# Patient Record
Sex: Female | Born: 1955 | Race: White | Hispanic: No | Marital: Married | Smoking: Never smoker
Health system: Southern US, Community
[De-identification: ages and names within clinical notes are randomized; demographics above are authoritative.]

## PROBLEM LIST (undated history)

## (undated) DIAGNOSIS — R5383 Other fatigue: Secondary | ICD-10-CM

## (undated) DIAGNOSIS — K649 Unspecified hemorrhoids: Secondary | ICD-10-CM

## (undated) DIAGNOSIS — I10 Essential (primary) hypertension: Secondary | ICD-10-CM

## (undated) DIAGNOSIS — N189 Chronic kidney disease, unspecified: Secondary | ICD-10-CM

## (undated) DIAGNOSIS — K6289 Other specified diseases of anus and rectum: Secondary | ICD-10-CM

## (undated) DIAGNOSIS — D649 Anemia, unspecified: Secondary | ICD-10-CM

## (undated) DIAGNOSIS — K219 Gastro-esophageal reflux disease without esophagitis: Secondary | ICD-10-CM

## (undated) DIAGNOSIS — K449 Diaphragmatic hernia without obstruction or gangrene: Secondary | ICD-10-CM

## (undated) DIAGNOSIS — J189 Pneumonia, unspecified organism: Secondary | ICD-10-CM

## (undated) DIAGNOSIS — I773 Arterial fibromuscular dysplasia: Secondary | ICD-10-CM

## (undated) DIAGNOSIS — I729 Aneurysm of unspecified site: Secondary | ICD-10-CM

## (undated) DIAGNOSIS — T783XXA Angioneurotic edema, initial encounter: Secondary | ICD-10-CM

## (undated) DIAGNOSIS — M1990 Unspecified osteoarthritis, unspecified site: Secondary | ICD-10-CM

## (undated) DIAGNOSIS — C801 Malignant (primary) neoplasm, unspecified: Secondary | ICD-10-CM

## (undated) DIAGNOSIS — G629 Polyneuropathy, unspecified: Secondary | ICD-10-CM

## (undated) DIAGNOSIS — R51 Headache: Secondary | ICD-10-CM

## (undated) DIAGNOSIS — R002 Palpitations: Secondary | ICD-10-CM

## (undated) DIAGNOSIS — R519 Headache, unspecified: Secondary | ICD-10-CM

## (undated) DIAGNOSIS — K625 Hemorrhage of anus and rectum: Secondary | ICD-10-CM

## (undated) HISTORY — DX: Gastro-esophageal reflux disease without esophagitis: K21.9

## (undated) HISTORY — DX: Anemia, unspecified: D64.9

## (undated) HISTORY — DX: Other fatigue: R53.83

## (undated) HISTORY — DX: Unspecified hemorrhoids: K64.9

## (undated) HISTORY — DX: Angioneurotic edema, initial encounter: T78.3XXA

## (undated) HISTORY — DX: Headache, unspecified: R51.9

## (undated) HISTORY — DX: Unspecified osteoarthritis, unspecified site: M19.90

## (undated) HISTORY — DX: Palpitations: R00.2

## (undated) HISTORY — DX: Arterial fibromuscular dysplasia: I77.3

## (undated) HISTORY — DX: Diaphragmatic hernia without obstruction or gangrene: K44.9

## (undated) HISTORY — DX: Headache: R51

## (undated) HISTORY — DX: Malignant (primary) neoplasm, unspecified: C80.1

## (undated) HISTORY — DX: Essential (primary) hypertension: I10

## (undated) HISTORY — DX: Hemorrhage of anus and rectum: K62.5

## (undated) HISTORY — PX: OTHER SURGICAL HISTORY: SHX169

## (undated) HISTORY — DX: Other specified diseases of anus and rectum: K62.89

---

## 1993-11-01 HISTORY — PX: TUBAL LIGATION: SHX77

## 2002-07-30 DIAGNOSIS — M4120 Other idiopathic scoliosis, site unspecified: Secondary | ICD-10-CM | POA: Insufficient documentation

## 2002-07-30 DIAGNOSIS — M719 Bursopathy, unspecified: Secondary | ICD-10-CM | POA: Insufficient documentation

## 2002-07-30 HISTORY — DX: Other idiopathic scoliosis, site unspecified: M41.20

## 2003-07-17 DIAGNOSIS — E1142 Type 2 diabetes mellitus with diabetic polyneuropathy: Secondary | ICD-10-CM | POA: Insufficient documentation

## 2003-07-17 HISTORY — DX: Type 2 diabetes mellitus with diabetic polyneuropathy: E11.42

## 2003-12-23 DIAGNOSIS — G609 Hereditary and idiopathic neuropathy, unspecified: Secondary | ICD-10-CM | POA: Insufficient documentation

## 2004-12-30 ENCOUNTER — Ambulatory Visit: Payer: Self-pay | Admitting: Ophthalmology

## 2006-02-17 ENCOUNTER — Ambulatory Visit: Payer: Self-pay | Admitting: Unknown Physician Specialty

## 2007-02-20 ENCOUNTER — Ambulatory Visit: Payer: Self-pay | Admitting: Family Medicine

## 2007-03-23 ENCOUNTER — Ambulatory Visit: Payer: Self-pay | Admitting: Ophthalmology

## 2007-03-29 ENCOUNTER — Ambulatory Visit: Payer: Self-pay | Admitting: Ophthalmology

## 2008-03-01 DIAGNOSIS — G4700 Insomnia, unspecified: Secondary | ICD-10-CM | POA: Insufficient documentation

## 2008-03-06 DIAGNOSIS — E538 Deficiency of other specified B group vitamins: Secondary | ICD-10-CM | POA: Insufficient documentation

## 2008-03-06 DIAGNOSIS — E559 Vitamin D deficiency, unspecified: Secondary | ICD-10-CM | POA: Insufficient documentation

## 2008-03-06 DIAGNOSIS — D509 Iron deficiency anemia, unspecified: Secondary | ICD-10-CM | POA: Insufficient documentation

## 2008-03-20 ENCOUNTER — Ambulatory Visit: Payer: Self-pay | Admitting: Family Medicine

## 2008-11-01 HISTORY — PX: OTHER SURGICAL HISTORY: SHX169

## 2009-07-11 ENCOUNTER — Ambulatory Visit: Payer: Self-pay | Admitting: Family Medicine

## 2009-08-05 ENCOUNTER — Encounter: Payer: Self-pay | Admitting: Cardiovascular Disease

## 2010-04-17 DIAGNOSIS — R202 Paresthesia of skin: Secondary | ICD-10-CM | POA: Insufficient documentation

## 2010-04-22 ENCOUNTER — Ambulatory Visit: Payer: Self-pay | Admitting: Family Medicine

## 2010-04-23 DIAGNOSIS — I809 Phlebitis and thrombophlebitis of unspecified site: Secondary | ICD-10-CM | POA: Insufficient documentation

## 2010-08-28 ENCOUNTER — Encounter: Payer: Self-pay | Admitting: Cardiovascular Disease

## 2010-08-28 LAB — HM PAP SMEAR: HM PAP: NEGATIVE

## 2010-10-06 ENCOUNTER — Ambulatory Visit: Payer: Self-pay | Admitting: Family Medicine

## 2010-12-04 ENCOUNTER — Ambulatory Visit (INDEPENDENT_AMBULATORY_CARE_PROVIDER_SITE_OTHER): Payer: 59 | Admitting: Cardiovascular Disease

## 2010-12-04 ENCOUNTER — Encounter: Payer: Self-pay | Admitting: Cardiovascular Disease

## 2010-12-04 ENCOUNTER — Ambulatory Visit: Admit: 2010-12-04 | Payer: Self-pay | Admitting: Cardiovascular Disease

## 2010-12-04 DIAGNOSIS — R0602 Shortness of breath: Secondary | ICD-10-CM

## 2010-12-04 DIAGNOSIS — E785 Hyperlipidemia, unspecified: Secondary | ICD-10-CM

## 2010-12-04 DIAGNOSIS — R9431 Abnormal electrocardiogram [ECG] [EKG]: Secondary | ICD-10-CM | POA: Insufficient documentation

## 2010-12-04 DIAGNOSIS — R002 Palpitations: Secondary | ICD-10-CM | POA: Insufficient documentation

## 2010-12-09 NOTE — Assessment & Plan Note (Signed)
Summary: [facility]   Visit Type:  Initial Consult Primary Provider:  Dr. Lorie Phenix  CC:  Self referral. c/o palpitations and SOB. 3 abnormal EKGs. Denies chest pain. Marland Kitchen  History of Present Illness: [name] is a very pleasant 55 year old woman, patient of Dr. Elease Hashimoto, with mild obesity, diabetes type 2 for the past 10 years, history of abnormal EKG, palpitations and tachycardia who presents for evaluation of palpitations and abnormal EKG. she reports that her palpitations have been more bothersome recently and she is also concerned about her family history of peripheral vascular disease and wanted to be evaluated.  She states that her mother has died from congestive heart failure in her 10s. Father is alive in his 63s and recently had carotid disease and lower extremity arterial disease procedures. She is very nervous about having disease herself.  She is active, walks her dog who is blind and also walked with her husband. Her weight has been increasing and is up 20 pounds from her best recent weight. Her sugar numbers have been well controlled with hemoglobin A1c's in the 6.3-6.8 range per her report. She is not number her cholesterol though has a triglyceride in the 170s she believes. She has been told in the past that her cholesterol has been adequate.  She has a remote history of palpitations. Workup including EKG where she was told this was abnormal. She had a stress test in 2003 that was normal showing no ischemia. She continues to have periods at night time when her heart beat hard with rare palpitations. She has very mild shortness of breath periodically with walking.  EKG today shows normal sinus rhythm with rate 66 beats per minute with poor R-wave progression through the precordial leads otherwise no significant ST or T wave changes  Preventive Screening-Counseling & Management  Alcohol-Tobacco     Smoking Status: never  Caffeine-Diet-Exercise     Does Patient  Exercise: yes      Drug Use:  no.    Current Medications (verified): 1)  Vitamin B-12 Injection .... Monthly 2)  Vitamin D 1000 Unit Tabs (Cholecalciferol) .Marland Kitchen.. 1 Tablet Once Daily 3)  Ferrocite 324 Mg Tabs (Ferrous Fumarate) .Marland Kitchen.. 1 Tablet Two Times A Day 4)  Fish Oil 1200 Mg Caps (Omega-3 Fatty Acids) .Marland Kitchen.. 1 Tablet Once Daily 5)  Actos 45 Mg Tabs (Pioglitazone Hcl) .Marland Kitchen.. 1 Tablet Daily 6)  Lyrica 100 Mg Caps (Pregabalin) .Marland Kitchen.. 1 Tablet Three Times A Day 7)  Metoprolol Succinate 25 Mg Xr24h-Tab (Metoprolol Succinate) .Marland Kitchen.. 1 Tablet Once Daily 8)  Lisinopril-Hydrochlorothiazide 20-12.5 Mg Tabs (Lisinopril-Hydrochlorothiazide) .Marland Kitchen.. 1 Tablet Once Daily 9)  Zolpidem Tartrate 5 Mg Tabs (Zolpidem Tartrate) .Marland Kitchen.. 1 Tablet At Bedtime As Needed 10)  Zegerid 40-1100 Mg Caps (Omeprazole-Sodium Bicarbonate) .Marland Kitchen.. 1 Tablet Once Daily 11)  Aspir-Low 81 Mg Tbec (Aspirin) .Marland Kitchen.. 1 Tablet Once Daily  Allergies (verified): No Known Drug Allergies  Past History:  Family History: Last updated: 12/04/2010 Father: 82-carotid and leg blockages-living Mother: 67-CHF  Social History: Last updated: 12/04/2010 Full Time Married  Tobacco Use - No.  Alcohol Use - no Regular Exercise - yes-walking Drug Use - no  Risk Factors: Exercise: yes (12/04/2010)  Risk Factors: Smoking Status: never (12/04/2010)  Past Medical History: Anemia  Fatigue Hiatal Hernia GERD Diabetes  Past Surgical History: Tubal ligation right and left eye cataract surgery  Family History: Father: 82-carotid and leg blockages-living Mother: 67-CHF  Social History: Full Time Married  Tobacco Use - No.  Alcohol Use -  no Regular Exercise - yes-walking Drug Use - no Smoking Status:  never Does Patient Exercise:  yes Drug Use:  no  Review of Systems  The patient denies fever, weight loss, weight gain, vision loss, decreased hearing, hoarseness, chest pain, syncope, dyspnea on exertion, peripheral edema, prolonged cough,  abdominal pain, incontinence, muscle weakness, depression, and enlarged lymph nodes.         palpitations, rare SOB with exertion, sometimes at rest, "hard beats"  Vital Signs:  Patient profile:   55 year old female Height:      68 inches Weight:      230.50 pounds BMI:     35.17 Pulse rate:   66 / minute BP sitting:   128 / 75  (left arm) Cuff size:   large  Vitals Entered By: Lysbeth Galas CMA (December 04, 2010 11:06 AM)  Physical Exam  General:  Well developed, well nourished, in no acute distress. Mildly obese Head:  normocephalic and atraumatic Neck:  Neck supple, no JVD. No masses, thyromegaly or abnormal cervical nodes. Lungs:  Clear bilaterally to auscultation and percussion. Heart:  Non-displaced PMI, chest non-tender; regular rate and rhythm, S1, S2 without murmurs, rubs or gallops. Carotid upstroke normal, no bruit. Pedals normal pulses. No edema, no varicosities. Abdomen:  Bowel sounds positive; abdomen soft and non-tender without masses Msk:  Back normal, normal gait. Muscle strength and tone normal. Pulses:  pulses normal in all 4 extremities Extremities:  No clubbing or cyanosis. Neurologic:  Alert and oriented x 3. Skin:  Intact without lesions or rashes. Psych:  Normal affect.   Impression & Recommendations:  Problem # 1:  ABNORMAL EKG (ICD-794.31) she does have subtle changes on her EKG though essentially it is normal. She did have a stress test in 2003 given her abnormal EKG and this was normal. She is relatively asymptomatic. No further testing needed. We do not need to repeat a stress test. .   Her updated medication list for this problem includes:    Metoprolol Succinate 25 Mg Xr24h-tab (Metoprolol succinate) .Marland Kitchen... 1 tablet once daily    Lisinopril-hydrochlorothiazide 20-12.5 Mg Tabs (Lisinopril-hydrochlorothiazide) .Marland Kitchen... 1 tablet once daily    Aspir-low 81 Mg Tbec (Aspirin) .Marland Kitchen... 1 tablet once daily  Problem # 2:  PALPITATIONS (ICD-785.1) We did  discuss her palpitations with her. This is likely either PVCs or APCs. She is on a beta blocker. She is not particular he interested in increasing the dose but I did suggest that she could take an additional half dose p.r.n. for breakthrough palpitations. If she starts to have more episodes that are more bothersome, we could order a Holter monitor.  Her updated medication list for this problem includes:    Metoprolol Succinate 25 Mg Xr24h-tab (Metoprolol succinate) .Marland Kitchen... 1 tablet once daily    Lisinopril-hydrochlorothiazide 20-12.5 Mg Tabs (Lisinopril-hydrochlorothiazide) .Marland Kitchen... 1 tablet once daily    Aspir-low 81 Mg Tbec (Aspirin) .Marland Kitchen... 1 tablet once daily  Problem # 3:  HYPERLIPIDEMIA-MIXED (ICD-272.4) As she is a diabetic, and has a family history on her mother's side of congestive heart failure, we will closely at her cholesterol. Ideally LDL should be less than 100. We have encouraged her to exercise, decrease her weight.  Problem # 4:  DYSPNEA (ICD-786.05) She does have some very mild shortness of breath sometimes with exertion. This could be secondary to deconditioning. Clinical exam is essentially benign with no signs of heart failure. It is sporadic likely not secondary to underlying ischemia. We have suggested  she increase her exercise and conditioning.  Her updated medication list for this problem includes:    Metoprolol Succinate 25 Mg Xr24h-tab (Metoprolol succinate) .Marland Kitchen... 1 tablet once daily    Lisinopril-hydrochlorothiazide 20-12.5 Mg Tabs (Lisinopril-hydrochlorothiazide) .Marland Kitchen... 1 tablet once daily    Aspir-low 81 Mg Tbec (Aspirin) .Marland Kitchen... 1 tablet once daily  Other Orders: EKG w/ Interpretation (93000)

## 2010-12-17 NOTE — Letter (Signed)
Summary: Medical Record Release  Medical Record Release   Imported By: Harlon Flor 12/11/2010 16:13:06  _____________________________________________________________________  External Attachment:    Type:   Image     Comment:   External Document

## 2011-01-12 NOTE — Letter (Signed)
Summary: [facility] Office Visit Note   [facility] Office Visit Note   Imported By: Roderic Ovens 01/04/2011 15:14:24  _____________________________________________________________________  External Attachment:    Type:   Image     Comment:   External Document

## 2011-02-01 ENCOUNTER — Encounter: Payer: Self-pay | Admitting: Cardiovascular Disease

## 2011-05-18 ENCOUNTER — Encounter: Payer: Self-pay | Admitting: Cardiovascular Disease

## 2011-10-25 ENCOUNTER — Ambulatory Visit: Payer: Self-pay | Admitting: Family Medicine

## 2011-10-25 LAB — HM MAMMOGRAPHY

## 2012-02-25 ENCOUNTER — Encounter (INDEPENDENT_AMBULATORY_CARE_PROVIDER_SITE_OTHER): Payer: Self-pay | Admitting: General Surgery

## 2012-02-25 ENCOUNTER — Ambulatory Visit (INDEPENDENT_AMBULATORY_CARE_PROVIDER_SITE_OTHER): Payer: 59 | Admitting: General Surgery

## 2012-02-25 VITALS — BP 118/66 | HR 70 | Temp 96.9°F | Resp 18 | Ht 68.0 in | Wt 246.5 lb

## 2012-02-25 DIAGNOSIS — K648 Other hemorrhoids: Secondary | ICD-10-CM

## 2012-02-25 NOTE — Patient Instructions (Signed)

## 2012-02-25 NOTE — Progress Notes (Signed)
Patient ID: Melody Brooks, female   DOB: 1956/06/19, 56 y.o.   MRN: [id number]  Chief Complaint  Patient presents with  . Rectal Bleeding  . Pain    HPI Melody Brooks is a 56 y.o. female.  Self referred HPI This is a 56 year old female who had a colonoscopy a couple of years ago was noted to have a polyp that appeared around her anus. Her remainder of her colonoscopy apparently was normal as she was recommended to follow up in 10 years. She had a tagged it eventually was removed by her dermatologist. This took a while to heal. Since then she's had intermittent bleeding that has been noted to be worse with hard stools. She also also had some discomfort in her rectum. She denies any itching. She denies any real masses that she can feel in this region either. She comes in today and she thinks that his hearing is not healed and she continues to have the bright red blood.  Past Medical History  Diagnosis Date  . Anemia   . Fatigue   . Hiatal hernia   . GERD (gastroesophageal reflux disease)   . Diabetes mellitus   . Arthritis   . Hypertension   . Constipation   . Palpitations   . Rectal bleeding   . Rectal pain   . Generalized headaches     Past Surgical History  Procedure Date  . Right and left eye cataract surgery 2006, 2008    right in 2006 and left 2008  . Tubal ligation 1995  . Colonoscopy 2010    Family History  Problem Relation Age of Onset  . Heart failure Mother   . Cancer Father     prostate  . Cancer Maternal Aunt     colon  . Cancer Paternal Grandmother     stomach    Social History History  Substance Use Topics  . Smoking status: Never Smoker   . Smokeless tobacco: Never Used   Comment: tobacco use- no  . Alcohol Use: No    No Known Allergies  Current Outpatient Prescriptions  Medication Sig Dispense Refill  . aspirin 81 MG tablet Take 81 mg by mouth daily.      Marland Kitchen LORazepam (ATIVAN) 2 MG tablet Take 2 mg by mouth at bedtime as needed.      .  metoprolol tartrate (LOPRESSOR) 25 MG tablet Take 25 mg by mouth 2 (two) times daily.      . sitaGLIPtan-metformin (JANUMET) 50-1000 MG per tablet Take 1 tablet by mouth 2 (two) times daily with a meal.      . cholecalciferol (VITAMIN D) 1000 UNITS tablet Take 1,000 Units by mouth daily.        . Cyanocobalamin (VITAMIN B-12 IJ) Inject as directed every 30 (thirty) days.        . ferrous fumarate (FERROCITE) 325 (106 FE) MG TABS Take 1 tablet by mouth 2 (two) times daily.        Marland Kitchen lisinopril-hydrochlorothiazide (PRINZIDE,ZESTORETIC) 20-12.5 MG per tablet Take 1 tablet by mouth daily.        . Omega-3 Fatty Acids (FISH OIL) 1200 MG CAPS Take by mouth daily.        Marland Kitchen omeprazole-sodium bicarbonate (ZEGERID) 40-1100 MG per capsule Take 1 capsule by mouth daily.        . pioglitazone (ACTOS) 45 MG tablet Take 45 mg by mouth daily.        . pregabalin (LYRICA) 100 MG capsule Take 100 mg by  mouth 3 (three) times daily.        Marland Kitchen zolpidem (AMBIEN) 5 MG tablet Take 5 mg by mouth at bedtime as needed.          Review of Systems Review of Systems  Constitutional: Negative for fever, chills and unexpected weight change.  HENT: Negative for hearing loss, congestion, sore throat, trouble swallowing and voice change.   Eyes: Negative for visual disturbance.  Respiratory: Negative for cough and wheezing.   Cardiovascular: Positive for palpitations and leg swelling. Negative for chest pain.  Gastrointestinal: Positive for constipation, anal bleeding and rectal pain. Negative for nausea, vomiting, abdominal pain, diarrhea, blood in stool and abdominal distention.  Genitourinary: Negative for hematuria, vaginal bleeding and difficulty urinating.  Musculoskeletal: Negative for arthralgias.  Skin: Negative for rash and wound.  Neurological: Positive for headaches. Negative for seizures and syncope.  Hematological: Negative for adenopathy. Does not bruise/bleed easily.  Psychiatric/Behavioral: Negative for  confusion.    Blood pressure 118/66, pulse 70, temperature 96.9 F (36.1 C), temperature source Temporal, resp. rate 18, height 5\' 8"  (1.727 m), weight 246 lb 8 oz (111.812 kg).  Physical Exam Physical Exam  Vitals reviewed. Constitutional: She appears well-developed and well-nourished.  Genitourinary: Rectal exam shows internal hemorrhoid (moderate hemorrhoids in all 3 positions, no stigmata of recent bleeding today). Rectal exam shows no external hemorrhoid, no fissure, no mass, no tenderness and anal tone normal.      Assessment    Internal hemorrhoids with bleeding    Plan    I don't think this is related to her prior biopsy she had. I think she has internal hemorrhoids as the source. We discussed conservative measures. We discussed possible interventions like banding. We're going to give these conservative measures the next 6 weeks and I will follow up after that. She may very well need another colonoscopy sooner than a planned 10 years also.       Eidan Muellner 02/25/2012, 10:12 AM

## 2012-03-01 HISTORY — PX: EXCISIONAL HEMORRHOIDECTOMY: SHX1541

## 2012-03-13 ENCOUNTER — Ambulatory Visit: Payer: 59 | Admitting: Cardiovascular Disease

## 2012-04-03 ENCOUNTER — Ambulatory Visit: Payer: Self-pay | Admitting: Family Medicine

## 2012-04-07 ENCOUNTER — Ambulatory Visit (INDEPENDENT_AMBULATORY_CARE_PROVIDER_SITE_OTHER): Payer: 59 | Admitting: General Surgery

## 2012-04-07 ENCOUNTER — Encounter (INDEPENDENT_AMBULATORY_CARE_PROVIDER_SITE_OTHER): Payer: Self-pay | Admitting: General Surgery

## 2012-04-07 VITALS — BP 112/72 | HR 77 | Temp 97.0°F | Resp 16 | Ht 68.0 in | Wt 242.4 lb

## 2012-04-07 DIAGNOSIS — K648 Other hemorrhoids: Secondary | ICD-10-CM

## 2012-04-07 NOTE — Patient Instructions (Signed)
Hemorrhoid Banding Hemorrhoids are veins in the anus and lower rectum that become enlarged. The most common symptoms are rectal bleeding, itching, and sometimes pain. Hemorrhoids might come out with straining or having a bowel movement, and they can sometimes be pushed back in. There are internal and external hemorrhoids. Only internal hemorrhoids can be treated with banding. In this procedure, a rubber band is placed near the hemorrhoid tissue, cutting off the blood supply. This procedure prevents the hemorrhoids from slipping down. LET YOUR CAREGIVER KNOW ABOUT: All medicines you are taking, especially blood thinners such as aspirin and coumadin.  RISKS AND COMPLICATIONS This is not a painful procedure, but if you do have intense pain immediately let your surgeon know because the band may need to be removed. You may have some mild pain or discomfort in the first 2 days or so after treatment. Sometimes there may be delayed bleeding in the first week after treatment.  BEFORE THE PROCEDURE  There is no special preparation needed before banding. Your surgeon may have you do an enema prior to the procedure. You will go home the same day.  HOME CARE INSTRUCTIONS   Your surgeon might instruct you to do sitz baths as needed if you have discomfort or after a bowel movement.   You may be instructed to use fiber supplements.  SEEK MEDICAL CARE IF:  You have an increase in pain.   Your pain does not get better.  SEEK IMMEDIATE MEDICAL CARE IF:  You have intense pain.   Fever greater than 100.5 F (38.1 C).   Bleeding that does not stop, or pus from the anus.  Document Released: 08/15/2009 Document Revised: 10/07/2011 Document Reviewed: 08/15/2009 [employer] Patient Information 2012 [employer]. 

## 2012-04-07 NOTE — Progress Notes (Signed)
Subjective:     Patient ID: Little Ishikawa, female   DOB: 1956/05/05, 56 y.o.   MRN: [id number]  HPI 56 yof with brbpr I saw several weeks ago.  She had prior colonoscopy in last couple years that was fairly normal.  We discussed conservative measures last time which she has employed.  She has done well since our last visit except for one episode that lasted three days over a long weekend when she was travelling.  She was not eating normally or using bathroom normally.  She had brb noted in the water after bms.  She has had no other problems. She very much wants to try banding today to prevent this from happening again.  Review of Systems     Objective:   Physical Exam No external hemorrhoidal disease Anoscopy shows small internal hemorrhoids except for posteriorly where there is single internal hemorrhoid that appears to have bled recently, I placed 2 bands on this complex    Assessment:     Bleeding internal hemorrhoids    Plan:     We discussed banding and risks of bleeding, infection, minor discomfort as well as possibly needing further procedures or failure of banding.   I will see her back in 6 weeks if she still continues to have problems.  If she still has bleeding I may also send her to get repeat colonoscopy

## 2012-05-26 ENCOUNTER — Encounter (INDEPENDENT_AMBULATORY_CARE_PROVIDER_SITE_OTHER): Payer: 59 | Admitting: General Surgery

## 2012-06-23 ENCOUNTER — Ambulatory Visit (INDEPENDENT_AMBULATORY_CARE_PROVIDER_SITE_OTHER): Payer: 59 | Admitting: General Surgery

## 2012-06-23 ENCOUNTER — Encounter (INDEPENDENT_AMBULATORY_CARE_PROVIDER_SITE_OTHER): Payer: Self-pay | Admitting: General Surgery

## 2012-06-23 VITALS — BP 130/74 | HR 70 | Temp 97.2°F | Resp 16 | Ht 68.0 in | Wt 243.0 lb

## 2012-06-23 DIAGNOSIS — K648 Other hemorrhoids: Secondary | ICD-10-CM

## 2012-06-23 NOTE — Progress Notes (Signed)
Subjective:     Patient ID: Melody Brooks, female   DOB: Sep 13, 1956, 56 y.o.   MRN: [id number]  HPI This is a 40 her old female who has a history of a colonoscopy which may be now 5 years there was a fairly normal. This was done in [place] I don't have these records. I see her couple times now for what appeared to be hemorrhoids. Last time I saw her she clearly had some bleeding hemorrhoids I placed bands on. She did well for several weeks and then since then has had some intermittent bright red blood which is very clearly related to some constipation. She is passing large clots. She's not had any blood out since this weekend.  Review of Systems     Objective:   Physical Exam On anoscopy today she has some small internal hemorrhoids but there is nothing really that is actively bleeding or inflamed. She has some small external tags as well.    Assessment:     Internal hemorrhoids   Blood per rectum    Plan:     I think the most likely her symptoms are coming from her hemorrhoids. We discussed MiraLAX in addition to all the other measures we have been taking. We'll plan on just following her and see her back in a couple months for her hemorrhoids. I'm also going to refer her to gastroenterology for a colonoscopy just to make sure the remainder of her colon is fine with the symptoms as she is currently having.

## 2012-06-27 ENCOUNTER — Telehealth (INDEPENDENT_AMBULATORY_CARE_PROVIDER_SITE_OTHER): Payer: Self-pay

## 2012-06-27 NOTE — Telephone Encounter (Signed)
LMOM notifying the pt of her appt with Dr Bosie Clos for 07/14/12 arrive at 10:00 for 10:15. Pt adv. To bring photo ID, ins.card,and list of meds. I will fax notes to Dr Bosie Clos [phone]. I adv. Pt that if she couldn't make this appt to call [facility] to r/s the appt.

## 2012-07-14 ENCOUNTER — Telehealth (INDEPENDENT_AMBULATORY_CARE_PROVIDER_SITE_OTHER): Payer: Self-pay

## 2012-07-14 NOTE — Telephone Encounter (Signed)
Angel from Dr [employer] office called stating pt cx appt for today due to husband having heart surgery.

## 2012-08-18 ENCOUNTER — Encounter (INDEPENDENT_AMBULATORY_CARE_PROVIDER_SITE_OTHER): Payer: 59 | Admitting: General Surgery

## 2012-09-26 LAB — HM COLONOSCOPY

## 2013-05-24 DIAGNOSIS — T783XXA Angioneurotic edema, initial encounter: Secondary | ICD-10-CM | POA: Insufficient documentation

## 2013-07-03 ENCOUNTER — Encounter (INDEPENDENT_AMBULATORY_CARE_PROVIDER_SITE_OTHER): Payer: Self-pay | Admitting: General Surgery

## 2013-07-03 ENCOUNTER — Ambulatory Visit (INDEPENDENT_AMBULATORY_CARE_PROVIDER_SITE_OTHER): Payer: 59 | Admitting: General Surgery

## 2013-07-03 VITALS — BP 120/68 | HR 96 | Temp 97.2°F | Ht 68.0 in | Wt 245.2 lb

## 2013-07-03 DIAGNOSIS — K644 Residual hemorrhoidal skin tags: Secondary | ICD-10-CM

## 2013-07-03 MED ORDER — LIDOCAINE HCL 2 % EX GEL: CUTANEOUS | Status: AC

## 2013-07-03 MED ORDER — HYDROCORTISONE ACETATE 25 MG RE SUPP: 25.0000 mg | Freq: Two times a day (BID) | RECTAL | Status: DC

## 2013-07-03 NOTE — Progress Notes (Signed)
Subjective:     Patient ID: Melody Brooks, female   DOB: 1956-06-12, 57 y.o.   MRN: [id number]  HPI   Review of Systems  Constitutional: Negative.   HENT: Negative.   Respiratory: Negative.   Cardiovascular: Negative.   Gastrointestinal: Positive for anal bleeding and rectal pain.  Musculoskeletal: Negative.   Neurological: Negative.   All other systems reviewed and are negative.       Objective:   Physical Exam  Constitutional: She is oriented to person, place, and time. She appears well-developed and well-nourished.  HENT:  Head: Normocephalic and atraumatic.  Eyes: Conjunctivae and EOM are normal. Pupils are equal, round, and reactive to light.  Neck: Normal range of motion. Neck supple.  Cardiovascular: Normal rate, regular rhythm and normal heart sounds.   Pulmonary/Chest: Effort normal and breath sounds normal.  Abdominal: Soft.  Genitourinary:     1 external hemorrhoid, nonthrombosed  Musculoskeletal: Normal range of motion.  Neurological: She is alert and oriented to person, place, and time.       Assessment:     The patient is a 57 year old female with a resolving thrombosed external hemorrhoid     Plan:     1. I will give the patient a prescription for lidocaine jelly, and Anusol suppositories.  2. I discussed with her the need to increase her fiber intake, avoid straining. 3. The patient follow up as needed

## 2013-07-09 ENCOUNTER — Ambulatory Visit: Payer: Self-pay | Admitting: Physical Medicine and Rehabilitation

## 2013-07-31 ENCOUNTER — Encounter (INDEPENDENT_AMBULATORY_CARE_PROVIDER_SITE_OTHER): Payer: Self-pay

## 2014-12-11 DIAGNOSIS — M5417 Radiculopathy, lumbosacral region: Secondary | ICD-10-CM

## 2014-12-11 HISTORY — DX: Radiculopathy, lumbosacral region: M54.17

## 2014-12-12 DIAGNOSIS — M7060 Trochanteric bursitis, unspecified hip: Secondary | ICD-10-CM | POA: Insufficient documentation

## 2014-12-20 LAB — HEPATIC FUNCTION PANEL
ALT: 22 U/L (ref 7–35)
AST: 20 U/L (ref 13–35)

## 2014-12-20 LAB — CBC AND DIFFERENTIAL
HEMATOCRIT: 40 % (ref 36–46)
Hemoglobin: 13.2 g/dL (ref 12.0–16.0)
Platelets: 288 10*3/uL (ref 150–399)
WBC: 6.7 10*3/mL

## 2014-12-20 LAB — BASIC METABOLIC PANEL
BUN: 19 mg/dL (ref 4–21)
Creatinine: 1 mg/dL (ref 0.5–1.1)
GLUCOSE: 127 mg/dL
Potassium: 4.2 mmol/L (ref 3.4–5.3)
Sodium: 141 mmol/L (ref 137–147)

## 2015-03-12 ENCOUNTER — Ambulatory Visit (INDEPENDENT_AMBULATORY_CARE_PROVIDER_SITE_OTHER): Payer: 59 | Admitting: Cardiovascular Disease

## 2015-03-12 ENCOUNTER — Encounter (INDEPENDENT_AMBULATORY_CARE_PROVIDER_SITE_OTHER): Payer: Self-pay

## 2015-03-12 ENCOUNTER — Encounter: Payer: Self-pay | Admitting: Cardiovascular Disease

## 2015-03-12 VITALS — BP 122/72 | HR 78 | Ht 68.0 in | Wt 246.2 lb

## 2015-03-12 DIAGNOSIS — R0789 Other chest pain: Secondary | ICD-10-CM

## 2015-03-12 DIAGNOSIS — M79602 Pain in left arm: Secondary | ICD-10-CM

## 2015-03-12 DIAGNOSIS — I10 Essential (primary) hypertension: Secondary | ICD-10-CM | POA: Insufficient documentation

## 2015-03-12 DIAGNOSIS — R002 Palpitations: Secondary | ICD-10-CM

## 2015-03-12 DIAGNOSIS — R072 Precordial pain: Secondary | ICD-10-CM

## 2015-03-12 DIAGNOSIS — R9431 Abnormal electrocardiogram [ECG] [EKG]: Secondary | ICD-10-CM | POA: Diagnosis not present

## 2015-03-12 DIAGNOSIS — E669 Obesity, unspecified: Secondary | ICD-10-CM | POA: Insufficient documentation

## 2015-03-12 DIAGNOSIS — R079 Chest pain, unspecified: Secondary | ICD-10-CM | POA: Insufficient documentation

## 2015-03-12 NOTE — Assessment & Plan Note (Signed)
Previously on metoprolol, reports having angioedema on lisinopril, now on losartan and HCTZ. Blood pressure stable

## 2015-03-12 NOTE — Assessment & Plan Note (Signed)
Symptomatic palpitations likely APCs or PVCs. Tachycardia possibly from atrial tachycardia. Currently not on metoprolol as she reports this decreased her blood pressure. Could potentially use propranolol as needed if symptoms get severe.

## 2015-03-12 NOTE — Assessment & Plan Note (Signed)
We have encouraged continued exercise, careful diet management in an effort to lose weight. 

## 2015-03-12 NOTE — Assessment & Plan Note (Signed)
Left arm pain is atypical in nature, squeezing in the biceps area option at rest, possibly exacerbated with stress at work. Coronary calcium score ordered for risk stratification

## 2015-03-12 NOTE — Assessment & Plan Note (Signed)
She is concerned about recent chest pain symptoms and left arm pain. Symptoms are somewhat atypical. We have offered treadmill testing, treadmill echo, Myoview, and CT coronary calcium score She prefers coronary calcium score. Details discussed with her about the test, results. If she has underlying calcified plaque, this scan will demonstrate her disease. A low score on the scan will likely provide reassurance that symptoms are atypical in nature.

## 2015-03-12 NOTE — Progress Notes (Signed)
Patient ID: Melody Brooks, female    DOB: [birth year], 59 y.o.   MRN: [id number]  HPI Comments: Melody Brooks is a very pleasant 59 year-old woman, patient of Dr. Venia Minks, with mild obesity, diabetes type 2, history of abnormal EKG, palpitations and tachycardia who was previously seen in 2012 for  palpitations and abnormal EKG. she has a family history of peripheral vascular disease   She is reports having 6-7 months of left arm pain in her biceps area rarely extending up to her shoulder. Symptoms will present at rest, sometimes with exertion, sometimes at work and sometimes at home. Feels like a squeezing. No other associated symptoms such as diaphoresis, chest pain. She noticed significant symptoms last week at work when she was under more stress  She also has been having symptomatic palpitations. Rarely has tachycardia heart rate 100 up to 110 bpm lasting for short periods of time. She was previously on metoprolol. This was changed reporting that her blood pressure was running low.  She also reports having some pain in the center of her chest. Typically she rubs the mediastinal area when she has a symptoms. Symptoms not typically associated with exertion.  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  EKG shows normal sinus rhythm with rate 78 bpm, nonspecific T wave abnormality through the anterior precordial leads Lab work reviewed with her showing total cholesterol 146, LDL 68  Other past medical history  mother has died from congestive heart failure in her 79s. Father is in his 69s and  had carotid disease and lower extremity arterial disease procedures.    Allergies  Allergen Reactions  . Lisinopril Swelling    Angioedema    Current Outpatient Prescriptions on File Prior to Visit  Medication Sig Dispense Refill  . aspirin 81 MG tablet Take 81 mg by mouth daily.    . cholecalciferol (VITAMIN D) 1000 UNITS tablet Take 1,000  Units by mouth daily.      . Cyanocobalamin (VITAMIN B-12 IJ) Inject as directed every 30 (thirty) days.      . ferrous fumarate (FERROCITE) 325 (106 FE) MG TABS Take 1 tablet by mouth 2 (two) times daily.      . hydrocortisone (ANUSOL-HC) 25 MG suppository Place 1 suppository (25 mg total) rectally 2 (two) times daily. 12 suppository 0  . JANUMET 50-1000 MG per tablet     . LORazepam (ATIVAN) 2 MG tablet Take 2 mg by mouth at bedtime as needed.    . Omega-3 Fatty Acids (FISH OIL) 1200 MG CAPS Take by mouth daily.      . pioglitazone (ACTOS) 45 MG tablet Take 45 mg by mouth daily.      . pregabalin (LYRICA) 100 MG capsule Take 100 mg by mouth 3 (three) times daily.      Marland Kitchen zolpidem (AMBIEN) 5 MG tablet Take 5 mg by mouth at bedtime as needed.       No current facility-administered medications on file prior to visit.    Past Medical History  Diagnosis Date  . Anemia   . Fatigue   . Hiatal hernia   . GERD (gastroesophageal reflux disease)   . Diabetes mellitus   . Arthritis   . Hypertension   . Constipation   . Palpitations   . Rectal bleeding   . Rectal pain   . Generalized headaches   . Hemorrhoids   . Fibromuscular dysplasia   . Angioedema  Past Surgical History  Procedure Laterality Date  . Right and left eye cataract surgery  2006, 2008    right in 2006 and left 2008  . Tubal ligation  1995  . Colonoscopy  2010    Social History  reports that she has never smoked. She has never used smokeless tobacco. She reports that she does not drink alcohol or use illicit drugs.  Family History family history includes Arthritis in her mother; COPD in her mother; Cancer in her father, maternal aunt, and paternal grandmother; Heart failure in her mother; Hypertension in her mother.   Review of Systems  Constitutional: Negative.   Respiratory: Negative.   Cardiovascular: Negative.   Gastrointestinal: Negative.   Musculoskeletal: Negative.   Skin: Negative.   Neurological:  Negative.   Hematological: Negative.   Psychiatric/Behavioral: Negative.   All other systems reviewed and are negative.   BP 122/72 mmHg  Pulse 78  Ht 5\' 8"  (1.727 m)  Wt 246 lb 4 oz (111.698 kg)  BMI 37.45 kg/m2  Physical Exam  Constitutional: She is oriented to person, place, and time. She appears well-developed and well-nourished.  Obese  HENT:  Head: Normocephalic.  Nose: Nose normal.  Mouth/Throat: Oropharynx is clear and moist.  Eyes: Conjunctivae are normal. Pupils are equal, round, and reactive to light.  Neck: Normal range of motion. Neck supple. No JVD present.  Cardiovascular: Normal rate, regular rhythm, S1 normal, S2 normal, normal heart sounds and intact distal pulses.  Exam reveals no gallop and no friction rub.   No murmur heard. Pulmonary/Chest: Effort normal and breath sounds normal. No respiratory distress. She has no wheezes. She has no rales. She exhibits no tenderness.  Abdominal: Soft. Bowel sounds are normal. She exhibits no distension. There is no tenderness.  Musculoskeletal: Normal range of motion. She exhibits no edema or tenderness.  Lymphadenopathy:    She has no cervical adenopathy.  Neurological: She is alert and oriented to person, place, and time. Coordination normal.  Skin: Skin is warm and dry. No rash noted. No erythema.  Psychiatric: She has a normal mood and affect. Her behavior is normal. Judgment and thought content normal.    Assessment and Plan  Nursing note and vitals reviewed.

## 2015-03-12 NOTE — Patient Instructions (Addendum)
You are doing well. No medication changes were made.  You are scheduled for a coronary calcium score for chest pain, shortness of breath, arm pain Tuesday, May 17 @ 9:30, please arrive @ 9:15 There is a one time fee of $150.00 due at the time of your procedure  Please call us if you have new issues that need to be addressed before your next appt.  Your physician wants you to follow-up in: 12 months.  You will receive a reminder letter in the mail two months in advance. If you don't receive a letter, please call our office to schedule the follow-up appointment.

## 2015-03-18 ENCOUNTER — Ambulatory Visit (INDEPENDENT_AMBULATORY_CARE_PROVIDER_SITE_OTHER)
Admission: RE | Admit: 2015-03-18 | Discharge: 2015-03-18 | Disposition: A | Discharge status: Home / Self Care | Payer: Self-pay | Source: Ambulatory Visit | Attending: Cardiovascular Disease | Admitting: Cardiovascular Disease

## 2015-03-18 DIAGNOSIS — R0789 Other chest pain: Secondary | ICD-10-CM

## 2015-03-18 DIAGNOSIS — M79602 Pain in left arm: Secondary | ICD-10-CM

## 2015-03-18 DIAGNOSIS — R9431 Abnormal electrocardiogram [ECG] [EKG]: Secondary | ICD-10-CM

## 2015-03-18 DIAGNOSIS — R002 Palpitations: Secondary | ICD-10-CM

## 2015-03-25 ENCOUNTER — Other Ambulatory Visit: Payer: Self-pay | Admitting: Family Medicine

## 2015-03-25 DIAGNOSIS — R911 Solitary pulmonary nodule: Secondary | ICD-10-CM

## 2015-03-27 ENCOUNTER — Ambulatory Visit
Admission: RE | Admit: 2015-03-27 | Discharge: 2015-03-27 | Disposition: A | Discharge status: Home / Self Care | Payer: 59 | Source: Ambulatory Visit | Attending: Family Medicine | Admitting: Family Medicine

## 2015-03-27 DIAGNOSIS — R911 Solitary pulmonary nodule: Secondary | ICD-10-CM | POA: Diagnosis not present

## 2015-03-27 DIAGNOSIS — R938 Abnormal findings on diagnostic imaging of other specified body structures: Secondary | ICD-10-CM | POA: Diagnosis present

## 2015-03-27 HISTORY — DX: Polyneuropathy, unspecified: G62.9

## 2015-03-27 MED ORDER — IOHEXOL 350 MG/ML SOLN
75.0000 mL | Freq: Once | INTRAVENOUS | Status: AC | PRN
  Administered 2015-03-27: 75 mL via INTRAVENOUS

## 2015-04-01 ENCOUNTER — Other Ambulatory Visit: Payer: Self-pay | Admitting: Family Medicine

## 2015-04-01 DIAGNOSIS — R918 Other nonspecific abnormal finding of lung field: Secondary | ICD-10-CM

## 2015-04-02 ENCOUNTER — Telehealth: Payer: Self-pay | Admitting: Family Medicine

## 2015-04-02 ENCOUNTER — Ambulatory Visit
Admission: RE | Admit: 2015-04-02 | Discharge: 2015-04-02 | Disposition: A | Discharge status: Home / Self Care | Payer: 59 | Source: Ambulatory Visit | Attending: Family Medicine | Admitting: Family Medicine

## 2015-04-02 DIAGNOSIS — R911 Solitary pulmonary nodule: Secondary | ICD-10-CM | POA: Diagnosis not present

## 2015-04-02 DIAGNOSIS — R918 Other nonspecific abnormal finding of lung field: Secondary | ICD-10-CM

## 2015-04-02 DIAGNOSIS — R938 Abnormal findings on diagnostic imaging of other specified body structures: Secondary | ICD-10-CM | POA: Diagnosis present

## 2015-04-02 LAB — GLUCOSE, CAPILLARY: Glucose-Capillary: 126 mg/dL — ABNORMAL HIGH (ref 65–99)

## 2015-04-02 MED ORDER — FLUDEOXYGLUCOSE F - 18 (FDG) INJECTION
13.4100 | Freq: Once | INTRAVENOUS | Status: AC | PRN
  Administered 2015-04-02: 13.41 via INTRAVENOUS

## 2015-04-02 NOTE — Telephone Encounter (Signed)
See note. Informed patient of normal results. Recheck in one year.

## 2015-04-02 NOTE — Telephone Encounter (Signed)
Patient states that she had her PET scan done 04/02/15

## 2015-04-04 ENCOUNTER — Other Ambulatory Visit: Payer: 59

## 2015-04-16 ENCOUNTER — Telehealth: Payer: Self-pay | Admitting: Family Medicine

## 2015-04-16 NOTE — Telephone Encounter (Signed)
Need ov.  Thanks.

## 2015-04-16 NOTE — Telephone Encounter (Signed)
Called pt to advise that OV was needed. LMTCB. Thanks TNP

## 2015-04-16 NOTE — Telephone Encounter (Signed)
Pt is having frequency to void and burning.  Pt states she does have blood in her urine.  Pt is also having problems with her hemorrhoids and may have another appointment today.  Pt is request a Rx to help with the frequency and burning if possible.  [facility].  CB#[phone]/MJ

## 2015-04-16 NOTE — Telephone Encounter (Signed)
Pt states she will call back to schedule an appointment/MJ

## 2015-04-24 ENCOUNTER — Ambulatory Visit: Payer: Self-pay | Admitting: Cardiovascular Disease

## 2015-07-04 ENCOUNTER — Telehealth: Payer: Self-pay | Admitting: Family Medicine

## 2015-07-04 NOTE — Telephone Encounter (Signed)
There is no message for this patient; please advise.

## 2015-07-07 ENCOUNTER — Other Ambulatory Visit: Payer: Self-pay | Admitting: Family Medicine

## 2015-07-07 DIAGNOSIS — E1142 Type 2 diabetes mellitus with diabetic polyneuropathy: Secondary | ICD-10-CM

## 2015-07-08 ENCOUNTER — Other Ambulatory Visit: Payer: Self-pay

## 2015-07-08 DIAGNOSIS — R609 Edema, unspecified: Secondary | ICD-10-CM | POA: Insufficient documentation

## 2015-07-08 DIAGNOSIS — R031 Nonspecific low blood-pressure reading: Secondary | ICD-10-CM | POA: Insufficient documentation

## 2015-07-08 DIAGNOSIS — K219 Gastro-esophageal reflux disease without esophagitis: Secondary | ICD-10-CM | POA: Insufficient documentation

## 2015-07-08 DIAGNOSIS — R002 Palpitations: Secondary | ICD-10-CM | POA: Insufficient documentation

## 2015-07-08 DIAGNOSIS — G4700 Insomnia, unspecified: Secondary | ICD-10-CM

## 2015-07-08 DIAGNOSIS — I773 Arterial fibromuscular dysplasia: Secondary | ICD-10-CM

## 2015-07-08 DIAGNOSIS — E1142 Type 2 diabetes mellitus with diabetic polyneuropathy: Secondary | ICD-10-CM

## 2015-07-08 DIAGNOSIS — G609 Hereditary and idiopathic neuropathy, unspecified: Secondary | ICD-10-CM

## 2015-07-08 HISTORY — DX: Arterial fibromuscular dysplasia: I77.3

## 2015-07-08 MED ORDER — PREGABALIN 100 MG PO CAPS: 100.0000 mg | ORAL_CAPSULE | Freq: Three times a day (TID) | ORAL | Status: DC

## 2015-07-08 MED ORDER — LORAZEPAM 2 MG PO TABS: 2.0000 mg | ORAL_TABLET | Freq: Every evening | ORAL | Status: DC | PRN

## 2015-07-08 NOTE — Telephone Encounter (Signed)
Ok to call in rx.  Thanks.  

## 2015-07-09 NOTE — Telephone Encounter (Signed)
Patient was going to leave a message but instead decided to make an appt.  She will be in on the 9th of Sept.  There was no call back message. Thanks, [employer]

## 2015-07-11 ENCOUNTER — Encounter: Payer: Self-pay | Admitting: Family Medicine

## 2015-07-11 ENCOUNTER — Ambulatory Visit (INDEPENDENT_AMBULATORY_CARE_PROVIDER_SITE_OTHER): Payer: 59 | Admitting: Family Medicine

## 2015-07-11 VITALS — BP 128/80 | HR 72 | Temp 97.8°F | Resp 16 | Ht 67.5 in | Wt 245.0 lb

## 2015-07-11 DIAGNOSIS — E1142 Type 2 diabetes mellitus with diabetic polyneuropathy: Secondary | ICD-10-CM

## 2015-07-11 DIAGNOSIS — E669 Obesity, unspecified: Secondary | ICD-10-CM | POA: Diagnosis not present

## 2015-07-11 NOTE — Progress Notes (Signed)
Subjective:    Patient ID: Melody Brooks, female    DOB: 1956/04/20, 59 y.o.   MRN: [id number]  HPI  Obesity: Patient complains of obesity. Patient cites health as reasons for wanting to lose weight.  Obesity History Weight in late teens: 150 lb. Period of greatest weight gain: 60-70 lb at 59 YO Lowest adult weight: 140 Highest adult weight: 250 Amount of time at present weight: 6 months   History of Weight Loss Efforts Greatest amount of weight lost: 40 lb over 3-4 months Amount of time that loss was maintained: 12 months Circumstances associated with regain of weight: neuropathy Successful weight loss techniques attempted: diet and exercise Unsuccessful weight loss techniques attempted: diet and exercise since getting older  Current Exercise Habits walking daily x 30-40 minutes. Pt exercises on glider 3 times a week. Also walks stairs weekly.  Current Eating Habits Number of regular meals per day: 2 Number of snacking episodes per day: 2 Who shops for food? patient and husband Who prepares food? patient and husband Who eats with patient? patient and husband Binge behavior?: No Purge behavior? no Anorexic behavior? no Eating precipitated by stress? yes - "sometimes it can be." Guilt feelings associated with eating? yes   Other Potential Contributing Factors Use of alcohol: average 0 drinks/week History of past abuse? none Psych History: none Comorbidities: diabetes mellitus, GERD and hypertension  Pt needs [employer] Form filled out today for insurance.  Review of Systems  Constitutional: Negative for fever, chills, diaphoresis, activity change, appetite change, fatigue and unexpected weight change.  Respiratory: Negative for apnea, cough, shortness of breath and wheezing.   Cardiovascular: Negative for chest pain, palpitations and leg swelling.    Patient Active Problem List   Diagnosis Date Noted  . Accumulation of fluid in tissues 07/08/2015  .  Arterial fibromuscular dysplasia 07/08/2015  . Acid reflux 07/08/2015  . Arterial blood pressure decreased 07/08/2015  . Intermittent palpitations 07/08/2015  . Left arm pain 03/12/2015  . Chest pain 03/12/2015  . Obesity 03/12/2015  . Essential hypertension 03/12/2015  . Bursitis, trochanteric 12/12/2014  . Lumbosacral radiculitis 12/11/2014  . Angio-edema 05/24/2013  . Internal hemorrhoids with complication 61/44/3154  . HYPERLIPIDEMIA-MIXED 12/04/2010  . PALPITATIONS 12/04/2010  . DYSPNEA 12/04/2010  . ABNORMAL EKG 12/04/2010  . Inflammation with thrombosis 04/23/2010  . Burning or prickling sensation 04/17/2010  . Anemia, iron deficiency 03/06/2008  . B12 deficiency 03/06/2008  . Avitaminosis D 03/06/2008  . Cannot sleep 03/01/2008  . Idiopathic peripheral neuropathy 12/23/2003  . Diabetes mellitus with polyneuropathy 07/17/2003  . Bursitis 07/30/2002  . Idiopathic scoliosis 07/30/2002   Past Medical History  Diagnosis Date  . Anemia   . Fatigue   . Hiatal hernia   . GERD (gastroesophageal reflux disease)   . Arthritis   . Hypertension   . Constipation   . Palpitations   . Rectal bleeding   . Rectal pain   . Generalized headaches   . Hemorrhoids   . Fibromuscular dysplasia   . Angioedema   . Neuropathy   . Diabetes mellitus     Patient takes Janumet and Actos.   Current Outpatient Prescriptions on File Prior to Visit  Medication Sig  . acetaminophen (TYLENOL) 325 MG tablet Take 1 tablet by mouth as needed.  Marland Kitchen aspirin 81 MG tablet Take 81 mg by mouth daily.  . cholecalciferol (VITAMIN D) 1000 UNITS tablet Take 1,000 Units by mouth daily.    . Cyanocobalamin (VITAMIN B-12 IJ) Inject as  directed every 30 (thirty) days.    Marland Kitchen dexlansoprazole (DEXILANT) 60 MG capsule Take 60 mg by mouth daily.  Marland Kitchen estrogen, conjugated,-medroxyprogesterone (PREMPRO) 0.3-1.5 MG per tablet Take 1 tablet by mouth daily.  . ferrous fumarate (FERROCITE) 325 (106 FE) MG TABS Take 1 tablet  by mouth 2 (two) times daily.    . hydrochlorothiazide (HYDRODIURIL) 12.5 MG tablet Take 12.5 mg by mouth daily.  Marland Kitchen ibuprofen (ADVIL,MOTRIN) 200 MG tablet IBUPROFEN, 200MG  (Oral Tablet)  2 to 4 tabs three times a day as needed for 0 days  Quantity: 60.00;  Refills: 0   Ordered :28-Aug-2010  Margarita Rana MD;  Started 23-April-2010 Active Comments: Medication taken as needed. DX: 451.9  . JANUMET 50-1000 MG per tablet   . lidocaine (XYLOCAINE) 5 % ointment APPLY EXTERNALLY 3 TIMES A DAY AS NEEDED  . LORazepam (ATIVAN) 2 MG tablet Take 1 tablet (2 mg total) by mouth at bedtime as needed.  Marland Kitchen losartan (COZAAR) 50 MG tablet Take 50 mg by mouth daily.  . montelukast (SINGULAIR) 10 MG tablet Take 1 tablet by mouth daily.  . pioglitazone (ACTOS) 45 MG tablet Take 1 tablet by mouth  every night at bedtime  . pregabalin (LYRICA) 100 MG capsule Take 1 capsule (100 mg total) by mouth 3 (three) times daily.  . ranitidine (ZANTAC) 150 MG capsule Take 150 mg by mouth at bedtime.  Marland Kitchen zolpidem (AMBIEN) 5 MG tablet Take 5 mg by mouth at bedtime as needed.    . hydrocortisone (ANUSOL-HC) 25 MG suppository Place 1 suppository (25 mg total) rectally 2 (two) times daily. (Patient not taking: Reported on 07/11/2015)  . Omega-3 Fatty Acids (FISH OIL) 1200 MG CAPS Take by mouth daily.     No current facility-administered medications on file prior to visit.   Allergies  Allergen Reactions  . Lisinopril Swelling and Hives    Angioedema   Past Surgical History  Procedure Laterality Date  . Right and left eye cataract surgery  2006, 2008    right in 2006 and left 2008  . Tubal ligation  1995  . Colonoscopy  2010  . Excisional hemorrhoidectomy  03/2012   Social History   Social History  . Marital Status: Married    Spouse Name: N/A  . Number of Children: N/A  . Years of Education: N/A   Occupational History  . Not on file.   Social History Main Topics  . Smoking status: Never Smoker   . Smokeless  tobacco: Never Used     Comment: tobacco use- no  . Alcohol Use: No  . Drug Use: No  . Sexual Activity: Not on file   Other Topics Concern  . Not on file   Social History Narrative   Full time. Married. Regularly exercises- walks.    Family History  Problem Relation Age of Onset  . Heart failure Mother   . COPD Mother   . Hypertension Mother   . Arthritis Mother   . Cancer Father     prostate  . Cancer Maternal Aunt     colon  . Cancer Paternal Grandmother     stomach  . Diabetes Brother       Objective:   Physical Exam  Constitutional: She is oriented to person, place, and time. She appears well-developed and well-nourished.  Neurological: She is alert and oriented to person, place, and time.  Psychiatric: She has a normal mood and affect. Her behavior is normal. Judgment and thought content normal.  BP 128/80 mmHg  Pulse 72  Temp(Src) 97.8 F (36.6 C) (Oral)  Resp 16  Ht 5' 7.5" (1.715 m)  Wt 245 lb (111.131 kg)  BMI 37.78 kg/m2  LMP 03/26/2012 (Approximate)     Assessment & Plan:  1. Diabetic polyneuropathy associated with type 2 diabetes mellitus Taking medication as directed. Will address at follow up.   2. Obesity Needed exception for biometric screening today for her BMI.  Patient has been trying very hard to loose weight.  She walks six days per week, using an indoor glider threes times per week, stair walking routine seven days each week and does strength exercises. She is an active gardener and hiker. She just obtained a fit bit to monitor daily activities.  She also participates in [facility]'s healthy programs, working with both a Programmer, applications and coach regarding diabetes management, weight loss and nutrition.   Margarita Rana, MD

## 2015-08-08 ENCOUNTER — Encounter: Payer: Self-pay | Admitting: Family Medicine

## 2015-08-08 ENCOUNTER — Ambulatory Visit (INDEPENDENT_AMBULATORY_CARE_PROVIDER_SITE_OTHER): Payer: 59 | Admitting: Family Medicine

## 2015-08-08 VITALS — BP 124/82 | HR 98 | Temp 98.0°F | Resp 16 | Wt 244.0 lb

## 2015-08-08 DIAGNOSIS — E785 Hyperlipidemia, unspecified: Secondary | ICD-10-CM

## 2015-08-08 DIAGNOSIS — E1142 Type 2 diabetes mellitus with diabetic polyneuropathy: Secondary | ICD-10-CM | POA: Diagnosis not present

## 2015-08-08 DIAGNOSIS — I10 Essential (primary) hypertension: Secondary | ICD-10-CM | POA: Diagnosis not present

## 2015-08-08 DIAGNOSIS — E538 Deficiency of other specified B group vitamins: Secondary | ICD-10-CM

## 2015-08-08 MED ORDER — CYANOCOBALAMIN 1000 MCG/ML IJ SOLN: 1000.0000 ug | INTRAMUSCULAR | Status: DC

## 2015-08-08 NOTE — Progress Notes (Signed)
Patient ID: Melody Brooks, female   DOB: [birth year], 59 y.o.   MRN: [id number]         Patient: Melody Brooks Female    DOB: September 12, 1956   59 y.o.   MRN: [id number] Visit Date: 08/08/2015  Today's Provider: Margarita Rana, MD   No chief complaint on file.  Subjective:    Diabetes She presents for her follow-up diabetic visit. She has type 2 diabetes mellitus. Her disease course has been stable. There are no hypoglycemic associated symptoms. Pertinent negatives for hypoglycemia include no dizziness, headaches or sweats. Pertinent negatives for diabetes include no blurred vision, no chest pain, no fatigue, no foot paresthesias, no foot ulcerations, no polydipsia, no polyphagia, no polyuria, no visual change, no weakness and no weight loss. Symptoms are stable. Diabetic complications include autonomic neuropathy. Risk factors for coronary artery disease include dyslipidemia and hypertension. She participates in exercise daily. Her overall blood glucose range is 110-130 mg/dl. She sees a podiatrist.Eye exam is current.  Hypertension This is a chronic problem. The problem is unchanged. The problem is controlled. Pertinent negatives include no blurred vision, chest pain, headaches, malaise/fatigue, neck pain, orthopnea, palpitations, peripheral edema, PND, shortness of breath or sweats. Risk factors for coronary artery disease include diabetes mellitus and dyslipidemia. There are no compliance problems.   Hyperlipidemia This is a chronic problem. The problem is controlled. Pertinent negatives include no chest pain or shortness of breath. There are no compliance problems.  Risk factors for coronary artery disease include diabetes mellitus, dyslipidemia and hypertension.      Allergies  Allergen Reactions  . Lisinopril Swelling and Hives    Angioedema   Previous Medications   ACETAMINOPHEN (TYLENOL) 325 MG TABLET    Take 1 tablet by mouth as needed.   ASPIRIN 81 MG TABLET    Take 81 mg by  mouth daily.   CHOLECALCIFEROL (VITAMIN D) 1000 UNITS TABLET    Take 1,000 Units by mouth daily.     COLCHICINE 0.6 MG TABLET       CYANOCOBALAMIN (VITAMIN B-12 IJ)    Inject as directed every 30 (thirty) days.     DESLORATADINE (CLARINEX) 5 MG TABLET    Take 5 mg by mouth daily.   DEXLANSOPRAZOLE (DEXILANT) 60 MG CAPSULE    Take 60 mg by mouth daily.   ESTROGEN, CONJUGATED,-MEDROXYPROGESTERONE (PREMPRO) 0.3-1.5 MG PER TABLET    Take 1 tablet by mouth daily.   FERROUS FUMARATE (FERROCITE) 325 (106 FE) MG TABS    Take 1 tablet by mouth 2 (two) times daily.     HYDROCHLOROTHIAZIDE (HYDRODIURIL) 12.5 MG TABLET    Take 12.5 mg by mouth daily.   HYDROCORTISONE (ANUSOL-HC) 25 MG SUPPOSITORY    Place 1 suppository (25 mg total) rectally 2 (two) times daily.   IBUPROFEN (ADVIL,MOTRIN) 200 MG TABLET    IBUPROFEN, 200MG  (Oral Tablet)  2 to 4 tabs three times a day as needed for 0 days  Quantity: 60.00;  Refills: 0   Ordered :28-Aug-2010  Margarita Rana MD;  Started 23-April-2010 Active Comments: Medication taken as needed. DX: 451.9   JANUMET 50-1000 MG PER TABLET       LIDOCAINE (XYLOCAINE) 5 % OINTMENT    APPLY EXTERNALLY 3 TIMES A DAY AS NEEDED   LORAZEPAM (ATIVAN) 2 MG TABLET    Take 1 tablet (2 mg total) by mouth at bedtime as needed.   LOSARTAN (COZAAR) 50 MG TABLET    Take 50 mg by mouth daily.  MONTELUKAST (SINGULAIR) 10 MG TABLET    Take 1 tablet by mouth daily.   OMEGA-3 FATTY ACIDS (FISH OIL) 1200 MG CAPS    Take by mouth daily.     PIOGLITAZONE (ACTOS) 45 MG TABLET    Take 1 tablet by mouth  every night at bedtime   PREGABALIN (LYRICA) 100 MG CAPSULE    Take 1 capsule (100 mg total) by mouth 3 (three) times daily.   RANITIDINE (ZANTAC) 150 MG CAPSULE    Take 150 mg by mouth at bedtime.   ZOLPIDEM (AMBIEN) 5 MG TABLET    Take 5 mg by mouth at bedtime as needed.      Review of Systems  Constitutional: Negative for fever, chills, weight loss, malaise/fatigue, diaphoresis, activity  change, appetite change, fatigue and unexpected weight change.  Eyes: Negative for blurred vision.  Respiratory: Negative for apnea, cough, choking, chest tightness, shortness of breath, wheezing and stridor.   Cardiovascular: Positive for leg swelling (Occasionally). Negative for chest pain, palpitations, orthopnea and PND.  Gastrointestinal: Positive for constipation (Chronic issue) and blood in stool (Already has an appointment for a colonosocpy.). Negative for nausea, vomiting, abdominal pain, diarrhea, abdominal distention, anal bleeding and rectal pain.  Endocrine: Negative for cold intolerance, heat intolerance, polydipsia, polyphagia and polyuria.  Musculoskeletal: Negative for neck pain.  Neurological: Negative for dizziness, weakness, light-headedness and headaches.    Social History  Substance Use Topics  . Smoking status: Never Smoker   . Smokeless tobacco: Never Used     Comment: tobacco use- no  . Alcohol Use: No   Objective:   BP 124/82 mmHg  Pulse 98  Temp(Src) 98 F (36.7 C) (Oral)  Resp 16  Wt 244 lb (110.678 kg)  LMP 03/26/2012 (Approximate)  Physical Exam  Constitutional: She is oriented to person, place, and time. She appears well-developed and well-nourished.  Cardiovascular: Normal rate and regular rhythm.   Pulmonary/Chest: Effort normal and breath sounds normal.  Neurological: She is alert and oriented to person, place, and time.  Psychiatric: She has a normal mood and affect. Her behavior is normal. Judgment and thought content normal.      Assessment & Plan:     1. Essential hypertension Stable, continue medical.    2. Diabetic polyneuropathy associated with type 2 diabetes mellitus ([place]) Stable. Check labs.  - Hemoglobin A1c  3. Hyperlipemia Stable.   4. B12 deficiency Stable.   - cyanocobalamin (,VITAMIN B-12,) 1000 MCG/ML injection; Inject 1 mL (1,000 mcg total) into the muscle every 30 (thirty) days. Please send syringes also  Dispense: 3  mL; Refill: 3     Margarita Rana, MD  [place]

## 2015-08-12 ENCOUNTER — Other Ambulatory Visit: Payer: Self-pay

## 2015-08-12 DIAGNOSIS — E538 Deficiency of other specified B group vitamins: Secondary | ICD-10-CM

## 2015-08-12 MED ORDER — CYANOCOBALAMIN 1000 MCG/ML IJ SOLN
1000.0000 ug | INTRAMUSCULAR | Status: DC
Start: 2015-08-12 — End: 2016-12-24

## 2015-08-12 NOTE — Telephone Encounter (Signed)
Mail order pharmacy requesting refill.

## 2015-08-30 ENCOUNTER — Other Ambulatory Visit: Payer: Self-pay | Admitting: Family Medicine

## 2015-08-30 DIAGNOSIS — I10 Essential (primary) hypertension: Secondary | ICD-10-CM

## 2015-10-05 ENCOUNTER — Other Ambulatory Visit: Payer: Self-pay | Admitting: Family Medicine

## 2015-10-05 DIAGNOSIS — I10 Essential (primary) hypertension: Secondary | ICD-10-CM

## 2015-10-05 DIAGNOSIS — E559 Vitamin D deficiency, unspecified: Secondary | ICD-10-CM

## 2015-10-07 ENCOUNTER — Other Ambulatory Visit: Payer: Self-pay

## 2015-10-07 DIAGNOSIS — G4700 Insomnia, unspecified: Secondary | ICD-10-CM

## 2015-10-09 ENCOUNTER — Other Ambulatory Visit: Payer: Self-pay

## 2015-10-09 DIAGNOSIS — G4700 Insomnia, unspecified: Secondary | ICD-10-CM

## 2015-10-09 MED ORDER — ZOLPIDEM TARTRATE 5 MG PO TABS: 5.0000 mg | ORAL_TABLET | Freq: Every evening | ORAL | Status: DC | PRN

## 2015-10-09 MED ORDER — LORAZEPAM 2 MG PO TABS
2.0000 mg | ORAL_TABLET | Freq: Every evening | ORAL | Status: DC | PRN
Start: 2015-10-09 — End: 2016-05-27

## 2015-10-09 NOTE — Telephone Encounter (Signed)
Printed, please fax or call in to pharmacy. Thank you.   

## 2015-12-12 ENCOUNTER — Encounter: Payer: 59 | Admitting: Family Medicine

## 2015-12-26 ENCOUNTER — Encounter: Payer: Self-pay | Admitting: Family Medicine

## 2015-12-26 ENCOUNTER — Ambulatory Visit (INDEPENDENT_AMBULATORY_CARE_PROVIDER_SITE_OTHER): Payer: 59 | Admitting: Family Medicine

## 2015-12-26 VITALS — BP 122/72 | HR 77 | Temp 98.0°F | Resp 16 | Ht 68.0 in | Wt 246.0 lb

## 2015-12-26 DIAGNOSIS — D509 Iron deficiency anemia, unspecified: Secondary | ICD-10-CM

## 2015-12-26 DIAGNOSIS — I10 Essential (primary) hypertension: Secondary | ICD-10-CM | POA: Diagnosis not present

## 2015-12-26 DIAGNOSIS — R918 Other nonspecific abnormal finding of lung field: Secondary | ICD-10-CM

## 2015-12-26 DIAGNOSIS — Z0000 Encounter for general adult medical examination without abnormal findings: Secondary | ICD-10-CM

## 2015-12-26 DIAGNOSIS — E1142 Type 2 diabetes mellitus with diabetic polyneuropathy: Secondary | ICD-10-CM | POA: Diagnosis not present

## 2015-12-26 DIAGNOSIS — E785 Hyperlipidemia, unspecified: Secondary | ICD-10-CM

## 2015-12-26 LAB — POCT URINALYSIS DIPSTICK
Bilirubin, UA: NEGATIVE
GLUCOSE UA: NEGATIVE
KETONES UA: NEGATIVE
Leukocytes, UA: NEGATIVE
Nitrite, UA: NEGATIVE
Protein, UA: NEGATIVE
RBC UA: NEGATIVE
Spec Grav, UA: 1.01
UROBILINOGEN UA: 0.2
pH, UA: 7

## 2015-12-26 LAB — POCT UA - MICROALBUMIN: Microalbumin Ur, POC: NEGATIVE mg/L

## 2015-12-26 MED ORDER — GLIPIZIDE ER 2.5 MG PO TB24: 2.5000 mg | ORAL_TABLET | Freq: Every day | ORAL | Status: DC

## 2015-12-26 NOTE — Progress Notes (Signed)
Patient ID: Melody Brooks, female   DOB: 06/17/1956, 60 y.o.   MRN: [id number]       Patient: Melody Brooks, Female    DOB: November 08, 1955, 60 y.o.   MRN: [id number] Visit Date: 12/26/2015  Today's Provider: Margarita Rana, MD   Chief Complaint  Patient presents with  . Annual Exam  . Diabetes   Subjective:    Annual physical exam Melody Brooks is a 60 y.o. female who presents today for health maintenance and complete physical. She feels fairly well. She reports exercising daily walking 25-40 minutes. She reports she is sleeping fairly well.  12/20/14 CPE 05/2015    Pap-neg, [place] 05/2015    Mammo-normal 09/26/12 Colonoscopy-int hemorrhoids, medium sized lipoma in the cecum  Needs repeat CT for lung nodule scheduled.  -----------------------------------------------------------------   Diabetes Mellitus Type II, Follow-up:   Last seen for diabetes 5 months ago.  Management since then includes no changes. She reports excellent compliance with treatment. She is not having side effects.  Current symptoms include paresthesia of the feet and have been worsening. Home blood sugar records: fasting range: 130's  Episodes of hypoglycemia? no   Current Insulin Regimen: none Most Recent Eye Exam: 10/2015 [facility] Weight trend: stable Prior visit with dietician: yes Current diet: in general, a "healthy" diet   Current exercise: walking  Pertinent Labs:    Component Value Date/Time   CREATININE 1.0 12/20/2014    Wt Readings from Last 3 Encounters:  12/26/15 246 lb (111.585 kg)  08/08/15 244 lb (110.678 kg)  07/11/15 245 lb (111.131 kg)    ------------------------------------------------------------------------     Review of Systems  Constitutional: Positive for fatigue.  HENT: Positive for congestion, rhinorrhea, tinnitus and voice change.   Eyes: Negative.   Respiratory: Positive for shortness of breath.   Cardiovascular: Positive for  palpitations and leg swelling.  Gastrointestinal: Positive for constipation and abdominal distention.  Endocrine: Negative.   Genitourinary: Negative.   Musculoskeletal: Positive for back pain, arthralgias, gait problem and neck pain.  Skin: Positive for rash.  Allergic/Immunologic: Negative.   Neurological: Positive for dizziness, numbness and headaches.  Hematological: Negative.   Psychiatric/Behavioral: Positive for sleep disturbance.    Social History      She  reports that she has never smoked. She has never used smokeless tobacco. She reports that she does not drink alcohol or use illicit drugs.       Social History   Social History  . Marital Status: Married    Spouse Name: N/A  . Number of Children: N/A  . Years of Education: N/A   Social History Main Topics  . Smoking status: Never Smoker   . Smokeless tobacco: Never Used     Comment: tobacco use- no  . Alcohol Use: No  . Drug Use: No  . Sexual Activity: Not Asked   Other Topics Concern  . None   Social History Narrative   Full time. Married. Regularly exercises- walks.     Past Medical History  Diagnosis Date  . Anemia   . Fatigue   . Hiatal hernia   . GERD (gastroesophageal reflux disease)   . Arthritis   . Hypertension   . Constipation   . Palpitations   . Rectal bleeding   . Rectal pain   . Generalized headaches   . Hemorrhoids   . Fibromuscular dysplasia ([place])   . Angioedema   . Neuropathy ([place])   . Diabetes mellitus  Patient takes Janumet and Actos.     Patient Active Problem List   Diagnosis Date Noted  . Accumulation of fluid in tissues 07/08/2015  . Arterial fibromuscular dysplasia ([place]) 07/08/2015  . Acid reflux 07/08/2015  . Left arm pain 03/12/2015  . Obesity 03/12/2015  . Essential hypertension 03/12/2015  . Bursitis, trochanteric 12/12/2014  . Lumbosacral radiculitis 12/11/2014  . Angio-edema 05/24/2013  . Internal hemorrhoids with complication [id number]  .  Hyperlipemia 12/04/2010  . PALPITATIONS 12/04/2010  . DYSPNEA 12/04/2010  . ABNORMAL EKG 12/04/2010  . Inflammation with thrombosis 04/23/2010  . Anemia, iron deficiency 03/06/2008  . B12 deficiency 03/06/2008  . Avitaminosis D 03/06/2008  . Insomnia 03/01/2008  . Idiopathic peripheral neuropathy ([place]) 12/23/2003  . Diabetes mellitus with polyneuropathy ([place]) 07/17/2003  . Idiopathic scoliosis 07/30/2002    Past Surgical History  Procedure Laterality Date  . Right and left eye cataract surgery  2006, 2008    right in 2006 and left 2008  . Tubal ligation  1995  . Colonoscopy  2010  . Excisional hemorrhoidectomy  03/2012    Family History        Family Status  Relation Status Death Age  . Mother Deceased 47    chf  . Father Alive     82; carotid and leg blockages.   . Maternal Aunt Alive   . Paternal Grandmother Deceased   . Brother Alive   . Sister Alive   . Sister Alive   . Brother Alive   . Brother Alive   . Brother Alive   . Brother Alive         Her family history includes Arthritis in her mother; COPD in her mother; Cancer in her father, maternal aunt, and paternal grandmother; Diabetes in her brother and brother; Heart failure in her mother; Hypertension in her mother; Parkinson's disease in her father.    Allergies  Allergen Reactions  . Lisinopril Swelling and Hives    Angioedema    Previous Medications   ACETAMINOPHEN (TYLENOL) 325 MG TABLET    Take 1 tablet by mouth as needed.   ASPIRIN 81 MG TABLET    Take 81 mg by mouth daily.   CHOLECALCIFEROL (VITAMIN D) 1000 UNITS TABLET    Take 1 tablet by mouth  daily   COLCHICINE 0.6 MG TABLET    Take 0.6 mg by mouth daily.    CYANOCOBALAMIN (,VITAMIN B-12,) 1000 MCG/ML INJECTION    Inject 1 mL (1,000 mcg total) into the muscle every 30 (thirty) days. Please send syringes also   DEXLANSOPRAZOLE (DEXILANT) 60 MG CAPSULE    Take 60 mg by mouth daily.   EPIPEN 2-PAK 0.3 MG/0.3ML SOAJ INJECTION    as needed.    ESTROGEN, CONJUGATED,-MEDROXYPROGESTERONE (PREMPRO) 0.3-1.5 MG PER TABLET    Take 1 tablet by mouth daily.   FERROUS FUMARATE (FERROCITE) 325 (106 FE) MG TABS    Take 1 tablet by mouth daily.    HYDROCHLOROTHIAZIDE (MICROZIDE) 12.5 MG CAPSULE    Take 1 capsule by mouth  daily   HYDROCORTISONE (ANUSOL-HC) 25 MG SUPPOSITORY    Place 1 suppository (25 mg total) rectally 2 (two) times daily.   IBUPROFEN (ADVIL,MOTRIN) 200 MG TABLET    IBUPROFEN, 200MG  (Oral Tablet)  2 to 4 tabs three times a day as needed for 0 days  Quantity: 60.00;  Refills: 0   Ordered :28-Aug-2010  Margarita Rana MD;  Started 23-April-2010 Active Comments: Medication taken as needed. DX: 451.9   JANUMET  50-1000 MG PER TABLET    Take 1 tablet by mouth 2 (two) times daily with a meal.    LEVOCETIRIZINE (XYZAL) 5 MG TABLET    Take 1 tablet by mouth daily.   LIDOCAINE (XYLOCAINE) 5 % OINTMENT    APPLY EXTERNALLY 3 TIMES A DAY AS NEEDED   LORAZEPAM (ATIVAN) 2 MG TABLET    Take 1 tablet (2 mg total) by mouth at bedtime as needed.   LOSARTAN (COZAAR) 50 MG TABLET    Take 1 tablet by mouth  daily   MONTELUKAST (SINGULAIR) 10 MG TABLET    Take 1 tablet by mouth daily.   OMEGA-3 FATTY ACIDS (FISH OIL) 1200 MG CAPS    Take by mouth daily. Reported on 12/26/2015   PIOGLITAZONE (ACTOS) 45 MG TABLET    Take 1 tablet by mouth  every night at bedtime   PREDNISONE (DELTASONE) 10 MG TABLET    Take by mouth. 3 tablet BID not to exceed 5 days as needed for flare up   PREGABALIN (LYRICA) 100 MG CAPSULE    Take 1 capsule (100 mg total) by mouth 3 (three) times daily.   RANITIDINE (ZANTAC) 150 MG CAPSULE    Take 150 mg by mouth at bedtime.   ZOLPIDEM (AMBIEN) 5 MG TABLET    Take 1 tablet (5 mg total) by mouth at bedtime as needed.    Patient Care Team: Margarita Rana, MD as PCP - General (Family Medicine)     Objective:   Vitals: BP 122/72 mmHg  Pulse 77  Temp(Src) 98 F (36.7 C) (Oral)  Resp 16  Ht 5\' 8"  (1.727 m)  Wt 246 lb (111.585 kg)   BMI 37.41 kg/m2  SpO2 97%  LMP 03/26/2012 (Approximate)   Physical Exam  Constitutional: She is oriented to person, place, and time. She appears well-developed and well-nourished.  HENT:  Head: Normocephalic and atraumatic.  Right Ear: Tympanic membrane, external ear and ear canal normal.  Left Ear: Tympanic membrane, external ear and ear canal normal.  Nose: Nose normal.  Mouth/Throat: Uvula is midline, oropharynx is clear and moist and mucous membranes are normal.  Eyes: Conjunctivae, EOM and lids are normal. Pupils are equal, round, and reactive to light.  Neck: Trachea normal and normal range of motion. Neck supple. Carotid bruit is not present. No thyroid mass and no thyromegaly present.  Cardiovascular: Normal rate, regular rhythm and normal heart sounds.   Pulmonary/Chest: Effort normal and breath sounds normal.  Abdominal: Soft. Normal appearance and bowel sounds are normal. There is no hepatosplenomegaly. There is no tenderness.  Musculoskeletal: Normal range of motion.  Lymphadenopathy:    She has no cervical adenopathy.    She has no axillary adenopathy.  Neurological: She is alert and oriented to person, place, and time. She has normal strength. No cranial nerve deficit.  Skin: Skin is warm, dry and intact.  Psychiatric: She has a normal mood and affect. Her speech is normal and behavior is normal. Judgment and thought content normal. Cognition and memory are normal.   Diabetic Foot Exam - Simple   Simple Foot Form  Diabetic Foot exam was performed with the following findings:  Yes 12/26/2015 10:55 AM  Visual Inspection  No deformities, no ulcerations, no other skin breakdown bilaterally:  Yes  Sensation Testing  See comments:  Yes  Pulse Check  Posterior Tibialis and Dorsalis pulse intact bilaterally:  Yes  Comments  Decreased sensation        Depression Screen PHQ 2/9  Scores 12/26/2015  PHQ - 2 Score 0  PHQ- 9 Score 5      Assessment & Plan:     Routine  Health Maintenance and Physical Exam  Exercise Activities and Dietary recommendations Goals    None      Immunization History  Administered Date(s) Administered  . Pneumococcal Polysaccharide-23 09/01/2011  . Tdap 08/28/2010        1. Annual physical exam Stable. Patient advised to continue eating healthy and exercise daily. - POCT urinalysis dipstick  2. Diabetic polyneuropathy associated with type 2 diabetes mellitus ([place]) Not to goal. Patient started on glipizide 2.5 mg as below. Patient advised to continue monitoring blood sugars. - POCT UA - Microalbumin - Hemoglobin A1c - glipiZIDE (GLUCOTROL XL) 2.5 MG 24 hr tablet; Take 1 tablet (2.5 mg total) by mouth daily with breakfast.  Dispense: 30 tablet; Refill: 5  3. Essential hypertension - Comprehensive metabolic panel - TSH  4. Anemia, iron deficiency - CBC with Differential/Platelet  5. Hyperlipemia - Lipid Panel With LDL/HDL Ratio  6. Abnormal CT scan of lung - CT Chest W Contrast; Future     Patient seen and examined by Dr. Jerrell Belfast, and note scribed by Philbert Riser. Dim[place], CMA.  I have reviewed the document for accuracy and completeness and I agree with above. Jerrell Belfast, MD   Margarita Rana, MD    --------------------------------------------------------------------

## 2015-12-27 LAB — COMPREHENSIVE METABOLIC PANEL
ALBUMIN: 4.3 g/dL (ref 3.5–5.5)
ALK PHOS: 85 IU/L (ref 39–117)
ALT: 43 IU/L — ABNORMAL HIGH (ref 0–32)
AST: 41 IU/L — ABNORMAL HIGH (ref 0–40)
Albumin/Globulin Ratio: 2 (ref 1.1–2.5)
BUN / CREAT RATIO: 18 (ref 9–23)
BUN: 15 mg/dL (ref 6–24)
Bilirubin Total: 0.5 mg/dL (ref 0.0–1.2)
CO2: 23 mmol/L (ref 18–29)
CREATININE: 0.85 mg/dL (ref 0.57–1.00)
Calcium: 9.8 mg/dL (ref 8.7–10.2)
Chloride: 102 mmol/L (ref 96–106)
GFR calc non Af Amer: 75 mL/min/{1.73_m2} (ref >59)
GFR, EST AFRICAN AMERICAN: 87 mL/min/{1.73_m2} (ref >59)
GLOBULIN, TOTAL: 2.2 g/dL (ref 1.5–4.5)
Glucose: 125 mg/dL — ABNORMAL HIGH (ref 65–99)
Potassium: 4.2 mmol/L (ref 3.5–5.2)
SODIUM: 143 mmol/L (ref 134–144)
TOTAL PROTEIN: 6.5 g/dL (ref 6.0–8.5)

## 2015-12-27 LAB — TSH: TSH: 2.07 u[IU]/mL (ref 0.450–4.500)

## 2015-12-27 LAB — CBC WITH DIFFERENTIAL/PLATELET
Basophils Absolute: 0 10*3/uL (ref 0.0–0.2)
Basos: 1 %
EOS (ABSOLUTE): 0.1 10*3/uL (ref 0.0–0.4)
EOS: 3 %
HEMATOCRIT: 37.4 % (ref 34.0–46.6)
HEMOGLOBIN: 12.7 g/dL (ref 11.1–15.9)
IMMATURE GRANS (ABS): 0 10*3/uL (ref 0.0–0.1)
Immature Granulocytes: 0 %
LYMPHS: 37 %
Lymphocytes Absolute: 1.6 10*3/uL (ref 0.7–3.1)
MCH: 29.7 pg (ref 26.6–33.0)
MCHC: 34 g/dL (ref 31.5–35.7)
MCV: 88 fL (ref 79–97)
MONOCYTES: 10 %
Monocytes Absolute: 0.4 10*3/uL (ref 0.1–0.9)
NEUTROS ABS: 2.1 10*3/uL (ref 1.4–7.0)
Neutrophils: 49 %
Platelets: 229 10*3/uL (ref 150–379)
RBC: 4.27 x10E6/uL (ref 3.77–5.28)
RDW: 14.4 % (ref 12.3–15.4)
WBC: 4.3 10*3/uL (ref 3.4–10.8)

## 2015-12-27 LAB — LIPID PANEL WITH LDL/HDL RATIO
Cholesterol, Total: 159 mg/dL (ref 100–199)
HDL: 50 mg/dL (ref >39)
LDL CALC: 81 mg/dL (ref 0–99)
LDl/HDL Ratio: 1.6 ratio units (ref 0.0–3.2)
TRIGLYCERIDES: 141 mg/dL (ref 0–149)
VLDL Cholesterol Cal: 28 mg/dL (ref 5–40)

## 2015-12-27 LAB — HEMOGLOBIN A1C
Est. average glucose Bld gHb Est-mCnc: 143 mg/dL
HEMOGLOBIN A1C: 6.6 % — AB (ref 4.8–5.6)

## 2015-12-29 ENCOUNTER — Telehealth: Payer: Self-pay

## 2015-12-29 NOTE — Telephone Encounter (Signed)
Advised pt of lab results. Pt verbally acknowledges understanding. Emily Drozdowski, CMA   

## 2015-12-29 NOTE — Telephone Encounter (Signed)
-----   Message from Margarita Rana, MD sent at 12/27/2015  8:16 AM EST ----- Labs stable except liver enzymes mildly elevated. Suspect related to fatty liver. Recommend recheck in 6 weeks for stability, often returns to normal on it's owe. Please see if this has been a problem in the past. Thanks.

## 2015-12-30 ENCOUNTER — Other Ambulatory Visit: Payer: Self-pay | Admitting: Family Medicine

## 2015-12-30 DIAGNOSIS — E1142 Type 2 diabetes mellitus with diabetic polyneuropathy: Secondary | ICD-10-CM

## 2015-12-30 MED ORDER — GLIPIZIDE ER 2.5 MG PO TB24: 2.5000 mg | ORAL_TABLET | Freq: Every day | ORAL | Status: DC

## 2015-12-30 NOTE — Telephone Encounter (Signed)
Pt is requesting that her prescription for glipiZIDE (GLUCOTROL XL) 2.5 MG 24 hr tablet. Be sent to [facility]

## 2016-01-01 ENCOUNTER — Other Ambulatory Visit: Payer: Self-pay | Admitting: Family Medicine

## 2016-01-01 DIAGNOSIS — E1142 Type 2 diabetes mellitus with diabetic polyneuropathy: Secondary | ICD-10-CM

## 2016-01-01 NOTE — Telephone Encounter (Signed)
Need to know how often she checks her blood sugar a day and also verify what lancets she uses.  LMTCB 01/01/2016  Thanks,   -Mickel Baas

## 2016-01-01 NOTE — Telephone Encounter (Signed)
Pt contacted office for refill request on the following medications: [employer] testing supplies lancets & strips to [facility]. Thanks TNP

## 2016-01-01 NOTE — Telephone Encounter (Signed)
Please advise test strips and lancets? Not in pt's med list in epic or allscripts.

## 2016-01-01 NOTE — Telephone Encounter (Signed)
Ok to send in rx. Thanks.

## 2016-01-01 NOTE — Telephone Encounter (Signed)
Pt returned Laura's call. Pt stated she uses One Touch Ultra Soft Lancets and checks her sugar 3-4 times a day. Pt also would like an RX for alcohol pads sent with the supply RX to [facility]. Thanks TNP

## 2016-01-02 MED ORDER — ONETOUCH ULTRASOFT LANCETS MISC: Status: DC

## 2016-01-02 MED ORDER — GLUCOSE BLOOD VI STRP: ORAL_STRIP | Status: DC

## 2016-01-02 MED ORDER — ALCOHOL SWABS PADS: MEDICATED_PAD | Status: DC

## 2016-01-05 ENCOUNTER — Ambulatory Visit
Admission: RE | Admit: 2016-01-05 | Discharge: 2016-01-05 | Disposition: A | Discharge status: Home / Self Care | Payer: 59 | Source: Ambulatory Visit | Attending: Family Medicine | Admitting: Family Medicine

## 2016-01-05 ENCOUNTER — Telehealth: Payer: Self-pay

## 2016-01-05 DIAGNOSIS — Q859 Phakomatosis, unspecified: Secondary | ICD-10-CM | POA: Diagnosis not present

## 2016-01-05 DIAGNOSIS — K760 Fatty (change of) liver, not elsewhere classified: Secondary | ICD-10-CM | POA: Insufficient documentation

## 2016-01-05 DIAGNOSIS — R918 Other nonspecific abnormal finding of lung field: Secondary | ICD-10-CM | POA: Diagnosis present

## 2016-01-05 MED ORDER — IOHEXOL 350 MG/ML SOLN
75.0000 mL | Freq: Once | INTRAVENOUS | Status: AC | PRN
  Administered 2016-01-05: 75 mL via INTRAVENOUS

## 2016-01-05 NOTE — Telephone Encounter (Signed)
Informed pt as below. Emily Drozdowski, CMA  

## 2016-01-05 NOTE — Telephone Encounter (Signed)
-----   Message from Margarita Rana, MD sent at 01/05/2016  4:13 PM EST ----- Stable nodule and fatty liver. Maybe repeat in year or two. So unchanged, they do not even recommend recheck.   Thanks.

## 2016-01-05 NOTE — Telephone Encounter (Signed)
LMTCB Emily Drozdowski, CMA  

## 2016-01-09 ENCOUNTER — Other Ambulatory Visit: Payer: Self-pay | Admitting: Family Medicine

## 2016-01-09 DIAGNOSIS — I10 Essential (primary) hypertension: Secondary | ICD-10-CM

## 2016-01-12 ENCOUNTER — Other Ambulatory Visit: Payer: Self-pay

## 2016-01-12 DIAGNOSIS — G4700 Insomnia, unspecified: Secondary | ICD-10-CM

## 2016-01-12 MED ORDER — ZOLPIDEM TARTRATE 5 MG PO TABS: 5.0000 mg | ORAL_TABLET | Freq: Every evening | ORAL | Status: DC | PRN

## 2016-01-12 NOTE — Telephone Encounter (Signed)
Printed, please fax or call in to pharmacy. Thank you.   

## 2016-01-14 ENCOUNTER — Other Ambulatory Visit: Payer: Self-pay

## 2016-01-14 DIAGNOSIS — G4700 Insomnia, unspecified: Secondary | ICD-10-CM

## 2016-01-14 MED ORDER — ZOLPIDEM TARTRATE 5 MG PO TABS: 5.0000 mg | ORAL_TABLET | Freq: Every evening | ORAL | Status: DC | PRN

## 2016-01-14 NOTE — Telephone Encounter (Signed)
Printed, please fax or call in to pharmacy. Thank you.   

## 2016-02-26 ENCOUNTER — Telehealth: Payer: Self-pay | Admitting: Family Medicine

## 2016-02-26 DIAGNOSIS — R748 Abnormal levels of other serum enzymes: Secondary | ICD-10-CM

## 2016-02-26 NOTE — Telephone Encounter (Signed)
Printed off lab slip and informed pt. Emily Drozdowski, CMA  

## 2016-02-26 NOTE — Telephone Encounter (Signed)
Pt is requesting a lab slip to recheck liver enzymes.  CB#[phone]/MW

## 2016-02-26 NOTE — Telephone Encounter (Signed)
Ok to order liver enzymes. Thanks.

## 2016-03-03 ENCOUNTER — Other Ambulatory Visit: Payer: Self-pay | Admitting: Family Medicine

## 2016-03-03 DIAGNOSIS — E1142 Type 2 diabetes mellitus with diabetic polyneuropathy: Secondary | ICD-10-CM

## 2016-03-10 LAB — COMPREHENSIVE METABOLIC PANEL
A/G RATIO: 2.2 (ref 1.2–2.2)
ALT: 52 IU/L — ABNORMAL HIGH (ref 0–32)
AST: 62 IU/L — ABNORMAL HIGH (ref 0–40)
Albumin: 4.1 g/dL (ref 3.6–4.8)
Alkaline Phosphatase: 77 IU/L (ref 39–117)
BUN / CREAT RATIO: 12 (ref 12–28)
BUN: 11 mg/dL (ref 8–27)
Bilirubin Total: 0.4 mg/dL (ref 0.0–1.2)
CALCIUM: 9.7 mg/dL (ref 8.7–10.3)
CO2: 24 mmol/L (ref 18–29)
Chloride: 102 mmol/L (ref 96–106)
Creatinine, Ser: 0.91 mg/dL (ref 0.57–1.00)
GFR, EST AFRICAN AMERICAN: 79 mL/min/{1.73_m2} (ref >59)
GFR, EST NON AFRICAN AMERICAN: 69 mL/min/{1.73_m2} (ref >59)
GLOBULIN, TOTAL: 1.9 g/dL (ref 1.5–4.5)
Glucose: 137 mg/dL — ABNORMAL HIGH (ref 65–99)
POTASSIUM: 4.3 mmol/L (ref 3.5–5.2)
SODIUM: 142 mmol/L (ref 134–144)
TOTAL PROTEIN: 6 g/dL (ref 6.0–8.5)

## 2016-03-12 ENCOUNTER — Telehealth: Payer: Self-pay

## 2016-03-12 DIAGNOSIS — R748 Abnormal levels of other serum enzymes: Secondary | ICD-10-CM

## 2016-03-12 NOTE — Telephone Encounter (Signed)
Pt advised of lab results, she will call back with the name of GI doctor she wants to see in [place], she has not seen them for liver enzyme issue before-aa

## 2016-03-16 ENCOUNTER — Other Ambulatory Visit: Payer: Self-pay | Admitting: Family Medicine

## 2016-03-16 ENCOUNTER — Other Ambulatory Visit: Payer: Self-pay

## 2016-03-16 DIAGNOSIS — G609 Hereditary and idiopathic neuropathy, unspecified: Secondary | ICD-10-CM

## 2016-03-16 MED ORDER — PREGABALIN 100 MG PO CAPS: 100.0000 mg | ORAL_CAPSULE | Freq: Three times a day (TID) | ORAL | Status: DC

## 2016-03-16 NOTE — Telephone Encounter (Signed)
Pt needs refill 30 day pregabalin (LYRICA) 100 MG capsule Taking 07/08/15 -- Margarita Rana, MD Take 1 capsule (100 mg total) by mouth 3 (three) times daily  [facility] [place]  Pt call back [phone]  Thanks, Con Memos

## 2016-04-15 ENCOUNTER — Other Ambulatory Visit: Payer: Self-pay | Admitting: Family Medicine

## 2016-04-15 DIAGNOSIS — E1142 Type 2 diabetes mellitus with diabetic polyneuropathy: Secondary | ICD-10-CM

## 2016-04-30 ENCOUNTER — Encounter: Payer: Self-pay | Admitting: Family Medicine

## 2016-04-30 ENCOUNTER — Ambulatory Visit (INDEPENDENT_AMBULATORY_CARE_PROVIDER_SITE_OTHER): Payer: 59 | Admitting: Family Medicine

## 2016-04-30 VITALS — BP 124/78 | HR 80 | Temp 98.4°F | Resp 16 | Wt 255.0 lb

## 2016-04-30 DIAGNOSIS — E1142 Type 2 diabetes mellitus with diabetic polyneuropathy: Secondary | ICD-10-CM

## 2016-04-30 DIAGNOSIS — E785 Hyperlipidemia, unspecified: Secondary | ICD-10-CM

## 2016-04-30 DIAGNOSIS — I10 Essential (primary) hypertension: Secondary | ICD-10-CM

## 2016-04-30 NOTE — Progress Notes (Signed)
Patient: Melody Brooks Female    DOB: August 20, 1956   60 y.o.   MRN: [id number] Visit Date: 04/30/2016  Today's Provider: Vernie Murders, PA   No chief complaint on file.  Subjective:    HPI  Diabetes Mellitus Type II, Follow-up:   Lab Results  Component Value Date   HGBA1C 6.6* 12/26/2015    Last seen for diabetes 6 months ago.  Management since then includes no changes. She reports good compliance with treatment. She is not having side effects.  Current symptoms include none and have been stable. Home blood sugar records: She checks blood sugar occasionally.   Episodes of hypoglycemia? no   Current Insulin Regimen: None. Oral medications only (Actos, Janumet and Glipizide) Most Recent Eye Exam: last year at [facility].  Weight trend: stable Prior visit with dietician: no Current diet: well balanced Current exercise: walking  Pertinent Labs:    Component Value Date/Time   CHOL 159 12/26/2015 1156   TRIG 141 12/26/2015 1156   HDL 50 12/26/2015 1156   LDLCALC 81 12/26/2015 1156   CREATININE 0.91 03/09/2016 0820   CREATININE 1.0 12/20/2014    Wt Readings from Last 3 Encounters:  12/26/15 246 lb (111.585 kg)  08/08/15 244 lb (110.678 kg)  07/11/15 245 lb (111.131 kg)    ------------------------------------------------------------------------  Past Medical History  Diagnosis Date  . Anemia   . Fatigue   . Hiatal hernia   . GERD (gastroesophageal reflux disease)   . Arthritis   . Hypertension   . Constipation   . Palpitations   . Rectal bleeding   . Rectal pain   . Generalized headaches   . Hemorrhoids   . Fibromuscular dysplasia ([place])   . Angioedema   . Neuropathy ([place])   . Diabetes mellitus     Patient takes Janumet and Actos.   Past Surgical History  Procedure Laterality Date  . Right and left eye cataract surgery  2006, 2008    right in 2006 and left 2008  . Tubal ligation  1995  . Colonoscopy  2010  . Excisional  hemorrhoidectomy  03/2012   Family History  Problem Relation Age of Onset  . Heart failure Mother   . COPD Mother   . Hypertension Mother   . Arthritis Mother   . Cancer Father     prostate  . Parkinson's disease Father   . Cancer Maternal Aunt     colon  . Cancer Paternal Grandmother     stomach  . Diabetes Brother   . Diabetes Brother    Allergies  Allergen Reactions  . Lisinopril Swelling and Hives    Angioedema   Current Meds  Medication Sig  . acetaminophen (TYLENOL) 325 MG tablet Take 1 tablet by mouth as needed.  . Alcohol Swabs PADS Used to check blood sugars 3-4 times a day. Dx E11.9.  Marland Kitchen aspirin 81 MG tablet Take 81 mg by mouth daily.  . cholecalciferol (VITAMIN D) 1000 UNITS tablet Take 1 tablet by mouth  daily  . colchicine 0.6 MG tablet Take 0.6 mg by mouth daily.   . cyanocobalamin (,VITAMIN B-12,) 1000 MCG/ML injection Inject 1 mL (1,000 mcg total) into the muscle every 30 (thirty) days. Please send syringes also  . dexlansoprazole (DEXILANT) 60 MG capsule Take 60 mg by mouth daily.  Marland Kitchen EPIPEN 2-PAK 0.3 MG/0.3ML SOAJ injection as needed.  Marland Kitchen estrogen, conjugated,-medroxyprogesterone (PREMPRO) 0.3-1.5 MG per tablet Take 1 tablet by mouth daily.  Marland Kitchen  ferrous fumarate (FERROCITE) 325 (106 FE) MG TABS Take 1 tablet by mouth daily.   Marland Kitchen GLIPIZIDE XL 2.5 MG 24 hr tablet Take 1 tablet by mouth  daily with breakfast  . glucose blood (ONE TOUCH ULTRA TEST) test strip To check blood sugars 3-4 times a day. Dx: E11.9.  Marland Kitchen hydrochlorothiazide (MICROZIDE) 12.5 MG capsule Take 1 capsule by mouth  daily  . ibuprofen (ADVIL,MOTRIN) 200 MG tablet IBUPROFEN, 200MG  (Oral Tablet)  2 to 4 tabs three times a day as needed for 0 days  Quantity: 60.00;  Refills: 0   Ordered :28-Aug-2010  Margarita Rana MD;  Started 23-April-2010 Active Comments: Medication taken as needed. DX: 451.9  . JANUMET 50-1000 MG tablet Take 1 tablet by mouth two  times daily  . Lancets (ONETOUCH ULTRASOFT)  lancets To check blood sugar 3-4 times a day  DX: E11.9  . levocetirizine (XYZAL) 5 MG tablet Take 1 tablet by mouth daily.  Marland Kitchen lidocaine (XYLOCAINE) 5 % ointment APPLY EXTERNALLY 3 TIMES A DAY AS NEEDED  . LORazepam (ATIVAN) 2 MG tablet Take 1 tablet (2 mg total) by mouth at bedtime as needed.  Marland Kitchen losartan (COZAAR) 50 MG tablet Take 1 tablet by mouth  daily  . Omega-3 Fatty Acids (FISH OIL) 1200 MG CAPS Take by mouth daily. Reported on 12/26/2015  . pioglitazone (ACTOS) 45 MG tablet Take 1 tablet by mouth  every night at bedtime  . predniSONE (DELTASONE) 10 MG tablet Take by mouth. 3 tablet BID not to exceed 5 days as needed for flare up  . pregabalin (LYRICA) 100 MG capsule Take 1 capsule (100 mg total) by mouth 3 (three) times daily.  . ranitidine (ZANTAC) 150 MG capsule Take 150 mg by mouth at bedtime.  Marland Kitchen zolpidem (AMBIEN) 5 MG tablet Take 1 tablet (5 mg total) by mouth at bedtime as needed.    Review of Systems  Constitutional: Negative.   Respiratory: Negative.   Cardiovascular: Negative.   Endocrine: Negative.   Neurological: Negative.     Social History  Substance Use Topics  . Smoking status: Never Smoker   . Smokeless tobacco: Never Used     Comment: tobacco use- no  . Alcohol Use: No   Objective:   BP 124/78 mmHg  Pulse 80  Temp(Src) 98.4 F (36.9 C)  Resp 16  Wt 255 lb (115.667 kg)  LMP 03/26/2012 (Approximate) Body mass index is 38.78 kg/(m^2).   Physical Exam  Constitutional: She is oriented to person, place, and time. She appears well-developed and well-nourished. No distress.  HENT:  Head: Normocephalic and atraumatic.  Right Ear: Hearing normal.  Left Ear: Hearing normal.  Nose: Nose normal.  Eyes: Conjunctivae and lids are normal. Right eye exhibits no discharge. Left eye exhibits no discharge. No scleral icterus.  Neck: Neck supple.  Cardiovascular: Normal rate and regular rhythm.   Pulmonary/Chest: Effort normal and breath sounds normal. No respiratory  distress.  Abdominal: Soft. Bowel sounds are normal.  Musculoskeletal: Normal range of motion.  Neurological: She is alert and oriented to person, place, and time.  Numbness plantar surface of both feet with pins and needles sensation occasionally up to mid-calf area.  Skin: Skin is intact. No lesion and no rash noted.  Psychiatric: She has a normal mood and affect. Her speech is normal and behavior is normal. Thought content normal.      Assessment & Plan:     1. Diabetic polyneuropathy associated with type 2 diabetes mellitus (HCC) Stable neuropathy  and sugar control. Tolerating Actos, Glipizide and Janumet without hypoglycemic episodes. Will continue present diet and medication regimen. Recheck labs and follow up in 6 months. - CBC with Differential/Platelet - Comprehensive metabolic panel - Hemoglobin A1c  2. Hyperlipemia Controlled with diet and Omega-3 Fish Oil daily. No statin yet. Continue diet and recheck labs. - Comprehensive metabolic panel - Lipid panel  3. Essential hypertension Stable and well controlled with Losartan and HCTZ. No muscle weakness or cramps. Recheck renal function and electrolytes. - Comprehensive metabolic panel       Vernie Murders, PA  [facility]

## 2016-05-27 ENCOUNTER — Other Ambulatory Visit: Payer: Self-pay

## 2016-05-27 DIAGNOSIS — G4700 Insomnia, unspecified: Secondary | ICD-10-CM

## 2016-05-27 NOTE — Telephone Encounter (Signed)
Pharmacy requesting refills. Thanks!  

## 2016-05-28 MED ORDER — LORAZEPAM 2 MG PO TABS: 2.0000 mg | ORAL_TABLET | Freq: Every evening | ORAL | 1 refills | Status: DC | PRN

## 2016-05-28 NOTE — Telephone Encounter (Signed)
Advise patient a refill of the Lorazepam has to be hand signed. Printed prescription will be at the front desk for pick up.

## 2016-05-28 NOTE — Telephone Encounter (Signed)
Patient advised and prescription placed up front for pick up. 

## 2016-06-19 LAB — CBC WITH DIFFERENTIAL/PLATELET
BASOS ABS: 0 10*3/uL (ref 0.0–0.2)
Basos: 0 %
EOS (ABSOLUTE): 0 10*3/uL (ref 0.0–0.4)
EOS: 0 %
Hematocrit: 37.9 % (ref 34.0–46.6)
Hemoglobin: 13 g/dL (ref 11.1–15.9)
IMMATURE GRANULOCYTES: 0 %
Immature Grans (Abs): 0 10*3/uL (ref 0.0–0.1)
LYMPHS ABS: 1.7 10*3/uL (ref 0.7–3.1)
Lymphs: 17 %
MCH: 30.4 pg (ref 26.6–33.0)
MCHC: 34.3 g/dL (ref 31.5–35.7)
MCV: 89 fL (ref 79–97)
MONOCYTES: 5 %
MONOS ABS: 0.5 10*3/uL (ref 0.1–0.9)
NEUTROS PCT: 78 %
Neutrophils Absolute: 7.9 10*3/uL — ABNORMAL HIGH (ref 1.4–7.0)
Platelets: 247 10*3/uL (ref 150–379)
RBC: 4.27 x10E6/uL (ref 3.77–5.28)
RDW: 13.6 % (ref 12.3–15.4)
WBC: 10.1 10*3/uL (ref 3.4–10.8)

## 2016-06-19 LAB — HEMOGLOBIN A1C
ESTIMATED AVERAGE GLUCOSE: 134 mg/dL
HEMOGLOBIN A1C: 6.3 % — AB (ref 4.8–5.6)

## 2016-06-19 LAB — LIPID PANEL
CHOLESTEROL TOTAL: 165 mg/dL (ref 100–199)
Chol/HDL Ratio: 3.2 ratio units (ref 0.0–4.4)
HDL: 52 mg/dL (ref >39)
LDL Calculated: 84 mg/dL (ref 0–99)
TRIGLYCERIDES: 143 mg/dL (ref 0–149)
VLDL Cholesterol Cal: 29 mg/dL (ref 5–40)

## 2016-06-19 LAB — COMPREHENSIVE METABOLIC PANEL
ALK PHOS: 85 IU/L (ref 39–117)
ALT: 48 IU/L — AB (ref 0–32)
AST: 28 IU/L (ref 0–40)
Albumin/Globulin Ratio: 2 (ref 1.2–2.2)
Albumin: 4.2 g/dL (ref 3.6–4.8)
BUN/Creatinine Ratio: 19 (ref 12–28)
BUN: 17 mg/dL (ref 8–27)
Bilirubin Total: 0.5 mg/dL (ref 0.0–1.2)
CALCIUM: 10.2 mg/dL (ref 8.7–10.3)
CO2: 24 mmol/L (ref 18–29)
CREATININE: 0.89 mg/dL (ref 0.57–1.00)
Chloride: 101 mmol/L (ref 96–106)
GFR calc Af Amer: 81 mL/min/{1.73_m2} (ref >59)
GFR, EST NON AFRICAN AMERICAN: 71 mL/min/{1.73_m2} (ref >59)
GLUCOSE: 130 mg/dL — AB (ref 65–99)
Globulin, Total: 2.1 g/dL (ref 1.5–4.5)
Potassium: 4.4 mmol/L (ref 3.5–5.2)
SODIUM: 140 mmol/L (ref 134–144)
Total Protein: 6.3 g/dL (ref 6.0–8.5)

## 2016-06-25 NOTE — Telephone Encounter (Signed)
Patient advised as below.  

## 2016-06-25 NOTE — Telephone Encounter (Signed)
-----   Message from Margo Common, [place] sent at 06/22/2016  3:46 PM EDT ----- Blood sugar, Hgb A1C and liver enzymes improved. Continue present medications and diet. Recheck in 6 months as planned.

## 2016-06-28 ENCOUNTER — Encounter: Payer: Self-pay | Admitting: Family Medicine

## 2016-06-28 ENCOUNTER — Ambulatory Visit (INDEPENDENT_AMBULATORY_CARE_PROVIDER_SITE_OTHER): Payer: 59 | Admitting: Family Medicine

## 2016-06-28 VITALS — BP 122/70 | HR 76 | Temp 97.6°F | Wt 253.8 lb

## 2016-06-28 DIAGNOSIS — E669 Obesity, unspecified: Secondary | ICD-10-CM

## 2016-06-28 DIAGNOSIS — R609 Edema, unspecified: Secondary | ICD-10-CM | POA: Diagnosis not present

## 2016-06-28 NOTE — Progress Notes (Signed)
Patient: Melody Brooks Female    DOB: 05-30-1956   60 y.o.   MRN: [id number] Visit Date: 06/28/2016  Today's Provider: Vernie Murders, PA   No chief complaint on file.  Subjective:    HPI Patient is here to discuss left ankle swelling for several months. Patient reports swelling occurs when she is working and traveling. Patient states when she travels the swelling is worse. Compression stockings help with the swelling. Patient is also requesting a appeals form be completed for insurance due to BMI being out of range.   Past Medical History:  Diagnosis Date  . Anemia   . Angioedema   . Arthritis   . Constipation   . Diabetes mellitus    Patient takes Janumet and Actos.  . Fatigue   . Fibromuscular dysplasia ([place])   . Generalized headaches   . GERD (gastroesophageal reflux disease)   . Hemorrhoids   . Hiatal hernia   . Hypertension   . Neuropathy ([place])   . Palpitations   . Rectal bleeding   . Rectal pain    Past Surgical History:  Procedure Laterality Date  . colonoscopy  2010  . EXCISIONAL HEMORRHOIDECTOMY  03/2012  . right and left eye cataract surgery  2006, 2008   right in 2006 and left 2008  . TUBAL LIGATION  1995   Family History  Problem Relation Age of Onset  . Heart failure Mother   . COPD Mother   . Hypertension Mother   . Arthritis Mother   . Cancer Father     prostate  . Parkinson's disease Father   . Cancer Maternal Aunt     colon  . Cancer Paternal Grandmother     stomach  . Diabetes Brother   . Diabetes Brother    Allergies  Allergen Reactions  . Lisinopril Swelling and Hives    Angioedema    Previous Medications   ACETAMINOPHEN (TYLENOL) 325 MG TABLET    Take 1 tablet by mouth as needed.   ALCOHOL SWABS PADS    Used to check blood sugars 3-4 times a day. Dx E11.9.   ASPIRIN 81 MG TABLET    Take 81 mg by mouth daily.   CHOLECALCIFEROL (VITAMIN D) 1000 UNITS TABLET    Take 1 tablet by mouth  daily   COLCHICINE 0.6 MG TABLET    Take 0.6  mg by mouth daily.    CYANOCOBALAMIN (,VITAMIN B-12,) 1000 MCG/ML INJECTION    Inject 1 mL (1,000 mcg total) into the muscle every 30 (thirty) days. Please send syringes also   DEXLANSOPRAZOLE (DEXILANT) 60 MG CAPSULE    Take 60 mg by mouth daily.   EPIPEN 2-PAK 0.3 MG/0.3ML SOAJ INJECTION    as needed.   ESTROGEN, CONJUGATED,-MEDROXYPROGESTERONE (PREMPRO) 0.3-1.5 MG PER TABLET    Take 1 tablet by mouth daily.   FERROUS FUMARATE (FERROCITE) 325 (106 FE) MG TABS    Take 1 tablet by mouth daily.    GLIPIZIDE XL 2.5 MG 24 HR TABLET    Take 1 tablet by mouth  daily with breakfast   GLUCOSE BLOOD (ONE TOUCH ULTRA TEST) TEST STRIP    To check blood sugars 3-4 times a day. Dx: E11.9.   HYDROCHLOROTHIAZIDE (MICROZIDE) 12.5 MG CAPSULE    Take 1 capsule by mouth  daily   HYDROCORTISONE (ANUSOL-HC) 25 MG SUPPOSITORY    Place 1 suppository (25 mg total) rectally 2 (two) times daily.   IBUPROFEN (ADVIL,MOTRIN) 200 MG TABLET  IBUPROFEN, 200MG  (Oral Tablet)  2 to 4 tabs three times a day as needed for 0 days  Quantity: 60.00;  Refills: 0   Ordered :28-Aug-2010  Margarita Rana MD;  Started 23-April-2010 Active Comments: Medication taken as needed. DX: 451.9   JANUMET 50-1000 MG TABLET    Take 1 tablet by mouth two  times daily   LANCETS (ONETOUCH ULTRASOFT) LANCETS    To check blood sugar 3-4 times a day  DX: E11.9   LEVOCETIRIZINE (XYZAL) 5 MG TABLET    Take 1 tablet by mouth daily.   LIDOCAINE (XYLOCAINE) 5 % OINTMENT    APPLY EXTERNALLY 3 TIMES A DAY AS NEEDED   LORAZEPAM (ATIVAN) 2 MG TABLET    Take 1 tablet (2 mg total) by mouth at bedtime as needed.   LOSARTAN (COZAAR) 50 MG TABLET    Take 1 tablet by mouth  daily   MONTELUKAST (SINGULAIR) 10 MG TABLET    Take 1 tablet by mouth daily.   OMEGA-3 FATTY ACIDS (FISH OIL) 1200 MG CAPS    Take by mouth daily. Reported on 12/26/2015   PIOGLITAZONE (ACTOS) 45 MG TABLET    Take 1 tablet by mouth  every night at bedtime   PREGABALIN (LYRICA) 100 MG CAPSULE     Take 1 capsule (100 mg total) by mouth 3 (three) times daily.   RANITIDINE (ZANTAC) 150 MG CAPSULE    Take 150 mg by mouth at bedtime.   ZOLPIDEM (AMBIEN) 5 MG TABLET    Take 1 tablet (5 mg total) by mouth at bedtime as needed.    Review of Systems  Constitutional: Negative.   Respiratory: Negative.   Cardiovascular: Positive for leg swelling.    Social History  Substance Use Topics  . Smoking status: Never Smoker  . Smokeless tobacco: Never Used     Comment: tobacco use- no  . Alcohol use No   Objective:   BP 122/70 (BP Location: Right Arm, Patient Position: Sitting, Cuff Size: Large)   Pulse 76   Temp 97.6 F (36.4 C) (Oral)   Wt 253 lb 12.8 oz (115.1 kg)   LMP 03/26/2012 (Approximate)   BMI 38.59 kg/m   Physical Exam  Constitutional: She is oriented to person, place, and time. She appears well-developed and well-nourished. No distress.  HENT:  Head: Normocephalic and atraumatic.  Right Ear: Hearing normal.  Left Ear: Hearing normal.  Nose: Nose normal.  Eyes: Conjunctivae and lids are normal. Right eye exhibits no discharge. Left eye exhibits no discharge. No scleral icterus.  Cardiovascular: Normal rate and regular rhythm.   Pulmonary/Chest: Effort normal. No respiratory distress.  Musculoskeletal: Normal range of motion.  Mild left ankle edema when leg is maintained in a dependent position. Normal pulses with some superficial varicose veins.  Neurological: She is alert and oriented to person, place, and time.  Skin: Skin is intact. No lesion and no rash noted.  Psychiatric: She has a normal mood and affect. Her speech is normal and behavior is normal. Thought content normal.      Assessment & Plan:     1. Accumulation of fluid in tissues  Intermittent left ankle edema. Suspect venous insufficiency versus mild lymphedema. Continues to use HCTZ for edema and hypertension. Encouraged to use compression stockings/support hose to decrease recurrences. Recheck  prn.  2. Obesity, Class II, BMI 35-39.9 Encouraged to work on diabetic diet, diabetes medication and regular exercise 4-5 days a week to lose weight. Has completed diabetic coaching, diet counseling  and health eating & exercise programs. Will follow up in 3 months to gauge progress.

## 2016-08-05 ENCOUNTER — Encounter: Payer: Self-pay | Admitting: Family Medicine

## 2016-08-12 ENCOUNTER — Telehealth: Payer: Self-pay | Admitting: Cardiovascular Disease

## 2016-08-12 NOTE — Telephone Encounter (Signed)
Attempts to schedle fu from recall list.  Deleting recall.

## 2016-09-08 ENCOUNTER — Other Ambulatory Visit: Payer: Self-pay

## 2016-09-08 DIAGNOSIS — G609 Hereditary and idiopathic neuropathy, unspecified: Secondary | ICD-10-CM

## 2016-09-08 DIAGNOSIS — G4700 Insomnia, unspecified: Secondary | ICD-10-CM

## 2016-09-08 NOTE — Telephone Encounter (Signed)
Refill request received from [facility] requesting Lyrica and Zolpidem

## 2016-09-10 MED ORDER — PREGABALIN 100 MG PO CAPS: 100.0000 mg | ORAL_CAPSULE | Freq: Three times a day (TID) | ORAL | 1 refills | Status: DC

## 2016-09-10 MED ORDER — ZOLPIDEM TARTRATE 5 MG PO TABS: 5.0000 mg | ORAL_TABLET | Freq: Every evening | ORAL | 1 refills | Status: DC | PRN

## 2016-09-10 NOTE — Telephone Encounter (Signed)
Phone in refills of the Lyrica and Ambien to [facility].

## 2016-09-17 ENCOUNTER — Other Ambulatory Visit: Payer: Self-pay | Admitting: Family Medicine

## 2016-09-17 DIAGNOSIS — E1142 Type 2 diabetes mellitus with diabetic polyneuropathy: Secondary | ICD-10-CM

## 2016-09-20 NOTE — Telephone Encounter (Signed)
Please review-aa 

## 2016-10-22 ENCOUNTER — Ambulatory Visit: Payer: 59 | Admitting: Family Medicine

## 2016-10-29 ENCOUNTER — Ambulatory Visit: Payer: 59 | Admitting: Family Medicine

## 2016-10-30 ENCOUNTER — Other Ambulatory Visit: Payer: Self-pay | Admitting: Family Medicine

## 2016-10-30 DIAGNOSIS — E559 Vitamin D deficiency, unspecified: Secondary | ICD-10-CM

## 2016-11-07 ENCOUNTER — Other Ambulatory Visit: Payer: Self-pay | Admitting: Family Medicine

## 2016-11-07 DIAGNOSIS — E1142 Type 2 diabetes mellitus with diabetic polyneuropathy: Secondary | ICD-10-CM

## 2016-11-08 ENCOUNTER — Other Ambulatory Visit: Payer: Self-pay | Admitting: Family Medicine

## 2016-11-08 DIAGNOSIS — E1142 Type 2 diabetes mellitus with diabetic polyneuropathy: Secondary | ICD-10-CM

## 2016-11-09 NOTE — Telephone Encounter (Signed)
Has appointment 12/24/2016. Renaldo Fiddler, CMA

## 2016-11-23 ENCOUNTER — Other Ambulatory Visit: Payer: Self-pay | Admitting: Family Medicine

## 2016-11-23 DIAGNOSIS — I10 Essential (primary) hypertension: Secondary | ICD-10-CM

## 2016-12-14 ENCOUNTER — Other Ambulatory Visit: Payer: Self-pay | Admitting: Family Medicine

## 2016-12-14 DIAGNOSIS — G4700 Insomnia, unspecified: Secondary | ICD-10-CM

## 2016-12-14 MED ORDER — LORAZEPAM 2 MG PO TABS: 2.0000 mg | ORAL_TABLET | Freq: Every evening | ORAL | 1 refills | Status: DC | PRN

## 2016-12-24 ENCOUNTER — Ambulatory Visit (INDEPENDENT_AMBULATORY_CARE_PROVIDER_SITE_OTHER): Payer: 59 | Admitting: Family Medicine

## 2016-12-24 ENCOUNTER — Encounter: Payer: Self-pay | Admitting: Family Medicine

## 2016-12-24 VITALS — BP 142/88 | HR 79 | Temp 97.7°F | Resp 16 | Ht 67.75 in | Wt 262.2 lb

## 2016-12-24 DIAGNOSIS — I10 Essential (primary) hypertension: Secondary | ICD-10-CM

## 2016-12-24 DIAGNOSIS — Z114 Encounter for screening for human immunodeficiency virus [HIV]: Secondary | ICD-10-CM | POA: Diagnosis not present

## 2016-12-24 DIAGNOSIS — R002 Palpitations: Secondary | ICD-10-CM

## 2016-12-24 DIAGNOSIS — E1142 Type 2 diabetes mellitus with diabetic polyneuropathy: Secondary | ICD-10-CM

## 2016-12-24 DIAGNOSIS — Z0000 Encounter for general adult medical examination without abnormal findings: Secondary | ICD-10-CM

## 2016-12-24 DIAGNOSIS — E785 Hyperlipidemia, unspecified: Secondary | ICD-10-CM

## 2016-12-24 DIAGNOSIS — Z1159 Encounter for screening for other viral diseases: Secondary | ICD-10-CM

## 2016-12-24 DIAGNOSIS — T783XXD Angioneurotic edema, subsequent encounter: Secondary | ICD-10-CM | POA: Diagnosis not present

## 2016-12-24 NOTE — Patient Instructions (Signed)

## 2016-12-24 NOTE — Progress Notes (Signed)
Patient: Melody Brooks, Female    DOB: 1955/11/19, 61 y.o.   MRN: [id number] Visit Date: 12/24/2016  Today's Provider: Vernie Murders, PA   Chief Complaint  Patient presents with  . Annual Exam   Subjective:    Annual physical exam Melody Brooks is a 61 y.o. female who presents today for health maintenance and complete physical. She feels fairly well. She reports exercising daily waking dogs. She reports she is sleeping fair.  -----------------------------------------------------------------   Review of Systems  Constitutional: Positive for fatigue and unexpected weight change.  HENT: Negative.   Eyes: Negative.   Respiratory: Positive for shortness of breath.   Cardiovascular: Positive for palpitations and leg swelling.  Gastrointestinal: Positive for abdominal distention.  Endocrine: Negative.   Genitourinary: Negative.   Musculoskeletal: Positive for arthralgias, back pain and myalgias.  Skin: Negative.   Allergic/Immunologic: Negative.        Angioedema   Neurological: Negative.   Hematological: Negative.   Psychiatric/Behavioral: Negative.     Social History      She  reports that she has never smoked. She has never used smokeless tobacco. She reports that she does not drink alcohol or use drugs.       Social History   Social History  . Marital status: Married    Spouse name: N/A  . Number of children: N/A  . Years of education: N/A   Social History Main Topics  . Smoking status: Never Smoker  . Smokeless tobacco: Never Used     Comment: tobacco use- no  . Alcohol use No  . Drug use: No  . Sexual activity: Not Asked   Other Topics Concern  . None   Social History Narrative   Full time. Married. Regularly exercises- walks.     Past Medical History:  Diagnosis Date  . Anemia   . Angioedema   . Arthritis   . Constipation   . Diabetes mellitus    Patient takes Janumet and Actos.  . Fatigue   . Fibromuscular dysplasia ([place])   .  Generalized headaches   . GERD (gastroesophageal reflux disease)   . Hemorrhoids   . Hiatal hernia   . Hypertension   . Neuropathy ([place])   . Palpitations   . Rectal bleeding   . Rectal pain      Patient Active Problem List   Diagnosis Date Noted  . Accumulation of fluid in tissues 07/08/2015  . Arterial fibromuscular dysplasia ([place]) 07/08/2015  . Acid reflux 07/08/2015  . Left arm pain 03/12/2015  . Obesity 03/12/2015  . Essential hypertension 03/12/2015  . Bursitis, trochanteric 12/12/2014  . Lumbosacral radiculitis 12/11/2014  . Angio-edema 05/24/2013  . Internal hemorrhoids with complication [id number]  . Hyperlipemia 12/04/2010  . PALPITATIONS 12/04/2010  . DYSPNEA 12/04/2010  . ABNORMAL EKG 12/04/2010  . Inflammation with thrombosis 04/23/2010  . Anemia, iron deficiency 03/06/2008  . B12 deficiency 03/06/2008  . Avitaminosis D 03/06/2008  . Insomnia 03/01/2008  . Idiopathic peripheral neuropathy 12/23/2003  . Diabetes mellitus with polyneuropathy ([place]) 07/17/2003  . Idiopathic scoliosis 07/30/2002    Past Surgical History:  Procedure Laterality Date  . colonoscopy  2010  . EXCISIONAL HEMORRHOIDECTOMY  03/2012  . right and left eye cataract surgery  2006, 2008   right in 2006 and left 2008  . TUBAL LIGATION  1995    Family History        Family Status  Relation Status  . Mother Deceased at  age 29   chf  . Father Alive   82; carotid and leg blockages.   . Maternal Aunt Alive  . Paternal Grandmother Deceased  . Brother Alive  . Sister Alive  . Sister Alive  . Brother Alive  . Brother Alive  . Brother Alive  . Brother Alive        Her family history includes Arthritis in her mother; COPD in her mother; Cancer in her father, maternal aunt, and paternal grandmother; Diabetes in her brother and brother; Heart failure in her mother; Hypertension in her mother; Parkinson's disease in her father.     Allergies  Allergen Reactions  . Lisinopril Swelling  and Hives    Angioedema     Current Outpatient Prescriptions:  .  acetaminophen (TYLENOL) 325 MG tablet, Take 1 tablet by mouth as needed., Disp: , Rfl:  .  Alcohol Swabs PADS, Used to check blood sugars 3-4 times a day. Dx E11.9., Disp: 360 each, Rfl: 3 .  aspirin 81 MG tablet, Take 81 mg by mouth daily., Disp: , Rfl:  .  cholecalciferol (VITAMIN D) 1000 units tablet, TAKE 1 TABLET BY MOUTH  DAILY, Disp: 100 tablet, Rfl: 1 .  colchicine 0.6 MG tablet, Take 0.6 mg by mouth daily. , Disp: , Rfl:  .  dexlansoprazole (DEXILANT) 60 MG capsule, Take 60 mg by mouth daily., Disp: , Rfl:  .  DiphenhydrAMINE HCl (BENADRYL ALLERGY PO), Take by mouth 2 (two) times daily., Disp: , Rfl:  .  EPIPEN 2-PAK 0.3 MG/0.3ML SOAJ injection, as needed., Disp: , Rfl:  .  estrogen, conjugated,-medroxyprogesterone (PREMPRO) 0.3-1.5 MG per tablet, Take 1 tablet by mouth daily., Disp: , Rfl:  .  ferrous fumarate (FERROCITE) 325 (106 FE) MG TABS, Take 1 tablet by mouth daily. , Disp: , Rfl:  .  glipiZIDE (GLUCOTROL XL) 2.5 MG 24 hr tablet, TAKE 1 TABLET BY MOUTH  DAILY WITH BREAKFAST, Disp: 90 tablet, Rfl: 3 .  hydrochlorothiazide (MICROZIDE) 12.5 MG capsule, Take 1 capsule by mouth  daily, Disp: 90 capsule, Rfl: 3 .  ibuprofen (ADVIL,MOTRIN) 200 MG tablet, IBUPROFEN, 200MG  (Oral Tablet)  2 to 4 tabs three times a day as needed for 0 days  Quantity: 60.00;  Refills: 0   Ordered :28-Aug-2010  Margarita Rana MD;  Started 23-April-2010 Active Comments: Medication taken as needed. DX: 451.9, Disp: , Rfl:  .  JANUMET 50-1000 MG tablet, TAKE 1 TABLET BY MOUTH TWO  TIMES DAILY, Disp: 180 tablet, Rfl: 3 .  Lancets (ONETOUCH ULTRASOFT) lancets, USE TO CHECK BLOOD SUGAR 3  TO 4 TIMES DAILY, Disp: 400 each, Rfl: 1 .  levocetirizine (XYZAL) 5 MG tablet, Take 1 tablet by mouth 2 (two) times daily. , Disp: , Rfl:  .  LORazepam (ATIVAN) 2 MG tablet, Take 1 tablet (2 mg total) by mouth at bedtime as needed., Disp: 90 tablet, Rfl: 1 .   losartan (COZAAR) 50 MG tablet, TAKE 1 TABLET BY MOUTH  DAILY, Disp: 90 tablet, Rfl: 1 .  Omega-3 Fatty Acids (FISH OIL) 1200 MG CAPS, Take by mouth daily. Reported on 12/26/2015, Disp: , Rfl:  .  ONE TOUCH ULTRA TEST test strip, TO CHECK BLOOD SUGARS 3-4  TIMES A DAY, Disp: 400 each, Rfl: 1 .  pioglitazone (ACTOS) 45 MG tablet, Take 1 tablet by mouth  every night at bedtime, Disp: 90 tablet, Rfl: 3 .  pregabalin (LYRICA) 100 MG capsule, Take 1 capsule (100 mg total) by mouth 3 (three) times daily., Disp:  270 capsule, Rfl: 1 .  ranitidine (ZANTAC) 150 MG capsule, Take 150 mg by mouth 2 (two) times daily. , Disp: , Rfl:  .  zolpidem (AMBIEN) 5 MG tablet, Take 1 tablet (5 mg total) by mouth at bedtime as needed., Disp: 90 tablet, Rfl: 1   Patient Care Team: Margo Common, PA as PCP - General (Family Medicine)      Objective:   Vitals: BP (!) 142/88 (BP Location: Right Arm, Patient Position: Sitting, Cuff Size: Normal)   Pulse 79   Temp 97.7 F (36.5 C) (Oral)   Resp 16   Ht 5' 7.75" (1.721 m)   Wt 262 lb 3.2 oz (118.9 kg)   LMP 03/26/2012 (Approximate)   SpO2 97%   BMI 40.16 kg/m   Wt Readings from Last 3 Encounters:  12/24/16 262 lb 3.2 oz (118.9 kg)  06/28/16 253 lb 12.8 oz (115.1 kg)  04/30/16 255 lb (115.7 kg)   BP Readings from Last 3 Encounters:  12/24/16 (!) 142/88  06/28/16 122/70  04/30/16 124/78    Physical Exam   Depression Screen PHQ 2/9 Scores 12/26/2015  PHQ - 2 Score 0  PHQ- 9 Score 5      Assessment & Plan:     Routine Health Maintenance and Physical Exam  Exercise Activities and Dietary recommendations Goals    None      Immunization History  Administered Date(s) Administered  . Pneumococcal Polysaccharide-23 09/01/2011  . Tdap 08/28/2010    Health Maintenance  Topic Date Due  . Hepatitis C Screening  [birth year]  . HIV Screening  01/24/1971  . PNEUMOCOCCAL POLYSACCHARIDE VACCINE (2) 08/31/2016  . HEMOGLOBIN A1C  12/19/2016  .  MAMMOGRAM  12/24/2017 (Originally 11/21/2015)  . PAP SMEAR  12/24/2017 (Originally 11/20/2016)  . FOOT EXAM  12/25/2016  . OPHTHALMOLOGY EXAM  07/30/2017  . TETANUS/TDAP  08/28/2020  . COLONOSCOPY  09/26/2022  . INFLUENZA VACCINE  Completed     Discussed health benefits of physical activity, and encouraged her to engage in regular exercise appropriate for her age and condition.    -------------------------------------------------------------------- 1. Annual physical exam General health essentially good.  Gets PAP and mammograms by [facility]. Had pneumovax 09-01-11 and Tdap 08-28-10. Wants to check with insurance about coverage for a Zostavax immunization. Check routine labs and follow up pending reports. - CBC With Differential - Comprehensive metabolic panel - TSH  2. Essential hypertension Fair control on HCTZ 12.5 mg qd. Needs to lose some weight, limit salt and caffeine in diet and continue present medication. Recheck routine labs. Cannot take ACE inhibitors due to angioedema reaction. - CBC With Differential  3. Palpitations Occasional palpitations and dyspnea with exertion. No chest pains or symptoms today. EKG essentially normal today. Will need to consider cardiology referral and stress test if any recurrence of symptoms. - EKG 12-Lead  4. Encounter for screening for HIV - HIV antibody  5. Need for hepatitis C screening test - Hepatitis C Antibody  6. Hyperlipidemia, unspecified hyperlipidemia type Past history of elevated lipids. Last check on 06-18-16 showed total cholesterol 165, HDL 52, LDL 84 and triglycerides 143. Diet and exercise by walking dogs has helped. Will recheck levels this year. - Lipid panel  7. Diabetic polyneuropathy associated with type 2 diabetes mellitus (HCC) FBS averages 120-130 at home. No hypoglycemia episodes. Still has peripheral numbness and spasms in toes to monofilament test today without lesions on feet.  Tolerating Glipizide 2.5 mg qd, Janumet 50-1000 mg BID,  Actos 45 mg each night and Lyrica 100 mg TID for neuropathic pain in feet. Recheck labs and follow low fat diabetic diet. Recheck pending reports. - CBC With Differential - Lipid panel - Comprehensive metabolic panel - Hemoglobin A1c  8. Angioedema, subsequent encounter Infrequent flares controlled with Xyzal BID. No recent dyspnea except with climbing a hill. No difficulty swallowing.    Vernie Murders, PA  [facility]

## 2016-12-25 LAB — COMPREHENSIVE METABOLIC PANEL
ALBUMIN: 4.1 g/dL (ref 3.6–4.8)
ALK PHOS: 79 IU/L (ref 39–117)
ALT: 40 IU/L — ABNORMAL HIGH (ref 0–32)
AST: 35 IU/L (ref 0–40)
Albumin/Globulin Ratio: 2.1 (ref 1.2–2.2)
BILIRUBIN TOTAL: 0.5 mg/dL (ref 0.0–1.2)
BUN / CREAT RATIO: 23 (ref 12–28)
BUN: 19 mg/dL (ref 8–27)
CHLORIDE: 102 mmol/L (ref 96–106)
CO2: 26 mmol/L (ref 18–29)
CREATININE: 0.84 mg/dL (ref 0.57–1.00)
Calcium: 9.9 mg/dL (ref 8.7–10.3)
GFR calc Af Amer: 87 mL/min/{1.73_m2} (ref >59)
GFR calc non Af Amer: 76 mL/min/{1.73_m2} (ref >59)
GLUCOSE: 116 mg/dL — AB (ref 65–99)
Globulin, Total: 2 g/dL (ref 1.5–4.5)
Potassium: 4.4 mmol/L (ref 3.5–5.2)
SODIUM: 144 mmol/L (ref 134–144)
Total Protein: 6.1 g/dL (ref 6.0–8.5)

## 2016-12-25 LAB — CBC WITH DIFFERENTIAL
BASOS: 0 %
Basophils Absolute: 0 10*3/uL (ref 0.0–0.2)
EOS (ABSOLUTE): 0.2 10*3/uL (ref 0.0–0.4)
EOS: 3 %
HEMATOCRIT: 37.6 % (ref 34.0–46.6)
HEMOGLOBIN: 12.6 g/dL (ref 11.1–15.9)
IMMATURE GRANS (ABS): 0 10*3/uL (ref 0.0–0.1)
Immature Granulocytes: 0 %
LYMPHS: 31 %
Lymphocytes Absolute: 2 10*3/uL (ref 0.7–3.1)
MCH: 30.3 pg (ref 26.6–33.0)
MCHC: 33.5 g/dL (ref 31.5–35.7)
MCV: 90 fL (ref 79–97)
MONOCYTES: 7 %
Monocytes Absolute: 0.4 10*3/uL (ref 0.1–0.9)
NEUTROS ABS: 3.8 10*3/uL (ref 1.4–7.0)
NEUTROS PCT: 59 %
RBC: 4.16 x10E6/uL (ref 3.77–5.28)
RDW: 13.6 % (ref 12.3–15.4)
WBC: 6.4 10*3/uL (ref 3.4–10.8)

## 2016-12-25 LAB — LIPID PANEL
CHOL/HDL RATIO: 2.9 ratio (ref 0.0–4.4)
Cholesterol, Total: 147 mg/dL (ref 100–199)
HDL: 51 mg/dL (ref >39)
LDL CALC: 71 mg/dL (ref 0–99)
Triglycerides: 126 mg/dL (ref 0–149)
VLDL CHOLESTEROL CAL: 25 mg/dL (ref 5–40)

## 2016-12-25 LAB — HEPATITIS C ANTIBODY: Hep C Virus Ab: <0.1 s/co ratio (ref 0.0–0.9)

## 2016-12-25 LAB — HEMOGLOBIN A1C
Est. average glucose Bld gHb Est-mCnc: 126 mg/dL
HEMOGLOBIN A1C: 6 % — AB (ref 4.8–5.6)

## 2016-12-25 LAB — TSH: TSH: 2.25 u[IU]/mL (ref 0.450–4.500)

## 2016-12-27 ENCOUNTER — Telehealth: Payer: Self-pay

## 2016-12-27 NOTE — Telephone Encounter (Signed)
lmtcb Emily Drozdowski, CMA  

## 2016-12-27 NOTE — Telephone Encounter (Signed)
-----   Message from Margo Common, [place] sent at 12/25/2016  9:33 AM EST ----- All blood tests are normal with only borderline blood sugar and very good Hgb A1C. Continue diabetes medications and work on some weight loss. Continue follow up with Dr. Manuella Ghazi and [facility] as planned. Follow up appointment here in 6 months.

## 2016-12-28 NOTE — Telephone Encounter (Signed)
Patient advised of lab results

## 2016-12-28 NOTE — Telephone Encounter (Signed)
Pt returned call, also wanted to inquire about labs for vit d and b  Please advise  [phone]  Thanks teri

## 2016-12-28 NOTE — Telephone Encounter (Signed)
LMTCB

## 2017-03-04 ENCOUNTER — Other Ambulatory Visit: Payer: Self-pay | Admitting: Family Medicine

## 2017-03-04 ENCOUNTER — Telehealth: Payer: Self-pay | Admitting: Family Medicine

## 2017-03-04 DIAGNOSIS — I10 Essential (primary) hypertension: Secondary | ICD-10-CM

## 2017-03-04 MED ORDER — HYDROCHLOROTHIAZIDE 12.5 MG PO CAPS: 12.5000 mg | ORAL_CAPSULE | Freq: Every day | ORAL | 3 refills | Status: DC

## 2017-03-04 NOTE — Telephone Encounter (Signed)
[facility] faxed a request for the following medication.  Thanks CC  hydrochlorothiazide (MICROZIDE) 12.5 MG capsule  Take 1 capsule by mouth daily.

## 2017-03-18 ENCOUNTER — Other Ambulatory Visit: Payer: Self-pay | Admitting: Family Medicine

## 2017-03-18 ENCOUNTER — Telehealth: Payer: Self-pay | Admitting: Family Medicine

## 2017-03-18 DIAGNOSIS — G4700 Insomnia, unspecified: Secondary | ICD-10-CM

## 2017-03-18 MED ORDER — ZOLPIDEM TARTRATE 5 MG PO TABS: 5.0000 mg | ORAL_TABLET | Freq: Every evening | ORAL | 1 refills | Status: DC | PRN

## 2017-03-18 NOTE — Telephone Encounter (Signed)
rx called in to [facility]

## 2017-03-18 NOTE — Telephone Encounter (Signed)
[facility] faxed a request on the following medication. Thanks CC  zolpidem (AMBIEN) 5 MG tablet

## 2017-03-18 NOTE — Telephone Encounter (Signed)
Refill placed in medication list to be phoned into [facility].

## 2017-04-15 ENCOUNTER — Encounter (INDEPENDENT_AMBULATORY_CARE_PROVIDER_SITE_OTHER): Payer: 59

## 2017-04-15 ENCOUNTER — Ambulatory Visit (INDEPENDENT_AMBULATORY_CARE_PROVIDER_SITE_OTHER): Payer: Self-pay | Admitting: Vascular Surgery

## 2017-04-22 ENCOUNTER — Other Ambulatory Visit: Payer: Self-pay | Admitting: Family Medicine

## 2017-04-22 DIAGNOSIS — E1142 Type 2 diabetes mellitus with diabetic polyneuropathy: Secondary | ICD-10-CM

## 2017-04-29 ENCOUNTER — Other Ambulatory Visit: Payer: Self-pay | Admitting: Family Medicine

## 2017-04-29 DIAGNOSIS — E559 Vitamin D deficiency, unspecified: Secondary | ICD-10-CM

## 2017-05-31 ENCOUNTER — Other Ambulatory Visit: Payer: Self-pay | Admitting: Family Medicine

## 2017-05-31 ENCOUNTER — Telehealth: Payer: Self-pay | Admitting: Family Medicine

## 2017-05-31 DIAGNOSIS — G609 Hereditary and idiopathic neuropathy, unspecified: Secondary | ICD-10-CM

## 2017-05-31 MED ORDER — PREGABALIN 100 MG PO CAPS: 100.0000 mg | ORAL_CAPSULE | Freq: Three times a day (TID) | ORAL | 1 refills | Status: DC

## 2017-05-31 NOTE — Telephone Encounter (Signed)
Sent refill to the [facility] pharmacy.

## 2017-05-31 NOTE — Telephone Encounter (Signed)
[facility] faxed a request for the following medication. Thanks CC  pregabalin (LYRICA) 100 MG capsule

## 2017-06-03 ENCOUNTER — Encounter: Payer: Self-pay | Admitting: Family Medicine

## 2017-06-03 ENCOUNTER — Other Ambulatory Visit: Payer: Self-pay | Admitting: Family Medicine

## 2017-06-03 ENCOUNTER — Ambulatory Visit (INDEPENDENT_AMBULATORY_CARE_PROVIDER_SITE_OTHER): Payer: 59 | Admitting: Family Medicine

## 2017-06-03 VITALS — BP 112/60 | HR 78 | Temp 97.7°F | Resp 16 | Wt 256.0 lb

## 2017-06-03 DIAGNOSIS — Z6839 Body mass index (BMI) 39.0-39.9, adult: Secondary | ICD-10-CM

## 2017-06-03 DIAGNOSIS — R399 Unspecified symptoms and signs involving the genitourinary system: Secondary | ICD-10-CM

## 2017-06-03 DIAGNOSIS — E1142 Type 2 diabetes mellitus with diabetic polyneuropathy: Secondary | ICD-10-CM | POA: Diagnosis not present

## 2017-06-03 LAB — POCT URINALYSIS DIPSTICK
BILIRUBIN UA: NEGATIVE
Blood, UA: NEGATIVE
GLUCOSE UA: NEGATIVE
KETONES UA: NEGATIVE
Nitrite, UA: NEGATIVE
Protein, UA: NEGATIVE
Spec Grav, UA: 1.02 (ref 1.010–1.025)
Urobilinogen, UA: 0.2 E.U./dL
pH, UA: 6 (ref 5.0–8.0)

## 2017-06-03 MED ORDER — SULFAMETHOXAZOLE-TRIMETHOPRIM 800-160 MG PO TABS: 1.0000 | ORAL_TABLET | Freq: Two times a day (BID) | ORAL | 0 refills | Status: DC

## 2017-06-03 MED ORDER — PREGABALIN 100 MG PO CAPS: 100.0000 mg | ORAL_CAPSULE | Freq: Three times a day (TID) | ORAL | 0 refills | Status: DC

## 2017-06-03 NOTE — Progress Notes (Signed)
Patient: Melody Brooks Female    DOB: 1956-03-28   61 y.o.   MRN: [id number] Visit Date: 06/03/2017  Today's Provider: Vernie Murders, PA   Chief Complaint  Patient presents with  . Follow-up    Needs refill on Lyrica and form filled out for appeal  . Urinary Tract Infection   Subjective:    HPI Pt is here today for a refill on her Lyrica. She will need it sent to her mail order pharamcy as well as her local because she is going out of town and will need it before she gets back.   Pt also needs an appeals form filled out for her employer.   Also pt is having UTI symptoms. She reports that it started about 3 days ago. She has dysuria, and pressure, urinary frequency, rentention, chills. No fever. She reports that she is better today but she is going out of town and wants to make sure that she has something incase she needs it.   Past Medical History:  Diagnosis Date  . Anemia   . Angioedema   . Arthritis   . Constipation   . Diabetes mellitus    Patient takes Janumet and Actos.  . Fatigue   . Fibromuscular dysplasia ([place])   . Generalized headaches   . GERD (gastroesophageal reflux disease)   . Hemorrhoids   . Hiatal hernia   . Hypertension   . Neuropathy   . Palpitations   . Rectal bleeding   . Rectal pain    Past Surgical History:  Procedure Laterality Date  . colonoscopy  2010  . EXCISIONAL HEMORRHOIDECTOMY  03/2012  . right and left eye cataract surgery  2006, 2008   right in 2006 and left 2008  . TUBAL LIGATION  1995   Family History  Problem Relation Age of Onset  . Heart failure Mother   . COPD Mother   . Hypertension Mother   . Arthritis Mother   . Cancer Father        prostate  . Parkinson's disease Father   . Cancer Maternal Aunt        colon  . Cancer Paternal Grandmother        stomach  . Diabetes Brother   . Diabetes Brother    Allergies  Allergen Reactions  . Lisinopril Swelling and Hives    Angioedema    Current Outpatient  Prescriptions:  .  acetaminophen (TYLENOL) 325 MG tablet, Take 1 tablet by mouth as needed., Disp: , Rfl:  .  Alcohol Swabs PADS, Used to check blood sugars 3-4 times a day. Dx E11.9., Disp: 360 each, Rfl: 3 .  aspirin 81 MG tablet, Take 81 mg by mouth daily., Disp: , Rfl:  .  cholecalciferol (VITAMIN D) 1000 units tablet, TAKE 1 TABLET BY MOUTH  DAILY, Disp: 100 tablet, Rfl: 3 .  dexlansoprazole (DEXILANT) 60 MG capsule, Take 60 mg by mouth daily., Disp: , Rfl:  .  DiphenhydrAMINE HCl (BENADRYL ALLERGY PO), Take by mouth 2 (two) times daily., Disp: , Rfl:  .  EPIPEN 2-PAK 0.3 MG/0.3ML SOAJ injection, as needed., Disp: , Rfl:  .  estrogen, conjugated,-medroxyprogesterone (PREMPRO) 0.3-1.5 MG per tablet, Take 1 tablet by mouth daily., Disp: , Rfl:  .  ferrous fumarate (FERROCITE) 325 (106 FE) MG TABS, Take 1 tablet by mouth daily. , Disp: , Rfl:  .  glipiZIDE (GLUCOTROL XL) 2.5 MG 24 hr tablet, TAKE 1 TABLET BY MOUTH  DAILY  WITH BREAKFAST, Disp: 90 tablet, Rfl: 3 .  glucose blood (ONE TOUCH ULTRA TEST) test strip, May recheck blood sugar up to 3 times a day if having hypoglycemic symptoms., Disp: 400 each, Rfl: 3 .  hydrochlorothiazide (MICROZIDE) 12.5 MG capsule, Take 1 capsule (12.5 mg total) by mouth daily., Disp: 90 capsule, Rfl: 3 .  ibuprofen (ADVIL,MOTRIN) 200 MG tablet, IBUPROFEN, 200MG  (Oral Tablet)  2 to 4 tabs three times a day as needed for 0 days  Quantity: 60.00;  Refills: 0   Ordered :28-Aug-2010  Margarita Rana MD;  Started 23-April-2010 Active Comments: Medication taken as needed. DX: 451.9, Disp: , Rfl:  .  JANUMET 50-1000 MG tablet, TAKE 1 TABLET BY MOUTH TWO  TIMES DAILY, Disp: 180 tablet, Rfl: 3 .  Lancets (ONETOUCH ULTRASOFT) lancets, May recheck blood sugar up to 3 times a day if having hypoglycemic symptoms., Disp: 400 each, Rfl: 3 .  levocetirizine (XYZAL) 5 MG tablet, Take 1 tablet by mouth 2 (two) times daily. , Disp: , Rfl:  .  LORazepam (ATIVAN) 2 MG tablet, Take 1 tablet  (2 mg total) by mouth at bedtime as needed., Disp: 90 tablet, Rfl: 1 .  losartan (COZAAR) 50 MG tablet, TAKE 1 TABLET BY MOUTH  DAILY, Disp: 90 tablet, Rfl: 1 .  pioglitazone (ACTOS) 45 MG tablet, Take 1 tablet by mouth  every night at bedtime, Disp: 90 tablet, Rfl: 3 .  pregabalin (LYRICA) 100 MG capsule, Take 1 capsule (100 mg total) by mouth 3 (three) times daily., Disp: 270 capsule, Rfl: 1 .  ranitidine (ZANTAC) 150 MG capsule, Take 150 mg by mouth 2 (two) times daily. , Disp: , Rfl:  .  zolpidem (AMBIEN) 5 MG tablet, Take 1 tablet (5 mg total) by mouth at bedtime as needed., Disp: 90 tablet, Rfl: 1 .  colchicine 0.6 MG tablet, Take 0.6 mg by mouth daily. , Disp: , Rfl:  .  Omega-3 Fatty Acids (FISH OIL) 1200 MG CAPS, Take by mouth daily. Reported on 12/26/2015, Disp: , Rfl:   Review of Systems  Constitutional: Positive for chills.  HENT: Negative.   Eyes: Negative.   Respiratory: Negative.   Cardiovascular: Negative.   Gastrointestinal: Negative.   Endocrine: Negative.   Genitourinary: Positive for dysuria, frequency, pelvic pain and urgency.  Musculoskeletal: Positive for back pain.  Skin: Negative.   Allergic/Immunologic: Negative.   Neurological: Negative.   Hematological: Negative.   Psychiatric/Behavioral: Negative.     Social History  Substance Use Topics  . Smoking status: Never Smoker  . Smokeless tobacco: Never Used     Comment: tobacco use- no  . Alcohol use No   Objective:   BP 112/60 (BP Location: Right Arm, Patient Position: Sitting, Cuff Size: Large)   Pulse 78   Temp 97.7 F (36.5 C) (Oral)   Resp 16   Wt 256 lb (116.1 kg)   LMP 03/26/2012 (Approximate)   BMI 39.21 kg/m  Vitals:   06/03/17 0946  BP: 112/60  Pulse: 78  Resp: 16  Temp: 97.7 F (36.5 C)  TempSrc: Oral  Weight: 256 lb (116.1 kg)   Wt Readings from Last 3 Encounters:  06/03/17 256 lb (116.1 kg)  12/24/16 262 lb 3.2 oz (118.9 kg)  06/28/16 253 lb 12.8 oz (115.1 kg)    Physical  Exam  Constitutional: She is oriented to person, place, and time. She appears well-developed and well-nourished.  HENT:  Head: Normocephalic.  Eyes: Conjunctivae are normal.  Neck: Neck supple.  Cardiovascular:  Normal rate and regular rhythm.   Pulmonary/Chest: Effort normal and breath sounds normal.  Abdominal: Soft. Bowel sounds are normal.  Musculoskeletal:  Diabetic Foot Exam - Simple   Simple Foot Form Diabetic Foot exam was performed with the following findings:  Yes  06/03/2017 10:34 AM  Visual Inspection Sensation Testing Pulse Check Comments: No sensation plantar surface of either foot. Numbness up to mid calf region.     Neurological: She is alert and oriented to person, place, and time.  Psychiatric: She has a normal mood and affect. Her behavior is normal.      Assessment & Plan:     1. UTI symptoms Developed urinary frequency, urgency and burning 3 days ago. No hematuria. Urinalysis showed many WBC's and large amount of bacteria. Encouraged to drink extra fluids and start antibiotic. Ordered C&S to identify pathogen. Recheck pending reports. - POCT urinalysis dipstick - Urine Culture - sulfamethoxazole-trimethoprim (BACTRIM DS,SEPTRA DS) 800-160 MG tablet; Take 1 tablet by mouth 2 (two) times daily.  Dispense: 20 tablet; Refill: 0  2. Diabetic polyneuropathy associated with type 2 diabetes mellitus (HCC) Unchanged pain and loss of sensation in feet up to mid calf region. The Lyrica helps to control discomfort. Usually gets the Lyrica from a mail order pharmacy but it has not come in yet and she is planning a trip out of town. Will send a 1 month prescription to her local pharmacy. Hgb A1C was 6.6% with creatinine 0.84 and glucose 140 on 04-23-17. Completed appeal for BMI out of range. Recheck in 3 months. - pregabalin (LYRICA) 100 MG capsule; Take 1 capsule (100 mg total) by mouth 3 (three) times daily.  Dispense: 90 capsule; Refill: 0  3. Class 2 severe obesity due to  excess calories with serious comorbidity and body mass index (BMI) of 39.0 to 39.9 in adult [facility])  Has lost 6 lbs since Feb. 2018. As active as possible with severe peripheral neuropathy and back pain. Working of the best diabetes control as possible with reduced calorie diabetic diet and diabetes medication. Continue weight loss efforts and recheck in 3 months.       Vernie Murders, PA  [facility]

## 2017-06-06 LAB — URINE CULTURE: ORGANISM ID, BACTERIA: NO GROWTH

## 2017-06-16 ENCOUNTER — Other Ambulatory Visit: Payer: Self-pay | Admitting: Family Medicine

## 2017-06-16 DIAGNOSIS — E1142 Type 2 diabetes mellitus with diabetic polyneuropathy: Secondary | ICD-10-CM

## 2017-06-16 DIAGNOSIS — G4700 Insomnia, unspecified: Secondary | ICD-10-CM

## 2017-06-16 NOTE — Telephone Encounter (Signed)
[facility] faxed a refill request on the following medications:  pregabalin (LYRICA) 100 MG capsule   LORazepam (ATIVAN) 2 MG tablet  [facility] mail order.    This is a pt of Dennis/MW

## 2017-06-16 NOTE — Telephone Encounter (Signed)
Please review. Thanks!  

## 2017-06-17 ENCOUNTER — Encounter: Payer: Self-pay | Admitting: Family Medicine

## 2017-06-17 LAB — HM MAMMOGRAPHY

## 2017-06-17 LAB — HM PAP SMEAR: HM PAP: NEGATIVE

## 2017-06-18 MED ORDER — PREGABALIN 100 MG PO CAPS: 100.0000 mg | ORAL_CAPSULE | Freq: Three times a day (TID) | ORAL | 5 refills | Status: DC

## 2017-06-18 MED ORDER — LORAZEPAM 2 MG PO TABS: 2.0000 mg | ORAL_TABLET | Freq: Every evening | ORAL | 1 refills | Status: DC | PRN

## 2017-07-29 LAB — HM DIABETES EYE EXAM

## 2017-08-21 ENCOUNTER — Other Ambulatory Visit: Payer: Self-pay | Admitting: Family Medicine

## 2017-08-21 DIAGNOSIS — E1142 Type 2 diabetes mellitus with diabetic polyneuropathy: Secondary | ICD-10-CM

## 2017-08-28 ENCOUNTER — Other Ambulatory Visit: Payer: Self-pay | Admitting: Family Medicine

## 2017-08-28 DIAGNOSIS — I10 Essential (primary) hypertension: Secondary | ICD-10-CM

## 2017-08-28 DIAGNOSIS — E1142 Type 2 diabetes mellitus with diabetic polyneuropathy: Secondary | ICD-10-CM

## 2017-08-29 ENCOUNTER — Other Ambulatory Visit: Payer: Self-pay | Admitting: Family Medicine

## 2017-08-29 DIAGNOSIS — E1142 Type 2 diabetes mellitus with diabetic polyneuropathy: Secondary | ICD-10-CM

## 2017-08-29 MED ORDER — PREGABALIN 100 MG PO CAPS: 100.0000 mg | ORAL_CAPSULE | Freq: Three times a day (TID) | ORAL | 1 refills | Status: DC

## 2017-08-29 NOTE — Telephone Encounter (Signed)
Advise patient prescription printed for pick up because it is a scheduled drug needing a signature.

## 2017-08-29 NOTE — Telephone Encounter (Signed)
Pt is requesting the Rx for pregabalin (LYRICA) 100 MG capsule resent to [facility] mail order for a 90 day supply for each refill with addition refills.  CB#[phone]/MW

## 2017-08-30 ENCOUNTER — Other Ambulatory Visit: Payer: Self-pay | Admitting: Family Medicine

## 2017-08-30 DIAGNOSIS — E1142 Type 2 diabetes mellitus with diabetic polyneuropathy: Secondary | ICD-10-CM

## 2017-08-30 DIAGNOSIS — G4700 Insomnia, unspecified: Secondary | ICD-10-CM

## 2017-08-30 MED ORDER — ZOLPIDEM TARTRATE 5 MG PO TABS: 5.0000 mg | ORAL_TABLET | Freq: Every evening | ORAL | 1 refills | Status: DC | PRN

## 2017-08-30 MED ORDER — PIOGLITAZONE HCL 45 MG PO TABS: 45.0000 mg | ORAL_TABLET | Freq: Every day | ORAL | 3 refills | Status: DC

## 2017-08-30 MED ORDER — RANITIDINE HCL 150 MG PO CAPS: 150.0000 mg | ORAL_CAPSULE | Freq: Two times a day (BID) | ORAL | 3 refills | Status: DC

## 2017-08-30 NOTE — Telephone Encounter (Signed)
[facility] faxed a request for the following medications. Thanks CC  zolpidem (AMBIEN) 5 MG tablet  pioglitazone (ACTOS) 45 MG tablet  ranitidine (ZANTAC) 150 MG capsule

## 2017-08-30 NOTE — Telephone Encounter (Signed)
Phone in refill of the Zolpidem. The Actos and Zantac faxed.

## 2017-08-30 NOTE — Telephone Encounter (Signed)
RX called in at [facility]

## 2017-11-17 ENCOUNTER — Other Ambulatory Visit: Payer: Self-pay | Admitting: Family Medicine

## 2017-11-17 DIAGNOSIS — I10 Essential (primary) hypertension: Secondary | ICD-10-CM

## 2017-12-02 ENCOUNTER — Encounter: Payer: Self-pay | Admitting: Family Medicine

## 2017-12-02 ENCOUNTER — Ambulatory Visit: Payer: Managed Care, Other (non HMO) | Admitting: Family Medicine

## 2017-12-02 VITALS — BP 138/84 | HR 78 | Temp 98.0°F | Wt 264.2 lb

## 2017-12-02 DIAGNOSIS — G4700 Insomnia, unspecified: Secondary | ICD-10-CM

## 2017-12-02 DIAGNOSIS — Z23 Encounter for immunization: Secondary | ICD-10-CM

## 2017-12-02 DIAGNOSIS — E1142 Type 2 diabetes mellitus with diabetic polyneuropathy: Secondary | ICD-10-CM

## 2017-12-02 DIAGNOSIS — E538 Deficiency of other specified B group vitamins: Secondary | ICD-10-CM | POA: Diagnosis not present

## 2017-12-02 DIAGNOSIS — I10 Essential (primary) hypertension: Secondary | ICD-10-CM | POA: Diagnosis not present

## 2017-12-02 DIAGNOSIS — E559 Vitamin D deficiency, unspecified: Secondary | ICD-10-CM | POA: Diagnosis not present

## 2017-12-02 DIAGNOSIS — Z87898 Personal history of other specified conditions: Secondary | ICD-10-CM

## 2017-12-02 DIAGNOSIS — Z114 Encounter for screening for human immunodeficiency virus [HIV]: Secondary | ICD-10-CM | POA: Diagnosis not present

## 2017-12-02 MED ORDER — LORAZEPAM 2 MG PO TABS: 2.0000 mg | ORAL_TABLET | Freq: Every evening | ORAL | 1 refills | Status: DC | PRN

## 2017-12-02 NOTE — Progress Notes (Addendum)
Patient: Melody Brooks Female    DOB: Sep 03, 1956   62 y.o.   MRN: [id number] Visit Date: 12/02/2017  Today's Provider: Vernie Murders, PA   Chief Complaint  Patient presents with  . Leg Swelling   Subjective:    HPI Patient presents today with left leg swelling that is worsening. Patient reports swelling occurs when she is working and traveling. Patient states swelling is worse in the afternoons. Compression stockings help with the swelling some, but makes her feet sore due to neuropathy.  Patient is also requesting Vitamin B12, Iron, Folate and Vitamin D labs. Patient states labs have not been checked in a while.   Patient is also requesting a repeat CT chest scan. Last CT was done on 01/05/16. Patient states she was advised to follow up in 1-2 years for a repeat CT for nodule and fatty liver.     Past Medical History:  Diagnosis Date  . Anemia   . Angioedema   . Arthritis   . Constipation   . Diabetes mellitus    Patient takes Janumet and Actos.  . Fatigue   . Fibromuscular dysplasia ([place])   . Generalized headaches   . GERD (gastroesophageal reflux disease)   . Hemorrhoids   . Hiatal hernia   . Hypertension   . Neuropathy   . Palpitations   . Rectal bleeding   . Rectal pain    Past Surgical History:  Procedure Laterality Date  . colonoscopy  2010  . EXCISIONAL HEMORRHOIDECTOMY  03/2012  . right and left eye cataract surgery  2006, 2008   right in 2006 and left 2008  . TUBAL LIGATION  1995   Family History  Problem Relation Age of Onset  . Heart failure Mother   . COPD Mother   . Hypertension Mother   . Arthritis Mother   . Cancer Father        prostate  . Parkinson's disease Father   . Cancer Maternal Aunt        colon  . Cancer Paternal Grandmother        stomach  . Diabetes Brother   . Diabetes Brother    Allergies  Allergen Reactions  . Lisinopril Swelling and Hives    Angioedema Angioedema    Current Outpatient Medications:  .   acetaminophen (TYLENOL) 325 MG tablet, Take 1 tablet by mouth as needed., Disp: , Rfl:  .  Alcohol Swabs PADS, Used to check blood sugars 3-4 times a day. Dx E11.9., Disp: 360 each, Rfl: 3 .  aspirin 81 MG tablet, Take 81 mg by mouth daily., Disp: , Rfl:  .  cholecalciferol (VITAMIN D) 1000 units tablet, TAKE 1 TABLET BY MOUTH  DAILY, Disp: 100 tablet, Rfl: 3 .  colchicine 0.6 MG tablet, Take 0.6 mg by mouth daily. , Disp: , Rfl:  .  dexlansoprazole (DEXILANT) 60 MG capsule, Take 60 mg by mouth daily., Disp: , Rfl:  .  DiphenhydrAMINE HCl (BENADRYL ALLERGY PO), Take by mouth 2 (two) times daily., Disp: , Rfl:  .  EPIPEN 2-PAK 0.3 MG/0.3ML SOAJ injection, as needed., Disp: , Rfl:  .  estrogen, conjugated,-medroxyprogesterone (PREMPRO) 0.3-1.5 MG per tablet, Take 1 tablet by mouth daily., Disp: , Rfl:  .  ferrous sulfate 325 (65 FE) MG EC tablet, Take by mouth., Disp: , Rfl:  .  glipiZIDE (GLUCOTROL XL) 2.5 MG 24 hr tablet, TAKE 1 TABLET BY MOUTH  DAILY WITH BREAKFAST, Disp: 90 tablet, Rfl:  0 .  glucose blood (ONE TOUCH ULTRA TEST) test strip, May recheck blood sugar up to 3 times a day if having hypoglycemic symptoms., Disp: 400 each, Rfl: 3 .  hydrochlorothiazide (MICROZIDE) 12.5 MG capsule, Take 1 capsule (12.5 mg total) by mouth daily., Disp: 90 capsule, Rfl: 3 .  hydroxychloroquine (PLAQUENIL) 200 MG tablet, Take by mouth., Disp: , Rfl:  .  ibuprofen (ADVIL,MOTRIN) 200 MG tablet, IBUPROFEN, 200MG  (Oral Tablet)  2 to 4 tabs three times a day as needed for 0 days  Quantity: 60.00;  Refills: 0   Ordered :28-Aug-2010  Margarita Rana MD;  Started 23-April-2010 Active Comments: Medication taken as needed. DX: 451.9, Disp: , Rfl:  .  JANUMET 50-1000 MG tablet, TAKE 1 TABLET BY MOUTH TWO  TIMES DAILY, Disp: 180 tablet, Rfl: 3 .  Lancets (ONETOUCH ULTRASOFT) lancets, May recheck blood sugar up to 3 times a day if having hypoglycemic symptoms., Disp: 400 each, Rfl: 3 .  levocetirizine (XYZAL) 5 MG tablet,  Take 1 tablet by mouth 2 (two) times daily. , Disp: , Rfl:  .  LORazepam (ATIVAN) 2 MG tablet, Take 1 tablet (2 mg total) by mouth at bedtime as needed., Disp: 90 tablet, Rfl: 1 .  losartan (COZAAR) 50 MG tablet, TAKE 1 TABLET BY MOUTH  DAILY (DUE FOR FOLLOW-UP  APPOINTMENT TO CHECK BLOOD  PRESSURE AND DIABETES), Disp: 90 tablet, Rfl: 0 .  methocarbamol (ROBAXIN) 750 MG tablet, TAKE 1/2 TO 1 TABLET BY MOUTH TWICE A DAY AS NEEDED, Disp: , Rfl: 5 .  Omega-3 Fatty Acids (FISH OIL) 1200 MG CAPS, Take by mouth daily. Reported on 12/26/2015, Disp: , Rfl:  .  pioglitazone (ACTOS) 45 MG tablet, Take 1 tablet (45 mg total) by mouth at bedtime., Disp: 90 tablet, Rfl: 3 .  pregabalin (LYRICA) 100 MG capsule, Take 1 capsule (100 mg total) by mouth 3 (three) times daily., Disp: 270 capsule, Rfl: 1 .  ranitidine (ZANTAC) 150 MG capsule, Take 1 capsule (150 mg total) by mouth 2 (two) times daily., Disp: 180 capsule, Rfl: 3 .  vitamin B-12 (CYANOCOBALAMIN) 1000 MCG tablet, Take by mouth., Disp: , Rfl:  .  zolpidem (AMBIEN) 5 MG tablet, Take 1 tablet (5 mg total) by mouth at bedtime as needed., Disp: 90 tablet, Rfl: 1  Review of Systems  Constitutional: Negative.   Respiratory: Negative.   Cardiovascular: Positive for leg swelling.   Social History   Tobacco Use  . Smoking status: Never Smoker  . Smokeless tobacco: Never Used  . Tobacco comment: tobacco use- no  Substance Use Topics  . Alcohol use: No   Objective:   BP 138/84 (BP Location: Right Arm, Patient Position: Sitting, Cuff Size: Normal)   Pulse 78   Temp 98 F (36.7 C) (Oral)   Wt 264 lb 3.2 oz (119.8 kg)   LMP 03/26/2012 (Approximate)   SpO2 98%   BMI 40.47 kg/m  Wt Readings from Last 3 Encounters:  12/02/17 264 lb 3.2 oz (119.8 kg)  06/03/17 256 lb (116.1 kg)  12/24/16 262 lb 3.2 oz (118.9 kg)   BP Readings from Last 3 Encounters:  12/02/17 138/84  06/03/17 112/60  12/24/16 (!) 142/88   Physical Exam  Constitutional: She is  oriented to person, place, and time. She appears well-developed and well-nourished. No distress.  HENT:  Head: Normocephalic and atraumatic.  Right Ear: Hearing normal.  Left Ear: Hearing normal.  Nose: Nose normal.  Eyes: Conjunctivae and lids are normal. Right eye exhibits  no discharge. Left eye exhibits no discharge. No scleral icterus.  Neck: Neck supple.  Cardiovascular: Normal rate and regular rhythm.  Pulmonary/Chest: Effort normal. No respiratory distress.  Abdominal: Soft. Bowel sounds are normal.  Musculoskeletal: Normal range of motion.  Neurological: She is alert and oriented to person, place, and time.  Skin: Skin is intact. No lesion and no rash noted.  Psychiatric: She has a normal mood and affect. Her speech is normal and behavior is normal. Thought content normal.      Assessment & Plan:     1. Insomnia, unspecified type With the use of Ativan at bedtime, she can go to sleep easily. Still having difficulty staying asleep. Will refill medication and recheck labs to rule out other metabolic disorders. - LORazepam (ATIVAN) 2 MG tablet; Take 1 tablet (2 mg total) by mouth at bedtime as needed.  Dispense: 90 tablet; Refill: 1  2. Essential hypertension BP with stable control taking the Losartan 50 mg qd and HCTZ 12.5 mg qd. Has gained 8 lbs since August 2018. Recommend she restrict caffeine and salt intake. Check routine labs and follow up pending reports. - CBC with Differential/Platelet - Comprehensive metabolic panel - Lipid panel  3. Diabetic polyneuropathy associated with type 2 diabetes mellitus (HCC) Tolerating the Actos 45 mg qd, Glipizide 2.5 mg qd and Janumet 50-1000 mg BID. FBS ranging from 100-180 at home. Denies polyuria, polydipsia or vision changes. Neuropathy worse in feet up to knees. Has husband help her with trimming nails and monitoring for skin lesions. Next foot exam due in 6-7 months and follow up with ophthalmologist in 7-8 months. Recheck labs and  follow up pending reports. - HgB A1c - CBC with Differential/Platelet - Comprehensive metabolic panel - Lipid panel - Microalbumen, urine  4. Avitaminosis D Still taking Vitamin-D 1000 IU qd. Due for follow up of blood level. - Vitamin D (25 hydroxy)  5. B12 deficiency History of severe deficiency several years ago. Will recheck levels with worsening of neuropathy. Should continue oral supplement daily. - Vitamin B12  6. History of solitary pulmonary nodule Asymptomatic. Documented in the [facility] in 2016. Will recheck CT scan to monitor for changes. - CT Chest Wo Contrast  7. Screening for HIV (human immunodeficiency virus) - HIV antibody  8. Varicella vaccination - Varicella-zoster vaccine IM (Shingrix)       Vernie Murders, PA  [facility]

## 2017-12-03 LAB — CBC WITH DIFFERENTIAL/PLATELET
Basophils Absolute: 0 10*3/uL (ref 0.0–0.2)
Basos: 0 %
EOS (ABSOLUTE): 0.2 10*3/uL (ref 0.0–0.4)
Eos: 3 %
Hematocrit: 36.9 % (ref 34.0–46.6)
Hemoglobin: 12.5 g/dL (ref 11.1–15.9)
IMMATURE GRANS (ABS): 0 10*3/uL (ref 0.0–0.1)
IMMATURE GRANULOCYTES: 0 %
LYMPHS: 26 %
Lymphocytes Absolute: 1.8 10*3/uL (ref 0.7–3.1)
MCH: 30.3 pg (ref 26.6–33.0)
MCHC: 33.9 g/dL (ref 31.5–35.7)
MCV: 89 fL (ref 79–97)
MONOS ABS: 0.5 10*3/uL (ref 0.1–0.9)
Monocytes: 7 %
NEUTROS PCT: 64 %
Neutrophils Absolute: 4.3 10*3/uL (ref 1.4–7.0)
PLATELETS: 269 10*3/uL (ref 150–379)
RBC: 4.13 x10E6/uL (ref 3.77–5.28)
RDW: 13.9 % (ref 12.3–15.4)
WBC: 6.9 10*3/uL (ref 3.4–10.8)

## 2017-12-03 LAB — HEMOGLOBIN A1C
ESTIMATED AVERAGE GLUCOSE: 148 mg/dL
Hgb A1c MFr Bld: 6.8 % — ABNORMAL HIGH (ref 4.8–5.6)

## 2017-12-03 LAB — LIPID PANEL
CHOL/HDL RATIO: 2.6 ratio (ref 0.0–4.4)
Cholesterol, Total: 134 mg/dL (ref 100–199)
HDL: 52 mg/dL (ref >39)
LDL CALC: 60 mg/dL (ref 0–99)
TRIGLYCERIDES: 110 mg/dL (ref 0–149)
VLDL CHOLESTEROL CAL: 22 mg/dL (ref 5–40)

## 2017-12-03 LAB — COMPREHENSIVE METABOLIC PANEL
ALT: 24 IU/L (ref 0–32)
AST: 20 IU/L (ref 0–40)
Albumin/Globulin Ratio: 1.8 (ref 1.2–2.2)
Albumin: 4.1 g/dL (ref 3.6–4.8)
Alkaline Phosphatase: 91 IU/L (ref 39–117)
BUN/Creatinine Ratio: 13 (ref 12–28)
BUN: 12 mg/dL (ref 8–27)
Bilirubin Total: 0.4 mg/dL (ref 0.0–1.2)
CALCIUM: 9.9 mg/dL (ref 8.7–10.3)
CO2: 23 mmol/L (ref 20–29)
CREATININE: 0.91 mg/dL (ref 0.57–1.00)
Chloride: 102 mmol/L (ref 96–106)
GFR, EST AFRICAN AMERICAN: 79 mL/min/{1.73_m2} (ref >59)
GFR, EST NON AFRICAN AMERICAN: 68 mL/min/{1.73_m2} (ref >59)
Globulin, Total: 2.3 g/dL (ref 1.5–4.5)
Glucose: 122 mg/dL — ABNORMAL HIGH (ref 65–99)
Potassium: 4.4 mmol/L (ref 3.5–5.2)
Sodium: 140 mmol/L (ref 134–144)
TOTAL PROTEIN: 6.4 g/dL (ref 6.0–8.5)

## 2017-12-03 LAB — VITAMIN D 25 HYDROXY (VIT D DEFICIENCY, FRACTURES): Vit D, 25-Hydroxy: 35.4 ng/mL (ref 30.0–100.0)

## 2017-12-03 LAB — HIV ANTIBODY (ROUTINE TESTING W REFLEX): HIV SCREEN 4TH GENERATION: NONREACTIVE

## 2017-12-03 LAB — VITAMIN B12: Vitamin B-12: 1950 pg/mL — ABNORMAL HIGH (ref 232–1245)

## 2017-12-03 LAB — MICROALBUMIN, URINE: MICROALBUM., U, RANDOM: 5.1 ug/mL

## 2017-12-05 ENCOUNTER — Other Ambulatory Visit: Payer: Self-pay | Admitting: Family Medicine

## 2017-12-05 DIAGNOSIS — E1142 Type 2 diabetes mellitus with diabetic polyneuropathy: Secondary | ICD-10-CM

## 2017-12-06 ENCOUNTER — Telehealth: Payer: Self-pay | Admitting: Family Medicine

## 2017-12-06 NOTE — Telephone Encounter (Signed)
Advise patient of denial by insurance. Can try to get a CXR approved if she desires. Since 2 CT scans and PET scan in the past couple years were negative for malignancy, don't really have to get tests.

## 2017-12-06 NOTE — Telephone Encounter (Signed)
FYI--Pt's insurance company denied approval for chest CT

## 2017-12-08 NOTE — Telephone Encounter (Signed)
LMTCB

## 2017-12-12 NOTE — Telephone Encounter (Signed)
Patient is returning your call and is requesting a call back.  She would like for you to call her at work at [phone] and leave a message if she is not available.

## 2017-12-13 ENCOUNTER — Other Ambulatory Visit (INDEPENDENT_AMBULATORY_CARE_PROVIDER_SITE_OTHER): Payer: Self-pay | Admitting: Vascular Surgery

## 2017-12-13 ENCOUNTER — Other Ambulatory Visit: Payer: Self-pay | Admitting: Family Medicine

## 2017-12-13 DIAGNOSIS — I739 Peripheral vascular disease, unspecified: Principal | ICD-10-CM

## 2017-12-13 DIAGNOSIS — I779 Disorder of arteries and arterioles, unspecified: Secondary | ICD-10-CM

## 2017-12-13 NOTE — Telephone Encounter (Signed)
Returned pt call she did receive voicemail and denies xray at this time.

## 2017-12-14 NOTE — Telephone Encounter (Signed)
Pharmacy requesting refills. Thanks!  

## 2017-12-16 ENCOUNTER — Ambulatory Visit (INDEPENDENT_AMBULATORY_CARE_PROVIDER_SITE_OTHER): Payer: Managed Care, Other (non HMO) | Admitting: Vascular Surgery

## 2017-12-16 ENCOUNTER — Ambulatory Visit (INDEPENDENT_AMBULATORY_CARE_PROVIDER_SITE_OTHER): Payer: Managed Care, Other (non HMO)

## 2017-12-16 ENCOUNTER — Encounter (INDEPENDENT_AMBULATORY_CARE_PROVIDER_SITE_OTHER): Payer: Self-pay | Admitting: Vascular Surgery

## 2017-12-16 VITALS — BP 130/72 | HR 75 | Resp 16 | Wt 261.4 lb

## 2017-12-16 DIAGNOSIS — I10 Essential (primary) hypertension: Secondary | ICD-10-CM | POA: Diagnosis not present

## 2017-12-16 DIAGNOSIS — I6529 Occlusion and stenosis of unspecified carotid artery: Secondary | ICD-10-CM | POA: Insufficient documentation

## 2017-12-16 DIAGNOSIS — I779 Disorder of arteries and arterioles, unspecified: Secondary | ICD-10-CM | POA: Diagnosis not present

## 2017-12-16 DIAGNOSIS — I6523 Occlusion and stenosis of bilateral carotid arteries: Secondary | ICD-10-CM

## 2017-12-16 DIAGNOSIS — E1142 Type 2 diabetes mellitus with diabetic polyneuropathy: Secondary | ICD-10-CM

## 2017-12-16 DIAGNOSIS — I739 Peripheral vascular disease, unspecified: Principal | ICD-10-CM

## 2017-12-16 HISTORY — DX: Occlusion and stenosis of unspecified carotid artery: I65.29

## 2017-12-16 NOTE — Assessment & Plan Note (Signed)
blood pressure control important in reducing the progression of atherosclerotic disease. On appropriate oral medications.  

## 2017-12-16 NOTE — Progress Notes (Signed)
MRN : [id number]  Melody Brooks is a 62 y.o. ([birth year]) female who presents with chief complaint of  Chief Complaint  Patient presents with  . Carotid    57yr follow up  .  History of Present Illness: As in follow-up of her carotid artery disease.  She has had no major changes or problems since her last visit well over a year ago.  She denies focal neurologic symptoms. Specifically, the patient denies amaurosis fugax, speech or swallowing difficulties, or arm or leg weakness or numbness.  Her carotid duplex today shows mild plaque and intimal thickening without any hemodynamically significant stenosis in either carotid artery.  Current Outpatient Medications  Medication Sig Dispense Refill  . acetaminophen (TYLENOL) 325 MG tablet Take 1 tablet by mouth as needed.    . Alcohol Swabs PADS Used to check blood sugars 3-4 times a day. Dx E11.9. 360 each 3  . aspirin 81 MG tablet Take 81 mg by mouth daily.    . cholecalciferol (VITAMIN D) 1000 units tablet TAKE 1 TABLET BY MOUTH  DAILY 100 tablet 3  . colchicine 0.6 MG tablet Take 0.6 mg by mouth daily.     Marland Kitchen dexlansoprazole (DEXILANT) 60 MG capsule Take 60 mg by mouth daily.    . DiphenhydrAMINE HCl (BENADRYL ALLERGY PO) Take by mouth 2 (two) times daily.    Marland Kitchen EPIPEN 2-PAK 0.3 MG/0.3ML SOAJ injection as needed.    Marland Kitchen estrogen, conjugated,-medroxyprogesterone (PREMPRO) 0.3-1.5 MG per tablet Take 1 tablet by mouth daily.    . ferrous sulfate 325 (65 FE) MG EC tablet Take by mouth.    Marland Kitchen glipiZIDE (GLUCOTROL XL) 2.5 MG 24 hr tablet TAKE 1 TABLET BY MOUTH  DAILY WITH BREAKFAST 90 tablet 1  . glucose blood (ONE TOUCH ULTRA TEST) test strip May recheck blood sugar up to 3 times a day if having hypoglycemic symptoms. 400 each 3  . hydrochlorothiazide (MICROZIDE) 12.5 MG capsule TAKE 1 CAPSULE BY MOUTH  DAILY 90 capsule 3  . hydroxychloroquine (PLAQUENIL) 200 MG tablet Take by mouth.    Marland Kitchen ibuprofen (ADVIL,MOTRIN) 200 MG tablet IBUPROFEN, 200MG   (Oral Tablet)  2 to 4 tabs three times a day as needed for 0 days  Quantity: 60.00;  Refills: 0   Ordered :28-Aug-2010  Margarita Rana MD;  Started 23-April-2010 Active Comments: Medication taken as needed. DX: 451.9    . JANUMET 50-1000 MG tablet TAKE 1 TABLET BY MOUTH TWO  TIMES DAILY 180 tablet 3  . Lancets (ONETOUCH ULTRASOFT) lancets May recheck blood sugar up to 3 times a day if having hypoglycemic symptoms. 400 each 3  . levocetirizine (XYZAL) 5 MG tablet Take 1 tablet by mouth 2 (two) times daily.     Marland Kitchen LORazepam (ATIVAN) 2 MG tablet Take 1 tablet (2 mg total) by mouth at bedtime as needed. 90 tablet 1  . losartan (COZAAR) 50 MG tablet TAKE 1 TABLET BY MOUTH  DAILY (DUE FOR FOLLOW-UP  APPOINTMENT TO CHECK BLOOD  PRESSURE AND DIABETES) 90 tablet 0  . methocarbamol (ROBAXIN) 750 MG tablet TAKE 1/2 TO 1 TABLET BY MOUTH TWICE A DAY AS NEEDED  5  . Omega-3 Fatty Acids (FISH OIL) 1200 MG CAPS Take by mouth daily. Reported on 12/26/2015    . pioglitazone (ACTOS) 45 MG tablet Take 1 tablet (45 mg total) by mouth at bedtime. 90 tablet 3  . pregabalin (LYRICA) 100 MG capsule Take 1 capsule (100 mg total) by mouth 3 (three) times daily.  270 capsule 1  . ranitidine (ZANTAC) 150 MG capsule Take 1 capsule (150 mg total) by mouth 2 (two) times daily. 180 capsule 3  . vitamin B-12 (CYANOCOBALAMIN) 1000 MCG tablet Take by mouth.    . zolpidem (AMBIEN) 5 MG tablet Take 1 tablet (5 mg total) by mouth at bedtime as needed. 90 tablet 1   No current facility-administered medications for this visit.     Past Medical History:  Diagnosis Date  . Anemia   . Angioedema   . Arthritis   . Constipation   . Diabetes mellitus    Patient takes Janumet and Actos.  . Fatigue   . Fibromuscular dysplasia ([place])   . Generalized headaches   . GERD (gastroesophageal reflux disease)   . Hemorrhoids   . Hiatal hernia   . Hypertension   . Neuropathy   . Palpitations   . Rectal bleeding   . Rectal pain     Past  Surgical History:  Procedure Laterality Date  . colonoscopy  2010  . EXCISIONAL HEMORRHOIDECTOMY  03/2012  . right and left eye cataract surgery  2006, 2008   right in 2006 and left 2008  . TUBAL LIGATION  1995    Social History Social History   Tobacco Use  . Smoking status: Never Smoker  . Smokeless tobacco: Never Used  . Tobacco comment: tobacco use- no  Substance Use Topics  . Alcohol use: No  . Drug use: No     Family History Family History  Problem Relation Age of Onset  . Heart failure Mother   . COPD Mother   . Hypertension Mother   . Arthritis Mother   . Cancer Father        prostate  . Parkinson's disease Father   . Cancer Maternal Aunt        colon  . Cancer Paternal Grandmother        stomach  . Diabetes Brother   . Diabetes Brother      Allergies  Allergen Reactions  . Lisinopril Swelling and Hives    Angioedema Angioedema     REVIEW OF SYSTEMS (Negative unless checked)  Constitutional: [] Weight loss  [] Fever  [] Chills Cardiac: [] Chest pain   [] Chest pressure   [] Palpitations   [] Shortness of breath when laying flat   [] Shortness of breath at rest   [] Shortness of breath with exertion. Vascular:  [] Pain in legs with walking   [] Pain in legs at rest   [] Pain in legs when laying flat   [] Claudication   [] Pain in feet when walking  [] Pain in feet at rest  [] Pain in feet when laying flat   [] History of DVT   [] Phlebitis   [] Swelling in legs   [] Varicose veins   [] Non-healing ulcers Pulmonary:   [] Uses home oxygen   [] Productive cough   [] Hemoptysis   [] Wheeze  [] COPD   [] Asthma Neurologic:  [] Dizziness  [] Blackouts   [] Seizures   [] History of stroke   [] History of TIA  [] Aphasia   [] Temporary blindness   [] Dysphagia   [] Weakness or numbness in arms   [] Weakness or numbness in legs Musculoskeletal:  [x] Arthritis   [] Joint swelling   [] Joint pain   [] Low back pain Hematologic:  [] Easy bruising  [] Easy bleeding   [] Hypercoagulable state   [x] Anemic   [] Hepatitis Gastrointestinal:  [x] Blood in stool   [] Vomiting blood  [x] Gastroesophageal reflux/heartburn   [] Difficulty swallowing. Genitourinary:  [] Chronic kidney disease   [] Difficult urination  [] Frequent urination  [] Burning  with urination   [] Blood in urine Skin:  [] Rashes   [] Ulcers   [] Wounds Psychological:  [] History of anxiety   []  History of major depression.  Physical Examination  Vitals:   12/16/17 0918 12/16/17 0919  BP: 134/68 130/72  Pulse: 75   Resp: 16   Weight: 118.6 kg (261 lb 6.4 oz)    Body mass index is 40.04 kg/m. Gen:  WD/WN, NAD Head: [place]/AT, No temporalis wasting. Ear/Nose/Throat: Hearing grossly intact, nares w/o erythema or drainage, trachea midline Eyes: Conjunctiva clear. Sclera non-icteric Neck: Supple.  No bruit or JVD.  Pulmonary:  Good air movement, equal and clear to auscultation bilaterally.  Cardiac: RRR, normal S1, S2 Vascular:  Vessel Right Left  Radial Palpable Palpable                                    Musculoskeletal: M/S 5/5 throughout.  No deformity or atrophy. Neurologic: CN 2-12 intact. Sensation grossly intact in extremities.  Symmetrical.  Speech is fluent. Motor exam as listed above. Psychiatric: Judgment intact, Mood & affect appropriate for pt's clinical situation. Dermatologic: No rashes or ulcers noted.  No cellulitis or open wounds.      CBC Lab Results  Component Value Date   WBC 6.9 12/02/2017   HGB 12.5 12/02/2017   HCT 36.9 12/02/2017   MCV 89 12/02/2017   PLT 269 12/02/2017    BMET    Component Value Date/Time   NA 140 12/02/2017 1052   K 4.4 12/02/2017 1052   CL 102 12/02/2017 1052   CO2 23 12/02/2017 1052   GLUCOSE 122 (H) 12/02/2017 1052   BUN 12 12/02/2017 1052   CREATININE 0.91 12/02/2017 1052   CALCIUM 9.9 12/02/2017 1052   GFRNONAA 68 12/02/2017 1052   GFRAA 79 12/02/2017 1052   CrCl cannot be calculated (Unknown ideal weight.).  COAG No results found for: INR,  PROTIME  Radiology No results found.    Assessment/Plan Essential hypertension blood pressure control important in reducing the progression of atherosclerotic disease. On appropriate oral medications.   Diabetes mellitus with polyneuropathy blood glucose control important in reducing the progression of atherosclerotic disease. Also, involved in wound healing. On appropriate medications.   Carotid stenosis Her carotid duplex today shows mild plaque and intimal thickening without any hemodynamically significant stenosis in either carotid artery.  On previous duplex studies a few years ago, there was suggestion this could be fibromuscular dysplasia.  Although that could still be the case, that is not as obvious on today's study.  At this point, we will just check her every other year with duplex.  Continue aspirin daily.  Contact our office with any problems in the interim.    Leotis Pain, MD  12/16/2017 10:50 AM    This note was created with Dragon medical transcription system.  Any errors from dictation are purely unintentional

## 2017-12-16 NOTE — Assessment & Plan Note (Signed)
blood glucose control important in reducing the progression of atherosclerotic disease. Also, involved in wound healing. On appropriate medications.  

## 2017-12-16 NOTE — Assessment & Plan Note (Signed)
Her carotid duplex today shows mild plaque and intimal thickening without any hemodynamically significant stenosis in either carotid artery.  On previous duplex studies a few years ago, there was suggestion this could be fibromuscular dysplasia.  Although that could still be the case, that is not as obvious on today's study.  At this point, we will just check her every other year with duplex.  Continue aspirin daily.  Contact our office with any problems in the interim.

## 2018-01-27 LAB — HM COLONOSCOPY

## 2018-01-30 ENCOUNTER — Other Ambulatory Visit: Payer: Self-pay | Admitting: Family Medicine

## 2018-01-30 DIAGNOSIS — I10 Essential (primary) hypertension: Secondary | ICD-10-CM

## 2018-01-31 ENCOUNTER — Ambulatory Visit: Payer: Managed Care, Other (non HMO) | Admitting: Family Medicine

## 2018-02-20 ENCOUNTER — Telehealth: Payer: Self-pay | Admitting: Family Medicine

## 2018-02-20 NOTE — Telephone Encounter (Signed)
Pt called to reschedule her 2nd Shingrix shot. Pt's appt for 01/31/18 was canceled b/c we didn't have the vaccine. Pt is requesting a call back to get appt schedule. I wasn't sure if we had any in the office. Please advise. Thanks TNP

## 2018-02-21 NOTE — Telephone Encounter (Signed)
Left message for patient to call office to schedule appointment for second shingrix vaccine.

## 2018-02-24 ENCOUNTER — Other Ambulatory Visit: Payer: Self-pay | Admitting: Family Medicine

## 2018-02-24 DIAGNOSIS — E1142 Type 2 diabetes mellitus with diabetic polyneuropathy: Secondary | ICD-10-CM

## 2018-03-03 ENCOUNTER — Ambulatory Visit: Payer: Managed Care, Other (non HMO) | Admitting: Podiatry

## 2018-03-10 ENCOUNTER — Ambulatory Visit (INDEPENDENT_AMBULATORY_CARE_PROVIDER_SITE_OTHER): Payer: Managed Care, Other (non HMO) | Admitting: Family Medicine

## 2018-03-10 ENCOUNTER — Encounter: Payer: Self-pay | Admitting: Family Medicine

## 2018-03-10 VITALS — BP 132/78 | HR 71 | Temp 98.0°F | Ht 67.0 in | Wt 259.2 lb

## 2018-03-10 DIAGNOSIS — M5417 Radiculopathy, lumbosacral region: Secondary | ICD-10-CM

## 2018-03-10 DIAGNOSIS — Z0000 Encounter for general adult medical examination without abnormal findings: Secondary | ICD-10-CM | POA: Diagnosis not present

## 2018-03-10 DIAGNOSIS — T783XXD Angioneurotic edema, subsequent encounter: Secondary | ICD-10-CM

## 2018-03-10 DIAGNOSIS — I6523 Occlusion and stenosis of bilateral carotid arteries: Secondary | ICD-10-CM

## 2018-03-10 DIAGNOSIS — I10 Essential (primary) hypertension: Secondary | ICD-10-CM

## 2018-03-10 DIAGNOSIS — K219 Gastro-esophageal reflux disease without esophagitis: Secondary | ICD-10-CM

## 2018-03-10 DIAGNOSIS — E1142 Type 2 diabetes mellitus with diabetic polyneuropathy: Secondary | ICD-10-CM

## 2018-03-10 DIAGNOSIS — Z23 Encounter for immunization: Secondary | ICD-10-CM

## 2018-03-10 DIAGNOSIS — Z6841 Body Mass Index (BMI) 40.0 and over, adult: Secondary | ICD-10-CM

## 2018-03-10 NOTE — Patient Instructions (Signed)

## 2018-03-10 NOTE — Progress Notes (Signed)
Patient: Melody Brooks, Female    DOB: [birth year], 62 y.o.   MRN: [id number] Visit Date: 03/10/2018  Today's Provider: Vernie Murders, PA   Chief Complaint  Patient presents with  . Annual Exam   Subjective:    Annual physical exam Melody Brooks is a 62 y.o. female who presents today for health maintenance and complete physical. She feels well. She reports not exercising regularly, but does yard work and walks her dogs. She reports she is sleeping fairly well.  ---------------------------------------------------------------- Review of Systems  Constitutional: Negative.   HENT: Negative.   Eyes: Negative.   Respiratory: Negative.   Cardiovascular: Positive for leg swelling.  Gastrointestinal: Negative.   Endocrine: Negative.   Genitourinary: Negative.   Musculoskeletal: Negative.   Skin: Negative.   Allergic/Immunologic: Negative.   Neurological: Negative.   Psychiatric/Behavioral: Negative.     Social History      She  reports that she has never smoked. She has never used smokeless tobacco. She reports that she does not drink alcohol or use drugs.       Social History   Socioeconomic History  . Marital status: Married    Spouse name: Not on file  . Number of children: Not on file  . Years of education: Not on file  . Highest education level: Not on file  Occupational History  . Not on file  Social Needs  . Financial resource strain: Not on file  . Food insecurity:    Worry: Not on file    Inability: Not on file  . Transportation needs:    Medical: Not on file    Non-medical: Not on file  Tobacco Use  . Smoking status: Never Smoker  . Smokeless tobacco: Never Used  . Tobacco comment: tobacco use- no  Substance and Sexual Activity  . Alcohol use: No  . Drug use: No  . Sexual activity: Not on file  Lifestyle  . Physical activity:    Days per week: Not on file    Minutes per session: Not on file  . Stress: Not on file  Relationships  . Social  connections:    Talks on phone: Not on file    Gets together: Not on file    Attends religious service: Not on file    Active member of club or organization: Not on file    Attends meetings of clubs or organizations: Not on file    Relationship status: Not on file  Other Topics Concern  . Not on file  Social History Narrative   Full time. Married. Regularly exercises- walks.     Past Medical History:  Diagnosis Date  . Anemia   . Angioedema   . Arthritis   . Constipation   . Diabetes mellitus    Patient takes Janumet and Actos.  . Fatigue   . Fibromuscular dysplasia ([place])   . Generalized headaches   . GERD (gastroesophageal reflux disease)   . Hemorrhoids   . Hiatal hernia   . Hypertension   . Neuropathy   . Palpitations   . Rectal bleeding   . Rectal pain    Patient Active Problem List   Diagnosis Date Noted  . Carotid stenosis 12/16/2017  . Accumulation of fluid in tissues 07/08/2015  . Arterial fibromuscular dysplasia ([place]) 07/08/2015  . Acid reflux 07/08/2015  . Left arm pain 03/12/2015  . Obesity 03/12/2015  . Essential hypertension 03/12/2015  . Bursitis, trochanteric 12/12/2014  . Lumbosacral radiculitis 12/11/2014  .  Angio-edema 05/24/2013  . Internal hemorrhoids with complication 16/05/3709  . Hyperlipemia 12/04/2010  . PALPITATIONS 12/04/2010  . DYSPNEA 12/04/2010  . ABNORMAL EKG 12/04/2010  . Inflammation with thrombosis 04/23/2010  . Anemia, iron deficiency 03/06/2008  . B12 deficiency 03/06/2008  . Avitaminosis D 03/06/2008  . Insomnia 03/01/2008  . Idiopathic peripheral neuropathy 12/23/2003  . Diabetes mellitus with polyneuropathy ([place]) 07/17/2003  . Idiopathic scoliosis 07/30/2002   Past Surgical History:  Procedure Laterality Date  . colonoscopy  2010  . EXCISIONAL HEMORRHOIDECTOMY  03/2012  . right and left eye cataract surgery  2006, 2008   right in 2006 and left 2008  . TUBAL LIGATION  1995   Family History        Family Status    Relation Name Status  . Mother  Deceased at age 54       chf  . Father  Deceased       56; carotid and leg blockages.   . Mat Aunt  Alive  . PGM  Deceased  . Brother  Alive  . Sister  Alive  . Sister  Alive  . Brother  Alive  . Brother  Alive  . Brother  Alive  . Brother  Alive        Her family history includes Arthritis in her mother; COPD in her mother; Cancer in her father, maternal aunt, and paternal grandmother; Colon cancer in her father; Diabetes in her brother and brother; Heart failure in her mother; Hypertension in her mother; Parkinson's disease in her father.     Allergies  Allergen Reactions  . Lisinopril Swelling and Hives    Angioedema Angioedema    Current Outpatient Medications:  .  acetaminophen (TYLENOL) 325 MG tablet, Take 1 tablet by mouth as needed., Disp: , Rfl:  .  Alcohol Swabs PADS, Used to check blood sugars 3-4 times a day. Dx E11.9., Disp: 360 each, Rfl: 3 .  aspirin 81 MG tablet, Take 81 mg by mouth daily., Disp: , Rfl:  .  cholecalciferol (VITAMIN D) 1000 units tablet, TAKE 1 TABLET BY MOUTH  DAILY, Disp: 100 tablet, Rfl: 3 .  DiphenhydrAMINE HCl (BENADRYL ALLERGY PO), Take by mouth 2 (two) times daily., Disp: , Rfl:  .  EPIPEN 2-PAK 0.3 MG/0.3ML SOAJ injection, as needed., Disp: , Rfl:  .  estrogen, conjugated,-medroxyprogesterone (PREMPRO) 0.3-1.5 MG per tablet, Take 1 tablet by mouth daily., Disp: , Rfl:  .  ferrous sulfate 325 (65 FE) MG EC tablet, Take by mouth., Disp: , Rfl:  .  glipiZIDE (GLUCOTROL XL) 2.5 MG 24 hr tablet, TAKE 1 TABLET BY MOUTH  DAILY WITH BREAKFAST, Disp: 90 tablet, Rfl: 1 .  glucose blood (ONE TOUCH ULTRA TEST) test strip, CHECK FASTING BLOOD SUGAR  EVERY MORNING -TEST UP TO 4 TIMES DAILY IF HAVING  HYPOGLYCEMIA, Disp: 400 each, Rfl: 2 .  hydrochlorothiazide (MICROZIDE) 12.5 MG capsule, TAKE 1 CAPSULE BY MOUTH  DAILY, Disp: 90 capsule, Rfl: 3 .  hydroxychloroquine (PLAQUENIL) 200 MG tablet, Take by mouth., Disp: , Rfl:   .  ibuprofen (ADVIL,MOTRIN) 200 MG tablet, IBUPROFEN, 200MG  (Oral Tablet)  2 to 4 tabs three times a day as needed for 0 days  Quantity: 60.00;  Refills: 0   Ordered :28-Aug-2010  Margarita Rana MD;  Started 23-April-2010 Active Comments: Medication taken as needed. DX: 451.9, Disp: , Rfl:  .  JANUMET 50-1000 MG tablet, TAKE 1 TABLET BY MOUTH TWO  TIMES DAILY, Disp: 180 tablet, Rfl: 3 .  Lancets (ONETOUCH ULTRASOFT) lancets, CHECK FASTING BLOOD SUGAR  EVERY MORNING TEST UP TO 4  TIMES DAILY IF HAVING  HYPOGLYCEMIA, Disp: 400 each, Rfl: 3 .  levocetirizine (XYZAL) 5 MG tablet, Take 1 tablet by mouth 2 (two) times daily. , Disp: , Rfl:  .  LORazepam (ATIVAN) 2 MG tablet, Take 1 tablet (2 mg total) by mouth at bedtime as needed., Disp: 90 tablet, Rfl: 1 .  losartan (COZAAR) 50 MG tablet, TAKE 1 TABLET BY MOUTH  DAILY, Disp: 90 tablet, Rfl: 3 .  methocarbamol (ROBAXIN) 750 MG tablet, TAKE 1/2 TO 1 TABLET BY MOUTH TWICE A DAY AS NEEDED, Disp: , Rfl: 5 .  Omega-3 Fatty Acids (FISH OIL) 1200 MG CAPS, Take by mouth daily. Reported on 12/26/2015, Disp: , Rfl:  .  pioglitazone (ACTOS) 45 MG tablet, Take 1 tablet (45 mg total) by mouth at bedtime., Disp: 90 tablet, Rfl: 3 .  pregabalin (LYRICA) 100 MG capsule, Take 1 capsule (100 mg total) by mouth 3 (three) times daily., Disp: 270 capsule, Rfl: 1 .  ranitidine (ZANTAC) 150 MG capsule, Take 1 capsule (150 mg total) by mouth 2 (two) times daily., Disp: 180 capsule, Rfl: 3 .  vitamin B-12 (CYANOCOBALAMIN) 1000 MCG tablet, Take by mouth., Disp: , Rfl:  .  zolpidem (AMBIEN) 5 MG tablet, Take 1 tablet (5 mg total) by mouth at bedtime as needed., Disp: 90 tablet, Rfl: 1   Patient Care Team: Chrismon, Vickki Muff, PA as PCP - General (Family Medicine)      Objective:   Vitals: BP 132/78 (BP Location: Right Arm, Patient Position: Sitting, Cuff Size: Normal)   Pulse 71   Temp 98 F (36.7 C) (Oral)   Ht 5\' 7"  (1.702 m)   Wt 259 lb 3.2 oz (117.6 kg)   LMP  03/26/2012 (Approximate)   SpO2 98%   BMI 40.60 kg/m    Physical Exam  Constitutional: She is oriented to person, place, and time. She appears well-developed and well-nourished.  HENT:  Head: Normocephalic and atraumatic.  Right Ear: External ear normal.  Left Ear: External ear normal.  Nose: Nose normal.  Mouth/Throat: Oropharynx is clear and moist.  Eyes: Pupils are equal, round, and reactive to light. Conjunctivae and EOM are normal. Right eye exhibits no discharge.  Neck: Normal range of motion. Neck supple. No tracheal deviation present. No thyromegaly present.  Cardiovascular: Normal rate, regular rhythm, normal heart sounds and intact distal pulses.  No murmur heard. Pulmonary/Chest: Effort normal and breath sounds normal. No respiratory distress. She has no wheezes. She has no rales. She exhibits no tenderness.  Abdominal: Soft. She exhibits no distension and no mass. There is no tenderness. There is no rebound and no guarding.  Genitourinary:  Genitourinary Comments: Pelvic and breast exam deferred to GYN to accomplish in the Fall.  Musculoskeletal: Normal range of motion. She exhibits no edema or tenderness.  Stiffness in hips and back with crepitus in both knees.   Lymphadenopathy:    She has no cervical adenopathy.  Neurological: She is alert and oriented to person, place, and time. She has normal reflexes. She displays normal reflexes. No cranial nerve deficit. She exhibits normal muscle tone. Coordination normal.  Slight imbalance to test Romberg - secondary to DM peripheral neuropathy of feet.  Skin: Skin is warm and dry. No rash noted. No erythema.  Psychiatric: She has a normal mood and affect. Her behavior is normal. Judgment and thought content normal.    Depression Screen PHQ  2/9 Scores 03/10/2018 06/03/2017 12/26/2015  PHQ - 2 Score 0 0 0  PHQ- 9 Score 6 - 5     Assessment & Plan:     Routine Health Maintenance and Physical Exam  Exercise Activities and  Dietary recommendations Goals    Encouraged to walk for exercise 30 minutes 3 times a week as back pain and arthritis allows.      Immunization History  Administered Date(s) Administered  . Influenza-Unspecified 08/01/2017  . Pneumococcal Polysaccharide-23 09/01/2011  . Tdap 08/28/2010  . Zoster Recombinat (Shingrix) 12/02/2017    Health Maintenance  Topic Date Due  . PNEUMOCOCCAL POLYSACCHARIDE VACCINE (2) 08/31/2016  . INFLUENZA VACCINE  06/01/2018  . HEMOGLOBIN A1C  06/01/2018  . FOOT EXAM  06/03/2018  . MAMMOGRAM  06/17/2018  . OPHTHALMOLOGY EXAM  07/29/2018  . PAP SMEAR  06/17/2020  . TETANUS/TDAP  08/28/2020  . COLONOSCOPY  01/28/2028  . Hepatitis C Screening  Completed  . HIV Screening  Completed    Discussed health benefits of physical activity, and encouraged her to engage in regular exercise appropriate for her age and condition.    -------------------------------------------------------------------- 1. Annual physical exam Stable general health with obesity. Encouraged to lose weight with reduction in caloric intake and regular exercise. Last colonoscopy 01-27-18. Will schedule mammograms, PAP smear and pelvic exam with GYN in the Fall 2019. Given anticipatory guidance and recheck routine labs.  2. Need for shingles vaccine - Varicella-zoster vaccine IM (Shingrix)  3. Bilateral carotid artery stenosis No bruit heard today. Followed by Dr. Lucky Cowboy (vascular) 12-16-17. Check routine labs. - CBC with Differential/Platelet - Comprehensive metabolic panel - Lipid panel  4. Essential hypertension Well controlled BP.  Tolerating HCTX 12.5 mg qd with Losartan 50 mg qd. Denies chest pains, palpitations, dyspnea or edema. Recheck routine labs and follow up pending reports. - CBC with Differential/Platelet - Comprehensive metabolic panel - Lipid panel - TSH  5. Gastroesophageal reflux disease, esophagitis presence not specified Dyspepsia returns if she does not take the  Ranitidine 150 mg BID. No melena, hematochezia, constipation, diarrhea or hematemesis. Recheck CBC. If symptoms persistent, will need GI evaluation and possible upper endoscopy. - CBC with Differential/Platelet  6. Diabetic polyneuropathy associated with type 2 diabetes mellitus ([facility]) Last Hgb A1C was higher at 6.8%. Still taking Glipizide 2.5 mg qd, Janumet 50-1000 mg BID and Actos 45 mg hs. Continues Lyrica 100 mg TID for neuropathy. Due for detailed foot exam by podiatrist and ophthalmology exam in the Fall of 2019. Recheck labs and follow up pending reports. - CBC with Differential/Platelet - Comprehensive metabolic panel - Hemoglobin A1c - Lipid panel  7. Lumbosacral radiculitis Intermittent discomfort and occasionally some sciatica. Uses Ibuprofen prn or Tylenol. Followed by Dr. Sharlet Salina (physiatrist).  8. Angioedema, subsequent encounter No recent recurrence. Initial episode felt to be a reaction to and ACE inhibitor. Followed by Dr. Manuella Ghazi (pulmonologist at [facility]) 08-04-17. Continues Plaquenil 200 mg qd and keeps an Epi-Pen available. Recheck CBC, CMP and TSH. - CBC with Differential/Platelet - Comprehensive metabolic panel - TSH  9. Morbid obesity with BMI of 40.0-44.9, adult ([place]) Encouraged to lower caloric intake and increase exercise level. Check routine labs for metabolic dysfunction. - CBC with Differential/Platelet - Comprehensive metabolic panel - Hemoglobin A1c - Lipid panel - TSH    Vernie Murders, PA  [facility]

## 2018-03-17 ENCOUNTER — Ambulatory Visit: Payer: Managed Care, Other (non HMO) | Admitting: Podiatry

## 2018-03-17 ENCOUNTER — Ambulatory Visit (INDEPENDENT_AMBULATORY_CARE_PROVIDER_SITE_OTHER): Payer: Managed Care, Other (non HMO)

## 2018-03-17 ENCOUNTER — Encounter: Payer: Self-pay | Admitting: Podiatry

## 2018-03-17 VITALS — BP 136/72 | HR 78

## 2018-03-17 DIAGNOSIS — L600 Ingrowing nail: Secondary | ICD-10-CM

## 2018-03-17 DIAGNOSIS — Q828 Other specified congenital malformations of skin: Secondary | ICD-10-CM

## 2018-03-17 DIAGNOSIS — M79671 Pain in right foot: Secondary | ICD-10-CM

## 2018-03-20 NOTE — Progress Notes (Signed)
   Subjective: Patient presents today for evaluation of pain to the medial and lateral borders of bilateral great toes that began 1-2 months ago. She reports associated redness and swelling of the areas. Patient is concerned for possible ingrown nail. She also reports sharp pain to bilateral feet she states is due to neuropathy. She also reports a callus lesion to the plantar aspect of the right heel that has been present for the past several months. She has not done anything to treat her symptoms. Walking for long periods of time and wearing certain shoes increases the pain. Patient presents today for further treatment and evaluation.  Past Medical History:  Diagnosis Date  . Anemia   . Angioedema   . Arthritis   . Constipation   . Diabetes mellitus    Patient takes Janumet and Actos.  . Fatigue   . Fibromuscular dysplasia ([place])   . Generalized headaches   . GERD (gastroesophageal reflux disease)   . Hemorrhoids   . Hiatal hernia   . Hypertension   . Neuropathy   . Palpitations   . Rectal bleeding   . Rectal pain     Objective:  General: Well developed, nourished, in no acute distress, alert and oriented x3   Dermatology: Skin is warm, dry and supple bilateral. Medial and lateral borders of bilateral great toes appears to be erythematous with evidence of an ingrowing nail. Pain on palpation noted to the border of the nail fold. The remaining nails appear unremarkable at this time. There are no open sores, lesions. Hyperkeratotic lesion present on the right foot. Pain on palpation with a central nucleated core noted.  Vascular: Dorsalis Pedis artery and Posterior Tibial artery pedal pulses palpable. No lower extremity edema noted.   Neruologic: Grossly intact via light touch bilateral.  Musculoskeletal: Muscular strength within normal limits in all groups bilateral. Normal range of motion noted to all pedal and ankle joints.   Assesement: #1 Paronychia with ingrowing nail medial  and lateral borders bilateral great toes #2 Pain in toe #3 Incurvated nail #4 porokeratosis right foot   Plan of Care:  1. Patient evaluated.  2. Discussed treatment alternatives and plan of care. Explained nail avulsion procedure and post procedure course to patient. 3. Patient opted for conservative treatment at this time.  4. Mechanical debridement of bilateral great toenails performed using a nail nipper. Filed with dremel without incident.  5. Excisional debridement of keratoic lesion using a chisel blade was performed without incident.  6. Treated area(s) with Salinocaine and dressed with light dressing. 7. Return to clinic as needed.     Edrick Kins, DPM [facility]  Dr. Edrick Kins, [address]                                        [place], [place] [address]                Office [phone]  Fax [phone]

## 2018-03-24 ENCOUNTER — Other Ambulatory Visit: Payer: Self-pay | Admitting: Family Medicine

## 2018-03-24 DIAGNOSIS — E1142 Type 2 diabetes mellitus with diabetic polyneuropathy: Secondary | ICD-10-CM

## 2018-03-24 DIAGNOSIS — G4700 Insomnia, unspecified: Secondary | ICD-10-CM

## 2018-03-28 ENCOUNTER — Other Ambulatory Visit: Payer: Self-pay | Admitting: Family Medicine

## 2018-03-28 DIAGNOSIS — G4700 Insomnia, unspecified: Secondary | ICD-10-CM

## 2018-03-28 MED ORDER — ZOLPIDEM TARTRATE 5 MG PO TABS: 5.0000 mg | ORAL_TABLET | Freq: Every evening | ORAL | 1 refills | Status: DC | PRN

## 2018-03-28 NOTE — Telephone Encounter (Signed)
[facility] faxed refill request for the following medications:  zolpidem (AMBIEN) 5 MG tablet   90 day supply  Last Rx: 08/30/17 with 1 refill LOV: 03/10/18 Please advise. Thanks TNP

## 2018-03-28 NOTE — Telephone Encounter (Signed)
Up to you

## 2018-06-09 ENCOUNTER — Ambulatory Visit: Payer: Managed Care, Other (non HMO) | Admitting: Family Medicine

## 2018-06-09 ENCOUNTER — Encounter: Payer: Self-pay | Admitting: Family Medicine

## 2018-06-09 VITALS — BP 140/70 | HR 92 | Temp 98.5°F | Resp 16 | Wt 266.2 lb

## 2018-06-09 DIAGNOSIS — N3091 Cystitis, unspecified with hematuria: Secondary | ICD-10-CM

## 2018-06-09 LAB — POCT URINALYSIS DIPSTICK
BILIRUBIN UA: NEGATIVE
GLUCOSE UA: NEGATIVE
KETONES UA: NEGATIVE
Nitrite, UA: NEGATIVE
Protein, UA: POSITIVE — AB
Spec Grav, UA: >=1.03 — AB (ref 1.010–1.025)
Urobilinogen, UA: 0.2 E.U./dL
pH, UA: 6 (ref 5.0–8.0)

## 2018-06-09 MED ORDER — CEPHALEXIN 500 MG PO CAPS: 500.0000 mg | ORAL_CAPSULE | Freq: Two times a day (BID) | ORAL | 0 refills | Status: DC

## 2018-06-09 NOTE — Patient Instructions (Signed)
We will call you with the urine culture results. 

## 2018-06-09 NOTE — Progress Notes (Signed)
  Subjective:     Patient ID: Melody Brooks, female   DOB: December 09, 1955, 62 y.o.   MRN: [id number] Chief Complaint  Patient presents with  . Urinary Tract Infection    Patient comes in office today with concerns of a urinary infection. Patient states for the past 24hrs she has had dysuria, frequency, urgency and hematuria.    HPI Reports it has been about 2 years since she had a prior UTI. Reports chills but no fever.  Review of Systems     Objective:   Physical Exam  Constitutional: She appears well-developed and well-nourished. No distress.  Genitourinary:  Genitourinary Comments: No cva tenderness       Assessment:    1. Cystitis with hematuria - POCT urinalysis dipstick - Urine Culture - cephALEXin (KEFLEX) 500 MG capsule; Take 1 capsule (500 mg total) by mouth 2 (two) times daily.  Dispense: 14 capsule; Refill: 0    Plan:    Further f/u pending urine culture results.

## 2018-06-11 LAB — URINE CULTURE

## 2018-06-12 ENCOUNTER — Telehealth: Payer: Self-pay

## 2018-06-12 NOTE — Telephone Encounter (Signed)
[id number] 

## 2018-06-12 NOTE — Telephone Encounter (Signed)
-----   Message from Carmon Ginsberg, [place] sent at 06/12/2018  7:27 AM EDT ----- No significant growth on urine culture. Is she improving with the antibiotic?

## 2018-06-15 NOTE — Telephone Encounter (Signed)
[id number] 

## 2018-06-15 NOTE — Telephone Encounter (Signed)
Patient was advised. KW 

## 2018-06-29 ENCOUNTER — Ambulatory Visit: Payer: Managed Care, Other (non HMO) | Admitting: Family Medicine

## 2018-06-30 ENCOUNTER — Other Ambulatory Visit: Payer: Self-pay | Admitting: Family Medicine

## 2018-06-30 DIAGNOSIS — E1142 Type 2 diabetes mellitus with diabetic polyneuropathy: Secondary | ICD-10-CM

## 2018-07-04 ENCOUNTER — Other Ambulatory Visit: Payer: Self-pay | Admitting: Family Medicine

## 2018-07-04 DIAGNOSIS — E1142 Type 2 diabetes mellitus with diabetic polyneuropathy: Secondary | ICD-10-CM

## 2018-07-07 ENCOUNTER — Ambulatory Visit: Payer: Self-pay | Admitting: Physician Assistant

## 2018-07-14 ENCOUNTER — Ambulatory Visit: Payer: Managed Care, Other (non HMO) | Admitting: Family Medicine

## 2018-07-14 ENCOUNTER — Encounter: Payer: Self-pay | Admitting: Family Medicine

## 2018-07-14 VITALS — BP 128/70 | HR 75 | Temp 97.7°F | Wt 267.0 lb

## 2018-07-14 DIAGNOSIS — Z23 Encounter for immunization: Secondary | ICD-10-CM | POA: Diagnosis not present

## 2018-07-14 DIAGNOSIS — E7800 Pure hypercholesterolemia, unspecified: Secondary | ICD-10-CM

## 2018-07-14 DIAGNOSIS — I6523 Occlusion and stenosis of bilateral carotid arteries: Secondary | ICD-10-CM

## 2018-07-14 DIAGNOSIS — M7061 Trochanteric bursitis, right hip: Secondary | ICD-10-CM

## 2018-07-14 DIAGNOSIS — Z6841 Body Mass Index (BMI) 40.0 and over, adult: Secondary | ICD-10-CM

## 2018-07-14 DIAGNOSIS — E1142 Type 2 diabetes mellitus with diabetic polyneuropathy: Secondary | ICD-10-CM

## 2018-07-14 DIAGNOSIS — I10 Essential (primary) hypertension: Secondary | ICD-10-CM

## 2018-07-14 NOTE — Progress Notes (Signed)
Patient: Melody Brooks Female    DOB: Aug 28, 1956   62 y.o.   MRN: [id number] Visit Date: 07/14/2018  Today's Provider: Vernie Murders, PA   Chief Complaint  Patient presents with  . Diabetes  . Hypertension  . Hyperlipidemia   Subjective:    Diabetes  She presents for her follow-up diabetic visit. She has type 2 diabetes mellitus. Her disease course has been stable. Pertinent negatives for hypoglycemia include no headaches or sweats. There are no diabetic associated symptoms. Pertinent negatives for diabetes include no blurred vision and no chest pain. Her weight is stable. She participates in exercise daily. An ACE inhibitor/angiotensin II receptor blocker is being taken. She does not see a podiatrist.Eye exam is current.  Hyperlipidemia  This is a chronic problem. The problem is controlled. Recent lipid tests were reviewed and are normal. Pertinent negatives include no chest pain or shortness of breath. Current antihyperlipidemic treatment includes statins. There are no compliance problems.   Hypertension  This is a chronic problem. The problem is controlled. Associated symptoms include peripheral edema. Pertinent negatives include no anxiety, blurred vision, chest pain, headaches, malaise/fatigue, neck pain, orthopnea, palpitations, PND, shortness of breath or sweats.   BP Readings from Last 3 Encounters:  07/14/18 128/70  06/09/18 140/70  03/17/18 136/72   Lab Results  Component Value Date   CHOL 134 12/02/2017   CHOL 147 12/24/2016   CHOL 165 06/18/2016   Lab Results  Component Value Date   HDL 52 12/02/2017   HDL 51 12/24/2016   HDL 52 06/18/2016   Lab Results  Component Value Date   LDLCALC 60 12/02/2017   LDLCALC 71 12/24/2016   LDLCALC 84 06/18/2016   Lab Results  Component Value Date   TRIG 110 12/02/2017   TRIG 126 12/24/2016   TRIG 143 06/18/2016   Lab Results  Component Value Date   CHOLHDL 2.6 12/02/2017   CHOLHDL 2.9 12/24/2016   CHOLHDL  3.2 06/18/2016   No results found for: LDLDIRECT Wt Readings from Last 3 Encounters:  07/14/18 267 lb (121.1 kg)  06/09/18 266 lb 3.2 oz (120.7 kg)  03/10/18 259 lb 3.2 oz (117.6 kg)   Lab Results  Component Value Date   HGBA1C 6.8 (H) 12/02/2017      Past Surgical History:  Procedure Laterality Date  . colonoscopy  2010  . EXCISIONAL HEMORRHOIDECTOMY  03/2012  . right and left eye cataract surgery  2006, 2008   right in 2006 and left 2008  . TUBAL LIGATION  1995   Family History  Problem Relation Age of Onset  . Heart failure Mother   . COPD Mother   . Hypertension Mother   . Arthritis Mother   . Cancer Father        prostate  . Parkinson's disease Father   . Colon cancer Father   . Cancer Maternal Aunt        colon  . Cancer Paternal Grandmother        stomach  . Diabetes Brother   . Diabetes Brother    Allergies  Allergen Reactions  . Lisinopril Swelling and Hives    Angioedema Angioedema    Current Outpatient Medications:  .  acetaminophen (TYLENOL) 325 MG tablet, Take 1 tablet by mouth as needed., Disp: , Rfl:  .  Alcohol Swabs PADS, Used to check blood sugars 3-4 times a day. Dx E11.9., Disp: 360 each, Rfl: 3 .  aspirin 81 MG tablet, Take  81 mg by mouth daily., Disp: , Rfl:  .  cholecalciferol (VITAMIN D) 1000 units tablet, TAKE 1 TABLET BY MOUTH  DAILY, Disp: 100 tablet, Rfl: 3 .  DiphenhydrAMINE HCl (BENADRYL ALLERGY PO), Take by mouth 2 (two) times daily., Disp: , Rfl:  .  EPIPEN 2-PAK 0.3 MG/0.3ML SOAJ injection, as needed., Disp: , Rfl:  .  estrogen, conjugated,-medroxyprogesterone (PREMPRO) 0.3-1.5 MG per tablet, Take 1 tablet by mouth daily., Disp: , Rfl:  .  ferrous sulfate 325 (65 FE) MG EC tablet, Take by mouth., Disp: , Rfl:  .  glipiZIDE (GLUCOTROL XL) 2.5 MG 24 hr tablet, TAKE 1 TABLET BY MOUTH  DAILY WITH BREAKFAST, Disp: 90 tablet, Rfl: 1 .  glucose blood (ONE TOUCH ULTRA TEST) test strip, CHECK FASTING BLOOD SUGAR  EVERY MORNING -TEST UP TO  4 TIMES DAILY IF HAVING  HYPOGLYCEMIA, Disp: 400 each, Rfl: 2 .  hydrochlorothiazide (MICROZIDE) 12.5 MG capsule, TAKE 1 CAPSULE BY MOUTH  DAILY, Disp: 90 capsule, Rfl: 3 .  hydroxychloroquine (PLAQUENIL) 200 MG tablet, Take by mouth., Disp: , Rfl:  .  ibuprofen (ADVIL,MOTRIN) 200 MG tablet, IBUPROFEN, 200MG  (Oral Tablet)  2 to 4 tabs three times a day as needed for 0 days  Quantity: 60.00;  Refills: 0   Ordered :28-Aug-2010  Margarita Rana MD;  Started 23-April-2010 Active Comments: Medication taken as needed. DX: 451.9, Disp: , Rfl:  .  JANUMET 50-1000 MG tablet, TAKE 1 TABLET BY MOUTH TWO  TIMES DAILY, Disp: 180 tablet, Rfl: 3 .  Lancets (ONETOUCH ULTRASOFT) lancets, CHECK FASTING BLOOD SUGAR  EVERY MORNING TEST UP TO 4  TIMES DAILY IF HAVING  HYPOGLYCEMIA, Disp: 400 each, Rfl: 3 .  levocetirizine (XYZAL) 5 MG tablet, Take 1 tablet by mouth 2 (two) times daily. , Disp: , Rfl:  .  LORazepam (ATIVAN) 2 MG tablet, TAKE 1 TABLET BY MOUTH AT  BEDTIME AS NEEDED, Disp: 90 tablet, Rfl: 0 .  losartan (COZAAR) 50 MG tablet, TAKE 1 TABLET BY MOUTH  DAILY, Disp: 90 tablet, Rfl: 3 .  LYRICA 100 MG capsule, TAKE 1 CAPSULE BY MOUTH 3  TIMES DAILY, Disp: 270 capsule, Rfl: 1 .  methocarbamol (ROBAXIN) 750 MG tablet, TAKE 1/2 TO 1 TABLET BY MOUTH TWICE A DAY AS NEEDED, Disp: , Rfl: 5 .  pioglitazone (ACTOS) 45 MG tablet, TAKE 1 TABLET BY MOUTH AT  BEDTIME, Disp: 90 tablet, Rfl: 3 .  ranitidine (ZANTAC) 150 MG capsule, TAKE 1 CAPSULE BY MOUTH TWO TIMES DAILY, Disp: 180 capsule, Rfl: 3 .  vitamin B-12 (CYANOCOBALAMIN) 1000 MCG tablet, Take by mouth., Disp: , Rfl:  .  zolpidem (AMBIEN) 5 MG tablet, Take 1 tablet (5 mg total) by mouth at bedtime as needed., Disp: 90 tablet, Rfl: 1  Review of Systems  Constitutional: Negative for malaise/fatigue.  Eyes: Negative for blurred vision.  Respiratory: Negative for shortness of breath.   Cardiovascular: Negative for chest pain, palpitations, orthopnea and PND.  Endocrine:  Negative.   Musculoskeletal: Negative for neck pain.  Neurological: Negative for headaches.   Social History   Tobacco Use  . Smoking status: Never Smoker  . Smokeless tobacco: Never Used  . Tobacco comment: tobacco use- no  Substance Use Topics  . Alcohol use: No   Objective:   BP 128/70 (BP Location: Right Arm, Patient Position: Sitting, Cuff Size: Large)   Pulse 75   Temp 97.7 F (36.5 C) (Oral)   Wt 267 lb (121.1 kg)   LMP 03/26/2012 (Approximate)  SpO2 97%   BMI 41.82 kg/m  Vitals:   07/14/18 0815  BP: 128/70  Pulse: 75  Temp: 97.7 F (36.5 C)  TempSrc: Oral  SpO2: 97%  Weight: 267 lb (121.1 kg)   Physical Exam  Constitutional: She is oriented to person, place, and time. She appears well-developed and well-nourished. No distress.  HENT:  Head: Normocephalic and atraumatic.  Right Ear: Hearing normal.  Left Ear: Hearing normal.  Nose: Nose normal.  Eyes: Conjunctivae and lids are normal. Right eye exhibits no discharge. Left eye exhibits no discharge. No scleral icterus.  Cardiovascular: Normal rate and regular rhythm.  Pulmonary/Chest: Effort normal. No respiratory distress.  Musculoskeletal: Normal range of motion.  Crepitus both knees (L>R) with intermittent pain and stiffness. Low back stiffness from spondylosis and trochanteric bursitis right hip.  Neurological: She is alert and oriented to person, place, and time. A sensory deficit is present.  Decreased sensation plantar surface of both feet with intermittent sharp pains.  Skin: Skin is intact. No lesion and no rash noted.  Psychiatric: She has a normal mood and affect. Her speech is normal and behavior is normal. Thought content normal.   Diabetic Foot Form - Detailed   Diabetic Foot Exam - detailed Diabetic Foot exam was performed with the following findings:  Yes 07/14/2018  9:03 AM  Visual Foot Exam completed.:  Yes  Can the patient see the bottom of their feet?:  Yes Are the shoes appropriate in  style and fit?:  Yes Is there swelling or and abnormal foot shape?:  No Is there a claw toe deformity?:  No Is there elevated skin temparature?:  No Is there foot or ankle muscle weakness?:  No Normal Range of Motion:  Yes Pulse Foot Exam completed.:  Yes  Right posterior Tibialias:  Present Left posterior Tibialias:  Present   Left Dorsalis Pedis:  Present  Sensory Foot Exam Completed.:  Yes Semmes-Weinstein Monofilament Test R Site 1-Great Toe:  Pos L Site 1-Great Toe:  Pos    Comments:  Neuropathy around each heel and worse in the left foot.        Assessment & Plan:     1. Essential hypertension Stable and well controlled on the Losartan 50 mg qd. No chest pains, dyspnea or palpitations. No peripheral edema. Had labs done at work recently and will bring copy for review. Continue to restrict sodium/salt intake.   2. Pure hypercholesterolemia Trying to follow a low fat diet. Weight up another pound in the past month. Recommend calorie reduction and increase in exercise to 30 minutes 3-5 times a week. Follow up pending review of lab reports.  3. Bilateral carotid artery stenosis No dizziness or syncope. Has follow up appointment with Dr. Lucky Cowboy (vascular surgeon) scheduled for 12-21-19.  4. Diabetic polyneuropathy associated with type 2 diabetes mellitus ([place]) Unchanged decreased sensation and prickly pains in both feet. Has had evaluation by Dr.Evans (podiatrist) in May 2019. To schedule ophthalmology exam the end of September 2019. Tolerating glipizide 2.5 mg q am, Janumet 50-1000 mg BID and Actos 45 mg hs. Continue diabetic diet and encouraged to exercise regularly. Will bring lab reports for review.  5. Trochanteric bursitis of right hip Followed and treated with injections by Dr. Sharlet Salina (pain management).  6. Morbid obesity with BMI of 40.0-44.9, adult ([place]) Needs to reduce caloric intake and exercise 30 minutes 4-5 days a week. Follow up pending review of lab reports.       Vernie Murders, [place]  [facility]

## 2018-07-25 NOTE — Addendum Note (Signed)
Addended by: Ashley Royalty E on: 07/25/2018 04:42 PM   Modules accepted: Orders

## 2018-08-14 DIAGNOSIS — H02839 Dermatochalasis of unspecified eye, unspecified eyelid: Secondary | ICD-10-CM | POA: Insufficient documentation

## 2018-08-14 DIAGNOSIS — H5340 Unspecified visual field defects: Secondary | ICD-10-CM | POA: Insufficient documentation

## 2018-08-14 DIAGNOSIS — H02403 Unspecified ptosis of bilateral eyelids: Secondary | ICD-10-CM | POA: Insufficient documentation

## 2018-08-23 ENCOUNTER — Telehealth: Payer: Self-pay | Admitting: Family Medicine

## 2018-08-23 NOTE — Telephone Encounter (Signed)
Pt wants to know if you will double her dose of the  Hydrochlorothiazide 12.5.  She has been having a lot of swelling in her left leg.  She has been taking it twice a day on her on and it has helped a lot.  She said her Blood pressure has been good.  She would like you to increase it and send a prescription to [facility]  Pt's CB#  [phone]  Thanks [employer]

## 2018-08-24 NOTE — Telephone Encounter (Signed)
Left detailed message for pt to call back and schedule an appt for fup on the increace of HCTZ per Simona Huh.  dbs,cma

## 2018-08-24 NOTE — Telephone Encounter (Signed)
Schedule appointment to document BP, kidney function and electrolytes with the increased use of Hydrochlorothiazide.

## 2018-08-25 ENCOUNTER — Telehealth: Payer: Self-pay | Admitting: Family Medicine

## 2018-08-25 ENCOUNTER — Other Ambulatory Visit: Payer: Self-pay | Admitting: Family Medicine

## 2018-08-25 DIAGNOSIS — E7800 Pure hypercholesterolemia, unspecified: Secondary | ICD-10-CM

## 2018-08-25 DIAGNOSIS — E1142 Type 2 diabetes mellitus with diabetic polyneuropathy: Secondary | ICD-10-CM

## 2018-08-25 DIAGNOSIS — I10 Essential (primary) hypertension: Secondary | ICD-10-CM

## 2018-08-25 NOTE — Telephone Encounter (Signed)
Patient would like for the ordered labs to be printed off today so she could have her labs drawn before the appointment.

## 2018-08-25 NOTE — Telephone Encounter (Signed)
Pt needing to have blood work done per [employer] before her fluid pill can be increased. Pt wanting to come by and do blood work done before her appt w/ Simona Huh. Please let pt know when the orders are ready for pickup.  Thanks, [employer]

## 2018-08-25 NOTE — Telephone Encounter (Signed)
Order placed in chart and only needs to be released to print out the day she comes to get blood drawn. Remind her she will need an appointment to go over the reports and examine areas of edema/swelling.

## 2018-08-28 ENCOUNTER — Telehealth: Payer: Self-pay | Admitting: Family Medicine

## 2018-08-28 NOTE — Telephone Encounter (Signed)
Spoke with patient.,PC 

## 2018-08-28 NOTE — Telephone Encounter (Signed)
Patient was advised.,PC 

## 2018-08-28 NOTE — Telephone Encounter (Signed)
Pt returned Porsha's call. Please call pt back.  Thanks, [employer]

## 2018-08-28 NOTE — Telephone Encounter (Signed)
LVMTRC.,PC 

## 2018-09-07 ENCOUNTER — Other Ambulatory Visit: Payer: Self-pay | Admitting: Family Medicine

## 2018-09-07 DIAGNOSIS — E1142 Type 2 diabetes mellitus with diabetic polyneuropathy: Secondary | ICD-10-CM

## 2018-09-08 NOTE — Telephone Encounter (Signed)
Left vm with lab and med refill information and to call back if any questions.  dbs

## 2018-09-08 NOTE — Telephone Encounter (Signed)
Remind patient she is past due to get labs done to follow up on diabetes. Need that done to continue refilling medications. Not due for office visit for 2 months. Sent this one refill to [facility].

## 2018-09-12 LAB — COMPREHENSIVE METABOLIC PANEL
ALK PHOS: 68 IU/L (ref 39–117)
ALT: 18 IU/L (ref 0–32)
AST: 14 IU/L (ref 0–40)
Albumin/Globulin Ratio: 2.1 (ref 1.2–2.2)
Albumin: 4.1 g/dL (ref 3.6–4.8)
BUN/Creatinine Ratio: 18 (ref 12–28)
BUN: 18 mg/dL (ref 8–27)
Bilirubin Total: 0.5 mg/dL (ref 0.0–1.2)
CHLORIDE: 105 mmol/L (ref 96–106)
CO2: 24 mmol/L (ref 20–29)
CREATININE: 1.01 mg/dL — AB (ref 0.57–1.00)
Calcium: 9.5 mg/dL (ref 8.7–10.3)
GFR calc Af Amer: 69 mL/min/{1.73_m2} (ref >59)
GFR calc non Af Amer: 60 mL/min/{1.73_m2} (ref >59)
GLUCOSE: 97 mg/dL (ref 65–99)
Globulin, Total: 2 g/dL (ref 1.5–4.5)
Potassium: 4.3 mmol/L (ref 3.5–5.2)
Sodium: 144 mmol/L (ref 134–144)
Total Protein: 6.1 g/dL (ref 6.0–8.5)

## 2018-09-12 LAB — CBC WITH DIFFERENTIAL/PLATELET
BASOS ABS: 0 10*3/uL (ref 0.0–0.2)
Basos: 1 %
EOS (ABSOLUTE): 0.2 10*3/uL (ref 0.0–0.4)
Eos: 3 %
Hematocrit: 35.8 % (ref 34.0–46.6)
Hemoglobin: 11.9 g/dL (ref 11.1–15.9)
IMMATURE GRANS (ABS): 0 10*3/uL (ref 0.0–0.1)
Immature Granulocytes: 0 %
LYMPHS: 27 %
Lymphocytes Absolute: 1.7 10*3/uL (ref 0.7–3.1)
MCH: 29.8 pg (ref 26.6–33.0)
MCHC: 33.2 g/dL (ref 31.5–35.7)
MCV: 90 fL (ref 79–97)
Monocytes Absolute: 0.5 10*3/uL (ref 0.1–0.9)
Monocytes: 7 %
Neutrophils Absolute: 3.9 10*3/uL (ref 1.4–7.0)
Neutrophils: 62 %
Platelets: 242 10*3/uL (ref 150–450)
RBC: 4 x10E6/uL (ref 3.77–5.28)
RDW: 13.2 % (ref 12.3–15.4)
WBC: 6.2 10*3/uL (ref 3.4–10.8)

## 2018-09-12 LAB — LIPID PANEL
Chol/HDL Ratio: 2.5 ratio (ref 0.0–4.4)
Cholesterol, Total: 145 mg/dL (ref 100–199)
HDL: 57 mg/dL (ref >39)
LDL Calculated: 64 mg/dL (ref 0–99)
TRIGLYCERIDES: 120 mg/dL (ref 0–149)
VLDL Cholesterol Cal: 24 mg/dL (ref 5–40)

## 2018-09-12 LAB — HEMOGLOBIN A1C
ESTIMATED AVERAGE GLUCOSE: 120 mg/dL
HEMOGLOBIN A1C: 5.8 % — AB (ref 4.8–5.6)

## 2018-09-12 LAB — TSH: TSH: 2.84 u[IU]/mL (ref 0.450–4.500)

## 2018-09-19 ENCOUNTER — Other Ambulatory Visit: Payer: Self-pay | Admitting: Family Medicine

## 2018-09-19 DIAGNOSIS — G4700 Insomnia, unspecified: Secondary | ICD-10-CM

## 2018-09-19 MED ORDER — LORAZEPAM 2 MG PO TABS: 2.0000 mg | ORAL_TABLET | Freq: Every evening | ORAL | 0 refills | Status: DC | PRN

## 2018-09-19 NOTE — Telephone Encounter (Signed)
[facility] faxed refill request for the following medications:  LORazepam (ATIVAN) 2 MG tablet  Please advise.

## 2018-09-24 ENCOUNTER — Other Ambulatory Visit: Payer: Self-pay | Admitting: Family Medicine

## 2018-09-24 DIAGNOSIS — E1142 Type 2 diabetes mellitus with diabetic polyneuropathy: Secondary | ICD-10-CM

## 2018-10-02 ENCOUNTER — Other Ambulatory Visit: Payer: Self-pay | Admitting: Family Medicine

## 2018-10-27 ENCOUNTER — Other Ambulatory Visit: Payer: Self-pay | Admitting: Family Medicine

## 2018-10-27 DIAGNOSIS — I10 Essential (primary) hypertension: Secondary | ICD-10-CM

## 2018-10-27 DIAGNOSIS — E1142 Type 2 diabetes mellitus with diabetic polyneuropathy: Secondary | ICD-10-CM

## 2018-10-27 DIAGNOSIS — G4700 Insomnia, unspecified: Secondary | ICD-10-CM

## 2018-11-13 ENCOUNTER — Other Ambulatory Visit: Payer: Self-pay | Admitting: Family Medicine

## 2018-11-13 DIAGNOSIS — G4700 Insomnia, unspecified: Secondary | ICD-10-CM

## 2018-11-16 ENCOUNTER — Other Ambulatory Visit: Payer: Self-pay | Admitting: Family Medicine

## 2018-11-16 DIAGNOSIS — G4700 Insomnia, unspecified: Secondary | ICD-10-CM

## 2018-12-20 ENCOUNTER — Other Ambulatory Visit: Payer: Self-pay | Admitting: Family Medicine

## 2018-12-20 DIAGNOSIS — G4700 Insomnia, unspecified: Secondary | ICD-10-CM

## 2018-12-20 DIAGNOSIS — E1142 Type 2 diabetes mellitus with diabetic polyneuropathy: Secondary | ICD-10-CM

## 2019-01-10 ENCOUNTER — Other Ambulatory Visit: Payer: Self-pay | Admitting: Family Medicine

## 2019-01-10 DIAGNOSIS — G4700 Insomnia, unspecified: Secondary | ICD-10-CM

## 2019-01-11 ENCOUNTER — Other Ambulatory Visit: Payer: Self-pay | Admitting: Family Medicine

## 2019-01-11 DIAGNOSIS — E1142 Type 2 diabetes mellitus with diabetic polyneuropathy: Secondary | ICD-10-CM

## 2019-02-07 ENCOUNTER — Other Ambulatory Visit: Payer: Self-pay | Admitting: Family Medicine

## 2019-02-07 DIAGNOSIS — G4700 Insomnia, unspecified: Secondary | ICD-10-CM

## 2019-02-19 ENCOUNTER — Other Ambulatory Visit: Payer: Self-pay | Admitting: Family Medicine

## 2019-02-19 DIAGNOSIS — E1142 Type 2 diabetes mellitus with diabetic polyneuropathy: Secondary | ICD-10-CM

## 2019-02-19 NOTE — Telephone Encounter (Signed)
[facility] faxed refill request for the following medications:  pregabalin (LYRICA) 100 MG capsule 90 day supply  Please advise. Thanks TNP

## 2019-02-20 MED ORDER — PREGABALIN 100 MG PO CAPS: 100.0000 mg | ORAL_CAPSULE | Freq: Three times a day (TID) | ORAL | 1 refills | Status: DC

## 2019-02-26 ENCOUNTER — Other Ambulatory Visit: Payer: Self-pay | Admitting: Family Medicine

## 2019-02-26 DIAGNOSIS — G4700 Insomnia, unspecified: Secondary | ICD-10-CM

## 2019-03-19 ENCOUNTER — Other Ambulatory Visit: Payer: Self-pay

## 2019-03-19 DIAGNOSIS — G4700 Insomnia, unspecified: Secondary | ICD-10-CM

## 2019-03-19 NOTE — Telephone Encounter (Signed)
Patient called requesting refills into mail order pharmacy. Please review. Thanks!

## 2019-03-23 ENCOUNTER — Other Ambulatory Visit: Payer: Self-pay | Admitting: Family Medicine

## 2019-03-23 DIAGNOSIS — E1142 Type 2 diabetes mellitus with diabetic polyneuropathy: Secondary | ICD-10-CM

## 2019-04-22 ENCOUNTER — Other Ambulatory Visit: Payer: Self-pay | Admitting: Family Medicine

## 2019-04-22 DIAGNOSIS — E1142 Type 2 diabetes mellitus with diabetic polyneuropathy: Secondary | ICD-10-CM

## 2019-04-26 ENCOUNTER — Telehealth: Payer: Self-pay | Admitting: Family Medicine

## 2019-04-26 NOTE — Telephone Encounter (Signed)
[facility] faxed refill request for the following medications:  hydroxychloroquine (PLAQUENIL) 200 MG tablet   Please advise.

## 2019-04-26 NOTE — Telephone Encounter (Signed)
Never prescribed this medication to this patient. She is on Hydrochlorothiazide.

## 2019-04-26 NOTE — Telephone Encounter (Signed)
Anderson Malta, If you get another fax on this one could you please keep it.  I will need it to send back to the pharmacy to show them they are requesting incorrect medication. Thanks

## 2019-04-26 NOTE — Telephone Encounter (Signed)
I don't know why they are requesting this, she does not have it on her list

## 2019-05-02 ENCOUNTER — Other Ambulatory Visit: Payer: Self-pay | Admitting: Family Medicine

## 2019-05-02 DIAGNOSIS — G4700 Insomnia, unspecified: Secondary | ICD-10-CM

## 2019-05-02 NOTE — Telephone Encounter (Signed)
[facility] faxed refill request for the following medications:  zolpidem (AMBIEN) 5 MG tablet  hydrochlorothiazide (MICROZIDE) 12.5 MG capsule   Please advise.

## 2019-05-03 MED ORDER — ZOLPIDEM TARTRATE 5 MG PO TABS: 5.0000 mg | ORAL_TABLET | Freq: Every evening | ORAL | 0 refills | Status: DC | PRN

## 2019-05-17 ENCOUNTER — Encounter: Payer: Self-pay | Admitting: Family Medicine

## 2019-05-17 ENCOUNTER — Other Ambulatory Visit: Payer: Self-pay

## 2019-05-17 ENCOUNTER — Ambulatory Visit (INDEPENDENT_AMBULATORY_CARE_PROVIDER_SITE_OTHER): Payer: Managed Care, Other (non HMO) | Admitting: Family Medicine

## 2019-05-17 VITALS — BP 122/60 | HR 90 | Temp 97.7°F | Wt 261.0 lb

## 2019-05-17 DIAGNOSIS — N390 Urinary tract infection, site not specified: Secondary | ICD-10-CM | POA: Diagnosis not present

## 2019-05-17 LAB — POCT URINALYSIS DIPSTICK
Bilirubin, UA: NEGATIVE
Glucose, UA: NEGATIVE
Ketones, UA: NEGATIVE
Nitrite, UA: NEGATIVE
Protein, UA: POSITIVE — AB
Spec Grav, UA: 1.015 (ref 1.010–1.025)
Urobilinogen, UA: 0.2 E.U./dL
pH, UA: 7.5 (ref 5.0–8.0)

## 2019-05-17 MED ORDER — SULFAMETHOXAZOLE-TRIMETHOPRIM 800-160 MG PO TABS: 1.0000 | ORAL_TABLET | Freq: Two times a day (BID) | ORAL | 0 refills | Status: DC

## 2019-05-17 NOTE — Progress Notes (Signed)
Melody Brooks  MRN: [id number] DOB: [birth year]  Subjective:  HPI   The patient is a 63 year old female who presents for evaluation of symptoms consistent of UTI.  She states that she has had the symptoms for about 10 days.  She began taking AZO and it helped a little but not much.  Patient Active Problem List   Diagnosis Date Noted   Morbid obesity with BMI of 40.0-44.9, adult ([place]) 03/10/2018   Carotid stenosis 12/16/2017   Accumulation of fluid in tissues 07/08/2015   Arterial fibromuscular dysplasia ([place]) 07/08/2015   Acid reflux 07/08/2015   Left arm pain 03/12/2015   Obesity 03/12/2015   Essential hypertension 03/12/2015   Bursitis, trochanteric 12/12/2014   Lumbosacral radiculitis 12/11/2014   Angio-edema 05/24/2013   Internal hemorrhoids with complication 35/46/5681   Hyperlipemia 12/04/2010   PALPITATIONS 12/04/2010   DYSPNEA 12/04/2010   ABNORMAL EKG 12/04/2010   Inflammation with thrombosis 04/23/2010   Anemia, iron deficiency 03/06/2008   B12 deficiency 03/06/2008   Avitaminosis D 03/06/2008   Insomnia 03/01/2008   Idiopathic peripheral neuropathy 12/23/2003   Diabetes mellitus with polyneuropathy ([place]) 07/17/2003   Idiopathic scoliosis 07/30/2002    Past Medical History:  Diagnosis Date   Anemia    Angioedema    Arthritis    Constipation    Diabetes mellitus    Patient takes Janumet and Actos.   Fatigue    Fibromuscular dysplasia (H[facility])    Generalized headaches    GERD (gastroesophageal reflux disease)    Hemorrhoids    Hiatal hernia    Hypertension    Neuropathy    Palpitations    Rectal bleeding    Rectal pain    Past Surgical History:  Procedure Laterality Date   colonoscopy  2010   EXCISIONAL HEMORRHOIDECTOMY  03/2012   right and left eye cataract surgery  2006, 2008   right in 2006 and left 2008   TUBAL LIGATION  1995   Family History  Problem Relation Age of Onset   Heart failure Mother     COPD Mother    Hypertension Mother    Arthritis Mother    Cancer Father        prostate   Parkinson's disease Father    Colon cancer Father    Cancer Maternal Aunt        colon   Cancer Paternal Grandmother        stomach   Diabetes Brother    Diabetes Brother    Social History   Socioeconomic History   Marital status: Married    Spouse name: Not on file   Number of children: Not on file   Years of education: Not on file   Highest education level: Not on file  Occupational History   Not on file  Social Needs   Financial resource strain: Not on file   Food insecurity    Worry: Not on file    Inability: Not on file   Transportation needs    Medical: Not on file    Non-medical: Not on file  Tobacco Use   Smoking status: Never Smoker   Smokeless tobacco: Never Used   Tobacco comment: tobacco use- no  Substance and Sexual Activity   Alcohol use: No   Drug use: No   Sexual activity: Not on file  Lifestyle   Physical activity    Days per week: Not on file    Minutes per session: Not on file   Stress:  Not on file  Relationships   Social connections    Talks on phone: Not on file    Gets together: Not on file    Attends religious service: Not on file    Active member of club or organization: Not on file    Attends meetings of clubs or organizations: Not on file    Relationship status: Not on file   Intimate partner violence    Fear of current or ex partner: Not on file    Emotionally abused: Not on file    Physically abused: Not on file    Forced sexual activity: Not on file  Other Topics Concern   Not on file  Social History Narrative   Full time. Married. Regularly exercises- walks.    Outpatient Encounter Medications as of 05/17/2019  Medication Sig   acetaminophen (TYLENOL) 325 MG tablet Take 1 tablet by mouth as needed.   Alcohol Swabs PADS Used to check blood sugars 3-4 times a day. Dx E11.9.   aspirin 81 MG tablet  Take 81 mg by mouth daily.   cholecalciferol (VITAMIN D) 1000 units tablet TAKE 1 TABLET BY MOUTH  DAILY   DiphenhydrAMINE HCl (BENADRYL ALLERGY PO) Take by mouth 2 (two) times daily.   EPIPEN 2-PAK 0.3 MG/0.3ML SOAJ injection as needed.   estrogen, conjugated,-medroxyprogesterone (PREMPRO) 0.3-1.5 MG per tablet Take 1 tablet by mouth daily.   ferrous sulfate 325 (65 FE) MG EC tablet Take by mouth.   glipiZIDE (GLUCOTROL XL) 2.5 MG 24 hr tablet TAKE 1 TABLET BY MOUTH  DAILY WITH BREAKFAST   hydrochlorothiazide (MICROZIDE) 12.5 MG capsule TAKE 1 CAPSULE BY MOUTH  DAILY   ibuprofen (ADVIL,MOTRIN) 200 MG tablet IBUPROFEN, 200MG  (Oral Tablet)  2 to 4 tabs three times a day as needed for 0 days  Quantity: 60.00;  Refills: 0   Ordered :28-Aug-2010  Margarita Rana MD;  Started 23-April-2010 Active Comments: Medication taken as needed. DX: 451.9   JANUMET 50-1000 MG tablet TAKE 1 TABLET BY MOUTH TWO  TIMES DAILY   Lancets (ONETOUCH ULTRASOFT) lancets CHECK FASTING BLOOD SUGAR  EVERY MORNING . TEST UP TO  4 TIMES DAILY IF HAVING  HYPOGLYCEMIA   levocetirizine (XYZAL) 5 MG tablet Take 1 tablet by mouth 2 (two) times daily.    LORazepam (ATIVAN) 2 MG tablet TAKE 1 TABLET BY MOUTH AT  BEDTIME AS NEEDED   losartan (COZAAR) 50 MG tablet TAKE 1 TABLET BY MOUTH  DAILY   methocarbamol (ROBAXIN) 750 MG tablet TAKE 1/2 TO 1 TABLET BY MOUTH TWICE A DAY AS NEEDED   ONETOUCH ULTRA test strip USE TO CHECK BLOOD SUGAR  (FASTING) EVERY MORNING.  TEST UP TO 4 TIMES DAILY IF HAVING HYPOGLYCEMIA.   pioglitazone (ACTOS) 45 MG tablet TAKE 1 TABLET BY MOUTH AT  BEDTIME   pregabalin (LYRICA) 100 MG capsule Take 1 capsule (100 mg total) by mouth 3 (three) times daily.   vitamin B-12 (CYANOCOBALAMIN) 1000 MCG tablet Take by mouth.   zolpidem (AMBIEN) 5 MG tablet Take 1 tablet (5 mg total) by mouth at bedtime as needed.   [DISCONTINUED] ranitidine (ZANTAC) 150 MG capsule TAKE 1 CAPSULE BY MOUTH TWO TIMES  DAILY   No facility-administered encounter medications on file as of 05/17/2019.    Allergies  Allergen Reactions   Lisinopril Swelling and Hives    Angioedema Angioedema   Review of Systems  Constitutional: Positive for chills. Negative for fever and malaise/fatigue.  Genitourinary: Positive for dysuria, frequency and urgency.  Negative for flank pain and hematuria.    Objective:  BP 122/60 (BP Location: Right Arm, Patient Position: Sitting, Cuff Size: Normal)    Pulse 90    Temp 97.7 F (36.5 C) (Oral)    Wt 261 lb (118.4 kg)    LMP 03/26/2012 (Approximate)    SpO2 99%    BMI 40.88 kg/m   Physical Exam  Constitutional: She is oriented to person, place, and time and well-developed, well-nourished, and in no distress.  HENT:  Head: Normocephalic.  Eyes: Conjunctivae are normal.  Neck: Neck supple.  Cardiovascular: Normal rate and regular rhythm.  Pulmonary/Chest: Effort normal and breath sounds normal.  Abdominal: Soft. Bowel sounds are normal. She exhibits no mass. There is abdominal tenderness.  Suprapubic tenderness to palpation. No CVA tenderness to percussion posteriorly.  Musculoskeletal: Normal range of motion.  Neurological: She is alert and oriented to person, place, and time.  Skin: No rash noted.  Psychiatric: Mood, affect and judgment normal.    Assessment and Plan :   1. Urinary tract infection without hematuria, site unspecified Developed bladder pressure, urgency, frequency and pyuria with hematuria over the past 7-10 days. No fever or back pains. Urinalysis today confirms infection. Will get C&S and start Bactrim-DS BID. May use AZO-Standard prn dysuria. Increase fluid intake and recheck pending lab report. - POCT urinalysis dipstick - sulfamethoxazole-trimethoprim (BACTRIM DS) 800-160 MG tablet; Take 1 tablet by mouth 2 (two) times daily.  Dispense: 20 tablet; Refill: 0 - CULTURE, URINE COMPREHENSIVE

## 2019-05-20 LAB — CULTURE, URINE COMPREHENSIVE

## 2019-05-21 ENCOUNTER — Telehealth: Payer: Self-pay

## 2019-05-21 NOTE — Telephone Encounter (Signed)
-----   Message from Margo [place], [place] sent at 05/20/2019  8:14 PM EDT ----- Urine culture isolated E.coli bacteria sensitive to the sulfa antibiotic sent to the pharmacy on Thursday 05-17-19. Finish all this antibiotic and drink extra fluids to flush out urinary tract. Recheck if need after finishing antibiotic.

## 2019-05-21 NOTE — Telephone Encounter (Signed)
[id number] 

## 2019-05-24 NOTE — Telephone Encounter (Signed)
LVMTRC 

## 2019-05-28 NOTE — Telephone Encounter (Signed)
Pt advised.   Thanks,   -Laura  

## 2019-05-30 ENCOUNTER — Other Ambulatory Visit: Payer: Self-pay | Admitting: Physical Medicine and Rehabilitation

## 2019-05-30 DIAGNOSIS — M5416 Radiculopathy, lumbar region: Secondary | ICD-10-CM

## 2019-05-30 DIAGNOSIS — M25551 Pain in right hip: Secondary | ICD-10-CM

## 2019-06-12 ENCOUNTER — Ambulatory Visit
Admission: RE | Admit: 2019-06-12 | Discharge: 2019-06-12 | Disposition: A | Discharge status: Home / Self Care | Payer: Managed Care, Other (non HMO) | Source: Ambulatory Visit | Attending: Physical Medicine and Rehabilitation | Admitting: Physical Medicine and Rehabilitation

## 2019-06-12 ENCOUNTER — Other Ambulatory Visit: Payer: Self-pay

## 2019-06-12 DIAGNOSIS — M25551 Pain in right hip: Secondary | ICD-10-CM | POA: Insufficient documentation

## 2019-06-12 DIAGNOSIS — M5416 Radiculopathy, lumbar region: Secondary | ICD-10-CM

## 2019-06-14 ENCOUNTER — Other Ambulatory Visit: Payer: Self-pay | Admitting: Family Medicine

## 2019-06-14 DIAGNOSIS — G4700 Insomnia, unspecified: Secondary | ICD-10-CM

## 2019-06-15 ENCOUNTER — Encounter: Payer: Self-pay | Admitting: Podiatry

## 2019-06-15 ENCOUNTER — Ambulatory Visit (INDEPENDENT_AMBULATORY_CARE_PROVIDER_SITE_OTHER): Payer: Managed Care, Other (non HMO) | Admitting: Podiatry

## 2019-06-15 ENCOUNTER — Other Ambulatory Visit: Payer: Self-pay

## 2019-06-15 VITALS — Temp 97.1°F

## 2019-06-15 DIAGNOSIS — L989 Disorder of the skin and subcutaneous tissue, unspecified: Secondary | ICD-10-CM

## 2019-06-15 DIAGNOSIS — L84 Corns and callosities: Secondary | ICD-10-CM

## 2019-06-15 DIAGNOSIS — E1142 Type 2 diabetes mellitus with diabetic polyneuropathy: Secondary | ICD-10-CM | POA: Diagnosis not present

## 2019-06-18 NOTE — Progress Notes (Signed)
   Subjective: 63 y.o. female presenting to the office today with a chief complaint of stabbing, shooting pain of the left plantar foot secondary to lesions that appeared 3-4 months ago. Walking increases the pain. She has tried using corn pads for treatment with no significant relief. Patient is here for further evaluation and treatment.   Past Medical History:  Diagnosis Date  . Anemia   . Angioedema   . Arthritis   . Constipation   . Diabetes mellitus    Patient takes Janumet and Actos.  . Fatigue   . Fibromuscular dysplasia ([place])   . Generalized headaches   . GERD (gastroesophageal reflux disease)   . Hemorrhoids   . Hiatal hernia   . Hypertension   . Neuropathy   . Palpitations   . Rectal bleeding   . Rectal pain      Objective:  Physical Exam General: Alert and oriented x3 in no acute distress  Dermatology: Hyperkeratotic lesion(s) present on the bilateral feet x 3. Pain on palpation with a central nucleated core noted. Skin is warm, dry and supple bilateral lower extremities. Negative for open lesions or macerations.  Vascular: Palpable pedal pulses bilaterally. No edema or erythema noted. Capillary refill within normal limits.  Neurological: Epicritic and protective threshold grossly intact bilaterally.   Musculoskeletal Exam: Pain on palpation at the keratotic lesion(s) noted. Range of motion within normal limits bilateral. Muscle strength 5/5 in all groups bilateral.  Assessment: 1. Porokeratosis bilateral x 3   Plan of Care:  1. Patient evaluated 2. Excisional debridement of keratoic lesion using a chisel blade was performed without incident. Salinocaine applied.  3. Dressed area with light dressing. 4. Patient is to return to the clinic PRN.   Edrick Kins, DPM [facility]  Dr. Edrick Kins, [address]                                        [place], [place] [address]                Office [phone]  Fax [phone]

## 2019-06-28 ENCOUNTER — Other Ambulatory Visit: Payer: Self-pay | Admitting: Family Medicine

## 2019-06-28 DIAGNOSIS — E1142 Type 2 diabetes mellitus with diabetic polyneuropathy: Secondary | ICD-10-CM

## 2019-07-10 ENCOUNTER — Other Ambulatory Visit: Payer: Self-pay | Admitting: Family Medicine

## 2019-07-10 DIAGNOSIS — G4700 Insomnia, unspecified: Secondary | ICD-10-CM

## 2019-07-10 MED ORDER — LORAZEPAM 2 MG PO TABS: 2.0000 mg | ORAL_TABLET | Freq: Every evening | ORAL | 0 refills | Status: DC | PRN

## 2019-07-10 NOTE — Telephone Encounter (Signed)
Pt needs a 90- day supply on her Lorazepam 2 mg.  She said the last refill she got was for only 30 days and she to pay the same for 30 asa she would for 90 days  [place]

## 2019-07-10 NOTE — Telephone Encounter (Signed)
L.O.V. was 05/17/2019, please advise.

## 2019-09-10 DIAGNOSIS — E119 Type 2 diabetes mellitus without complications: Secondary | ICD-10-CM | POA: Insufficient documentation

## 2019-09-10 DIAGNOSIS — L508 Other urticaria: Secondary | ICD-10-CM

## 2019-09-10 HISTORY — DX: Other urticaria: L50.8

## 2019-09-12 ENCOUNTER — Other Ambulatory Visit: Payer: Self-pay | Admitting: Family Medicine

## 2019-09-12 DIAGNOSIS — G4700 Insomnia, unspecified: Secondary | ICD-10-CM

## 2019-09-12 NOTE — Telephone Encounter (Signed)
Wanting 90 day supply refill of: zolpidem (AMBIEN) 5 MG tablet  Please call into:  [place], [place] [phone] (Phone) [phone] (Fax)   Thanks, [facility]

## 2019-09-13 ENCOUNTER — Other Ambulatory Visit: Payer: Self-pay

## 2019-09-13 ENCOUNTER — Encounter: Payer: Self-pay | Admitting: Family Medicine

## 2019-09-13 DIAGNOSIS — E1142 Type 2 diabetes mellitus with diabetic polyneuropathy: Secondary | ICD-10-CM

## 2019-09-13 MED ORDER — ZOLPIDEM TARTRATE 5 MG PO TABS: 5.0000 mg | ORAL_TABLET | Freq: Every evening | ORAL | 0 refills | Status: DC | PRN

## 2019-09-13 MED ORDER — ONETOUCH ULTRA MINI W/DEVICE KIT: 1.0000 | PACK | Freq: Every day | 0 refills | Status: DC

## 2019-09-13 NOTE — Progress Notes (Signed)
one

## 2019-09-17 ENCOUNTER — Other Ambulatory Visit: Payer: Self-pay | Admitting: Family Medicine

## 2019-09-17 DIAGNOSIS — G4700 Insomnia, unspecified: Secondary | ICD-10-CM

## 2019-09-17 DIAGNOSIS — I10 Essential (primary) hypertension: Secondary | ICD-10-CM

## 2019-09-17 DIAGNOSIS — E1142 Type 2 diabetes mellitus with diabetic polyneuropathy: Secondary | ICD-10-CM

## 2019-09-17 NOTE — Telephone Encounter (Signed)
[facility] faxed refill request for the following medications:  zolpidem (AMBIEN) 5 MG tablet   Please advise.  Thanks, [employer]

## 2019-10-10 ENCOUNTER — Other Ambulatory Visit: Payer: Self-pay | Admitting: Family Medicine

## 2019-10-10 DIAGNOSIS — G4700 Insomnia, unspecified: Secondary | ICD-10-CM

## 2019-10-10 DIAGNOSIS — E1142 Type 2 diabetes mellitus with diabetic polyneuropathy: Secondary | ICD-10-CM

## 2019-10-24 ENCOUNTER — Other Ambulatory Visit: Payer: Self-pay

## 2019-10-24 ENCOUNTER — Telehealth: Payer: Self-pay | Admitting: Family Medicine

## 2019-10-24 MED ORDER — ALCOHOL SWABS 70 % PADS: 1.0000 | MEDICATED_PAD | Freq: Two times a day (BID) | 12 refills | Status: DC

## 2019-10-24 MED ORDER — CONTOUR NEXT TEST VI STRP: ORAL_STRIP | 12 refills | Status: DC

## 2019-10-24 NOTE — Telephone Encounter (Signed)
Pt stated she has a new glucose meter, Contour Next. She needs a rx for strips sent to her pharmacy as well as alcohols wipes. Please advise.  Contour Next test strips 90 day supply Alcohol Wipes  [place], [place] [employer] Phone:  [phone]  Fax:  [phone]

## 2019-11-01 ENCOUNTER — Encounter: Payer: Self-pay | Admitting: Family Medicine

## 2019-11-06 DIAGNOSIS — C4491 Basal cell carcinoma of skin, unspecified: Secondary | ICD-10-CM | POA: Insufficient documentation

## 2019-11-09 ENCOUNTER — Encounter: Payer: Managed Care, Other (non HMO) | Admitting: Family Medicine

## 2019-12-03 ENCOUNTER — Other Ambulatory Visit: Payer: Self-pay | Admitting: Family Medicine

## 2019-12-03 DIAGNOSIS — G4700 Insomnia, unspecified: Secondary | ICD-10-CM

## 2019-12-03 NOTE — Telephone Encounter (Signed)
Copied from [place] [id number]. Topic: Quick Communication - Rx Refill/Question >> Dec 03, 2019  2:40 PM Leward Quan A wrote: Medication: zolpidem (AMBIEN) 5 MG tablet  need 90 day refill   Has the patient contacted their pharmacy? Yes.   (Agent: If no, request that the patient contact the pharmacy for the refill.) (Agent: If yes, when and what did the pharmacy advise?)  Preferred Pharmacy (with phone number or street name): [place], [place] [employer]  Phone:  [phone] Fax:  [phone]     Agent: Please be advised that RX refills may take up to 3 business days. We ask that you follow-up with your pharmacy.

## 2019-12-03 NOTE — Telephone Encounter (Signed)
Requested medication (s) are due for refill today: yes  Requested medication (s) are on the active medication list: {yes  Last refill:  12//2020  Future visit scheduled: yes  Notes to clinic: not delegated    Requested Prescriptions  Pending Prescriptions Disp Refills   zolpidem (AMBIEN) 5 MG tablet 30 tablet 0    Sig: Take 1 tablet (5 mg total) by mouth at bedtime as needed.      Not Delegated - Psychiatry:  Anxiolytics/Hypnotics Failed - 12/03/2019  2:48 PM      Failed - This refill cannot be delegated      Failed - Urine Drug Screen completed in last 360 days.      Failed - Valid encounter within last 6 months    Recent Outpatient Visits           6 months ago Urinary tract infection without hematuria, site unspecified   [place], [place], [place]   1 year ago Essential hypertension   [facility], Vickki Muff, [place]   1 year ago Cystitis with hematuria   [facility] [place], [place]   1 year ago Annual physical exam   [place], [place]   2 years ago Essential hypertension   [facility], Vickki Muff, [place]

## 2019-12-04 ENCOUNTER — Encounter: Payer: Managed Care, Other (non HMO) | Admitting: Family Medicine

## 2019-12-05 MED ORDER — ZOLPIDEM TARTRATE 5 MG PO TABS: 5.0000 mg | ORAL_TABLET | Freq: Every evening | ORAL | 0 refills | Status: DC | PRN

## 2019-12-11 ENCOUNTER — Other Ambulatory Visit: Payer: Self-pay | Admitting: Family Medicine

## 2019-12-11 DIAGNOSIS — G4700 Insomnia, unspecified: Secondary | ICD-10-CM

## 2019-12-11 NOTE — Telephone Encounter (Signed)
[facility] faxed refill request for the following medications:  zolpidem (AMBIEN) 5 MG tablet -2nd Attempt   Please advise.  Thanks, [employer]

## 2019-12-12 ENCOUNTER — Other Ambulatory Visit: Payer: Self-pay | Admitting: Family Medicine

## 2019-12-12 DIAGNOSIS — I10 Essential (primary) hypertension: Secondary | ICD-10-CM

## 2019-12-12 DIAGNOSIS — E1142 Type 2 diabetes mellitus with diabetic polyneuropathy: Secondary | ICD-10-CM

## 2019-12-12 NOTE — Telephone Encounter (Signed)
Requested medication (s) are due for refill today: yes  Requested medication (s) are on the active medication list: yes  Last refill:  10/10/2019  Future visit scheduled: yes  Notes to clinic:  no valid encounter within last 6 months Patient has appointment on 12/17/2019   Requested Prescriptions  Pending Prescriptions Disp Refills   losartan (COZAAR) 50 MG tablet [Pharmacy Med Name: LOSARTAN  50MG   TAB] 90 tablet 3    Sig: TAKE 1 TABLET BY MOUTH  DAILY      Cardiovascular:  Angiotensin Receptor Blockers Failed - 12/12/2019  6:00 AM      Failed - Cr in normal range and within 180 days    Creatinine, Ser  Date Value Ref Range Status  09/11/2018 1.01 (H) 0.57 - 1.00 mg/dL Final          Failed - K in normal range and within 180 days    Potassium  Date Value Ref Range Status  09/11/2018 4.3 3.5 - 5.2 mmol/L Final          Failed - Valid encounter within last 6 months    Recent Outpatient Visits           6 months ago Urinary tract infection without hematuria, site unspecified   [facility], Vickki Muff, [place]   1 year ago Essential hypertension   [facility], [place], [place]   1 year ago Cystitis with hematuria   [place], [place], [place]   1 year ago Annual physical exam   [facility], Vickki Muff, [place]   2 years ago Essential hypertension   [facility], Vickki Muff, [place] - Patient is not pregnant      Passed - Last BP in normal range    BP Readings from Last 1 Encounters:  05/17/19 122/60            hydrochlorothiazide (MICROZIDE) 12.5 MG capsule [Pharmacy Med Name: HYDROCHLOROTHIAZIDE  12.5MG   CAP] 90 capsule 3    Sig: TAKE 1 CAPSULE BY MOUTH  DAILY      Cardiovascular: Diuretics - Thiazide Failed - 12/12/2019  6:00 AM      Failed - Ca in normal range and within 360 days    Calcium  Date Value Ref Range Status  09/11/2018 9.5 8.7 -  10.3 mg/dL Final          Failed - Cr in normal range and within 360 days    Creatinine, Ser  Date Value Ref Range Status  09/11/2018 1.01 (H) 0.57 - 1.00 mg/dL Final          Failed - K in normal range and within 360 days    Potassium  Date Value Ref Range Status  09/11/2018 4.3 3.5 - 5.2 mmol/L Final          Failed - Na in normal range and within 360 days    Sodium  Date Value Ref Range Status  09/11/2018 144 134 - 144 mmol/L Final          Failed - Valid encounter within last 6 months    Recent Outpatient Visits           6 months ago Urinary tract infection without hematuria, site unspecified   [facility], Vickki Muff, [place]   1 year ago Essential hypertension   [facility], [place]   1 year ago Cystitis with hematuria   [place], [place]   1 year ago Annual physical exam   [place], [place]   2 years ago Essential hypertension   [facility], Vickki Muff, [place]              Passed - Last BP in normal range    BP Readings from Last 1 Encounters:  05/17/19 122/60            glipiZIDE (GLUCOTROL XL) 2.5 MG 24 hr tablet [Pharmacy Med Name: GLIPIZIDE  2.5MG   TAB  XL] 90 tablet 3    Sig: TAKE 1 TABLET BY MOUTH  DAILY WITH BREAKFAST      Endocrinology:  Diabetes - Sulfonylureas Failed - 12/12/2019  6:00 AM      Failed - HBA1C is between 0 and 7.9 and within 180 days    Hgb A1c MFr Bld  Date Value Ref Range Status  09/11/2018 5.8 (H) 4.8 - 5.6 % Final    Comment:             Prediabetes: 5.7 - 6.4          Diabetes: >6.4          Glycemic control for adults with diabetes: <7.0           Failed - Valid encounter within last 6 months    Recent Outpatient Visits           6 months ago Urinary tract infection without hematuria, site unspecified   [facility], Vickki Muff, [place]   1 year ago Essential  hypertension   [facility], Vickki Muff, [place]   1 year ago Cystitis with hematuria   [facility] [place], [place]   1 year ago Annual physical exam   [facility], Vickki Muff, [place]   2 years ago Essential hypertension   [facility], [place], [place]

## 2019-12-13 NOTE — Progress Notes (Signed)
Patient: Melody Brooks, Female    DOB: September 19, 1956, 64 y.o.   MRN: [id number] Visit Date: 12/17/2019  Today's Provider: Vernie Murders, PA   Chief Complaint  Patient presents with  . Annual Exam   Subjective:  Melody Brooks is a 64 y.o. female who presents today for health maintenance and complete physical. She feels well. She reports exercising some. She reports she is sleeping well if using medication.  Focus Metrics: Hgb A1C Health Maintenance: Mammogram, Diabetic Eye and Foot exam, Hgb A1C Screenings: PHQ2, Fall Risk, Exercise, Function, Audit C  Review of Systems  Constitutional: Negative.   HENT: Positive for tinnitus.   Eyes: Positive for photophobia.  Respiratory: Negative.   Cardiovascular: Positive for palpitations and leg swelling.  Gastrointestinal: Positive for abdominal distention.  Endocrine: Negative.   Genitourinary: Negative.   Musculoskeletal: Positive for arthralgias, back pain, joint swelling and myalgias.  Skin: Positive for color change (left foot).  Allergic/Immunologic: Positive for immunocompromised state.  Neurological: Negative.   Hematological: Negative.   Psychiatric/Behavioral: Negative.     Social History   Socioeconomic History  . Marital status: Married    Spouse name: Not on file  . Number of children: Not on file  . Years of education: Not on file  . Highest education level: Not on file  Occupational History  . Not on file  Tobacco Use  . Smoking status: Never Smoker  . Smokeless tobacco: Never Used  . Tobacco comment: tobacco use- no  Substance and Sexual Activity  . Alcohol use: No  . Drug use: No  . Sexual activity: Not on file  Other Topics Concern  . Not on file  Social History Narrative   Full time. Married. Regularly exercises- walks.    Social Determinants of Health   Financial Resource Strain:   . Difficulty of Paying Living Expenses: Not on file  Food Insecurity:   . Worried About Charity fundraiser in the  Last Year: Not on file  . Ran Out of Food in the Last Year: Not on file  Transportation Needs:   . Lack of Transportation (Medical): Not on file  . Lack of Transportation (Non-Medical): Not on file  Physical Activity:   . Days of Exercise per Week: Not on file  . Minutes of Exercise per Session: Not on file  Stress:   . Feeling of Stress : Not on file  Social Connections:   . Frequency of Communication with Friends and Family: Not on file  . Frequency of Social Gatherings with Friends and Family: Not on file  . Attends Religious Services: Not on file  . Active Member of Clubs or Organizations: Not on file  . Attends Archivist Meetings: Not on file  . Marital Status: Not on file  Intimate Partner Violence:   . Fear of Current or Ex-Partner: Not on file  . Emotionally Abused: Not on file  . Physically Abused: Not on file  . Sexually Abused: Not on file    Patient Active Problem List   Diagnosis Date Noted  . Dermatochalasis of eyelid 08/14/2018  . Ptosis, both eyelids 08/14/2018  . Visual field defect 08/14/2018  . Morbid obesity with BMI of 40.0-44.9, adult ([place]) 03/10/2018  . Carotid stenosis 12/16/2017  . Accumulation of fluid in tissues 07/08/2015  . Arterial fibromuscular dysplasia ([place]) 07/08/2015  . Acid reflux 07/08/2015  . Left arm pain 03/12/2015  . Obesity 03/12/2015  . Essential hypertension 03/12/2015  . Bursitis, trochanteric 12/12/2014  .  Lumbosacral radiculitis 12/11/2014  . Angio-edema 05/24/2013  . Internal hemorrhoids with complication 74/10/8785  . Hyperlipemia 12/04/2010  . PALPITATIONS 12/04/2010  . DYSPNEA 12/04/2010  . ABNORMAL EKG 12/04/2010  . Inflammation with thrombosis 04/23/2010  . Anemia, iron deficiency 03/06/2008  . B12 deficiency 03/06/2008  . Avitaminosis D 03/06/2008  . Insomnia 03/01/2008  . Idiopathic peripheral neuropathy 12/23/2003  . Diabetes mellitus with polyneuropathy ([place]) 07/17/2003  . Idiopathic scoliosis  07/30/2002    Past Surgical History:  Procedure Laterality Date  . colonoscopy  2010  . EXCISIONAL HEMORRHOIDECTOMY  03/2012  . right and left eye cataract surgery  2006, 2008   right in 2006 and left 2008  . TUBAL LIGATION  1995    Her family history includes Arthritis in her mother; COPD in her mother; Cancer in her father, maternal aunt, and paternal grandmother; Colon cancer in her father; Diabetes in her brother and brother; Heart failure in her mother; Hypertension in her mother; Parkinson's disease in her father.     Outpatient Encounter Medications as of 12/17/2019  Medication Sig  . acetaminophen (TYLENOL) 325 MG tablet Take 1 tablet by mouth as needed.  . Alcohol Swabs 70 % PADS 1 each by Does not apply route 2 (two) times daily.  . Alcohol Swabs PADS Used to check blood sugars 3-4 times a day. Dx E11.9.  Marland Kitchen aspirin 81 MG tablet Take 81 mg by mouth daily.  . Blood Glucose Monitoring Suppl (ONE TOUCH ULTRA MINI) w/Device KIT 1 each by Does not apply route daily.  . cholecalciferol (VITAMIN D) 1000 units tablet TAKE 1 TABLET BY MOUTH  DAILY (Patient taking differently: 5,000 Units. )  . DEXILANT 60 MG capsule   . DiphenhydrAMINE HCl (BENADRYL ALLERGY PO) Take by mouth 2 (two) times daily.  Marland Kitchen EPIPEN 2-PAK 0.3 MG/0.3ML SOAJ injection as needed.  Marland Kitchen estrogen, conjugated,-medroxyprogesterone (PREMPRO) 0.3-1.5 MG per tablet Take 1 tablet by mouth daily.  . famotidine (PEPCID) 40 MG tablet   . glipiZIDE (GLUCOTROL XL) 2.5 MG 24 hr tablet TAKE 1 TABLET BY MOUTH  DAILY WITH BREAKFAST  . glucose blood (CONTOUR NEXT TEST) test strip Use as instructed  . hydrochlorothiazide (MICROZIDE) 12.5 MG capsule TAKE 1 CAPSULE BY MOUTH  DAILY  . hydroxychloroquine (PLAQUENIL) 200 MG tablet   . ibuprofen (ADVIL,MOTRIN) 200 MG tablet IBUPROFEN, 200MG (Oral Tablet)  2 to 4 tabs three times a day as needed for 0 days  Quantity: 60.00;  Refills: 0   Ordered :28-Aug-2010  Margarita Rana MD;  Started  23-April-2010 Active Comments: Medication taken as needed. DX: 451.9  . JANUMET 50-1000 MG tablet TAKE 1 TABLET BY MOUTH TWO  TIMES DAILY  . Lancets (ONETOUCH ULTRASOFT) lancets CHECK FASTING BLOOD SUGAR  EVERY MORNING . TEST UP TO  4 TIMES DAILY IF HAVING  HYPOGLYCEMIA  . levocetirizine (XYZAL) 5 MG tablet Take 1 tablet by mouth 2 (two) times daily.   Marland Kitchen LORazepam (ATIVAN) 2 MG tablet TAKE 1 TABLET BY MOUTH AT  BEDTIME AS NEEDED  . losartan (COZAAR) 50 MG tablet TAKE 1 TABLET BY MOUTH  DAILY  . methocarbamol (ROBAXIN) 750 MG tablet TAKE 1/2 TO 1 TABLET BY MOUTH TWICE A DAY AS NEEDED  . ONETOUCH ULTRA test strip USE TO CHECK BLOOD SUGAR  (FASTING) EVERY MORNING.  TEST UP TO 4 TIMES DAILY IF HAVING HYPOGLYCEMIA.  . pioglitazone (ACTOS) 45 MG tablet TAKE 1 TABLET BY MOUTH AT  BEDTIME  . predniSONE (DELTASONE) 10 MG tablet TAKE 3  PILLS FOR 5 DAYS FOR HIVES/ITCHING  . pregabalin (LYRICA) 100 MG capsule TAKE 1 CAPSULE BY MOUTH 3  TIMES DAILY  . zolpidem (AMBIEN) 5 MG tablet Take 1 tablet (5 mg total) by mouth at bedtime as needed.  . vitamin B-12 (CYANOCOBALAMIN) 1000 MCG tablet Take by mouth.   No facility-administered encounter medications on file as of 12/17/2019.    Patient Care Team: Seletha Zimmermann, Vickki Muff, PA as PCP - General (Family Medicine)      Objective:   Vitals:  Vitals:   12/17/19 1044  BP: (!) 142/66  Pulse: 96  Temp: (!) 96.6 F (35.9 C)  TempSrc: Skin  SpO2: 99%  Weight: 256 lb (116.1 kg)  Height: 5' 7" (1.702 m)  Body mass index is 40.1 kg/m.   Physical Exam Constitutional:      Appearance: She is well-developed.  HENT:     Head: Normocephalic and atraumatic.     Right Ear: External ear normal.     Left Ear: External ear normal.     Nose: Nose normal.  Eyes:     General:        Right eye: No discharge.     Conjunctiva/sclera: Conjunctivae normal.     Pupils: Pupils are equal, round, and reactive to light.  Neck:     Thyroid: No thyromegaly.     Trachea: No  tracheal deviation.  Cardiovascular:     Rate and Rhythm: Normal rate and regular rhythm.     Heart sounds: Normal heart sounds. No murmur.  Pulmonary:     Effort: Pulmonary effort is normal. No respiratory distress.     Breath sounds: Normal breath sounds. No wheezing or rales.  Chest:     Chest wall: No tenderness.  Abdominal:     General: There is no distension.     Palpations: Abdomen is soft. There is no mass.     Tenderness: There is no abdominal tenderness. There is no guarding or rebound.  Genitourinary:    Comments: Exam deferred to GYN (Dr. Lynnette Caffey). Musculoskeletal:        General: No tenderness. Normal range of motion.     Cervical back: Normal range of motion and neck supple.  Lymphadenopathy:     Cervical: No cervical adenopathy.  Skin:    General: Skin is warm and dry.     Findings: No erythema or rash.  Neurological:     Mental Status: She is alert and oriented to person, place, and time.     Cranial Nerves: No cranial nerve deficit.     Motor: No abnormal muscle tone.     Coordination: Coordination normal.     Deep Tendon Reflexes: Reflexes are normal and symmetric. Reflexes normal.  Psychiatric:        Behavior: Behavior normal.        Thought Content: Thought content normal.        Judgment: Judgment normal.    Depression Screen PHQ 2/9 Scores 12/17/2019 03/10/2018 06/03/2017 12/26/2015  PHQ - 2 Score 1 0 0 0  PHQ- 9 Score 5 6 - 5    Assessment & Plan:     Routine Health Maintenance and Physical Exam  Exercise Activities and Dietary recommendations Goals   Must work on low fat diabetic diet, regular exercise and weight loss.     Immunization History  Administered Date(s) Administered  . Influenza,inj,Quad PF,6+ Mos 07/14/2018  . Influenza-Unspecified 08/01/2017, 08/07/2019  . Pneumococcal Polysaccharide-23 09/01/2011  . Tdap 08/28/2010  . Zoster  Recombinat (Shingrix) 12/02/2017, 03/10/2018    Health Maintenance  Topic Date Due  . MAMMOGRAM   06/17/2018  . OPHTHALMOLOGY EXAM  07/29/2018  . HEMOGLOBIN A1C  03/12/2019  . FOOT EXAM  07/15/2019  . PAP SMEAR-Modifier  06/17/2020  . TETANUS/TDAP  08/28/2020  . COLONOSCOPY  01/28/2028  . INFLUENZA VACCINE  Completed  . PNEUMOCOCCAL POLYSACCHARIDE VACCINE AGE 2-64 HIGH RISK  Completed  . Hepatitis C Screening  Completed  . HIV Screening  Completed     Discussed health benefits of physical activity, and encouraged her to engage in regular exercise appropriate for her age and condition.   1. Annual physical exam General health stable. Has tried to decrease Estrogen per Dr. Morris (GYN but had hot flashes restart within a week. Will continue follow up with GYN to taper off. Colonoscopy up to date and had follow up with eye doctor for basal cell cancer on the right upper eyelid in Dec. 2020. Given anticipatory counseling and get routine follow up labs. - CBC with Differential/Platelet - Comprehensive metabolic panel - Lipid Panel With LDL/HDL Ratio - TSH  2. Essential hypertension Fair control of BP. Still taking HCTZ 12.5 mg qd with Losartan 50 mg qd. A little anxious in the office today and systolic pressure elevated. Restrict salt and caffeine consumption. Recheck CBC, CMP and TSH. - CBC with Differential/Platelet - Comprehensive metabolic panel - TSH  3. Pure hypercholesterolemia Trying to follow a low fat diet. Recommend she exercise 30-40 minutes 3-4 times a week and lose weight. Will recheck lipids and CMP. May need to reconsider starting a statin. - Comprehensive metabolic panel - Lipid Panel With LDL/HDL Ratio  4. Morbid obesity with BMI of 40.0-44.9, adult (HCC) BMI over 40 today. Weight loss would help reduce risk of CVD with history of hyperlipidemia, HBP and DM. Recheck routine labs. - CBC with Differential/Platelet - Comprehensive metabolic panel - Lipid Panel With LDL/HDL Ratio - TSH - Hemoglobin A1c  5. Diabetic polyneuropathy associated with type 2 diabetes  mellitus (HCC) FBS averaging 108 - 120 at home. Had one drop to 57 a couple days ago with dry mouth symptoms. A snack quickly got the level back to above 100. Still taking Actos 45 mg hs, Glipizide 2.5 mg at breakfast, Janumet 50-1000 mg BID, Lyrica 100 mg TID and Losartan 50 mg qd. Essentially unchanged peripheral neuropathy in both feet. Recheck labs and continue present medications. - CBC with Differential/Platelet - Comprehensive metabolic panel - Lipid Panel With LDL/HDL Ratio - Hemoglobin A1c  6. Angioedema, subsequent encounter Followed by Dr. Vaught and controlled with Plaquenil 100 mg qd with Benadryl at bedtime. Recheck CBC and CMP. No swelling or hives today. - CBC with Differential/Platelet - Comprehensive metabolic panel  7. Insomnia due to medical condition Uses Lorazepam for anxiety and Zolpidem intermittently at bedtime. Advise her to taper off the Zolpidem and use the Lorazepam only as she is getting to the age of meeting Beer's Criteria.     

## 2019-12-17 ENCOUNTER — Ambulatory Visit (INDEPENDENT_AMBULATORY_CARE_PROVIDER_SITE_OTHER): Payer: Managed Care, Other (non HMO) | Admitting: Family Medicine

## 2019-12-17 ENCOUNTER — Other Ambulatory Visit: Payer: Self-pay

## 2019-12-17 VITALS — BP 142/66 | HR 96 | Temp 96.6°F | Ht 67.0 in | Wt 256.0 lb

## 2019-12-17 DIAGNOSIS — I10 Essential (primary) hypertension: Secondary | ICD-10-CM | POA: Diagnosis not present

## 2019-12-17 DIAGNOSIS — E1142 Type 2 diabetes mellitus with diabetic polyneuropathy: Secondary | ICD-10-CM

## 2019-12-17 DIAGNOSIS — E7800 Pure hypercholesterolemia, unspecified: Secondary | ICD-10-CM

## 2019-12-17 DIAGNOSIS — T783XXD Angioneurotic edema, subsequent encounter: Secondary | ICD-10-CM

## 2019-12-17 DIAGNOSIS — Z0000 Encounter for general adult medical examination without abnormal findings: Secondary | ICD-10-CM | POA: Diagnosis not present

## 2019-12-17 DIAGNOSIS — Z6841 Body Mass Index (BMI) 40.0 and over, adult: Secondary | ICD-10-CM

## 2019-12-17 DIAGNOSIS — G4701 Insomnia due to medical condition: Secondary | ICD-10-CM

## 2019-12-19 ENCOUNTER — Other Ambulatory Visit (INDEPENDENT_AMBULATORY_CARE_PROVIDER_SITE_OTHER): Payer: Self-pay | Admitting: Vascular Surgery

## 2019-12-19 DIAGNOSIS — I6523 Occlusion and stenosis of bilateral carotid arteries: Secondary | ICD-10-CM

## 2019-12-21 ENCOUNTER — Encounter: Payer: Self-pay | Admitting: Family Medicine

## 2019-12-21 ENCOUNTER — Encounter (INDEPENDENT_AMBULATORY_CARE_PROVIDER_SITE_OTHER): Payer: Managed Care, Other (non HMO)

## 2019-12-21 ENCOUNTER — Ambulatory Visit (INDEPENDENT_AMBULATORY_CARE_PROVIDER_SITE_OTHER): Payer: Managed Care, Other (non HMO) | Admitting: Vascular Surgery

## 2019-12-23 ENCOUNTER — Other Ambulatory Visit: Payer: Self-pay

## 2019-12-23 ENCOUNTER — Ambulatory Visit: Payer: Managed Care, Other (non HMO) | Attending: Internal Medicine

## 2019-12-23 DIAGNOSIS — Z23 Encounter for immunization: Secondary | ICD-10-CM

## 2019-12-23 NOTE — Progress Notes (Signed)
   Covid-19 Vaccination Clinic  Name:  Melody Brooks    MRN: [id number] DOB: December 04, 1955  12/23/2019  [name] was observed post Covid-19 immunization for 15 minutes without incidence. She was provided with Vaccine Information Sheet and instruction to access the V-Safe system.   [name] was instructed to call 911 with any severe reactions post vaccine: Marland Kitchen Difficulty breathing  . Swelling of your face and throat  . A fast heartbeat  . A bad rash all over your body  . Dizziness and weakness    Immunizations Administered    Name Date Dose VIS Date Route   Pfizer COVID-19 Vaccine 12/23/2019  9:07 AM 0.3 mL 10/12/2019 Intramuscular   Manufacturer: [place]   Lot: [id number]   [place]: [id number]

## 2020-01-07 ENCOUNTER — Other Ambulatory Visit: Payer: Self-pay | Admitting: Family Medicine

## 2020-01-07 DIAGNOSIS — G4700 Insomnia, unspecified: Secondary | ICD-10-CM

## 2020-01-07 DIAGNOSIS — E1142 Type 2 diabetes mellitus with diabetic polyneuropathy: Secondary | ICD-10-CM

## 2020-01-07 MED ORDER — LORAZEPAM 2 MG PO TABS: 2.0000 mg | ORAL_TABLET | Freq: Every evening | ORAL | 0 refills | Status: DC | PRN

## 2020-01-07 MED ORDER — JANUMET 50-1000 MG PO TABS: 1.0000 | ORAL_TABLET | Freq: Two times a day (BID) | ORAL | 0 refills | Status: DC

## 2020-01-07 NOTE — Telephone Encounter (Signed)
[facility] faxed refill request for the following medications:  LORazepam (ATIVAN) 2 MG tablet JANUMET 50-1000 MG tablet  pregabalin (LYRICA) 100 MG capsule  LOV: 12/17/2019 Please advise. Thanks TNP

## 2020-01-08 ENCOUNTER — Ambulatory Visit (INDEPENDENT_AMBULATORY_CARE_PROVIDER_SITE_OTHER): Payer: Managed Care, Other (non HMO)

## 2020-01-08 ENCOUNTER — Ambulatory Visit (INDEPENDENT_AMBULATORY_CARE_PROVIDER_SITE_OTHER): Payer: Managed Care, Other (non HMO) | Admitting: Vascular Surgery

## 2020-01-08 ENCOUNTER — Encounter (INDEPENDENT_AMBULATORY_CARE_PROVIDER_SITE_OTHER): Payer: Self-pay | Admitting: Vascular Surgery

## 2020-01-08 ENCOUNTER — Other Ambulatory Visit: Payer: Self-pay | Admitting: Family Medicine

## 2020-01-08 ENCOUNTER — Other Ambulatory Visit: Payer: Self-pay

## 2020-01-08 VITALS — BP 125/69 | HR 80 | Resp 14 | Ht 68.0 in | Wt 254.0 lb

## 2020-01-08 DIAGNOSIS — G4700 Insomnia, unspecified: Secondary | ICD-10-CM

## 2020-01-08 DIAGNOSIS — I6523 Occlusion and stenosis of bilateral carotid arteries: Secondary | ICD-10-CM

## 2020-01-08 DIAGNOSIS — I10 Essential (primary) hypertension: Secondary | ICD-10-CM | POA: Diagnosis not present

## 2020-01-08 DIAGNOSIS — E1142 Type 2 diabetes mellitus with diabetic polyneuropathy: Secondary | ICD-10-CM

## 2020-01-08 MED ORDER — ZOLPIDEM TARTRATE 5 MG PO TABS: 5.0000 mg | ORAL_TABLET | Freq: Every evening | ORAL | 0 refills | Status: DC | PRN

## 2020-01-08 MED ORDER — PREGABALIN 100 MG PO CAPS: 100.0000 mg | ORAL_CAPSULE | Freq: Three times a day (TID) | ORAL | 0 refills | Status: DC

## 2020-01-08 NOTE — Progress Notes (Signed)
MRN : [id number]  Melody Brooks is a 64 y.o. (1956-08-28) female who presents with chief complaint of  Chief Complaint  Patient presents with  . Follow-up    U/S Follow Up  .  History of Present Illness: Patient returns in follow-up of her carotid disease.  She is doing well today without any obvious complaints.  No focal neurologic symptoms.  Her carotid disease remains mild on duplex today with 1 to 39% stenosis bilaterally without significant progression from previous studies.  Current Outpatient Medications  Medication Sig Dispense Refill  . acetaminophen (TYLENOL) 325 MG tablet Take 1 tablet by mouth as needed.    . Alcohol Swabs 70 % PADS 1 each by Does not apply route 2 (two) times daily. 100 each 12  . Alcohol Swabs PADS Used to check blood sugars 3-4 times a day. Dx E11.9. 360 each 3  . aspirin 81 MG tablet Take 81 mg by mouth daily.    . Blood Glucose Monitoring Suppl (ONE TOUCH ULTRA MINI) w/Device KIT 1 each by Does not apply route daily. 1 kit 0  . cholecalciferol (VITAMIN D) 1000 units tablet TAKE 1 TABLET BY MOUTH  DAILY (Patient taking differently: 5,000 Units. ) 100 tablet 3  . DEXILANT 60 MG capsule     . DiphenhydrAMINE HCl (BENADRYL ALLERGY PO) Take by mouth 2 (two) times daily.    Marland Kitchen EPIPEN 2-PAK 0.3 MG/0.3ML SOAJ injection as needed.    Marland Kitchen estrogen, conjugated,-medroxyprogesterone (PREMPRO) 0.3-1.5 MG per tablet Take 1 tablet by mouth daily.    . famotidine (PEPCID) 40 MG tablet     . glipiZIDE (GLUCOTROL XL) 2.5 MG 24 hr tablet TAKE 1 TABLET BY MOUTH  DAILY WITH BREAKFAST 30 tablet 0  . glucose blood (CONTOUR NEXT TEST) test strip Use as instructed 100 each 12  . hydrochlorothiazide (MICROZIDE) 12.5 MG capsule TAKE 1 CAPSULE BY MOUTH  DAILY 30 capsule 0  . hydroxychloroquine (PLAQUENIL) 200 MG tablet     . ibuprofen (ADVIL,MOTRIN) 200 MG tablet IBUPROFEN, 200MG (Oral Tablet)  2 to 4 tabs three times a day as needed for 0 days  Quantity: 60.00;  Refills: 0   Ordered :28-Aug-2010  Margarita Rana MD;  Started 23-April-2010 Active Comments: Medication taken as needed. DX: 451.9    . Lancets (ONETOUCH ULTRASOFT) lancets CHECK FASTING BLOOD SUGAR  EVERY MORNING . TEST UP TO  4 TIMES DAILY IF HAVING  HYPOGLYCEMIA 400 each 3  . levocetirizine (XYZAL) 5 MG tablet Take 1 tablet by mouth 2 (two) times daily.     Marland Kitchen LORazepam (ATIVAN) 2 MG tablet Take 1 tablet (2 mg total) by mouth at bedtime as needed. 90 tablet 0  . losartan (COZAAR) 50 MG tablet TAKE 1 TABLET BY MOUTH  DAILY 30 tablet 0  . methocarbamol (ROBAXIN) 750 MG tablet TAKE 1/2 TO 1 TABLET BY MOUTH TWICE A DAY AS NEEDED  5  . ONETOUCH ULTRA test strip USE TO CHECK BLOOD SUGAR  (FASTING) EVERY MORNING.  TEST UP TO 4 TIMES DAILY IF HAVING HYPOGLYCEMIA. 400 each 3  . pioglitazone (ACTOS) 45 MG tablet TAKE 1 TABLET BY MOUTH AT  BEDTIME 90 tablet 3  . predniSONE (DELTASONE) 10 MG tablet TAKE 3 PILLS FOR 5 DAYS FOR HIVES/ITCHING    . sitaGLIPtin-metformin (JANUMET) 50-1000 MG tablet Take 1 tablet by mouth 2 (two) times daily. 180 tablet 0  . Zinc 10 MG LOZG Take 1 Each/kg by mouth.    . pregabalin (LYRICA)  100 MG capsule Take 1 capsule (100 mg total) by mouth 3 (three) times daily. 270 capsule 0  . zolpidem (AMBIEN) 5 MG tablet Take 1 tablet (5 mg total) by mouth at bedtime as needed. 30 tablet 0   No current facility-administered medications for this visit.    Past Medical History:  Diagnosis Date  . Anemia   . Angioedema   . Arthritis   . Cancer ([place])   . Constipation   . Diabetes mellitus    Patient takes Janumet and Actos.  . Fatigue   . Fibromuscular dysplasia ([place])   . Generalized headaches   . GERD (gastroesophageal reflux disease)   . Hemorrhoids   . Hiatal hernia   . Hypertension   . Neuropathy   . Palpitations   . Rectal bleeding   . Rectal pain     Past Surgical History:  Procedure Laterality Date  . Basal cell cancer  removal    . colonoscopy  2010  . EXCISIONAL  HEMORRHOIDECTOMY  03/2012  . right and left eye cataract surgery  2006, 2008   right in 2006 and left 2008  . TUBAL LIGATION  1995     Social History   Tobacco Use  . Smoking status: Never Smoker  . Smokeless tobacco: Never Used  . Tobacco comment: tobacco use- no  Substance Use Topics  . Alcohol use: No  . Drug use: No    Family History  Problem Relation Age of Onset  . Heart failure Mother   . COPD Mother   . Hypertension Mother   . Arthritis Mother   . Cancer Father        prostate  . Parkinson's disease Father   . Colon cancer Father   . Cancer Maternal Aunt        colon  . Cancer Paternal Grandmother        stomach  . Diabetes Brother   . Diabetes Brother      Allergies  Allergen Reactions  . Lisinopril Swelling and Hives    Angioedema Angioedema     REVIEW OF SYSTEMS (Negative unless checked)  Constitutional: _0 ?Weight loss  _1 ?Fever  _2 ?Chills Cardiac: _3 ?Chest pain   _4 ?Chest pressure   _5 ?Palpitations   _6 ?Shortness of breath when laying flat   _7 ?Shortness of breath at rest   _8 ?Shortness of breath with exertion. Vascular:  _9 ?Pain in legs with walking   _10 ?Pain in legs at rest   _11 ?Pain in legs when laying flat   _12 ?Claudication   _13 ?Pain in feet when walking  _14 ?Pain in feet at rest  _15 ?Pain in feet when laying flat   _16 ?History of DVT   _17 ?Phlebitis   _18 ?Swelling in legs   _19 ?Varicose veins   _20 ?Non-healing ulcers Pulmonary:   _21 ?Uses home oxygen   _22 ?Productive cough   _23 ?Hemoptysis   _24 ?Wheeze  _25 ?COPD   _26 ?Asthma Neurologic:  _27 ?Dizziness  _28 ?Blackouts   _29 ?Seizures   _30 ?History of stroke   _31 ?History of TIA  _32 ?Aphasia   _33 ?Temporary blindness   _34 ?Dysphagia   _35 ?Weakness or numbness in arms   _36 ?Weakness or numbness in legs Musculoskeletal:  _37 ?Arthritis   _38 ?Joint swelling   _39 ?Joint pain   _40 ?Low back pain Hematologic:  _41 ?Easy bruising  _42 ?Easy bleeding   _43 ?Hypercoagulable state   _44 ?Anemic  _45 ?Hepatitis Gastrointestinal:  _46 ?Blood in  stool   _47 ?Vomiting blood  _48 ?Gastroesophageal reflux/heartburn   _49 ?Difficulty swallowing. Genitourinary:  _50 ?Chronic kidney disease   _51 ?Difficult urination  _52 ?Frequent urination  _53 ?Burning with urination   _54 ?Blood  in urine Skin:  _0 ?Rashes   _1 ?Ulcers   _2 ?Wounds Psychological:  _3 ?History of anxiety   _4 ? History of major depression.  Physical Examination  Vitals:   01/08/20 1437  BP: 125/69  Pulse: 80  Resp: 14  Weight: 254 lb (115.2 kg)  Height: _5  (1.727 m)   Body mass index is 38.62 kg/m. Gen:  WD/WN, NAD Head: [place]/AT, No temporalis wasting. Ear/Nose/Throat: Hearing grossly intact, nares w/o erythema or drainage, trachea midline Eyes: Conjunctiva clear. Sclera non-icteric Neck: Supple.  No bruit  Pulmonary:  Good air movement, equal and clear to auscultation bilaterally.  Cardiac: RRR, No JVD Vascular:  Vessel Right Left  Radial Palpable Palpable               Musculoskeletal: M/S 5/5 throughout.  No deformity or atrophy.  No significant lower extremity edema. Neurologic: CN 2-12 intact. Sensation grossly intact in extremities.  Symmetrical.  Speech is fluent. Motor exam as listed above. Psychiatric: Judgment intact, Mood & affect appropriate for pt's clinical situation. Dermatologic: No rashes or ulcers noted.  No cellulitis or open wounds.      CBC Lab Results  Component Value Date   WBC 6.2 09/11/2018   HGB 11.9 09/11/2018   HCT 35.8 09/11/2018   MCV 90 09/11/2018   PLT 242 09/11/2018    BMET    Component Value Date/Time   NA 144 09/11/2018 1126   K 4.3 09/11/2018 1126   CL 105 09/11/2018 1126   CO2 24 09/11/2018 1126   GLUCOSE 97 09/11/2018 1126   BUN 18 09/11/2018 1126   CREATININE 1.01 (H) 09/11/2018 1126   CALCIUM 9.5 09/11/2018 1126   GFRNONAA 60 09/11/2018 1126   GFRAA 69 09/11/2018 1126   CrCl cannot be calculated (Patient's most recent lab result is older than the maximum 21 days allowed.).  COAG No results found for: INR,  PROTIME  Radiology No results found.    Assessment/Plan Essential hypertension blood pressure control important in reducing the progression of atherosclerotic disease. On appropriate oral medications.   Diabetes mellitus with polyneuropathy blood glucose control important in reducing the progression of atherosclerotic disease. Also, involved in wound healing. On appropriate medications.  Carotid stenosis  Her carotid disease remains mild on duplex today with 1 to 39% stenosis bilaterally without significant progression from previous studies.  I think continuing to follow this every other year with duplex would be fine at this point.  No role for intervention at this level.  Continue aspirin therapy.    Leotis Pain, MD  01/08/2020 4:27 PM    This note was created with Dragon medical transcription system.  Any errors from dictation are purely unintentional

## 2020-01-08 NOTE — Assessment & Plan Note (Addendum)
Her carotid disease remains mild on duplex today with 1 to 39% stenosis bilaterally without significant progression from previous studies.  I think continuing to follow this every other year with duplex would be fine at this point.  No role for intervention at this level.  Continue aspirin therapy.

## 2020-01-08 NOTE — Telephone Encounter (Signed)
[facility] faxed refill request for the following medications:  pregabalin (LYRICA) 100 MG capsule zolpidem (AMBIEN) 5 MG tablet  LOV: 12/17/2019 Please advise. Thanks TNP

## 2020-01-13 IMAGING — MR MRI LUMBAR SPINE WITHOUT CONTRAST
5 series · 31 of 48 positions shown · non-contrast
Comparison: 04/01/1999 MRI report

CLINICAL DATA: Right hip pain for 6 months

EXAM:
MRI LUMBAR SPINE WITHOUT CONTRAST
TECHNIQUE: Multiplanar, multisequence MR imaging of the lumbar spine was
performed. No intravenous contrast was administered.

[Series 5: T2 · sagittal · 4.0mm · 0.81mm/px · 6 of 17 slices shown (1 of 2)]
[im 1/17]
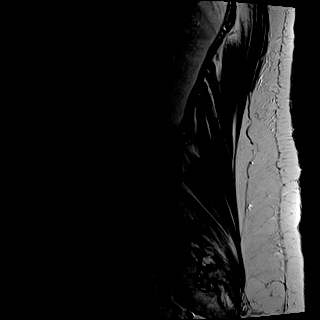
[im 4/17]
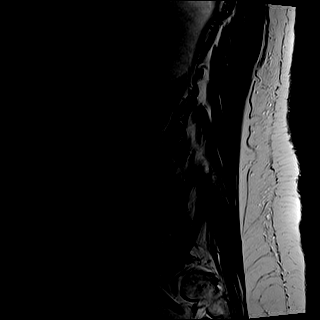
[im 7/17]
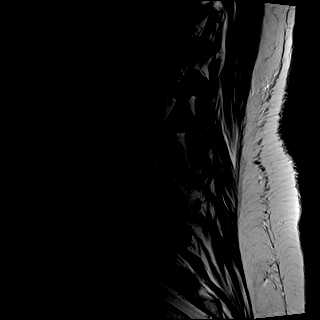
[im 10/17]
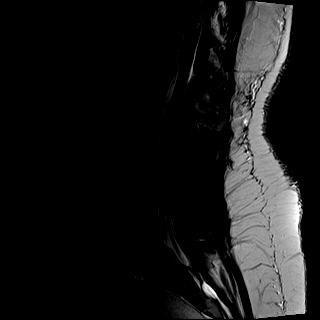
[im 13/17]
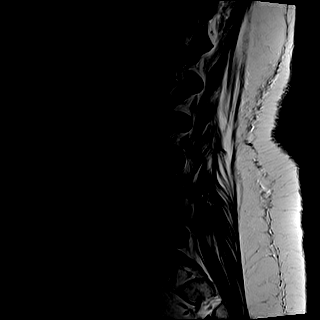
[im 17/17]
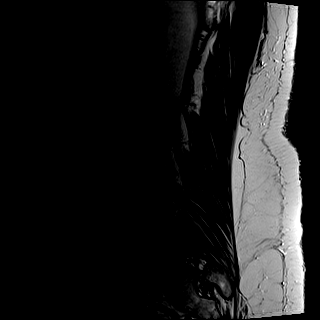

[Series 6: T1 · sagittal · 4.0mm · 0.81mm/px · 7 of 17 slices shown (1 of 2)]
[im 1/17]
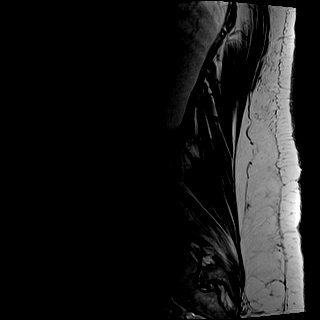
[im 3/17]
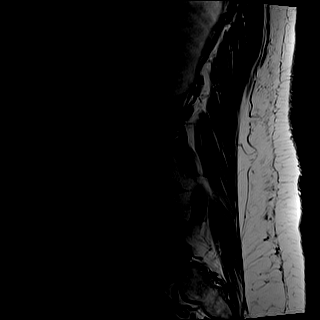
[im 6/17]
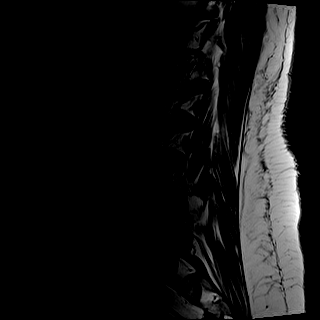
[im 9/17]
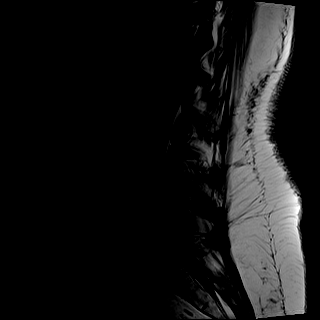
[im 11/17]
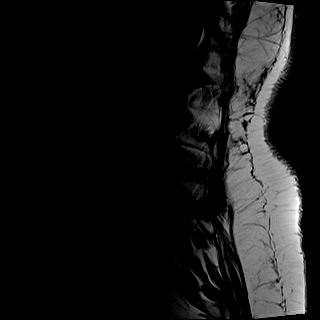
[im 14/17]
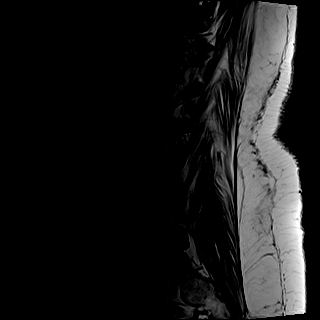
[im 17/17]
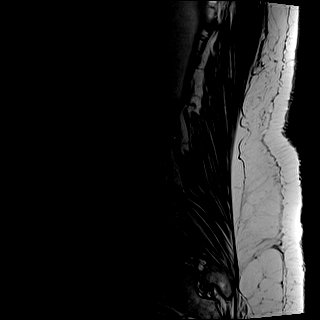

[Series 7: STIR · sagittal · 4.0mm · 0.41mm/px · 2 of 17 slices shown]
[im 1/17]
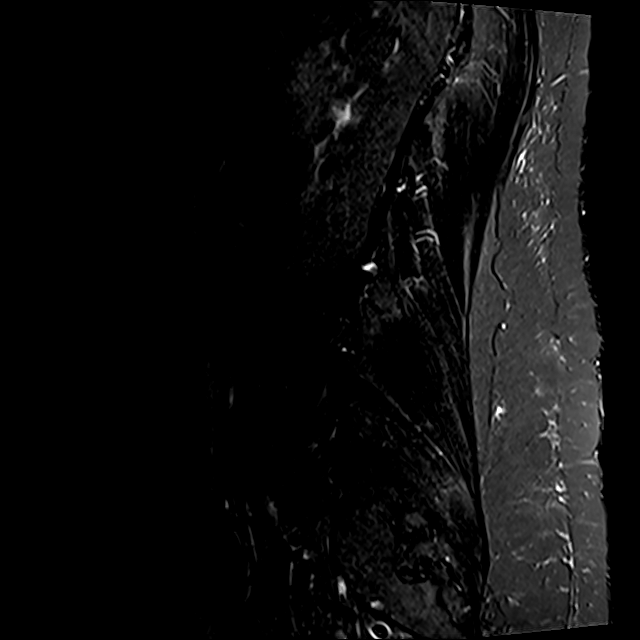
[im 3/17]
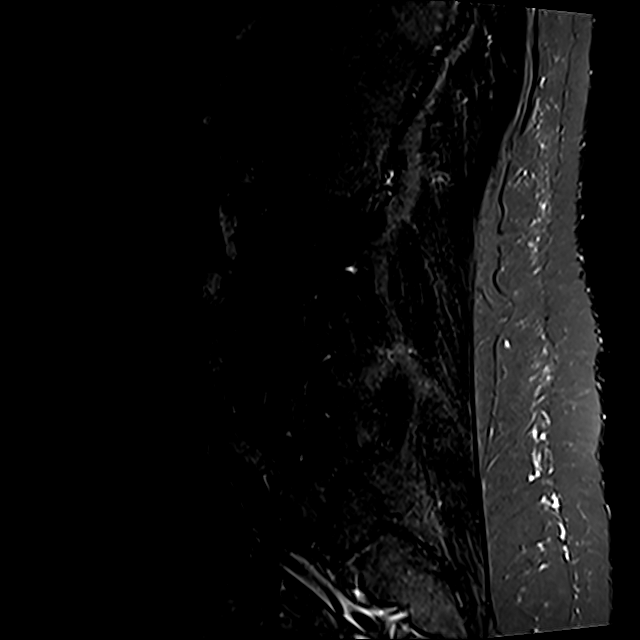

[Series 8: T2 · axial · 4.0mm · 0.78mm/px · z∈[-102,+129]mm · 8 of 37 slices shown (2 of 2)]
[im 1/37]
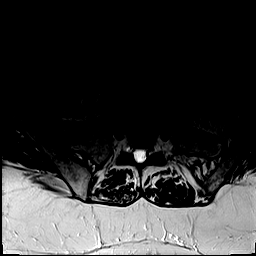
[im 6/37]
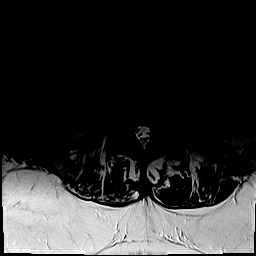
[im 12/37]
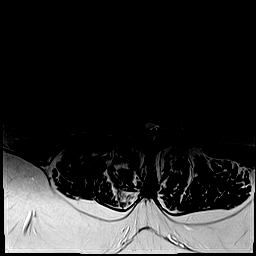
[im 17/37]
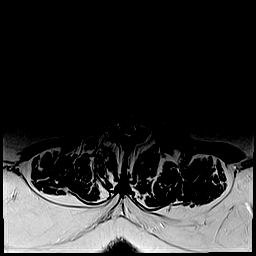
[im 20/37]
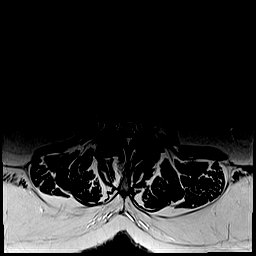
[im 25/37]
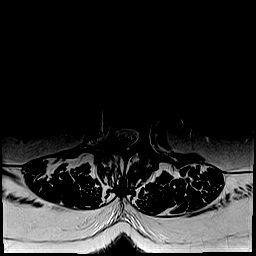
[im 31/37]
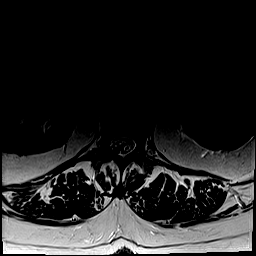
[im 37/37]
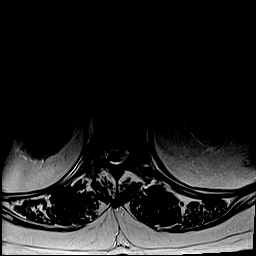

[Series 9: T1 · axial · 4.0mm · 0.39mm/px · z∈[-102,+129]mm · 8 of 37 slices shown (2 of 2)]
[im 1/37]
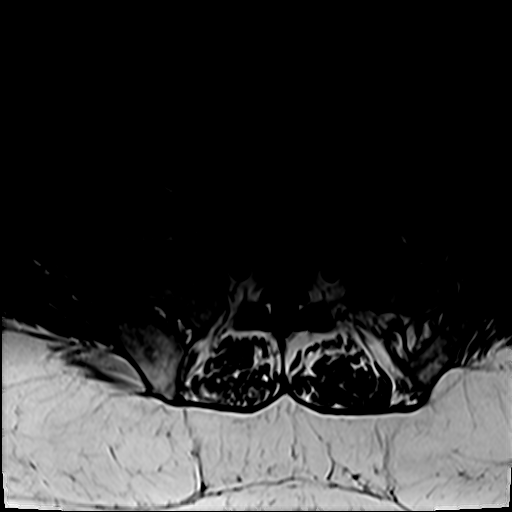
[im 6/37]
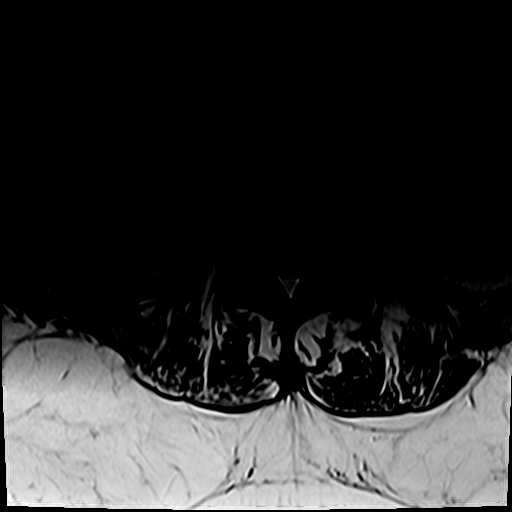
[im 12/37]
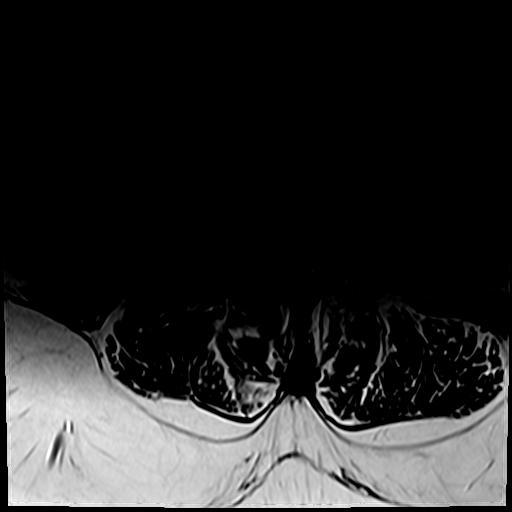
[im 17/37]
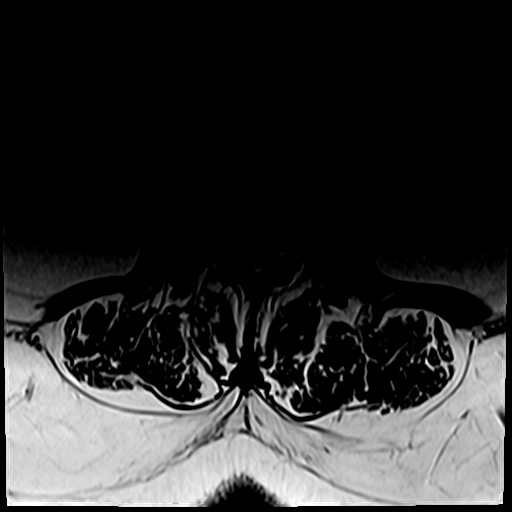
[im 20/37]
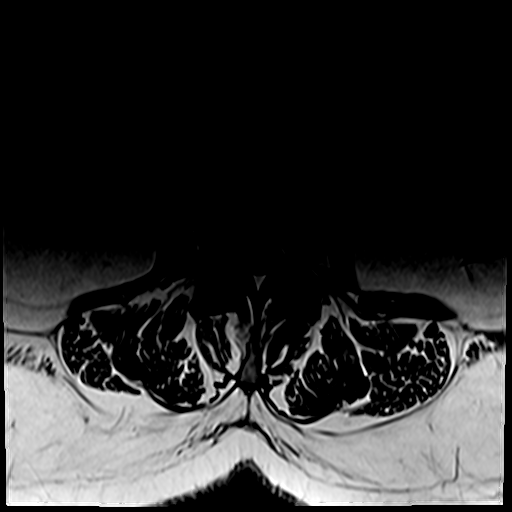
[im 25/37]
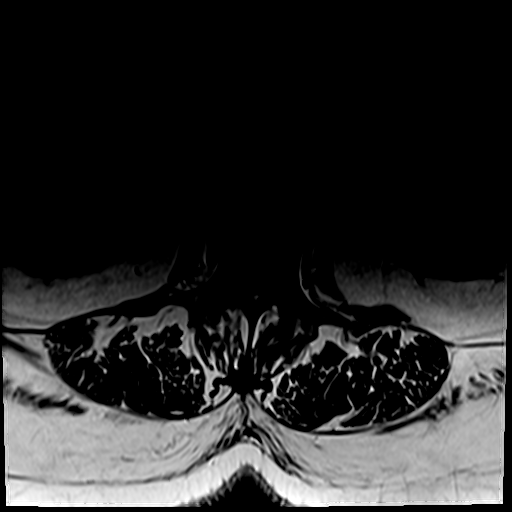
[im 31/37]
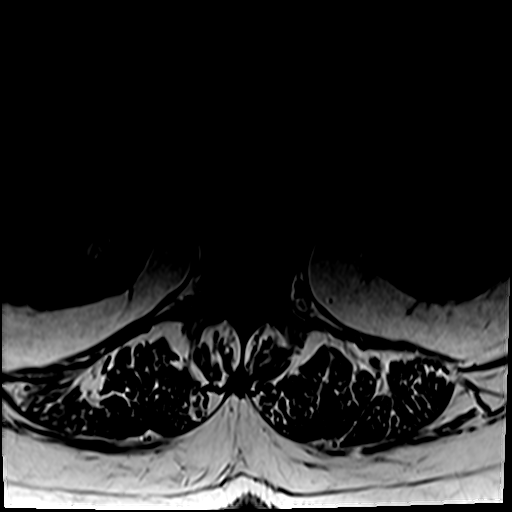
[im 37/37]
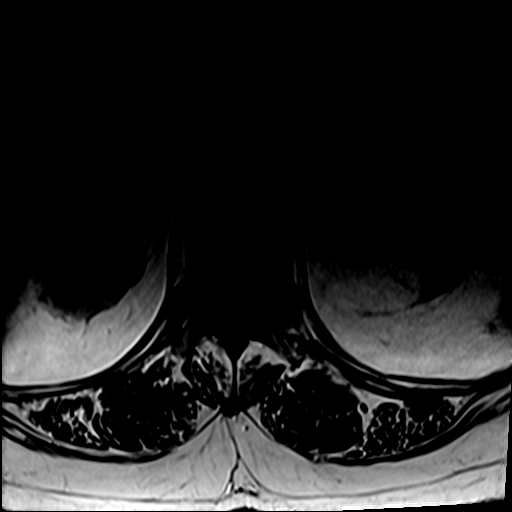

[31 of 48 positions shown; findings below may reference images not displayed]

FINDINGS: Segmentation:  Standard lumbar numbering

Alignment:  Slight anterolisthesis at L4-5, degenerative

Vertebrae:  No fracture, evidence of discitis, or bone lesion.

Conus medullaris and cauda equina: Conus extends to the L1 level.
Conus and cauda equina appear normal.

Paraspinal and other soft tissues: Negative

Disc levels:

T12- L1: Unremarkable.

L1-L2: Mild disc narrowing and facet spurring.  No impingement

L2-L3: Mild disc narrowing and annulus bulging.  No impingement

L3-L4: Mild disc narrowing and bulging. Mild facet spurring. No
impingement

L4-L5: Moderate degenerative facet spurring with mild
anterolisthesis. Mild narrowing of the disc. No impingement

L5-S1:Moderate degenerative facet spurring and mild disc bulging.
Tiny central protrusion. No impingement
IMPRESSION: 1. No impingement to explain right hip symptoms.
2. Degenerative changes are noted above.

## 2020-01-16 ENCOUNTER — Ambulatory Visit: Payer: Managed Care, Other (non HMO) | Attending: Internal Medicine

## 2020-01-16 DIAGNOSIS — Z23 Encounter for immunization: Secondary | ICD-10-CM

## 2020-01-16 NOTE — Progress Notes (Signed)
   Covid-19 Vaccination Clinic  Name:  ARIAL BUBEL    MRN: [id number] DOB: [birth year]  01/16/2020  Ms. Mckinsey was observed post Covid-19 immunization for 15 minutes without incident. She was provided with Vaccine Information Sheet and instruction to access the V-Safe system.   Ms. Lumbra was instructed to call 911 with any severe reactions post vaccine: Marland Kitchen Difficulty breathing  . Swelling of face and throat  . A fast heartbeat  . A bad rash all over body  . Dizziness and weakness   Immunizations Administered    Name Date Dose VIS Date Route   Pfizer COVID-19 Vaccine 01/16/2020  9:11 AM 0.3 mL 10/12/2019 Intramuscular   Manufacturer: [place]   Lot: [id number]   [place]: [id number]

## 2020-01-25 ENCOUNTER — Other Ambulatory Visit: Payer: Self-pay | Admitting: Family Medicine

## 2020-01-25 DIAGNOSIS — E1142 Type 2 diabetes mellitus with diabetic polyneuropathy: Secondary | ICD-10-CM

## 2020-02-09 LAB — CBC WITH DIFFERENTIAL/PLATELET
Basophils Absolute: 0 10*3/uL (ref 0.0–0.2)
Basos: 0 %
EOS (ABSOLUTE): 0.1 10*3/uL (ref 0.0–0.4)
Eos: 2 %
Hematocrit: 35.8 % (ref 34.0–46.6)
Hemoglobin: 12.1 g/dL (ref 11.1–15.9)
Immature Grans (Abs): 0 10*3/uL (ref 0.0–0.1)
Immature Granulocytes: 0 %
Lymphocytes Absolute: 2.1 10*3/uL (ref 0.7–3.1)
Lymphs: 34 %
MCH: 30.3 pg (ref 26.6–33.0)
MCHC: 33.8 g/dL (ref 31.5–35.7)
MCV: 90 fL (ref 79–97)
Monocytes Absolute: 0.5 10*3/uL (ref 0.1–0.9)
Monocytes: 8 %
Neutrophils Absolute: 3.4 10*3/uL (ref 1.4–7.0)
Neutrophils: 56 %
Platelets: 247 10*3/uL (ref 150–450)
RBC: 4 x10E6/uL (ref 3.77–5.28)
RDW: 13.1 % (ref 11.7–15.4)
WBC: 6.1 10*3/uL (ref 3.4–10.8)

## 2020-02-09 LAB — COMPREHENSIVE METABOLIC PANEL
ALT: 15 IU/L (ref 0–32)
AST: 16 IU/L (ref 0–40)
Albumin/Globulin Ratio: 2 (ref 1.2–2.2)
Albumin: 4.2 g/dL (ref 3.8–4.8)
Alkaline Phosphatase: 80 IU/L (ref 39–117)
BUN/Creatinine Ratio: 15 (ref 12–28)
BUN: 15 mg/dL (ref 8–27)
Bilirubin Total: 0.5 mg/dL (ref 0.0–1.2)
CO2: 23 mmol/L (ref 20–29)
Calcium: 10.1 mg/dL (ref 8.7–10.3)
Chloride: 101 mmol/L (ref 96–106)
Creatinine, Ser: 0.98 mg/dL (ref 0.57–1.00)
GFR calc Af Amer: 71 mL/min/{1.73_m2} (ref >59)
GFR calc non Af Amer: 61 mL/min/{1.73_m2} (ref >59)
Globulin, Total: 2.1 g/dL (ref 1.5–4.5)
Glucose: 112 mg/dL — ABNORMAL HIGH (ref 65–99)
Potassium: 4.6 mmol/L (ref 3.5–5.2)
Sodium: 141 mmol/L (ref 134–144)
Total Protein: 6.3 g/dL (ref 6.0–8.5)

## 2020-02-09 LAB — LIPID PANEL WITH LDL/HDL RATIO
Cholesterol, Total: 159 mg/dL (ref 100–199)
HDL: 53 mg/dL (ref >39)
LDL Chol Calc (NIH): 81 mg/dL (ref 0–99)
LDL/HDL Ratio: 1.5 ratio (ref 0.0–3.2)
Triglycerides: 144 mg/dL (ref 0–149)
VLDL Cholesterol Cal: 25 mg/dL (ref 5–40)

## 2020-02-09 LAB — TSH: TSH: 4.95 u[IU]/mL — ABNORMAL HIGH (ref 0.450–4.500)

## 2020-02-09 LAB — HEMOGLOBIN A1C
Est. average glucose Bld gHb Est-mCnc: 123 mg/dL
Hgb A1c MFr Bld: 5.9 % — ABNORMAL HIGH (ref 4.8–5.6)

## 2020-02-11 ENCOUNTER — Telehealth: Payer: Self-pay | Admitting: *Deleted

## 2020-02-11 ENCOUNTER — Telehealth: Payer: Self-pay

## 2020-02-11 NOTE — Telephone Encounter (Signed)
-----   Message from Margo Common, [place] sent at 02/11/2020  2:16 PM EDT ----- Blood tests essentially normal with good blood sugar and Hgb A1C 5.9. Continue present diet, exercise and medication. Recheck diabetes appointment in 4 months.

## 2020-02-11 NOTE — Telephone Encounter (Signed)
Reviewed lab results and provider's note with the patient. Scheduled 4 month follow up appointment per note, for August 12.

## 2020-02-11 NOTE — Telephone Encounter (Signed)
Results given in 2nd telephone encounter on 02/11/20.

## 2020-02-11 NOTE — Telephone Encounter (Signed)
LMTCB, [facility] Triage Nurse may give patient results  

## 2020-03-17 ENCOUNTER — Other Ambulatory Visit: Payer: Self-pay | Admitting: Family Medicine

## 2020-03-17 DIAGNOSIS — G4700 Insomnia, unspecified: Secondary | ICD-10-CM

## 2020-03-17 DIAGNOSIS — I10 Essential (primary) hypertension: Secondary | ICD-10-CM

## 2020-03-17 DIAGNOSIS — E1142 Type 2 diabetes mellitus with diabetic polyneuropathy: Secondary | ICD-10-CM

## 2020-03-17 NOTE — Telephone Encounter (Signed)
Requested medication (s) are due for refill today: Yes  Requested medication (s) are on the active medication list: Yes  Last refill:  Ativan 01/07/20  Lyrica 01/08/20  Ambien 01/08/20  Future visit scheduled: Yes  Notes to clinic:  See requests.    Requested Prescriptions  Pending Prescriptions Disp Refills   LORazepam (ATIVAN) 2 MG tablet [Pharmacy Med Name: LORAZEPAM  2MG   TAB] 90 tablet     Sig: TAKE 1 TABLET BY MOUTH AT  BEDTIME AS NEEDED      Not Delegated - Psychiatry:  Anxiolytics/Hypnotics Failed - 03/17/2020  1:29 PM      Failed - This refill cannot be delegated      Failed - Urine Drug Screen completed in last 360 days.      Failed - Valid encounter within last 6 months    Recent Outpatient Visits           3 months ago Annual physical exam   [place], Vickki Muff, [place]   10 months ago Urinary tract infection without hematuria, site unspecified   [facility], Vickki Muff, [place]   1 year ago Essential hypertension   [facility], Vickki Muff, [place]   1 year ago Cystitis with hematuria   [place], [place]   2 years ago Annual physical exam   [facility], Vickki Muff, [place]       Future Appointments             In 2 months [place], Vickki Muff, [place], [facility]              pregabalin (LYRICA) 100 MG capsule [employer] Med Name: PREGABALIN  100MG   CAP] 270 capsule     Sig: TAKE 1 CAPSULE BY MOUTH 3  TIMES DAILY      Not Delegated - Neurology:  Anticonvulsants - Controlled Failed - 03/17/2020  1:29 PM      Failed - This refill cannot be delegated      Passed - Valid encounter within last 12 months    Recent Outpatient Visits           3 months ago Annual physical exam   [place], [place]   10 months ago Urinary tract infection without hematuria, site unspecified   [facility],  Vickki Muff, [place]   1 year ago Essential hypertension   [facility], Vickki Muff, [place]   1 year ago Cystitis with hematuria   [facility] [place], [place]   2 years ago Annual physical exam   [facility], Vickki Muff, [place]       Future Appointments             In 2 months Chr[facility], Vickki Muff, [place], [facility]              zolpidem (AMBIEN) 5 MG tablet [employer] Med Name: ZOLPIDEM  5MG   TAB] 30 tablet     Sig: TAKE 1 TABLET BY MOUTH AT  BEDTIME AS NEEDED      Not Delegated - Psychiatry:  Anxiolytics/Hypnotics Failed - 03/17/2020  1:29 PM      Failed - This refill cannot be delegated      Failed - Urine Drug Screen completed in last 360 days.      Failed - Valid encounter within last 6 months    Recent Outpatient Visits  3 months ago Annual physical exam   [place], [place]   10 months ago Urinary tract infection without hematuria, site unspecified   [facility], Vickki Muff, [place]   1 year ago Essential hypertension   [facility], Vickki Muff, [place]   1 year ago Cystitis with hematuria   [place], [place]   2 years ago Annual physical exam   [facility], Vickki Muff, [place]       Future Appointments             In 2 months [place], Vickki Muff, [place], [facility]             Signed Prescriptions Disp Refills   pioglitazone (ACTOS) 45 MG tablet 60 tablet 0    Sig: TAKE 1 TABLET BY MOUTH AT  BEDTIME      Endocrinology:  Diabetes - Glitazones - pioglitazone Failed - 03/17/2020  1:29 PM      Failed - Valid encounter within last 6 months    Recent Outpatient Visits           3 months ago Annual physical exam   [place], [place]   10 months ago Urinary tract infection without hematuria, site unspecified   [facility], [place], [place]   1 year ago Essential hypertension   [facility], Vickki Muff, [place]   1 year ago Cystitis with hematuria   [place], [place], [place]   2 years ago Annual physical exam   [facility], Vickki Muff, [place]       Future Appointments             In 2 months [facility], Vickki Muff, [place], [place] - HBA1C is between 0 and 7.9 and within 180 days    Hgb A1c MFr Bld  Date Value Ref Range Status  02/08/2020 5.9 (H) 4.8 - 5.6 % Final    Comment:             Prediabetes: 5.7 - 6.4          Diabetes: >6.4          Glycemic control for adults with diabetes: <7.0             glipiZIDE (GLUCOTROL XL) 2.5 MG 24 hr tablet 90 tablet 0    Sig: TAKE 1 TABLET BY MOUTH  DAILY WITH BREAKFAST      Endocrinology:  Diabetes - Sulfonylureas Failed - 03/17/2020  1:29 PM      Failed - Valid encounter within last 6 months    Recent Outpatient Visits           3 months ago Annual physical exam   [place], [place]   10 months ago Urinary tract infection without hematuria, site unspecified   [facility], Vickki Muff, [place]   1 year ago Essential hypertension   [facility], Vickki Muff, [place]   1 year ago Cystitis with hematuria   [place], [place]   2 years ago Annual physical exam   [facility], Vickki Muff, [place]       Future Appointments             In 2 months [place],  Vickki M[place], [place] [facility], [facility]            Passed - HBA1C is between 0 and 7.9 and within 180 days    Hgb A1c MFr Bld  Date Value Ref Range Status  02/08/2020 5.9 (H) 4.8 - 5.6 % Final    Comment:             Prediabetes: 5.7 - 6.4          Diabetes: >6.4          Glycemic control for adults with diabetes: <7.0             hydrochlorothiazide (MICROZIDE) 12.5  MG capsule 90 capsule 0    Sig: TAKE 1 CAPSULE BY MOUTH  DAILY      Cardiovascular: Diuretics - Thiazide Failed - 03/17/2020  1:29 PM      Failed - Valid encounter within last 6 months    Recent Outpatient Visits           3 months ago Annual physical exam   [facility], [place], [place]   10 months ago Urinary tract infection without hematuria, site unspecified   [facility], [place], [place]   1 year ago Essential hypertension   [facility], [place], [place]   1 year ago Cystitis with hematuria   [place], [place], [place]   2 years ago Annual physical exam   [facility], Vickki Muff, [place]       Future Appointments             In 2 months [address], Vickki Muff, [place], [place] in normal range and within 360 days    Calcium  Date Value Ref Range Status  02/08/2020 10.1 8.7 - 10.3 mg/dL Final          Passed - Cr in normal range and within 360 days    Creatinine, Ser  Date Value Ref Range Status  02/08/2020 0.98 0.57 - 1.00 mg/dL Final          Passed - K in normal range and within 360 days    Potassium  Date Value Ref Range Status  02/08/2020 4.6 3.5 - 5.2 mmol/L Final          Passed - Na in normal range and within 360 days    Sodium  Date Value Ref Range Status  02/08/2020 141 134 - 144 mmol/L Final          Passed - Last BP in normal range    BP Readings from Last 1 Encounters:  01/08/20 125/69            losartan (COZAAR) 50 MG tablet 90 tablet 0    Sig: TAKE 1 TABLET BY MOUTH  DAILY      Cardiovascular:  Angiotensin Receptor Blockers Failed - 03/17/2020  1:29 PM      Failed - Valid encounter within last 6 months    Recent Outpatient Visits           3 months ago Annual physical exam   [facility], [place], [place]   10 months ago Urinary tract infection without hematuria, site  unspecified   [facility], Vickki Muff, [place]   1 year ago Essential hypertension   [facility], [place], [place]  1 year ago Cystitis with hematuria   [place], [place]   2 years ago Annual physical exam   [place], [place]       Future Appointments             In 2 months [place], Vickki Muff, [place], [place] in normal range and within 180 days    Creatinine, Ser  Date Value Ref Range Status  02/08/2020 0.98 0.57 - 1.00 mg/dL Final          Passed - K in normal range and within 180 days    Potassium  Date Value Ref Range Status  02/08/2020 4.6 3.5 - 5.2 mmol/L Final          Passed - Patient is not pregnant      Passed - Last BP in normal range    BP Readings from Last 1 Encounters:  01/08/20 125/69

## 2020-03-17 NOTE — Telephone Encounter (Signed)
Prescription expires in 60 days.

## 2020-04-03 ENCOUNTER — Telehealth: Payer: Self-pay | Admitting: Family Medicine

## 2020-04-03 NOTE — Telephone Encounter (Signed)
[facility] faxed refill request for the following medications:  pioglitazone (ACTOS) 45 MG tablet [id number]  Please advise.  Thanks, [employer]

## 2020-04-04 ENCOUNTER — Other Ambulatory Visit: Payer: Self-pay

## 2020-04-04 DIAGNOSIS — E1142 Type 2 diabetes mellitus with diabetic polyneuropathy: Secondary | ICD-10-CM

## 2020-04-04 MED ORDER — PIOGLITAZONE HCL 45 MG PO TABS: 45.0000 mg | ORAL_TABLET | Freq: Every day | ORAL | 0 refills | Status: DC

## 2020-04-07 LAB — HM DIABETES EYE EXAM

## 2020-04-11 ENCOUNTER — Other Ambulatory Visit: Payer: Self-pay | Admitting: Family Medicine

## 2020-04-11 DIAGNOSIS — G4700 Insomnia, unspecified: Secondary | ICD-10-CM

## 2020-04-11 DIAGNOSIS — E1142 Type 2 diabetes mellitus with diabetic polyneuropathy: Secondary | ICD-10-CM

## 2020-04-11 NOTE — Telephone Encounter (Signed)
Requested medication (s) are due for refill today: yes  Requested medication (s) are on the active medication list: yes  Last refill:  03/18/20  Future visit scheduled: yes  Notes to clinic:  not delegated     Requested Prescriptions  Pending Prescriptions Disp Refills   zolpidem (AMBIEN) 5 MG tablet [Pharmacy Med Name: ZOLPIDEM  5MG   TAB] 30 tablet     Sig: TAKE 1 TABLET BY MOUTH AT  BEDTIME AS NEEDED      Not Delegated - Psychiatry:  Anxiolytics/Hypnotics Failed - 04/11/2020 11:53 AM      Failed - This refill cannot be delegated      Failed - Urine Drug Screen completed in last 360 days.      Failed - Valid encounter within last 6 months    Recent Outpatient Visits           3 months ago Annual physical exam   [place], Vickki Muff, [place]   11 months ago Urinary tract infection without hematuria, site unspecified   [facility], Vickki Muff, [place]   1 year ago Essential hypertension   [facility], Vickki Muff, [place]   1 year ago Cystitis with hematuria   [place], [place]   2 years ago Annual physical exam   [facility], Vickki Muff, [place]       Future Appointments             In 2 months [place], Vickki Muff, [place], [facility]             Signed Prescriptions Disp Refills   pioglitazone (ACTOS) 45 MG tablet 60 tablet 5    Sig: TAKE 1 TABLET BY MOUTH AT  BEDTIME      Endocrinology:  Diabetes - Glitazones - pioglitazone Failed - 04/11/2020 11:53 AM      Failed - Valid encounter within last 6 months    Recent Outpatient Visits           3 months ago Annual physical exam   [place], [place]   11 months ago Urinary tract infection without hematuria, site unspecified   [facility], Vickki Muff, [place]   1 year ago Essential hypertension   [facility], Vickki Muff, [place]   1  year ago Cystitis with hematuria   [place], [place], [place]   2 years ago Annual physical exam   [facility], Vickki Muff, [place]       Future Appointments             In 2 months [place], Vickki Muff, [place], [facility]            Passed - HBA1C is between 0 and 7.9 and within 180 days    Hgb A1c MFr Bld  Date Value Ref Range Status  02/08/2020 5.9 (H) 4.8 - 5.6 % Final    Comment:             Prediabetes: 5.7 - 6.4          Diabetes: >6.4          Glycemic control for adults with diabetes: <7.0

## 2020-04-21 ENCOUNTER — Other Ambulatory Visit: Payer: Self-pay | Admitting: Family Medicine

## 2020-04-21 DIAGNOSIS — G4700 Insomnia, unspecified: Secondary | ICD-10-CM

## 2020-04-21 DIAGNOSIS — I10 Essential (primary) hypertension: Secondary | ICD-10-CM

## 2020-04-21 DIAGNOSIS — E1142 Type 2 diabetes mellitus with diabetic polyneuropathy: Secondary | ICD-10-CM

## 2020-04-21 NOTE — Telephone Encounter (Signed)
Requested medication (s) are due for refill today: Yes  Requested medication (s) are on the active medication list: Yes  Last refill:  04/11/20 and 01/07/20  Future visit scheduled:Yes  Notes to clinic: Pharmacy requesting a 3 month supply, unable to refill due to cannot delegate.     Requested Prescriptions  Pending Prescriptions Disp Refills   LORazepam (ATIVAN) 2 MG tablet [Pharmacy Med Name: LORAZEPAM  2MG  TAB] 90 tablet     Sig: TAKE 1 TABLET BY MOUTH AT  BEDTIME AS NEEDED      Not Delegated - Psychiatry:  Anxiolytics/Hypnotics Failed - 04/21/2020  4:55 PM      Failed - This refill cannot be delegated      Failed - Urine Drug Screen completed in last 360 days.      Passed - Valid encounter within last 6 months    Recent Outpatient Visits           4 months ago Annual physical exam   [place], Vickki Muff, [place]   11 months ago Urinary tract infection without hematuria, site unspecified   [facility], Vickki Muff, [place]   1 year ago Essential hypertension   [facility], Vickki Muff, [place]   1 year ago Cystitis with hematuria   [place], [place]   2 years ago Annual physical exam   [facility], Vickki Muff, [place]       Future Appointments             In 1 month [place], Vickki Muff, [place], [facility]              zolpidem (AMBIEN) 5 MG tablet [employer] Med Name: ZOLPIDEM  5MG  TAB] 30 tablet     Sig: TAKE 1 TABLET BY MOUTH AT  BEDTIME AS NEEDED      Not Delegated - Psychiatry:  Anxiolytics/Hypnotics Failed - 04/21/2020  4:55 PM      Failed - This refill cannot be delegated      Failed - Urine Drug Screen completed in last 360 days.      Passed - Valid encounter within last 6 months    Recent Outpatient Visits           4 months ago Annual physical exam   [place], Vickki Muff, [place]   11 months ago Urinary tract  infection without hematuria, site unspecified   [facility], Vickki Muff, [place]   1 year ago Essential hypertension   [facility], Vickki Muff, [place]   1 year ago Cystitis with hematuria   [place], [place]   2 years ago Annual physical exam   [place], [place]       Future Appointments             In 1 month [place], [place]ickki [place], [place] [facility], [facility]             Signed Prescriptions Disp Refills   glipiZIDE (GLUCOTROL XL) 2.5 MG 24 hr tablet 90 tablet 2    Sig: TAKE 1 TABLET BY MOUTH  DAILY WITH BREAKFAST      Endocrinology:  Diabetes - Sulfonylureas Passed - 04/21/2020  4:55 PM      Passed - HBA1C is between 0 and 7.9 and within 180 days    Hgb A1c MFr Bld  Date Value Ref Range  Status  02/08/2020 5.9 (H) 4.8 - 5.6 % Final    Comment:             Prediabetes: 5.7 - 6.4          Diabetes: >6.4          Glycemic control for adults with diabetes: <7.0           Passed - Valid encounter within last 6 months    Recent Outpatient Visits           4 months ago Annual physical exam   [place], [place]   11 months ago Urinary tract infection without hematuria, site unspecified   [facility], [place], [place]   1 year ago Essential hypertension   [facility], [place], [place]   1 year ago Cystitis with hematuria   [place], [place]   2 years ago Annual physical exam   [facility], Vickki Muff, [place]       Future Appointments             In 1 month Chrism[place], Vickki Muff, [employer], [facility]              hydrochlorothiazide (MICROZIDE) 12.5 MG capsule 90 capsule 2    Sig: TAKE 1 CAPSULE BY MOUTH  DAILY      Cardiovascular: Diuretics - Thiazide Passed - 04/21/2020  4:55 PM      Passed - Ca in normal range and within 360 days     Calcium  Date Value Ref Range Status  02/08/2020 10.1 8.7 - 10.3 mg/dL Final          Passed - Cr in normal range and within 360 days    Creatinine, Ser  Date Value Ref Range Status  02/08/2020 0.98 0.57 - 1.00 mg/dL Final          Passed - K in normal range and within 360 days    Potassium  Date Value Ref Range Status  02/08/2020 4.6 3.5 - 5.2 mmol/L Final          Passed - Na in normal range and within 360 days    Sodium  Date Value Ref Range Status  02/08/2020 141 134 - 144 mmol/L Final          Passed - Last BP in normal range    BP Readings from Last 1 Encounters:  01/08/20 125/69          Passed - Valid encounter within last 6 months    Recent Outpatient Visits           4 months ago Annual physical exam   [facility], [place], [place]   11 months ago Urinary tract infection without hematuria, site unspecified   [facility], Vickki Muff, [place]   1 year ago Essential hypertension   [facility], Vickki Muff, [place]   1 year ago Cystitis with hematuria   [facility] [place], [place]   2 years ago Annual physical exam   [place], [place]       Future Appointments             In 1 month [place], [place], [place] [employer] [employer], [facility]              JANUMET 50-1000 MG tablet 180 tablet 2  Sig: TAKE 1 TABLET BY MOUTH  TWICE DAILY      Endocrinology:  Diabetes - Biguanide + DPP-4 Inhibitor Combos Passed - 04/21/2020  4:55 PM      Passed - HBA1C is between 0 and 7.9 and within 180 days    Hgb A1c MFr Bld  Date Value Ref Range Status  02/08/2020 5.9 (H) 4.8 - 5.6 % Final    Comment:             Prediabetes: 5.7 - 6.4          Diabetes: >6.4          Glycemic control for adults with diabetes: <7.0           Passed - Cr in normal range and within 360 days    Creatinine, Ser  Date Value Ref Range Status  02/08/2020 0.98 0.57 - 1.00  mg/dL Final          Passed - eGFR in normal range and within 360 days    GFR calc Af Amer  Date Value Ref Range Status  02/08/2020 71 >59 mL/min/1.73 Final   GFR calc non Af Amer  Date Value Ref Range Status  02/08/2020 61 >59 mL/min/1.73 Final          Passed - Valid encounter within last 6 months    Recent Outpatient Visits           4 months ago Annual physical exam   [facility], [place], [place]   11 months ago Urinary tract infection without hematuria, site unspecified   [facility], [place], [place]   1 year ago Essential hypertension   [facility], [place], [place]   1 year ago Cystitis with hematuria   [place], [place], [place]   2 years ago Annual physical exam   [facility], Vickki Muff, [place]       Future Appointments             In 1 month [place], Vickki Muff, [place], [facility]              losartan (COZAAR) 50 MG tablet 90 tablet 2    Sig: TAKE 1 TABLET BY MOUTH  DAILY      Cardiovascular:  Angiotensin Receptor Blockers Passed - 04/21/2020  4:55 PM      Passed - Cr in normal range and within 180 days    Creatinine, Ser  Date Value Ref Range Status  02/08/2020 0.98 0.57 - 1.00 mg/dL Final          Passed - K in normal range and within 180 days    Potassium  Date Value Ref Range Status  02/08/2020 4.6 3.5 - 5.2 mmol/L Final          Passed - Patient is not pregnant      Passed - Last BP in normal range    BP Readings from Last 1 Encounters:  01/08/20 125/69          Passed - Valid encounter within last 6 months    Recent Outpatient Visits           4 months ago Annual physical exam   [facility], [place], [place]   11 months ago Urinary tract infection without hematuria, site unspecified   [facility], Vickki Muff, [place]   1 year ago Essential hypertension   [facility] [place], [place], [place]   1 year ago Cystitis with hematuria   [place], [place]   2 years ago Annual physical exam   [place], [place]       Future Appointments             In 1 month [place], Vickki Muff, [place], [facility]

## 2020-05-09 ENCOUNTER — Encounter: Payer: Self-pay | Admitting: Podiatry

## 2020-05-09 ENCOUNTER — Ambulatory Visit (INDEPENDENT_AMBULATORY_CARE_PROVIDER_SITE_OTHER): Payer: Managed Care, Other (non HMO)

## 2020-05-09 ENCOUNTER — Ambulatory Visit: Payer: Managed Care, Other (non HMO) | Admitting: Podiatry

## 2020-05-09 ENCOUNTER — Other Ambulatory Visit: Payer: Self-pay

## 2020-05-09 DIAGNOSIS — E0843 Diabetes mellitus due to underlying condition with diabetic autonomic (poly)neuropathy: Secondary | ICD-10-CM

## 2020-05-09 DIAGNOSIS — M722 Plantar fascial fibromatosis: Secondary | ICD-10-CM

## 2020-05-09 DIAGNOSIS — L989 Disorder of the skin and subcutaneous tissue, unspecified: Secondary | ICD-10-CM | POA: Diagnosis not present

## 2020-05-09 NOTE — Progress Notes (Signed)
   Subjective: 64 y.o. female PMHx diabetes mellitus type 2 presenting to the office today with a chief complaint of stabbing, shooting pain of the left plantar foot secondary to lesions that have persistently recurred to the bilateral feet.  Walking increases the pain. She has tried using corn pads for treatment with no significant relief.  Patient does have a history of diabetes mellitus and is concerned to self treat at home or go to a pedicure salon.  Patient is here for further evaluation and treatment.   Past Medical History:  Diagnosis Date  . Anemia   . Angioedema   . Arthritis   . Cancer ([place])   . Constipation   . Diabetes mellitus    Patient takes Janumet and Actos.  . Fatigue   . Fibromuscular dysplasia ([place])   . Generalized headaches   . GERD (gastroesophageal reflux disease)   . Hemorrhoids   . Hiatal hernia   . Hypertension   . Neuropathy   . Palpitations   . Rectal bleeding   . Rectal pain      Objective:  Physical Exam General: Alert and oriented x3 in no acute distress  Dermatology: Hyperkeratotic lesion(s) present on the bilateral feet x 3. Pain on palpation with a central nucleated core noted. Skin is warm, dry and supple bilateral lower extremities. Negative for open lesions or macerations.  Vascular: Palpable pedal pulses bilaterally. No edema or erythema noted. Capillary refill within normal limits.  Neurological: Epicritic and protective threshold diminished bilaterally.   Musculoskeletal Exam: Pain on palpation at the keratotic lesion(s) noted. Range of motion within normal limits bilateral. Muscle strength 5/5 in all groups bilateral.  Assessment: 1. Porokeratosis bilateral x 4 2.  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 Patient evaluated 2. Excisional debridement of keratoic lesion using a chisel blade was performed without incident. Salinocaine applied.  3. Dressed area with light dressing. 4.  Recommend OTC  Revitaderms Urea 40% lotion daily available on [employer] 5.  patient is to return to the clinic PRN.   Edrick Kins, DPM [facility]  Dr. Edrick Kins, [place]                                        [place], [place] [address]                Office [phone]  Fax [phone]

## 2020-05-12 ENCOUNTER — Other Ambulatory Visit: Payer: Self-pay | Admitting: Family Medicine

## 2020-05-12 ENCOUNTER — Other Ambulatory Visit: Payer: Self-pay | Admitting: Podiatry

## 2020-05-12 DIAGNOSIS — L989 Disorder of the skin and subcutaneous tissue, unspecified: Secondary | ICD-10-CM

## 2020-05-12 DIAGNOSIS — G4700 Insomnia, unspecified: Secondary | ICD-10-CM

## 2020-05-13 MED ORDER — ZOLPIDEM TARTRATE 5 MG PO TABS: 5.0000 mg | ORAL_TABLET | Freq: Every evening | ORAL | 0 refills | Status: DC | PRN

## 2020-05-13 NOTE — Telephone Encounter (Signed)
Please advise 

## 2020-06-12 ENCOUNTER — Ambulatory Visit: Payer: Managed Care, Other (non HMO) | Admitting: Family Medicine

## 2020-06-25 ENCOUNTER — Other Ambulatory Visit: Payer: Self-pay | Admitting: Family Medicine

## 2020-06-25 DIAGNOSIS — G4700 Insomnia, unspecified: Secondary | ICD-10-CM

## 2020-06-25 NOTE — Telephone Encounter (Signed)
Requested medication (s) are due for refill today: yes  Requested medication (s) are on the active medication list: yes  Last refill:  05/13/2020  Future visit scheduled:yes   Notes to clinic: this refill cannot be delegated    Requested Prescriptions  Pending Prescriptions Disp Refills   LORazepam (ATIVAN) 2 MG tablet [Pharmacy Med Name: LORAZEPAM  2MG   TAB] 90 tablet     Sig: TAKE 1 TABLET BY MOUTH AT  BEDTIME AS NEEDED      Not Delegated - Psychiatry:  Anxiolytics/Hypnotics Failed - 06/25/2020 12:48 PM      Failed - This refill cannot be delegated      Failed - Urine Drug Screen completed in last 360 days.      Failed - Valid encounter within last 6 months    Recent Outpatient Visits           6 months ago Annual physical exam   [place], [place]   1 year ago Urinary tract infection without hematuria, site unspecified   [facility], Vickki Muff, [place]   1 year ago Essential hypertension   [facility], Vickki Muff, [place]   2 years ago Cystitis with hematuria   [place], [place]   2 years ago Annual physical exam   [place], [place]       Future Appointments             In 2 weeks [place], Vickki Muff, [place], [facility]              zolpidem (AMBIEN) 5 MG tablet [employer] Med Name: ZOLPIDEM  5MG   TAB] 30 tablet     Sig: TAKE 1 TABLET BY MOUTH AT  BEDTIME AS NEEDED      Not Delegated - Psychiatry:  Anxiolytics/Hypnotics Failed - 06/25/2020 12:48 PM      Failed - This refill cannot be delegated      Failed - Urine Drug Screen completed in last 360 days.      Failed - Valid encounter within last 6 months    Recent Outpatient Visits           6 months ago Annual physical exam   [place], [place]   1 year ago Urinary tract infection without hematuria, site unspecified   [facility], Vickki Muff, [place]   1 year ago Essential hypertension   [facility], Vickki Muff, [place]   2 years ago Cystitis with hematuria   [place], [place]   2 years ago Annual physical exam   [place], [place]       Future Appointments             In 2 weeks [place], Vickki Muff, [place], [place]

## 2020-06-26 NOTE — Telephone Encounter (Signed)
Do you know her?--it lists both as sleeping pills . Let me know--thx

## 2020-06-26 NOTE — Telephone Encounter (Signed)
Prescription is sent but she needs to see dentist for further refills in a month or 2.

## 2020-06-26 NOTE — Telephone Encounter (Signed)
No I do not, however, it looks like she should have Lorazepam to last until 9/22

## 2020-07-10 ENCOUNTER — Ambulatory Visit: Payer: Managed Care, Other (non HMO) | Admitting: Family Medicine

## 2020-07-10 ENCOUNTER — Telehealth: Payer: Self-pay

## 2020-07-10 NOTE — Telephone Encounter (Signed)
Copied from [place] [id number]. Topic: Quick Communication - See Telephone Encounter >> Jul 09, 2020  3:47 PM L[place] wrote: CRM for notification. See Telephone encounter for: 07/09/20. Pt is wanting to see if Dr C would put in a req. For her A1C and she can do her labwork and then discuss on the 27 when she rescheduled her appt

## 2020-07-10 NOTE — Telephone Encounter (Signed)
Patient was advised and states that she gets blood work free due to working for [employer]. Patient agreed with waiting for Simona Huh to come back into the office to see if he would like for her to wait or allow her to come next week for blood work. Just a [place]

## 2020-07-10 NOTE — Telephone Encounter (Signed)
Melody Brooks  Is coming back before her appointment, he can order it if he feels it's appropriate. She might need other labs as well. She can also get a POCT A1c the day of visit.

## 2020-07-10 NOTE — Telephone Encounter (Signed)
Patient has an upcoming appointment with Simona Huh on 07/28/20, patients last HgbA1c was on 02/08/20, okay to place order for HgbA1C? Please advise. KW

## 2020-07-11 NOTE — Telephone Encounter (Signed)
Patient was advised.  

## 2020-07-11 NOTE — Telephone Encounter (Signed)
Keep appointment scheduled for 07-28-20 and get labs that day.

## 2020-07-28 ENCOUNTER — Ambulatory Visit: Payer: Managed Care, Other (non HMO) | Admitting: Family Medicine

## 2020-07-28 ENCOUNTER — Encounter: Payer: Self-pay | Admitting: Family Medicine

## 2020-07-28 ENCOUNTER — Other Ambulatory Visit: Payer: Self-pay

## 2020-07-28 VITALS — BP 150/71 | HR 84 | Temp 98.0°F | Wt 248.0 lb

## 2020-07-28 DIAGNOSIS — G4700 Insomnia, unspecified: Secondary | ICD-10-CM

## 2020-07-28 DIAGNOSIS — E1142 Type 2 diabetes mellitus with diabetic polyneuropathy: Secondary | ICD-10-CM

## 2020-07-28 DIAGNOSIS — I10 Essential (primary) hypertension: Secondary | ICD-10-CM

## 2020-07-28 DIAGNOSIS — E7800 Pure hypercholesterolemia, unspecified: Secondary | ICD-10-CM | POA: Diagnosis not present

## 2020-07-28 MED ORDER — ZOLPIDEM TARTRATE 5 MG PO TABS: 5.0000 mg | ORAL_TABLET | Freq: Every evening | ORAL | 0 refills | Status: DC | PRN

## 2020-07-28 MED ORDER — PREGABALIN 100 MG PO CAPS: 100.0000 mg | ORAL_CAPSULE | Freq: Three times a day (TID) | ORAL | 3 refills | Status: DC

## 2020-07-28 NOTE — Progress Notes (Signed)
Established patient visit   Patient: Melody Brooks   DOB: Oct 11, 1956   64 y.o. Female  MRN: [id number] Visit Date: 07/28/2020  Today's healthcare provider: Vernie Murders, PA   Chief Complaint  Patient presents with  . Diabetes   Subjective    HPI   Diabetes Mellitus Type II, Follow-up  Lab Results  Component Value Date   HGBA1C 5.9 (H) 02/08/2020   HGBA1C 5.8 (H) 09/11/2018   HGBA1C 6.8 (H) 12/02/2017   Wt Readings from Last 3 Encounters:  07/28/20 248 lb (112.5 kg)  01/08/20 254 lb (115.2 kg)  12/17/19 256 lb (116.1 kg)   Last seen for diabetes 7 months ago.  Management since then includes diet, Glipizide 2.5 mg qd and Janumet 50-1000 mg BID with Pioglitazone 45 mg HS. She reports good compliance with treatment. She is not having side effects.   Symptoms: No fatigue No foot ulcerations  No appetite changes No nausea  Yes paresthesia of the feet  No polydipsia  No polyuria No visual disturbances   No vomiting     Home blood sugar records: fasting range: 110's-150  Episodes of hypoglycemia? Yes      Pertinent Labs: Lab Results  Component Value Date   CHOL 159 02/08/2020   HDL 53 02/08/2020   LDLCALC 81 02/08/2020   TRIG 144 02/08/2020   CHOLHDL 2.5 09/11/2018   Lab Results  Component Value Date   NA 141 02/08/2020   K 4.6 02/08/2020   CREATININE 0.98 02/08/2020   GFRNONAA 61 02/08/2020   GFRAA 71 02/08/2020   GLUCOSE 112 (H) 02/08/2020     ---------------------------------------------------------------------------------------------------   Past Medical History:  Diagnosis Date  . Anemia   . Angioedema   . Arthritis   . Cancer ([place])   . Constipation   . Diabetes mellitus    Patient takes Janumet and Actos.  . Fatigue   . Fibromuscular dysplasia ([place])   . Generalized headaches   . GERD (gastroesophageal reflux disease)   . Hemorrhoids   . Hiatal hernia   . Hypertension   . Neuropathy   . Palpitations   . Rectal bleeding     . Rectal pain    Past Surgical History:  Procedure Laterality Date  . Basal cell cancer  removal    . colonoscopy  2010  . EXCISIONAL HEMORRHOIDECTOMY  03/2012  . right and left eye cataract surgery  2006, 2008   right in 2006 and left 2008  . TUBAL LIGATION  1995   Social History   Tobacco Use  . Smoking status: Never Smoker  . Smokeless tobacco: Never Used  . Tobacco comment: tobacco use- no  Substance Use Topics  . Alcohol use: No  . Drug use: No   Family Status  Relation Name Status  . Mother  Deceased at age 27       chf  . Father  Deceased       1; carotid and leg blockages.   . Mat Aunt  Alive  . PGM  Deceased  . Brother  Alive  . Sister  Alive  . Sister  Alive  . Brother  Alive  . Brother  Alive  . Brother  Alive  . Brother  Alive   Allergies  Allergen Reactions  . Lisinopril Swelling and Hives    Angioedema Angioedema       Medications: Outpatient Medications Prior to Visit  Medication Sig  . acetaminophen (TYLENOL) 325 MG tablet Take 1  tablet by mouth as needed.  . Alcohol Swabs 70 % PADS 1 each by Does not apply route 2 (two) times daily.  . Alcohol Swabs PADS Used to check blood sugars 3-4 times a day. Dx E11.9.  Marland Kitchen aspirin 81 MG tablet Take 81 mg by mouth daily.  . Blood Glucose Monitoring Suppl (ONE TOUCH ULTRA MINI) w/Device KIT 1 each by Does not apply route daily.  . cholecalciferol (VITAMIN D) 1000 units tablet TAKE 1 TABLET BY MOUTH  DAILY (Patient taking differently: 5,000 Units. )  . DEXILANT 60 MG capsule   . DiphenhydrAMINE HCl (BENADRYL ALLERGY PO) Take by mouth 2 (two) times daily.  Marland Kitchen EPIPEN 2-PAK 0.3 MG/0.3ML SOAJ injection as needed.  Marland Kitchen estrogen, conjugated,-medroxyprogesterone (PREMPRO) 0.3-1.5 MG per tablet Take 1 tablet by mouth daily.  . famotidine (PEPCID) 40 MG tablet   . glipiZIDE (GLUCOTROL XL) 2.5 MG 24 hr tablet TAKE 1 TABLET BY MOUTH  DAILY WITH BREAKFAST  . glucose blood (CONTOUR NEXT TEST) test strip Use as  instructed  . hydrochlorothiazide (MICROZIDE) 12.5 MG capsule TAKE 1 CAPSULE BY MOUTH  DAILY  . ibuprofen (ADVIL,MOTRIN) 200 MG tablet IBUPROFEN, 200MG (Oral Tablet)  2 to 4 tabs three times a day as needed for 0 days  Quantity: 60.00;  Refills: 0   Ordered :28-Aug-2010  Margarita Rana MD;  Started 23-April-2010 Active Comments: Medication taken as needed. DX: 451.9  . JANUMET 50-1000 MG tablet TAKE 1 TABLET BY MOUTH  TWICE DAILY  . Lancets (ONETOUCH ULTRASOFT) lancets USE TO TEST ONCE DAILY  . levocetirizine (XYZAL) 5 MG tablet Take 1 tablet by mouth 2 (two) times daily.   Marland Kitchen LORazepam (ATIVAN) 2 MG tablet Take 1 tablet (2 mg total) by mouth every 8 (eight) hours as needed.  Marland Kitchen losartan (COZAAR) 50 MG tablet TAKE 1 TABLET BY MOUTH  DAILY  . methocarbamol (ROBAXIN) 750 MG tablet TAKE 1/2 TO 1 TABLET BY MOUTH TWICE A DAY AS NEEDED  . ONETOUCH ULTRA test strip USE TO CHECK BLOOD SUGAR  (FASTING) EVERY MORNING.  TEST UP TO 4 TIMES DAILY IF HAVING HYPOGLYCEMIA.  . pioglitazone (ACTOS) 45 MG tablet TAKE 1 TABLET BY MOUTH AT  BEDTIME  . pregabalin (LYRICA) 100 MG capsule TAKE 1 CAPSULE BY MOUTH 3  TIMES DAILY  . Zinc 10 MG LOZG Take 1 Each/kg by mouth.  . zolpidem (AMBIEN) 5 MG tablet TAKE 1 TABLET BY MOUTH AT  BEDTIME AS NEEDED   No facility-administered medications prior to visit.    Review of Systems  Constitutional: Negative.   Eyes: Negative.   Respiratory: Negative.   Cardiovascular: Negative.   Gastrointestinal: Negative.   Endocrine: Negative.   Genitourinary: Negative.       Objective    BP (!) 150/71 (BP Location: Right Arm, Patient Position: Sitting, Cuff Size: Normal)   Pulse 84   Temp 98 F (36.7 C) (Oral)   Wt 248 lb (112.5 kg)   LMP 03/26/2012 (Approximate)   SpO2 100%   BMI 37.71 kg/m  BP Readings from Last 3 Encounters:  07/28/20 (!) 150/71  01/08/20 125/69  12/17/19 (!) 142/66     Physical Exam Constitutional:      General: She is not in acute  distress.    Appearance: She is well-developed.  HENT:     Head: Normocephalic and atraumatic.     Right Ear: Hearing normal.     Left Ear: Hearing normal.     Nose: Nose normal.  Eyes:  General: Lids are normal. No scleral icterus.       Right eye: No discharge.        Left eye: No discharge.     Conjunctiva/sclera: Conjunctivae normal.  Cardiovascular:     Rate and Rhythm: Normal rate and regular rhythm.     Heart sounds: Normal heart sounds.  Pulmonary:     Effort: Pulmonary effort is normal. No respiratory distress.  Musculoskeletal:        General: Normal range of motion.  Skin:    Findings: No lesion or rash.  Neurological:     Mental Status: She is alert and oriented to person, place, and time.  Psychiatric:        Speech: Speech normal.        Behavior: Behavior normal.        Thought Content: Thought content normal.       No results found for any visits on 07/28/20.  Assessment & Plan     1. Diabetic polyneuropathy associated with type 2 diabetes mellitus (HCC) Hgb A1C was 5.9% on 02-08-20 and states FBS at home has been in the 110-150 range. No polyuria, polydipsia, hypoglycemic episodes or vision disturbance. Polyneuropathy causing discomfort and restlessness. Recommend ophthalmology exam and continue Janumet 50-1000 mg BID, Glipizide 2.5 mg qd and Pioglitazone 45 mg qd. Refill Lyrica and Ambien for restless legs. Recheck labs and follow up pending reports. - CBC with Differential/Platelet - Comprehensive metabolic panel - Hemoglobin A1c - Lipid Panel With LDL/HDL Ratio - TSH - Urine Microalbumin w/creat. ratio - pregabalin (LYRICA) 100 MG capsule; Take 1 capsule (100 mg total) by mouth 3 (three) times daily.  Dispense: 270 capsule; Refill: 3 - zolpidem (AMBIEN) 5 MG tablet; Take 1 tablet (5 mg total) by mouth at bedtime as needed. (for peripheral neuropathy)  Dispense: 90 tablet; Refill: 0  2. Essential hypertension Well controlled with Losartan 50 mg qd  and HCTZ 12.5 mg qd. Recheck labs. - CBC with Differential/Platelet - Comprehensive metabolic panel - Lipid Panel With LDL/HDL Ratio - TSH  3. Pure hypercholesterolemia Well controlled by diet and exercise. Recheck levels and may need a statin. - Comprehensive metabolic panel - Lipid Panel With LDL/HDL Ratio - TSH  4. Insomnia, unspecified type Sleep disturbance associated with peripheral neuropathy and controlled with Lyrica 100 mg TID, Ambien 5 mg hs prn and Lorazepam 2 mg TID prn anxiety and restlessness secondary to neuropathy.   No follow-ups on file.     Andres Shad, PA, have reviewed all documentation for this visit. The documentation on 07/28/20 for the exam, diagnosis, procedures, and orders are all accurate and complete.     Vernie Murders, [place] [phone] (phone) [phone] (fax)  [place]

## 2020-08-04 ENCOUNTER — Other Ambulatory Visit: Payer: Self-pay | Admitting: Family Medicine

## 2020-08-04 DIAGNOSIS — E1142 Type 2 diabetes mellitus with diabetic polyneuropathy: Secondary | ICD-10-CM

## 2020-08-04 DIAGNOSIS — G4700 Insomnia, unspecified: Secondary | ICD-10-CM

## 2020-08-04 NOTE — Telephone Encounter (Signed)
Yours also.

## 2020-08-28 ENCOUNTER — Other Ambulatory Visit: Payer: Self-pay | Admitting: Family Medicine

## 2020-09-11 ENCOUNTER — Encounter: Payer: Self-pay | Admitting: Family Medicine

## 2020-09-11 LAB — COMPREHENSIVE METABOLIC PANEL
ALT: 17 IU/L (ref 0–32)
AST: 18 IU/L (ref 0–40)
Albumin/Globulin Ratio: 1.9 (ref 1.2–2.2)
Albumin: 4.2 g/dL (ref 3.8–4.8)
Alkaline Phosphatase: 78 IU/L (ref 44–121)
BUN/Creatinine Ratio: 20 (ref 12–28)
BUN: 20 mg/dL (ref 8–27)
Bilirubin Total: 0.4 mg/dL (ref 0.0–1.2)
CO2: 25 mmol/L (ref 20–29)
Calcium: 9.4 mg/dL (ref 8.7–10.3)
Chloride: 102 mmol/L (ref 96–106)
Creatinine, Ser: 1.02 mg/dL — ABNORMAL HIGH (ref 0.57–1.00)
GFR calc Af Amer: 67 mL/min/{1.73_m2} (ref >59)
GFR calc non Af Amer: 58 mL/min/{1.73_m2} — ABNORMAL LOW (ref >59)
Globulin, Total: 2.2 g/dL (ref 1.5–4.5)
Glucose: 98 mg/dL (ref 65–99)
Potassium: 4.5 mmol/L (ref 3.5–5.2)
Sodium: 139 mmol/L (ref 134–144)
Total Protein: 6.4 g/dL (ref 6.0–8.5)

## 2020-09-11 LAB — CBC WITH DIFFERENTIAL/PLATELET
Basophils Absolute: 0 10*3/uL (ref 0.0–0.2)
Basos: 1 %
EOS (ABSOLUTE): 0.2 10*3/uL (ref 0.0–0.4)
Eos: 3 %
Hematocrit: 33.8 % — ABNORMAL LOW (ref 34.0–46.6)
Hemoglobin: 11.4 g/dL (ref 11.1–15.9)
Immature Grans (Abs): 0 10*3/uL (ref 0.0–0.1)
Immature Granulocytes: 0 %
Lymphocytes Absolute: 2.1 10*3/uL (ref 0.7–3.1)
Lymphs: 30 %
MCH: 30.2 pg (ref 26.6–33.0)
MCHC: 33.7 g/dL (ref 31.5–35.7)
MCV: 89 fL (ref 79–97)
Monocytes Absolute: 0.4 10*3/uL (ref 0.1–0.9)
Monocytes: 6 %
Neutrophils Absolute: 4.3 10*3/uL (ref 1.4–7.0)
Neutrophils: 60 %
Platelets: 268 10*3/uL (ref 150–450)
RBC: 3.78 x10E6/uL (ref 3.77–5.28)
RDW: 13.2 % (ref 11.7–15.4)
WBC: 7.1 10*3/uL (ref 3.4–10.8)

## 2020-09-11 LAB — MICROALBUMIN / CREATININE URINE RATIO
Creatinine, Urine: 151.9 mg/dL
Microalb/Creat Ratio: 7 mg/g creat (ref 0–29)
Microalbumin, Urine: 10 ug/mL

## 2020-09-11 LAB — LIPID PANEL WITH LDL/HDL RATIO
Cholesterol, Total: 159 mg/dL (ref 100–199)
HDL: 46 mg/dL (ref >39)
LDL Chol Calc (NIH): 80 mg/dL (ref 0–99)
LDL/HDL Ratio: 1.7 ratio (ref 0.0–3.2)
Triglycerides: 197 mg/dL — ABNORMAL HIGH (ref 0–149)
VLDL Cholesterol Cal: 33 mg/dL (ref 5–40)

## 2020-09-11 LAB — TSH: TSH: 3.4 u[IU]/mL (ref 0.450–4.500)

## 2020-09-11 LAB — HEMOGLOBIN A1C
Est. average glucose Bld gHb Est-mCnc: 117 mg/dL
Hgb A1c MFr Bld: 5.7 % — ABNORMAL HIGH (ref 4.8–5.6)

## 2020-09-11 NOTE — Progress Notes (Signed)
Two tenths of a point off the normal range of hematocrit is probably just a normal variation versus any significant anemia. Should recheck level in a month to be sure it does not drop further. A multivitamin with iron daily should provide supplements to maintain blood counts.

## 2020-09-12 ENCOUNTER — Other Ambulatory Visit: Payer: Self-pay | Admitting: *Deleted

## 2020-09-12 DIAGNOSIS — D509 Iron deficiency anemia, unspecified: Secondary | ICD-10-CM

## 2020-11-01 ENCOUNTER — Encounter: Payer: Self-pay | Admitting: Family Medicine

## 2020-11-01 DIAGNOSIS — E1142 Type 2 diabetes mellitus with diabetic polyneuropathy: Secondary | ICD-10-CM

## 2020-11-05 ENCOUNTER — Other Ambulatory Visit: Payer: Self-pay | Admitting: Family Medicine

## 2020-11-05 DIAGNOSIS — E1142 Type 2 diabetes mellitus with diabetic polyneuropathy: Secondary | ICD-10-CM

## 2020-11-05 MED ORDER — ZOLPIDEM TARTRATE 5 MG PO TABS: 5.0000 mg | ORAL_TABLET | Freq: Every evening | ORAL | 0 refills | Status: DC | PRN

## 2021-07-15 ENCOUNTER — Telehealth (INDEPENDENT_AMBULATORY_CARE_PROVIDER_SITE_OTHER): Payer: Self-pay | Admitting: Vascular Surgery

## 2021-07-15 NOTE — Telephone Encounter (Signed)
Documentation only.

## 2021-07-24 ENCOUNTER — Encounter (INDEPENDENT_AMBULATORY_CARE_PROVIDER_SITE_OTHER): Payer: Self-pay | Admitting: Vascular Surgery

## 2021-07-24 ENCOUNTER — Other Ambulatory Visit (INDEPENDENT_AMBULATORY_CARE_PROVIDER_SITE_OTHER): Payer: Self-pay | Admitting: Vascular Surgery

## 2021-07-24 ENCOUNTER — Ambulatory Visit (INDEPENDENT_AMBULATORY_CARE_PROVIDER_SITE_OTHER): Payer: Medicare HMO | Admitting: Vascular Surgery

## 2021-07-24 ENCOUNTER — Ambulatory Visit (INDEPENDENT_AMBULATORY_CARE_PROVIDER_SITE_OTHER): Payer: Medicare HMO

## 2021-07-24 ENCOUNTER — Other Ambulatory Visit: Payer: Self-pay

## 2021-07-24 VITALS — BP 135/83 | HR 83 | Ht 68.0 in | Wt 252.0 lb

## 2021-07-24 DIAGNOSIS — I10 Essential (primary) hypertension: Secondary | ICD-10-CM

## 2021-07-24 DIAGNOSIS — E1142 Type 2 diabetes mellitus with diabetic polyneuropathy: Secondary | ICD-10-CM

## 2021-07-24 DIAGNOSIS — H93A9 Pulsatile tinnitus, unspecified ear: Secondary | ICD-10-CM

## 2021-07-24 DIAGNOSIS — I6523 Occlusion and stenosis of bilateral carotid arteries: Secondary | ICD-10-CM

## 2021-07-24 NOTE — Progress Notes (Signed)
MRN : [id number]  Melody Brooks is a 65 y.o. ([birth year]) female who presents with chief complaint of  Chief Complaint  Patient presents with   Follow-up    Carotid and JD heart beat in ear   .  History of Present Illness: Patient returns prior to her scheduled follow-up visit due to pulsatile tinnitus in her left ear over the past couple of months.  She has a known history of mild carotid artery disease and was scheduled to have that rechecked in the spring, but after seeing her ENT surgeon, it was felt prudent to have this rechecked somewhat earlier which I would agree with.  The patient denies any arm or leg weakness or numbness, speech or swallowing difficulty, or temporary monocular blindness.  Duplex today shows stable, 1 to 39% ICA stenosis bilaterally with minimal plaque.  Also of note is both external carotid arteries have normal velocities with no evidence of stenosis either.  Current Outpatient Medications  Medication Sig Dispense Refill   acetaminophen (TYLENOL) 325 MG tablet Take 1 tablet by mouth as needed.     Alcohol Swabs 70 % PADS 1 each by Does not apply route 2 (two) times daily. 100 each 12   Alcohol Swabs PADS Used to check blood sugars 3-4 times a day. Dx E11.9. 360 each 3   aspirin 81 MG tablet Take 81 mg by mouth daily.     Blood Glucose Monitoring Suppl (ONE TOUCH ULTRA MINI) w/Device KIT 1 each by Does not apply route daily. 1 kit 0   cholecalciferol (VITAMIN D) 1000 units tablet TAKE 1 TABLET BY MOUTH  DAILY (Patient taking differently: 5,000 Units.) 100 tablet 3   DEXILANT 60 MG capsule      DiphenhydrAMINE HCl (BENADRYL ALLERGY PO) Take by mouth 2 (two) times daily.     DULoxetine (CYMBALTA) 30 MG capsule duloxetine 30 mg capsule,delayed release     EPIPEN 2-PAK 0.3 MG/0.3ML SOAJ injection as needed.     estrogen, conjugated,-medroxyprogesterone (PREMPRO) 0.3-1.5 MG tablet Take 1 tablet by mouth daily.     famotidine (PEPCID) 40 MG tablet      glipiZIDE  (GLUCOTROL XL) 2.5 MG 24 hr tablet TAKE 1 TABLET BY MOUTH  DAILY WITH BREAKFAST 90 tablet 2   glucose blood (CONTOUR NEXT TEST) test strip TEST TWICE DAILY 200 strip 3   hydrochlorothiazide (MICROZIDE) 12.5 MG capsule TAKE 1 CAPSULE BY MOUTH  DAILY 90 capsule 2   ibuprofen (ADVIL,MOTRIN) 200 MG tablet IBUPROFEN, 200MG (Oral Tablet)  2 to 4 tabs three times a day as needed for 0 days  Quantity: 60.00;  Refills: 0   Ordered :28-Aug-2010  Margarita Rana MD;  Started 23-April-2010 Active Comments: Medication taken as needed. DX: 451.9     JANUMET 50-1000 MG tablet TAKE 1 TABLET BY MOUTH  TWICE DAILY 180 tablet 2   Lancets (ONETOUCH ULTRASOFT) lancets USE TO TEST ONCE DAILY 100 each 3   levocetirizine (XYZAL) 5 MG tablet Take 1 tablet by mouth 2 (two) times daily.      LORazepam (ATIVAN) 2 MG tablet TAKE 1 TABLET BY MOUTH  EVERY 8 HOURS AS NEEDED 90 tablet 0   losartan (COZAAR) 50 MG tablet TAKE 1 TABLET BY MOUTH  DAILY 90 tablet 2   losartan (COZAAR) 50 MG tablet Take 1 tablet by mouth daily.     methocarbamol (ROBAXIN) 750 MG tablet TAKE 1/2 TO 1 TABLET BY MOUTH TWICE A DAY AS NEEDED  5   ONETOUCH  ULTRA test strip USE TO CHECK BLOOD SUGAR  (FASTING) EVERY MORNING.  TEST UP TO 4 TIMES DAILY IF HAVING HYPOGLYCEMIA. 400 each 3   pioglitazone (ACTOS) 45 MG tablet TAKE 1 TABLET BY MOUTH AT  BEDTIME 60 tablet 5   predniSONE (DELTASONE) 10 MG tablet prednisone 10 mg tablet  TAKE 3 TABLETS BY MOUTH FOR 5 DAYS FOR HIVES/ITCHING     pregabalin (LYRICA) 100 MG capsule Take 1 capsule (100 mg total) by mouth 3 (three) times daily. 270 capsule 3   rosuvastatin (CRESTOR) 10 MG tablet rosuvastatin 10 mg tablet     Zinc 10 MG LOZG Take 1 Each/kg by mouth.     zolpidem (AMBIEN) 5 MG tablet Take 1 tablet (5 mg total) by mouth at bedtime as needed. (for peripheral neuropathy) 30 tablet 0   No current facility-administered medications for this visit.    Past Medical History:  Diagnosis Date   Anemia     Angioedema    Arthritis    Cancer ([place])    Constipation    Diabetes mellitus    Patient takes Janumet and Actos.   Fatigue    Fibromuscular dysplasia (HCC)    Generalized headaches    GERD (gastroesophageal reflux disease)    Hemorrhoids    Hiatal hernia    Hypertension    Neuropathy    Palpitations    Rectal bleeding    Rectal pain     Past Surgical History:  Procedure Laterality Date   Basal cell cancer  removal     colonoscopy  2010   EXCISIONAL HEMORRHOIDECTOMY  03/2012   right and left eye cataract surgery  2006, 2008   right in 2006 and left 2008   TUBAL LIGATION  1995     Social History   Tobacco Use   Smoking status: Never   Smokeless tobacco: Never   Tobacco comments:    tobacco use- no  Substance Use Topics   Alcohol use: No   Drug use: No       Family History  Problem Relation Age of Onset   Heart failure Mother    COPD Mother    Hypertension Mother    Arthritis Mother    Cancer Father        prostate   Parkinson's disease Father    Colon cancer Father    Cancer Maternal Aunt        colon   Cancer Paternal Grandmother        stomach   Diabetes Brother    Diabetes Brother      Allergies  Allergen Reactions   Lisinopril Swelling and Hives    Angioedema Angioedema      REVIEW OF SYSTEMS (Negative unless checked)   Constitutional: _0 Weight loss  _1 Fever  _2 Chills Cardiac: _3 Chest pain   _4 Chest pressure   _5 Palpitations   _6 Shortness of breath when laying flat   _7 Shortness of breath at rest   _8 Shortness of breath with exertion. Vascular:  _9 Pain in legs with walking   _10 Pain in legs at rest   _11 Pain in legs when laying flat   _12 Claudication   _13 Pain in feet when walking  _14 Pain in feet at rest  _15 Pain in feet when laying flat   _16 History of DVT   _17 Phlebitis   _18 Swelling in legs   _19 Varicose veins   _20 Non-healing ulcers Pulmonary:   _21 Uses home oxygen   _22 Productive cough   _23 Hemoptysis   _24 Wheeze  _25 COPD   _26 Asthma Neurologic:   _27   Dizziness  _0 Blackouts   _1 Seizures   _2 History of stroke   _3 History of TIA  _4 Aphasia   _5 Temporary blindness   _6 Dysphagia   _7 Weakness or numbness in arms   _8 Weakness or numbness in legs Musculoskeletal:  _9 Arthritis   _10 Joint swelling   _11 Joint pain   _12 Low back pain Hematologic:  _13 Easy bruising  _14 Easy bleeding   _15 Hypercoagulable state   _16 Anemic  _17 Hepatitis Gastrointestinal:  _18 Blood in stool   _19 Vomiting blood  _20 Gastroesophageal reflux/heartburn   _21 Difficulty swallowing. Genitourinary:  _22 Chronic kidney disease   _23 Difficult urination  _24 Frequent urination  _25 Burning with urination   _26 Blood in urine Skin:  _27 Rashes   _28 Ulcers   _29 Wounds Psychological:  _30 History of anxiety   _31  History of major depression.  Physical Examination  Vitals:   07/24/21 0835  BP: 135/83  Pulse: 83  Weight: 252 lb (114.3 kg)  Height: _32  (1.727 m)   Body mass index is 38.32 kg/m. Gen:  WD/WN, NAD Head: [place]/AT, No temporalis wasting. Ear/Nose/Throat: Hearing grossly intact, nares w/o erythema or drainage, trachea midline Eyes: Conjunctiva clear. Sclera non-icteric Neck: Supple.  No bruit  Pulmonary:  Good air movement, equal and clear to auscultation bilaterally.  Cardiac: RRR, No JVD Vascular:  Vessel Right Left  Radial Palpable Palpable           Musculoskeletal: M/S 5/5 throughout.  No deformity or atrophy.  Mild lower extremity edema. Neurologic: CN 2-12 intact. Sensation grossly intact in extremities.  Symmetrical.  Speech is fluent. Motor exam as listed above. Psychiatric: Judgment intact, Mood & affect appropriate for pt's clinical situation. Dermatologic: No rashes or ulcers noted.  No cellulitis or open wounds.     CBC Lab Results  Component Value Date   WBC 7.1 09/10/2020   HGB 11.4 09/10/2020   HCT 33.8 (L) 09/10/2020   MCV 89 09/10/2020   PLT 268 09/10/2020    BMET    Component Value Date/Time   NA 139 09/10/2020 1100   K 4.5 09/10/2020 1100   CL 102  09/10/2020 1100   CO2 25 09/10/2020 1100   GLUCOSE 98 09/10/2020 1100   BUN 20 09/10/2020 1100   CREATININE 1.02 (H) 09/10/2020 1100   CALCIUM 9.4 09/10/2020 1100   GFRNONAA 58 (L) 09/10/2020 1100   GFRAA 67 09/10/2020 1100   CrCl cannot be calculated (Patient's most recent lab result is older than the maximum 21 days allowed.).  COAG No results found for: INR, PROTIME  Radiology No results found.   Assessment/Plan Essential hypertension blood pressure control important in reducing the progression of atherosclerotic disease. On appropriate oral medications.     Diabetes mellitus with polyneuropathy blood glucose control important in reducing the progression of atherosclerotic disease. Also, involved in wound healing. On appropriate medications.  Carotid stenosis Duplex today shows stable, 1 to 39% ICA stenosis bilaterally with minimal plaque.  Also of note is both external carotid arteries have normal velocities with no evidence of stenosis either.  I do not see an obvious cause for pulsatile tinnitus.  She discussed that Dr. Pryor Ochoa was considering further imaging to work this up which I think is reasonable.  We will continue to follow this on an every other year basis at this point.    Leotis Pain, MD  07/24/2021 9:30 AM    This note was created with Dragon medical transcription system.  Any errors from dictation are purely unintentional

## 2021-07-24 NOTE — Assessment & Plan Note (Signed)
Duplex today shows stable, 1 to 39% ICA stenosis bilaterally with minimal plaque.  Also of note is both external carotid arteries have normal velocities with no evidence of stenosis either.  I do not see an obvious cause for pulsatile tinnitus.  She discussed that Dr. Pryor Ochoa was considering further imaging to work this up which I think is reasonable.  We will continue to follow this on an every other year basis at this point.

## 2021-08-11 ENCOUNTER — Other Ambulatory Visit: Payer: Self-pay | Admitting: Otolaryngology

## 2021-08-11 DIAGNOSIS — H93A2 Pulsatile tinnitus, left ear: Secondary | ICD-10-CM

## 2021-08-21 ENCOUNTER — Ambulatory Visit
Admission: RE | Admit: 2021-08-21 | Discharge: 2021-08-21 | Disposition: A | Discharge status: Home / Self Care | Payer: Medicare HMO | Source: Ambulatory Visit | Attending: Otolaryngology | Admitting: Otolaryngology

## 2021-08-21 ENCOUNTER — Other Ambulatory Visit: Payer: Self-pay

## 2021-08-21 ENCOUNTER — Ambulatory Visit: Payer: Medicare HMO

## 2021-08-21 DIAGNOSIS — H93A2 Pulsatile tinnitus, left ear: Secondary | ICD-10-CM | POA: Insufficient documentation

## 2021-08-21 MED ORDER — GADOBUTROL 1 MMOL/ML IV SOLN
10.0000 mL | Freq: Once | INTRAVENOUS | Status: AC | PRN
  Administered 2021-08-21: 10 mL via INTRAVENOUS

## 2021-08-25 ENCOUNTER — Other Ambulatory Visit (HOSPITAL_COMMUNITY): Payer: Self-pay | Admitting: Neuroradiology

## 2021-08-25 DIAGNOSIS — H93A2 Pulsatile tinnitus, left ear: Secondary | ICD-10-CM

## 2021-08-25 DIAGNOSIS — I671 Cerebral aneurysm, nonruptured: Secondary | ICD-10-CM

## 2021-08-28 ENCOUNTER — Other Ambulatory Visit: Payer: Self-pay

## 2021-08-28 ENCOUNTER — Ambulatory Visit (HOSPITAL_COMMUNITY)
Admission: RE | Admit: 2021-08-28 | Discharge: 2021-08-28 | Disposition: A | Discharge status: Home / Self Care | Payer: Medicare HMO | Source: Ambulatory Visit | Attending: Neuroradiology | Admitting: Neuroradiology

## 2021-08-28 DIAGNOSIS — H93A2 Pulsatile tinnitus, left ear: Secondary | ICD-10-CM

## 2021-08-28 DIAGNOSIS — I671 Cerebral aneurysm, nonruptured: Secondary | ICD-10-CM

## 2021-08-28 NOTE — Consult Note (Signed)
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  Referring Physician(s): Vaught, Advertising copywriter  Supervising Physician: Pedro Earls  Patient Status: [facility] - Out-pt  History of Present Illness: Melody Brooks is a 65 y.o. female with a past medical history of Type 2 diabetes mellitus, hypertension, peripheral neuropathy and fibromuscular dysplasia complaining of intermittent pulsatile tinnitus of left ear, seen by Dr. Pryor Ochoa.  As part of her workup for tinnitus, she underwent an MRI/MRA of the brain which was positive for a 1-2 mm anteriorly projecting outpouching from the left P1-P2/PCA junction, concerning for small aneurysm.  A high riding left jugular bulb wall was also seen. She presents today with her husband to discuss imaging findings.   She has a family history of hemorrhagic stroke (mother), although it is not clear if it was an aneurysm rupture.  She does not smoke and has never smoked.  She tells me that her blood pressure and diabetes are well controlled and that she takes her medications as prescribed.   Past Medical History:  Diagnosis Date   Anemia    Angioedema    Arthritis    Cancer ([place])    Constipation    Diabetes mellitus    Patient takes Janumet and Actos.   Fatigue    Fibromuscular dysplasia (HCC)    Generalized headaches    GERD (gastroesophageal reflux disease)    Hemorrhoids    Hiatal hernia    Hypertension    Neuropathy    Palpitations    Rectal bleeding    Rectal pain     Past Surgical History:  Procedure Laterality Date   Basal cell cancer  removal     colonoscopy  2010   EXCISIONAL HEMORRHOIDECTOMY  03/2012   right and left eye cataract surgery  2006, 2008   right in 2006 and left 2008   TUBAL LIGATION  1995    Allergies: Lisinopril  Medications: Prior to Admission medications   Medication Sig Start Date End Date Taking?  Authorizing Provider  acetaminophen (TYLENOL) 325 MG tablet Take 1 tablet by mouth as needed.    [provider]  Alcohol Swabs 70 % PADS 1 each by Does not apply route 2 (two) times daily. 10/24/19   Chrismon, Vickki Muff, PA-C  Alcohol Swabs PADS Used to check blood sugars 3-4 times a day. Dx E11.9. 01/02/16   Margarita Rana, MD  aspirin 81 MG tablet Take 81 mg by mouth daily.    [provider]  Blood Glucose Monitoring Suppl (ONE TOUCH ULTRA MINI) w/Device KIT 1 each by Does not apply route daily. 09/13/19   Chrismon, Vickki Muff, PA-C  cholecalciferol (VITAMIN D) 1000 units tablet TAKE 1 TABLET BY MOUTH  DAILY Patient taking differently: 5,000 Units. 04/29/17   Chrismon, Vickki Muff, PA-C  DEXILANT 60 MG capsule  04/18/19   [provider]  DiphenhydrAMINE HCl (BENADRYL ALLERGY PO) Take by mouth 2 (two) times daily.    [provider]  DULoxetine (CYMBALTA) 30 MG capsule duloxetine 30 mg capsule,delayed release 03/11/21   [provider]  EPIPEN 2-PAK 0.3 MG/0.3ML SOAJ injection as needed. 10/05/15   [provider]  estrogen, conjugated,-medroxyprogesterone (PREMPRO) 0.3-1.5 MG tablet Take 1 tablet by mouth daily.    [provider]  famotidine (PEPCID) 40 MG tablet  03/22/19   [provider]  glipiZIDE (GLUCOTROL XL) 2.5 MG 24 hr tablet TAKE 1 TABLET BY MOUTH  DAILY WITH BREAKFAST 04/21/20   Chrismon, Simona Huh E, PA-C  glucose blood (CONTOUR NEXT TEST) test strip TEST TWICE DAILY 08/28/20   Chrismon, Vickki Muff, PA-C  hydrochlorothiazide (MICROZIDE) 12.5 MG capsule TAKE 1 CAPSULE BY MOUTH  DAILY 04/21/20   Chrismon, Vickki Muff, PA-C  ibuprofen (ADVIL,MOTRIN) 200 MG tablet IBUPROFEN, 200MG (Oral Tablet)  2 to 4 tabs three times a day as needed for 0 days  Quantity: 60.00;  Refills: 0   Ordered :28-Aug-2010  Margarita Rana MD;  Started 23-April-2010 Active Comments: Medication taken as needed. DX: 451.9 04/23/10   [provider]   JANUMET 50-1000 MG tablet TAKE 1 TABLET BY MOUTH  TWICE DAILY 04/21/20   Chrismon, Vickki Muff, PA-C  Lancets (ONETOUCH ULTRASOFT) lancets USE TO TEST ONCE DAILY 01/25/20   Chrismon, Vickki Muff, PA-C  levocetirizine (XYZAL) 5 MG tablet Take 1 tablet by mouth 2 (two) times daily.  10/13/15   [provider]  LORazepam (ATIVAN) 2 MG tablet TAKE 1 TABLET BY MOUTH  EVERY 8 HOURS AS NEEDED 08/04/20   Chrismon, Vickki Muff, PA-C  losartan (COZAAR) 50 MG tablet TAKE 1 TABLET BY MOUTH  DAILY 04/21/20   Chrismon, Vickki Muff, PA-C  losartan (COZAAR) 50 MG tablet Take 1 tablet by mouth daily. 12/01/20   [provider]  methocarbamol (ROBAXIN) 750 MG tablet TAKE 1/2 TO 1 TABLET BY MOUTH TWICE A DAY AS NEEDED 11/08/17   [provider]  ONETOUCH ULTRA test strip USE TO CHECK BLOOD SUGAR  (FASTING) EVERY MORNING.  TEST UP TO 4 TIMES DAILY IF HAVING HYPOGLYCEMIA. 03/27/19   Chrismon, Simona Huh E, PA-C  pioglitazone (ACTOS) 45 MG tablet TAKE 1 TABLET BY MOUTH AT  BEDTIME 04/11/20   Chrismon, Vickki Muff, PA-C  predniSONE (DELTASONE) 10 MG tablet prednisone 10 mg tablet  TAKE 3 TABLETS BY MOUTH FOR 5 DAYS FOR HIVES/ITCHING    [provider]  pregabalin (LYRICA) 100 MG capsule Take 1 capsule (100 mg total) by mouth 3 (three) times daily. 07/28/20   Chrismon, Vickki Muff, PA-C  rosuvastatin (CRESTOR) 10 MG tablet rosuvastatin 10 mg tablet 03/11/21   [provider]  Zinc 10 MG LOZG Take 1 Each/kg by mouth.    [provider]  zolpidem (AMBIEN) 5 MG tablet Take 1 tablet (5 mg total) by mouth at bedtime as needed. (for peripheral neuropathy) 11/05/20   Chrismon, Vickki Muff, PA-C     Family History  Problem Relation Age of Onset   Heart failure Mother    COPD Mother    Hypertension Mother    Arthritis Mother    Cancer Father        prostate   Parkinson's disease Father    Colon cancer Father    Cancer Maternal Aunt        colon   Cancer Paternal Grandmother        stomach   Diabetes  Brother    Diabetes Brother     Social History   Socioeconomic History   Marital status: Married    Spouse name: Not on file   Number of children: Not on file   Years of education: Not on file   Highest education level: Not on file  Occupational History   Not on file  Tobacco Use   Smoking status: Never   Smokeless tobacco: Never   Tobacco comments:    tobacco use- no  Substance and Sexual Activity   Alcohol use: No   Drug use: No  Sexual activity: Not on file  Other Topics Concern   Not on file  Social History Narrative   Full time. Married. Regularly exercises- walks.    Social Determinants of Health   Financial Resource Strain: Not on file  Food Insecurity: Not on file  Transportation Needs: Not on file  Physical Activity: Not on file  Stress: Not on file  Social Connections: Not on file     Review of Systems: A 12 point ROS discussed and pertinent positives are indicated in the HPI above.  All other systems are negative.  Review of Systems  HENT:  Positive for tinnitus.   Musculoskeletal:  Positive for back pain.  Neurological:  Positive for numbness.   Vital Signs: LMP 03/26/2012 (Approximate)   Physical Exam Constitutional:      General: She is not in acute distress. Eyes:     Extraocular Movements: Extraocular movements intact.     Pupils: Pupils are equal, round, and reactive to light.  Neurological:     Mental Status: She is alert and oriented to person, place, and time.     Cranial Nerves: No cranial nerve deficit.     Sensory: Sensory deficit present.     Comments: Decreased sensation in the medial aspect of the left leg.   Psychiatric:        Thought Content: Thought content normal.        Judgment: Judgment normal.         Imaging: MR ANGIO HEAD WO CONTRAST  Result Date: 08/21/2021 CLINICAL DATA:  Pulsatile tinnitus of left ear. Additional history provided by scanning technologist: Patient reports pulsatile tinnitus and left ear  intermittently for several years, constant over the past 2-3 months. EXAM: MRI HEAD WITHOUT AND WITH CONTRAST MRA HEAD WITHOUT CONTRAST TECHNIQUE: Multiplanar, multi-echo pulse sequences of the brain and surrounding structures were acquired without and with intravenous contrast. Angiographic images of the Circle of Willis were acquired using MRA technique without intravenous contrast. CONTRAST:  57m GADAVIST GADOBUTROL 1 MMOL/ML IV SOLN COMPARISON:  No pertinent prior exams available for comparison. FINDINGS: MRI HEAD FINDINGS Brain: Mild generalized cerebral atrophy. There are two foci of T2 FLAIR hyperintense signal abnormality within the left frontoparietal white matter measuring up to 8 mm, one in the left periventricular white matter and the other in the subcortical white matter (series 14, images 34 and 33). No evidence of an intracranial mass. Specifically, no cerebellopontine angle or internal auditory canal mass is demonstrated. Unremarkable appearance of the 7th and 8th cranial nerves bilaterally. Thinned appearance of the bony coverings of the superior semicircular canals bilaterally. There is no acute infarct. No chronic intracranial blood products. No extra-axial fluid collection. No midline shift. Partially empty sella turcica. No pathologic intracranial enhancement identified. Vascular: Maintained flow voids within the proximal large arterial vessels. The left jugular bulb is slightly high in position as compared to the right. Skull and upper cervical spine: No focal suspicious marrow lesion. Sinuses/Orbits: Visualized orbits show no acute finding. Bilateral lens replacements. Trace mucosal thickening within the bilateral ethmoid and right maxillary sinuses. Other: Small bilateral mastoid effusions. MRA HEAD FINDINGS Anterior circulation: The intracranial internal carotid arteries are patent. The M1 middle cerebral arteries are patent. No M2 proximal branch occlusion or high-grade proximal stenosis  is identified. The anterior cerebral arteries are patent. 1 mm inferiorly projecting infundibulum arising from the cavernous left ICA. Posterior circulation: The intracranial vertebral arteries are patent. The basilar artery is patent. The posterior cerebral arteries are patent. Fetal  origin right posterior cerebral artery. The left posterior communicating artery is diminutive or absent. 1-2 mm anteriorly projecting vascular protrusion arising from the left posterior cerebral artery at the P1/P2 junction, which may reflect a small aneurysm (series 1, image 91). Anatomic variants: As described. IMPRESSION: MRI brain: 1. No evidence of acute intracranial abnormality. 2. Thinned appearance of the bony coverings of the superior semicircular canals, bilaterally. Consider a non-contrast CT of the temporal bones to better assess for superior semicircular canal dehiscence. 3. The left jugular bulb is slightly high in position as compared to the right. 4. Two foci of T2 FLAIR hyperintense signal abnormality within the left frontoparietal white matter, measuring up to 8 mm. These signal changes are nonspecific, but most often secondary to chronic small vessel ischemia. Alternative considerations include sequela of a prior infectious/inflammatory process, sequela of chronic migraine headaches or sequela of a demyelinating process (such as multiple sclerosis). 5. Mild generalized cerebral atrophy. 6. Small bilateral mastoid effusions. MRA head: 1. No intracranial large vessel occlusion or proximal high-grade arterial stenosis. 2. No vascular cause of pulsatile tinnitus is identified. 3. 1-2 mm anteriorly projecting vascular protrusion arising from the left posterior cerebral artery at the P1/P2 junction, which may reflect a small aneurysm. Electronically Signed   By: Kellie Simmering D.O.   On: 08/21/2021 16:28   MR BRAIN/IAC W WO CONTRAST  Result Date: 08/21/2021 CLINICAL DATA:  Pulsatile tinnitus of left ear. Additional  history provided by scanning technologist: Patient reports pulsatile tinnitus and left ear intermittently for several years, constant over the past 2-3 months. EXAM: MRI HEAD WITHOUT AND WITH CONTRAST MRA HEAD WITHOUT CONTRAST TECHNIQUE: Multiplanar, multi-echo pulse sequences of the brain and surrounding structures were acquired without and with intravenous contrast. Angiographic images of the Circle of Willis were acquired using MRA technique without intravenous contrast. CONTRAST:  75m GADAVIST GADOBUTROL 1 MMOL/ML IV SOLN COMPARISON:  No pertinent prior exams available for comparison. FINDINGS: MRI HEAD FINDINGS Brain: Mild generalized cerebral atrophy. There are two foci of T2 FLAIR hyperintense signal abnormality within the left frontoparietal white matter measuring up to 8 mm, one in the left periventricular white matter and the other in the subcortical white matter (series 14, images 34 and 33). No evidence of an intracranial mass. Specifically, no cerebellopontine angle or internal auditory canal mass is demonstrated. Unremarkable appearance of the 7th and 8th cranial nerves bilaterally. Thinned appearance of the bony coverings of the superior semicircular canals bilaterally. There is no acute infarct. No chronic intracranial blood products. No extra-axial fluid collection. No midline shift. Partially empty sella turcica. No pathologic intracranial enhancement identified. Vascular: Maintained flow voids within the proximal large arterial vessels. The left jugular bulb is slightly high in position as compared to the right. Skull and upper cervical spine: No focal suspicious marrow lesion. Sinuses/Orbits: Visualized orbits show no acute finding. Bilateral lens replacements. Trace mucosal thickening within the bilateral ethmoid and right maxillary sinuses. Other: Small bilateral mastoid effusions. MRA HEAD FINDINGS Anterior circulation: The intracranial internal carotid arteries are patent. The M1 middle  cerebral arteries are patent. No M2 proximal branch occlusion or high-grade proximal stenosis is identified. The anterior cerebral arteries are patent. 1 mm inferiorly projecting infundibulum arising from the cavernous left ICA. Posterior circulation: The intracranial vertebral arteries are patent. The basilar artery is patent. The posterior cerebral arteries are patent. Fetal origin right posterior cerebral artery. The left posterior communicating artery is diminutive or absent. 1-2 mm anteriorly projecting vascular protrusion arising from the left  posterior cerebral artery at the P1/P2 junction, which may reflect a small aneurysm (series 1, image 91). Anatomic variants: As described. IMPRESSION: MRI brain: 1. No evidence of acute intracranial abnormality. 2. Thinned appearance of the bony coverings of the superior semicircular canals, bilaterally. Consider a non-contrast CT of the temporal bones to better assess for superior semicircular canal dehiscence. 3. The left jugular bulb is slightly high in position as compared to the right. 4. Two foci of T2 FLAIR hyperintense signal abnormality within the left frontoparietal white matter, measuring up to 8 mm. These signal changes are nonspecific, but most often secondary to chronic small vessel ischemia. Alternative considerations include sequela of a prior infectious/inflammatory process, sequela of chronic migraine headaches or sequela of a demyelinating process (such as multiple sclerosis). 5. Mild generalized cerebral atrophy. 6. Small bilateral mastoid effusions. MRA head: 1. No intracranial large vessel occlusion or proximal high-grade arterial stenosis. 2. No vascular cause of pulsatile tinnitus is identified. 3. 1-2 mm anteriorly projecting vascular protrusion arising from the left posterior cerebral artery at the P1/P2 junction, which may reflect a small aneurysm. Electronically Signed   By: Kellie Simmering D.O.   On: 08/21/2021 16:28    Labs:  CBC: Recent  Labs    09/10/20 1100  WBC 7.1  HGB 11.4  HCT 33.8*  PLT 268    COAGS: No results for input(s): INR, APTT in the last 8760 hours.  BMP: Recent Labs    09/10/20 1100  NA 139  K 4.5  CL 102  CO2 25  GLUCOSE 98  BUN 20  CALCIUM 9.4  CREATININE 1.02*  GFRNONAA 58*  GFRAA 67    LIVER FUNCTION TESTS: Recent Labs    09/10/20 1100  BILITOT 0.4  AST 18  ALT 17  ALKPHOS 78  PROT 6.4  ALBUMIN 4.2    TUMOR MARKERS: No results for input(s): AFPTM, CEA, CA199, CHROMGRNA in the last 8760 hours.  Assessment and Plan:  Mrs. 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  We reviewed the imaging findings and possible vascular causes of pulsatile tinnitus such as dural AV fistula, sinus diverticulum and stenosis. We also discussed brain aneurysms, including aneurysmal subarachnoid hemorrhage and natural history of brain aneurysms.  She was instructed to call 911 should she developed any signs or symptoms suspicious for a ruptured brain aneurysm.  I informed her that given the aneurysm size, risk of subarachnoid hemorrhage is low.  I suggested we performed a diagnostic cerebral angiogram to better evaluate the aneurysm and to investigate for vascular causes of pulsatile tinnitus.  Risks and benefits were discussed.  She agrees with the plan and would like to undergo the cerebral angiogram.  We will contact her to schedule her angiogram under moderate sedation in the next few days.   Thank you for this interesting consult.  I greatly enjoyed meeting Melody Brooks and look forward to participating in their care.  A copy of this report was sent to the requesting provider on this date.  Electronically Signed: Pedro Earls, MD 08/28/2021, 12:10 PM   I spent a total of  60 Minutes   in face to face in clinical consultation, greater than 50% of which was counseling/coordinating care for pulsatile tinnitus  and cerebral aneurysm.

## 2021-09-02 ENCOUNTER — Other Ambulatory Visit (HOSPITAL_COMMUNITY): Payer: Self-pay | Admitting: Neuroradiology

## 2021-09-02 DIAGNOSIS — H93A2 Pulsatile tinnitus, left ear: Secondary | ICD-10-CM

## 2021-09-02 DIAGNOSIS — I671 Cerebral aneurysm, nonruptured: Secondary | ICD-10-CM

## 2021-09-03 ENCOUNTER — Other Ambulatory Visit: Payer: Self-pay | Admitting: Radiology

## 2021-09-03 ENCOUNTER — Ambulatory Visit (HOSPITAL_COMMUNITY): Admission: RE | Admit: 2021-09-03 | Payer: Medicare HMO | Source: Ambulatory Visit

## 2021-09-04 ENCOUNTER — Other Ambulatory Visit (HOSPITAL_COMMUNITY): Payer: Self-pay | Admitting: Neuroradiology

## 2021-09-04 ENCOUNTER — Encounter (HOSPITAL_COMMUNITY): Payer: Self-pay

## 2021-09-04 ENCOUNTER — Ambulatory Visit (HOSPITAL_COMMUNITY)
Admission: RE | Admit: 2021-09-04 | Discharge: 2021-09-04 | Disposition: A | Discharge status: Home / Self Care | Payer: Medicare HMO | Source: Ambulatory Visit | Attending: Neuroradiology | Admitting: Neuroradiology

## 2021-09-04 ENCOUNTER — Other Ambulatory Visit: Payer: Self-pay

## 2021-09-04 DIAGNOSIS — Z7982 Long term (current) use of aspirin: Secondary | ICD-10-CM | POA: Diagnosis not present

## 2021-09-04 DIAGNOSIS — Z79899 Other long term (current) drug therapy: Secondary | ICD-10-CM | POA: Diagnosis not present

## 2021-09-04 DIAGNOSIS — H93A2 Pulsatile tinnitus, left ear: Secondary | ICD-10-CM | POA: Insufficient documentation

## 2021-09-04 DIAGNOSIS — Z888 Allergy status to other drugs, medicaments and biological substances status: Secondary | ICD-10-CM | POA: Insufficient documentation

## 2021-09-04 DIAGNOSIS — I671 Cerebral aneurysm, nonruptured: Secondary | ICD-10-CM

## 2021-09-04 DIAGNOSIS — Z7984 Long term (current) use of oral hypoglycemic drugs: Secondary | ICD-10-CM | POA: Diagnosis not present

## 2021-09-04 HISTORY — PX: IR ANGIO INTRA EXTRACRAN SEL INTERNAL CAROTID BILAT MOD SED: IMG5363

## 2021-09-04 HISTORY — PX: IR ANGIO VERTEBRAL SEL SUBCLAVIAN INNOMINATE UNI L MOD SED: IMG5364

## 2021-09-04 HISTORY — PX: IR ANGIO EXTERNAL CAROTID SEL EXT CAROTID BILAT MOD SED: IMG5372

## 2021-09-04 HISTORY — PX: IR ANGIO VERTEBRAL SEL VERTEBRAL UNI R MOD SED: IMG5368

## 2021-09-04 HISTORY — PX: IR US GUIDE VASC ACCESS RIGHT: IMG2390

## 2021-09-04 LAB — CBC
HCT: 35 % — ABNORMAL LOW (ref 36.0–46.0)
Hemoglobin: 11.3 g/dL — ABNORMAL LOW (ref 12.0–15.0)
MCH: 30.5 pg (ref 26.0–34.0)
MCHC: 32.3 g/dL (ref 30.0–36.0)
MCV: 94.3 fL (ref 80.0–100.0)
Platelets: 231 10*3/uL (ref 150–400)
RBC: 3.71 MIL/uL — ABNORMAL LOW (ref 3.87–5.11)
RDW: 14.5 % (ref 11.5–15.5)
WBC: 7.9 10*3/uL (ref 4.0–10.5)
nRBC: 0 % (ref 0.0–0.2)

## 2021-09-04 LAB — BASIC METABOLIC PANEL
Anion gap: 7 (ref 5–15)
BUN: 20 mg/dL (ref 8–23)
CO2: 28 mmol/L (ref 22–32)
Calcium: 9.3 mg/dL (ref 8.9–10.3)
Chloride: 104 mmol/L (ref 98–111)
Creatinine, Ser: 1.2 mg/dL — ABNORMAL HIGH (ref 0.44–1.00)
GFR, Estimated: 50 mL/min — ABNORMAL LOW (ref >60)
Glucose, Bld: 139 mg/dL — ABNORMAL HIGH (ref 70–99)
Potassium: 4.7 mmol/L (ref 3.5–5.1)
Sodium: 139 mmol/L (ref 135–145)

## 2021-09-04 LAB — PROTIME-INR
INR: 1 (ref 0.8–1.2)
Prothrombin Time: 13.1 seconds (ref 11.4–15.2)

## 2021-09-04 LAB — GLUCOSE, CAPILLARY: Glucose-Capillary: 107 mg/dL — ABNORMAL HIGH (ref 70–99)

## 2021-09-04 MED ORDER — IOHEXOL 300 MG/ML  SOLN
100.0000 mL | Freq: Once | INTRAMUSCULAR | Status: AC | PRN
  Administered 2021-09-04: 80 mL via INTRA_ARTERIAL

## 2021-09-04 MED ORDER — LIDOCAINE HCL 1 % IJ SOLN
INTRAMUSCULAR | Status: AC
  Administered 2021-09-04: 1 mL via SUBCUTANEOUS
  Filled 2021-09-04: qty 20

## 2021-09-04 MED ORDER — SODIUM CHLORIDE 0.9 % IV SOLN: Freq: Once | INTRAVENOUS | Status: AC

## 2021-09-04 MED ORDER — HEPARIN SODIUM (PORCINE) 1000 UNIT/ML IJ SOLN
INTRAMUSCULAR | Status: AC
  Administered 2021-09-04: 5000 [IU] via INTRA_ARTERIAL
  Filled 2021-09-04: qty 1

## 2021-09-04 MED ORDER — FENTANYL CITRATE (PF) 100 MCG/2ML IJ SOLN
INTRAMUSCULAR | Status: AC | PRN
  Administered 2021-09-04: 50 ug via INTRAVENOUS
  Administered 2021-09-04 (×2): 25 ug via INTRAVENOUS

## 2021-09-04 MED ORDER — VERAPAMIL HCL 2.5 MG/ML IV SOLN
INTRA_ARTERIAL | Status: AC | PRN
  Administered 2021-09-04: 11 mL via INTRA_ARTERIAL

## 2021-09-04 MED ORDER — MIDAZOLAM HCL 2 MG/2ML IJ SOLN
INTRAMUSCULAR | Status: AC
  Administered 2021-09-04: 5 mg via INTRA_ARTERIAL
  Filled 2021-09-04: qty 4

## 2021-09-04 MED ORDER — NITROGLYCERIN 1 MG/10 ML FOR IR/CATH LAB
INTRA_ARTERIAL | Status: AC
  Filled 2021-09-04: qty 10

## 2021-09-04 MED ORDER — VERAPAMIL HCL 2.5 MG/ML IV SOLN
INTRAVENOUS | Status: AC
  Filled 2021-09-04: qty 2

## 2021-09-04 MED ORDER — MIDAZOLAM HCL 2 MG/2ML IJ SOLN
INTRAMUSCULAR | Status: AC | PRN
  Administered 2021-09-04: 1 mg via INTRAVENOUS
  Administered 2021-09-04 (×4): .5 mg via INTRAVENOUS

## 2021-09-04 MED ORDER — IOHEXOL 300 MG/ML  SOLN: 100.0000 mL | Freq: Once | INTRAMUSCULAR | Status: DC | PRN

## 2021-09-04 MED ORDER — FENTANYL CITRATE (PF) 100 MCG/2ML IJ SOLN
INTRAMUSCULAR | Status: AC
  Filled 2021-09-04: qty 2

## 2021-09-04 NOTE — H&P (Signed)
Chief Complaint: Patient was seen in consultation today for diagnostic cerebral angiogram at the request of [place] D[place] Rodrigues,[place]  Referring Physician(s): [place] Mill  Supervising Physician: Pedro Earls  Patient Status: [facility] - Out-pt  History of Present Illness: Melody Brooks is a 65 y.o. 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  Images were reviewed with Dr. Debbrah Alar with findings and possible vascular causes of pulsatile tinnitus such as dural AV fistula, sinus diverticulum and stenosis. There was also discussion of possible brain aneurysms, including aneurysmal subarachnoid hemorrhage and natural history of brain aneurysms.  She was instructed to call 911 should she developed any signs or symptoms suspicious for a ruptured brain aneurysm.  Dr. Debbrah Alar informed pt that given the aneurysm size, risk of subarachnoid hemorrhage is low.  It was suggested to perform a diagnostic cerebral angiogram to better evaluate the aneurysm and to investigate for vascular causes of pulsatile tinnitus.  Risks and benefits were discussed. Pt presents today for diagnostic cerebral angiogram. Past Medical History:  Diagnosis Date   Anemia    Angioedema    Arthritis    Cancer ([place])    Constipation    Diabetes mellitus    Patient takes Janumet and Actos.   Fatigue    Fibromuscular dysplasia (H[facility])    Generalized headaches    GERD (gastroesophageal reflux disease)    Hemorrhoids    Hiatal hernia    Hypertension    Neuropathy    Palpitations    Rectal bleeding    Rectal pain     Past Surgical History:  Procedure Laterality Date   Basal cell cancer  removal     colonoscopy  2010   EXCISIONAL HEMORRHOIDECTOMY  03/2012   right and left eye cataract surgery  2006, 2008   right in 2006 and left 2008   TUBAL LIGATION  1995    Allergies: Lisinopril  Medications: Prior to Admission medications   Medication Sig  Start Date End Date Taking? Authorizing Provider  Alcohol Swabs 70 % PADS 1 each by Does not apply route 2 (two) times daily. 10/24/19   Chrismon, Vickki Muff, PA-C  Alcohol Swabs PADS Used to check blood sugars 3-4 times a day. Dx E11.9. 01/02/16   Margarita Rana, MD  aspirin 81 MG tablet Take 81 mg by mouth daily.    [provider]  Blood Glucose Monitoring Suppl (ONE TOUCH ULTRA MINI) w/Device KIT 1 each by Does not apply route daily. 09/13/19   Chrismon, Vickki Muff, PA-C  Cholecalciferol (DIALYVITE VITAMIN D 5000) 125 MCG (5000 UT) capsule Take 5,000 Units by mouth daily.    [provider]  cyclobenzaprine (FLEXERIL) 10 MG tablet Take 10 mg by mouth at bedtime.    [provider]  diphenhydrAMINE (BENADRYL) 25 MG tablet Take 25 mg by mouth 2 (two) times daily.    [provider]  DULoxetine (CYMBALTA) 30 MG capsule Take 30 mg by mouth daily. 03/11/21   [provider]  EPIPEN 2-PAK 0.3 MG/0.3ML SOAJ injection Inject 0.3 mg into the muscle as needed for anaphylaxis. 10/05/15   [provider]  esomeprazole (NEXIUM) 20 MG capsule Take 40 mg by mouth daily at 12 noon.    [provider]  estrogen, conjugated,-medroxyprogesterone (PREMPRO) 0.3-1.5 MG tablet Take 1 tablet by mouth daily.    [provider]  famotidine (PEPCID) 20 MG tablet Take 20 mg by mouth at bedtime.    [provider]  fluticasone-salmeterol (ADVAIR DISKUS) 250-50 MCG/ACT AEPB Inhale 1 puff into the lungs 2 (two) times daily as needed (chest congestion).    [provider]  glipiZIDE (GLUCOTROL XL) 2.5 MG 24 hr tablet TAKE 1 TABLET BY MOUTH  DAILY WITH BREAKFAST 04/21/20   Chrismon, Simona Huh E, PA-C  glucose blood (CONTOUR NEXT TEST) test strip TEST TWICE DAILY 08/28/20   Chrismon, Vickki Muff, PA-C  hydrochlorothiazide (MICROZIDE) 12.5 MG capsule TAKE 1 CAPSULE BY MOUTH  DAILY 04/21/20   Chrismon, Vickki Muff, PA-C  ibuprofen (ADVIL,MOTRIN) 200 MG tablet  Take 600-800 mg by mouth daily as needed for moderate pain. 04/23/10   [provider]  JANUMET 50-1000 MG tablet TAKE 1 TABLET BY MOUTH  TWICE DAILY 04/21/20   Chrismon, Vickki Muff, PA-C  Lancets (ONETOUCH ULTRASOFT) lancets USE TO TEST ONCE DAILY 01/25/20   Chrismon, Vickki Muff, PA-C  LORazepam (ATIVAN) 2 MG tablet TAKE 1 TABLET BY MOUTH  EVERY 8 HOURS AS NEEDED Patient taking differently: Take 2 mg by mouth at bedtime. 08/04/20   Chrismon, Vickki Muff, PA-C  losartan (COZAAR) 50 MG tablet TAKE 1 TABLET BY MOUTH  DAILY 04/21/20   Chrismon, Vickki Muff, PA-C  ONETOUCH ULTRA test strip USE TO CHECK BLOOD SUGAR  (FASTING) EVERY MORNING.  TEST UP TO 4 TIMES DAILY IF HAVING HYPOGLYCEMIA. 03/27/19   Chrismon, Simona Huh E, PA-C  pioglitazone (ACTOS) 45 MG tablet TAKE 1 TABLET BY MOUTH AT  BEDTIME 04/11/20   Chrismon, Vickki Muff, PA-C  predniSONE (DELTASONE) 10 MG tablet Take 10 mg by mouth 3 (three) times daily as needed (hives/itching).    [provider]  pregabalin (LYRICA) 100 MG capsule Take 1 capsule (100 mg total) by mouth 3 (three) times daily. 07/28/20   Chrismon, Vickki Muff, PA-C  rosuvastatin (CRESTOR) 10 MG tablet Take 10 mg by mouth daily. 03/11/21   [provider]  zinc gluconate 50 MG tablet Take 50 mg by mouth daily.    [provider]  zolpidem (AMBIEN) 5 MG tablet Take 1 tablet (5 mg total) by mouth at bedtime as needed. (for peripheral neuropathy) Patient taking differently: Take 5 mg by mouth at bedtime. (for peripheral neuropathy) 11/05/20   Chrismon, Vickki Muff, PA-C     Family History  Problem Relation Age of Onset   Heart failure Mother    COPD Mother    Hypertension Mother    Arthritis Mother    Cancer Father        prostate   Parkinson's disease Father    Colon cancer Father    Cancer Maternal Aunt        colon   Cancer Paternal Grandmother        stomach   Diabetes Brother    Diabetes Brother     Social History   Socioeconomic History   Marital  status: Married    Spouse name: Not on file   Number of children: Not on file   Years of education: Not on file   Highest education level: Not on file  Occupational History   Not on file  Tobacco Use   Smoking status: Never   Smokeless tobacco: Never   Tobacco comments:    tobacco use- no  Substance and Sexual Activity   Alcohol use: No   Drug use: No   Sexual activity: Not on file  Other Topics Concern   Not on file  Social History Narrative   Full time. Married. Regularly exercises- walks.    Social  Determinants of Health   Financial Resource Strain: Not on file  Food Insecurity: Not on file  Transportation Needs: Not on file  Physical Activity: Not on file  Stress: Not on file  Social Connections: Not on file    Review of Systems: A 12 point ROS discussed and pertinent positives are indicated in the HPI above.  All other systems are negative.  Review of Systems  Constitutional:  Negative for appetite change, chills and fever.  HENT:  Negative for nosebleeds.   Respiratory:  Negative for cough and shortness of breath.   Cardiovascular:  Negative for chest pain, palpitations and leg swelling.  Gastrointestinal:  Negative for abdominal pain, blood in stool, nausea and vomiting.  Genitourinary:  Negative for hematuria.  Neurological:  Positive for dizziness. Negative for light-headedness and headaches.       Intermittent dizziness when she stands quickly   Vital Signs: BP 131/75   Pulse 84   Temp 97.8 F (36.6 C) (Oral)   Resp 15   Ht 5' 8"  (1.727 m)   Wt 252 lb (114.3 kg)   LMP 03/26/2012 (Approximate)   SpO2 100%   BMI 38.32 kg/m   Physical Exam Vitals reviewed.  Constitutional:      Appearance: She is not ill-appearing.  HENT:     Head: Normocephalic and atraumatic.     Mouth/Throat:     Mouth: Mucous membranes are dry.     Pharynx: Oropharynx is clear.  Eyes:     Pupils: Pupils are equal, round, and reactive to light.  Cardiovascular:     Rate  and Rhythm: Normal rate and regular rhythm.     Pulses: Normal pulses.     Heart sounds: Normal heart sounds. No murmur heard.   No gallop.  Pulmonary:     Effort: Pulmonary effort is normal. No respiratory distress.     Breath sounds: Normal breath sounds. No stridor. No wheezing, rhonchi or rales.  Abdominal:     General: Bowel sounds are normal. There is no distension.     Palpations: Abdomen is soft.     Tenderness: There is no abdominal tenderness. There is no guarding.  Musculoskeletal:     Right lower leg: No edema.     Left lower leg: No edema.  Skin:    General: Skin is warm and dry.  Neurological:     Mental Status: She is alert and oriented to person, place, and time.     Comments: Alert, aware and oriented X 3 Speech and comprehension is intact.  PERRL bilaterally No facial droop noted Tongue midline Can spontaneously move all 4 extremities. Hand grip strength equal bilaterally. Dorsiflexion 5/5 bilaterally. Plantar flection 5/5 bilaterally.          Psychiatric:        Mood and Affect: Mood normal.        Behavior: Behavior normal.        Thought Content: Thought content normal.        Judgment: Judgment normal.    Imaging: MR ANGIO HEAD WO CONTRAST  Result Date: 08/21/2021 CLINICAL DATA:  Pulsatile tinnitus of left ear. Additional history provided by scanning technologist: Patient reports pulsatile tinnitus and left ear intermittently for several years, constant over the past 2-3 months. EXAM: MRI HEAD WITHOUT AND WITH CONTRAST MRA HEAD WITHOUT CONTRAST TECHNIQUE: Multiplanar, multi-echo pulse sequences of the brain and surrounding structures were acquired without and with intravenous contrast. Angiographic images of the Circle of Willis were acquired using  MRA technique without intravenous contrast. CONTRAST:  53m GADAVIST GADOBUTROL 1 MMOL/ML IV SOLN COMPARISON:  No pertinent prior exams available for comparison. FINDINGS: MRI HEAD FINDINGS Brain: Mild  generalized cerebral atrophy. There are two foci of T2 FLAIR hyperintense signal abnormality within the left frontoparietal white matter measuring up to 8 mm, one in the left periventricular white matter and the other in the subcortical white matter (series 14, images 34 and 33). No evidence of an intracranial mass. Specifically, no cerebellopontine angle or internal auditory canal mass is demonstrated. Unremarkable appearance of the 7th and 8th cranial nerves bilaterally. Thinned appearance of the bony coverings of the superior semicircular canals bilaterally. There is no acute infarct. No chronic intracranial blood products. No extra-axial fluid collection. No midline shift. Partially empty sella turcica. No pathologic intracranial enhancement identified. Vascular: Maintained flow voids within the proximal large arterial vessels. The left jugular bulb is slightly high in position as compared to the right. Skull and upper cervical spine: No focal suspicious marrow lesion. Sinuses/Orbits: Visualized orbits show no acute finding. Bilateral lens replacements. Trace mucosal thickening within the bilateral ethmoid and right maxillary sinuses. Other: Small bilateral mastoid effusions. MRA HEAD FINDINGS Anterior circulation: The intracranial internal carotid arteries are patent. The M1 middle cerebral arteries are patent. No M2 proximal branch occlusion or high-grade proximal stenosis is identified. The anterior cerebral arteries are patent. 1 mm inferiorly projecting infundibulum arising from the cavernous left ICA. Posterior circulation: The intracranial vertebral arteries are patent. The basilar artery is patent. The posterior cerebral arteries are patent. Fetal origin right posterior cerebral artery. The left posterior communicating artery is diminutive or absent. 1-2 mm anteriorly projecting vascular protrusion arising from the left posterior cerebral artery at the P1/P2 junction, which may reflect a small aneurysm  (series 1, image 91). Anatomic variants: As described. IMPRESSION: MRI brain: 1. No evidence of acute intracranial abnormality. 2. Thinned appearance of the bony coverings of the superior semicircular canals, bilaterally. Consider a non-contrast CT of the temporal bones to better assess for superior semicircular canal dehiscence. 3. The left jugular bulb is slightly high in position as compared to the right. 4. Two foci of T2 FLAIR hyperintense signal abnormality within the left frontoparietal white matter, measuring up to 8 mm. These signal changes are nonspecific, but most often secondary to chronic small vessel ischemia. Alternative considerations include sequela of a prior infectious/inflammatory process, sequela of chronic migraine headaches or sequela of a demyelinating process (such as multiple sclerosis). 5. Mild generalized cerebral atrophy. 6. Small bilateral mastoid effusions. MRA head: 1. No intracranial large vessel occlusion or proximal high-grade arterial stenosis. 2. No vascular cause of pulsatile tinnitus is identified. 3. 1-2 mm anteriorly projecting vascular protrusion arising from the left posterior cerebral artery at the P1/P2 junction, which may reflect a small aneurysm. Electronically Signed   By: KKellie SimmeringD.O.   On: 08/21/2021 16:28   MR BRAIN/IAC W WO CONTRAST  Result Date: 08/21/2021 CLINICAL DATA:  Pulsatile tinnitus of left ear. Additional history provided by scanning technologist: Patient reports pulsatile tinnitus and left ear intermittently for several years, constant over the past 2-3 months. EXAM: MRI HEAD WITHOUT AND WITH CONTRAST MRA HEAD WITHOUT CONTRAST TECHNIQUE: Multiplanar, multi-echo pulse sequences of the brain and surrounding structures were acquired without and with intravenous contrast. Angiographic images of the Circle of Willis were acquired using MRA technique without intravenous contrast. CONTRAST:  131mGADAVIST GADOBUTROL 1 MMOL/ML IV SOLN COMPARISON:  No  pertinent prior exams available for comparison.  FINDINGS: MRI HEAD FINDINGS Brain: Mild generalized cerebral atrophy. There are two foci of T2 FLAIR hyperintense signal abnormality within the left frontoparietal white matter measuring up to 8 mm, one in the left periventricular white matter and the other in the subcortical white matter (series 14, images 34 and 33). No evidence of an intracranial mass. Specifically, no cerebellopontine angle or internal auditory canal mass is demonstrated. Unremarkable appearance of the 7th and 8th cranial nerves bilaterally. Thinned appearance of the bony coverings of the superior semicircular canals bilaterally. There is no acute infarct. No chronic intracranial blood products. No extra-axial fluid collection. No midline shift. Partially empty sella turcica. No pathologic intracranial enhancement identified. Vascular: Maintained flow voids within the proximal large arterial vessels. The left jugular bulb is slightly high in position as compared to the right. Skull and upper cervical spine: No focal suspicious marrow lesion. Sinuses/Orbits: Visualized orbits show no acute finding. Bilateral lens replacements. Trace mucosal thickening within the bilateral ethmoid and right maxillary sinuses. Other: Small bilateral mastoid effusions. MRA HEAD FINDINGS Anterior circulation: The intracranial internal carotid arteries are patent. The M1 middle cerebral arteries are patent. No M2 proximal branch occlusion or high-grade proximal stenosis is identified. The anterior cerebral arteries are patent. 1 mm inferiorly projecting infundibulum arising from the cavernous left ICA. Posterior circulation: The intracranial vertebral arteries are patent. The basilar artery is patent. The posterior cerebral arteries are patent. Fetal origin right posterior cerebral artery. The left posterior communicating artery is diminutive or absent. 1-2 mm anteriorly projecting vascular protrusion arising from the  left posterior cerebral artery at the P1/P2 junction, which may reflect a small aneurysm (series 1, image 91). Anatomic variants: As described. IMPRESSION: MRI brain: 1. No evidence of acute intracranial abnormality. 2. Thinned appearance of the bony coverings of the superior semicircular canals, bilaterally. Consider a non-contrast CT of the temporal bones to better assess for superior semicircular canal dehiscence. 3. The left jugular bulb is slightly high in position as compared to the right. 4. Two foci of T2 FLAIR hyperintense signal abnormality within the left frontoparietal white matter, measuring up to 8 mm. These signal changes are nonspecific, but most often secondary to chronic small vessel ischemia. Alternative considerations include sequela of a prior infectious/inflammatory process, sequela of chronic migraine headaches or sequela of a demyelinating process (such as multiple sclerosis). 5. Mild generalized cerebral atrophy. 6. Small bilateral mastoid effusions. MRA head: 1. No intracranial large vessel occlusion or proximal high-grade arterial stenosis. 2. No vascular cause of pulsatile tinnitus is identified. 3. 1-2 mm anteriorly projecting vascular protrusion arising from the left posterior cerebral artery at the P1/P2 junction, which may reflect a small aneurysm. Electronically Signed   By: Kellie Simmering D.O.   On: 08/21/2021 16:28    Labs:  CBC: Recent Labs    09/10/20 1100 09/04/21 0914  WBC 7.1 7.9  HGB 11.4 11.3*  HCT 33.8* 35.0*  PLT 268 231    COAGS: Recent Labs    09/04/21 0914  INR 1.0    BMP: Recent Labs    09/10/20 1100 09/04/21 0914  NA 139 139  K 4.5 4.7  CL 102 104  CO2 25 28  GLUCOSE 98 139*  BUN 20 20  CALCIUM 9.4 9.3  CREATININE 1.02* 1.20*  GFRNONAA 58* 50*  GFRAA 67  --     LIVER FUNCTION TESTS: Recent Labs    09/10/20 1100  BILITOT 0.4  AST 18  ALT 17  ALKPHOS 78  PROT 6.4  ALBUMIN 4.2    TUMOR MARKERS: No results for input(s):  AFPTM, CEA, CA199, CHROMGRNA in the last 8760 hours.  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  Images were reviewed with Dr. Debbrah Alar with findings and possible vascular causes of pulsatile tinnitus such as dural AV fistula, sinus diverticulum and stenosis. There was also discussion of possible brain aneurysms, including aneurysmal subarachnoid hemorrhage and natural history of brain aneurysms.  She was instructed to call 911 should she developed any signs or symptoms suspicious for a ruptured brain aneurysm.  Dr. Debbrah Alar informed pt that given the aneurysm size, risk of subarachnoid hemorrhage is low.  It was suggested to perform a diagnostic cerebral angiogram to better evaluate the aneurysm and to investigate for vascular causes of pulsatile tinnitus. Pt presents today for diagnostic cerebral angiogram at the request of Dr. Debbrah Alar.   Pt is A&O, calm and pleasant. She is in no distress.  She states she is NPO per order.  Creat 1.20, GFR 50 today VSS  Risks and benefits of cerebral angiogram with intervention were discussed with the patient including, but not limited to bleeding, infection, vascular injury, contrast induced renal failure, stroke or even death.  This interventional procedure involves the use of X-rays and because of the nature of the planned procedure, it is possible that we will have prolonged use of X-ray fluoroscopy.  Potential radiation risks to you include (but are not limited to) the following: - A slightly elevated risk for cancer  several years later in life. This risk is typically less than 0.5% percent. This risk is low in comparison to the normal incidence of human cancer, which is 33% for women and 50% for men according to the [place]. - Radiation induced injury can include skin redness, resembling a rash, tissue breakdown / ulcers and hair loss (which can be temporary or permanent).   The  likelihood of either of these occurring depends on the difficulty of the procedure and whether you are sensitive to radiation due to previous procedures, disease, or genetic conditions.   IF your procedure requires a prolonged use of radiation, you will be notified and given written instructions for further action.  It is your responsibility to monitor the irradiated area for the 2 weeks following the procedure and to notify your physician if you are concerned that you have suffered a radiation induced injury.    All of the patient's questions were answered, patient is agreeable to proceed.  Consent signed and in chart.  Thank you for this interesting consult.  I greatly enjoyed meeting Melody Brooks and look forward to participating in their care.  A copy of this report was sent to the requesting provider on this date.  Electronically Signed: Tyson Alias, NP 09/04/2021, 10:43 AM   I spent a total of 30 Minutes  in face to face in clinical consultation, greater than 50% of which was counseling/coordinating care for diagnostic cerebral angiogram.

## 2022-01-08 ENCOUNTER — Encounter (INDEPENDENT_AMBULATORY_CARE_PROVIDER_SITE_OTHER): Payer: Self-pay

## 2022-01-08 ENCOUNTER — Encounter (INDEPENDENT_AMBULATORY_CARE_PROVIDER_SITE_OTHER): Payer: Managed Care, Other (non HMO)

## 2022-01-08 ENCOUNTER — Ambulatory Visit (INDEPENDENT_AMBULATORY_CARE_PROVIDER_SITE_OTHER): Payer: Managed Care, Other (non HMO) | Admitting: Vascular Surgery

## 2022-04-09 ENCOUNTER — Other Ambulatory Visit: Payer: Self-pay | Admitting: Orthopedic Surgery

## 2022-04-09 DIAGNOSIS — M7061 Trochanteric bursitis, right hip: Secondary | ICD-10-CM

## 2022-04-19 ENCOUNTER — Ambulatory Visit
Admission: RE | Admit: 2022-04-19 | Discharge: 2022-04-19 | Disposition: A | Discharge status: Home / Self Care | Payer: Medicare HMO | Source: Ambulatory Visit | Attending: Orthopedic Surgery | Admitting: Orthopedic Surgery

## 2022-04-19 DIAGNOSIS — M7061 Trochanteric bursitis, right hip: Secondary | ICD-10-CM | POA: Diagnosis not present

## 2022-08-30 ENCOUNTER — Encounter (INDEPENDENT_AMBULATORY_CARE_PROVIDER_SITE_OTHER): Payer: Self-pay

## 2022-12-02 ENCOUNTER — Telehealth (HOSPITAL_COMMUNITY): Payer: Self-pay

## 2022-12-02 NOTE — Telephone Encounter (Signed)
Called to schedule cta head/neck, no answer, left vm. AB  

## 2022-12-10 ENCOUNTER — Other Ambulatory Visit: Payer: Self-pay | Admitting: Surgery

## 2022-12-14 ENCOUNTER — Other Ambulatory Visit: Payer: Medicare HMO

## 2022-12-16 ENCOUNTER — Encounter
Admission: RE | Admit: 2022-12-16 | Discharge: 2022-12-16 | Disposition: A | Discharge status: Home / Self Care | Payer: Medicare Other | Source: Ambulatory Visit | Attending: Surgery | Admitting: Surgery

## 2022-12-16 ENCOUNTER — Other Ambulatory Visit: Payer: Self-pay

## 2022-12-16 DIAGNOSIS — Z01812 Encounter for preprocedural laboratory examination: Secondary | ICD-10-CM

## 2022-12-16 HISTORY — DX: Pneumonia, unspecified organism: J18.9

## 2022-12-16 NOTE — Patient Instructions (Addendum)
Your procedure is scheduled on: 12/23/22 - Thursday Report to the Registration Desk on the 1st floor of the [facility]. To find out your arrival time, please call [phone] between 1PM - 3PM on: 12/22/22- Wednesday If your arrival time is 6:00 am, do not arrive before that time as the [facility] entrance doors do not open until 6:00 am.  REMEMBER: Instructions that are not followed completely may result in serious medical risk, up to and including death; or upon the discretion of your surgeon and anesthesiologist your surgery may need to be rescheduled.  Do not eat food after midnight the night before surgery.  No gum chewing or hard candies.  You may however, drink CLEAR liquids up to 2 hours before you are scheduled to arrive for your surgery. Do not drink anything within 2 hours of your scheduled arrival time.  Clear liquids include: - water   In addition, your doctor has ordered for you to drink the provided:  Gatorade G2 Drinking this carbohydrate drink up to two hours before surgery helps to reduce insulin resistance and improve patient outcomes. Please complete drinking 2 hours before scheduled arrival time.  Stop beginning 12/16/22 all Anti-inflammatories (NSAIDS) such as Advil, Aleve, Ibuprofen, Motrin, Naproxen, Naprosyn and Aspirin based products such as Excedrin, Goody's Powder, BC Powder. - You may continue your ASPIRIN, but do not take the morning of surgery.  Stop ANY OVER THE COUNTER supplements until after surgery beginning 12/16/22: zinc gluconate , DIALYVITE VITAMIN D 5000, magnesium oxide .  You may take Tylenol if needed for pain up until the day of surgery.  Continue taking all prescribed medications with the exception of the following:  HOLD JANUMET beginning 12/21/22, may resume after surgery. HOLD Semaglutide (OZEMPIC) 7 days prior to your surgery.   TAKE ONLY THESE MEDICATIONS THE MORNING OF SURGERY WITH A SIP OF WATER:  esomeprazole (NEXIUM)   PREMPRO  pregabalin (LYRICA)  propranolol ER (INDERAL LA)    No Alcohol for 24 hours before or after surgery.  No Smoking including e-cigarettes for 24 hours before surgery.  No chewable tobacco products for at least 6 hours before surgery.  No nicotine patches on the day of surgery.  Do not use any "recreational" drugs for at least a week (preferably 2 weeks) before your surgery.  Please be advised that the combination of cocaine and anesthesia may have negative outcomes, up to and including death. If you test positive for cocaine, your surgery will be cancelled.  On the morning of surgery brush your teeth with toothpaste and water, you may rinse your mouth with mouthwash if you wish. Do not swallow any toothpaste or mouthwash.  Use CHG Soap or wipes as directed on instruction sheet.  Do not wear jewelry, make-up, hairpins, clips or nail polish.  Do not wear lotions, powders, or perfumes.   Do not shave body hair from the neck down 48 hours before surgery.  Contact lenses, hearing aids and dentures may not be worn into surgery.  Do not bring valuables to the hospital. [facility] is not responsible for any missing/lost belongings or valuables.    Notify your doctor if there is any change in your medical condition (cold, fever, infection).  Wear comfortable clothing (specific to your surgery type) to the hospital.  After surgery, you can help prevent lung complications by doing breathing exercises.  Take deep breaths and cough every 1-2 hours. Your doctor may order a device called an Incentive Spirometer to help you take  deep breaths. When coughing or sneezing, hold a pillow firmly against your incision with both hands. This is called "splinting." Doing this helps protect your incision. It also decreases belly discomfort.  If you are being admitted to the hospital overnight, leave your suitcase in the car. After surgery it may be brought to your room.  In case of increased  patient census, it may be necessary for you, the patient, to continue your postoperative care in the Same Day Surgery department.  If you are being discharged the day of surgery, you will not be allowed to drive home. You will need a responsible individual to drive you home and stay with you for 24 hours after surgery.   If you are taking public transportation, you will need to have a responsible individual with you.  Please call the [place] Dept. at [phone] if you have any questions about these instructions.  Surgery Visitation Policy:  Patients undergoing a surgery or procedure may have two family members or support persons with them as long as the person is not COVID-19 positive or experiencing its symptoms.   Inpatient Visitation:    Visiting hours are 7 a.m. to 8 p.m. Up to four visitors are allowed at one time in a patient room. The visitors may rotate out with other people during the day. One designated support person (adult) may remain overnight.  Due to an increase in RSV and influenza rates and associated hospitalizations, children ages 66 and under will not be able to visit patients in [facility].  Masks continue to be strongly recommended.     Preparing for Surgery with CHLORHEXIDINE GLUCONATE (CHG) Soap  Chlorhexidine Gluconate (CHG) Soap  o An antiseptic cleaner that kills germs and bonds with the skin to continue killing germs even after washing  o Used for showering the night before surgery and morning of surgery  Before surgery, you can play an important role by reducing the number of germs on your skin.  CHG (Chlorhexidine gluconate) soap is an antiseptic cleanser which kills germs and bonds with the skin to continue killing germs even after washing.  Please do not use if you have an allergy to CHG or antibacterial soaps. If your skin becomes reddened/irritated stop using the CHG.  1. Shower the NIGHT BEFORE SURGERY and the  MORNING OF SURGERY with CHG soap.  2. If you choose to wash your hair, wash your hair first as usual with your normal shampoo.  3. After shampooing, rinse your hair and body thoroughly to remove the shampoo.  4. Use CHG as you would any other liquid soap. You can apply CHG directly to the skin and wash gently with a scrungie or a clean washcloth.  5. Apply the CHG soap to your body only from the neck down. Do not use on open wounds or open sores. Avoid contact with your eyes, ears, mouth, and genitals (private parts). Wash face and genitals (private parts) with your normal soap.  6. Wash thoroughly, paying special attention to the area where your surgery will be performed.  7. Thoroughly rinse your body with warm water.  8. Do not shower/wash with your normal soap after using and rinsing off the CHG soap.  9. Pat yourself dry with a clean towel.  10. Wear clean pajamas to bed the night before surgery.  12. Place clean sheets on your bed the night of your first shower and do not sleep with pets.  13. Shower again with the CHG  soap on the day of surgery prior to arriving at the hospital.  14. Do not apply any deodorants/lotions/powders.  15. Please wear clean clothes to the hospital.

## 2022-12-17 ENCOUNTER — Encounter
Admission: RE | Admit: 2022-12-17 | Discharge: 2022-12-17 | Disposition: A | Discharge status: Home / Self Care | Payer: Medicare Other | Source: Ambulatory Visit | Attending: Surgery | Admitting: Surgery

## 2022-12-17 DIAGNOSIS — I4510 Unspecified right bundle-branch block: Secondary | ICD-10-CM | POA: Insufficient documentation

## 2022-12-17 DIAGNOSIS — Z01812 Encounter for preprocedural laboratory examination: Secondary | ICD-10-CM | POA: Diagnosis present

## 2022-12-17 NOTE — Progress Notes (Signed)
  Perioperative Services Pre-Admission/Anesthesia Testing    Date: 12/17/22  Name: Melody Brooks MRN:   [id number]  Re: GLP-1 clearance and provider recommendations   Planned Surgical Procedure(s):    Case: [id number] Date/Time: 12/23/22 1006   Procedure: OPEN EXCISION OF RIGHT TROCHANTERIC BURSA (Right: Hip)   Anesthesia type: Choice   Pre-op diagnosis: Trochanteric bursitis of right hip M70.61   Location: [facility] OR ROOM 03 / [place] ORS FOR ANESTHESIA GROUP   Surgeons: Corky Mull, MD   Clinical Notes:  Patient is scheduled for the above procedure with the indicated provider/surgeon. In review of her medication reconciliation it was noted that patient is on a prescribed GLP-1 medication. Per guidelines issued by the American Society of Anesthesiologists (ASA), it is recommended that these medications be held for 7 days prior to the patient undergoing any type of elective surgical procedure. The patient is taking the following GLP-1 medication:  [x]$  SEMAGLUTIDE   []$  EXENATIDE  []$  LIRAGLUTIDE   []$  LIXISENATIDE  []$  DULAGLUTIDE     []$  OTHER GLP-1 medication: _______________  Reached out to prescribing provider Doy Hutching, MD) to make them aware of the guidelines from anesthesia. Given that this patient takes the prescribed GLP-1 medication for her  diabetes diagnosis, rather than for weight loss, recommendations from the prescribing provider were solicited. Prescribing provider made aware of the following so that informed decision/POC can be developed for this patient that may be taking medications belonging to these drug classes:  Oral GLP-1 medications will be held 1 day prior to surgery.  Injectable GLP-1 medications will be held 7 days prior to surgery.  Metformin is routinely held 48 hours prior to surgery due to renal concerns, potential need for contrasted imaging perioperatively, and the potential for tissue hypoxia leading to drug induced lactic acidosis.  All SGLT2i medications  are held 72 hours prior to surgery as they can be associated with the increased potential for developing euglycemic diabetic ketoacidosis (EDKA).   Impression and Plan:  Melody Brooks is on a prescribed GLP-1 medication, which induces the known side effect of decreased gastric emptying. Efforts are bring made to mitigate the risk of perioperative hyperglycemic events, as elevated blood glucose levels have been found to contribute to intra/postoperative complications. Additionally, hyperglycemic extremes can potentially necessitate the postponing of a patient's elective case in order to better optimize perioperative glycemic control, again with the aforementioned guidelines in place. With this in mind, recommendations have been sought from the prescribing provider, who has cleared patient to proceed with holding the prescribed GLP-1 as per the guidelines from the ASA.   Provider recommending: no further recommendations received from the prescribing provider.  Copy of signed clearance and recommendations placed on patient's chart for inclusion in their medical record and for review by the surgical/anesthetic team on the day of her procedure.   Honor Loh, MSN, APRN, FNP-C, CEN [facility]  Peri-operative Services Nurse Practitioner Phone: [phone] 12/17/22 1:34 PM  NOTE: This note has been prepared using Dragon dictation software. Despite my best ability to proofread, there is always the potential that unintentional transcriptional errors may still occur from this process.

## 2022-12-23 ENCOUNTER — Other Ambulatory Visit: Payer: Self-pay

## 2022-12-23 ENCOUNTER — Ambulatory Visit
Admission: RE | Admit: 2022-12-23 | Discharge: 2022-12-23 | Disposition: A | Discharge status: Home / Self Care | Payer: Medicare Other | Attending: Surgery | Admitting: Surgery

## 2022-12-23 ENCOUNTER — Encounter: Payer: Self-pay | Admitting: Surgery

## 2022-12-23 ENCOUNTER — Encounter
Admission: RE | Disposition: A | Discharge status: Home / Self Care | Payer: Self-pay | Source: Home / Self Care | Attending: Surgery

## 2022-12-23 ENCOUNTER — Ambulatory Visit: Payer: Medicare Other | Admitting: Urgent Care

## 2022-12-23 DIAGNOSIS — Z7985 Long-term (current) use of injectable non-insulin antidiabetic drugs: Secondary | ICD-10-CM | POA: Diagnosis not present

## 2022-12-23 DIAGNOSIS — E1142 Type 2 diabetes mellitus with diabetic polyneuropathy: Secondary | ICD-10-CM

## 2022-12-23 DIAGNOSIS — Z01812 Encounter for preprocedural laboratory examination: Secondary | ICD-10-CM

## 2022-12-23 DIAGNOSIS — Z7984 Long term (current) use of oral hypoglycemic drugs: Secondary | ICD-10-CM | POA: Insufficient documentation

## 2022-12-23 DIAGNOSIS — K219 Gastro-esophageal reflux disease without esophagitis: Secondary | ICD-10-CM | POA: Insufficient documentation

## 2022-12-23 DIAGNOSIS — Z6838 Body mass index (BMI) 38.0-38.9, adult: Secondary | ICD-10-CM | POA: Insufficient documentation

## 2022-12-23 DIAGNOSIS — E1165 Type 2 diabetes mellitus with hyperglycemia: Secondary | ICD-10-CM | POA: Insufficient documentation

## 2022-12-23 DIAGNOSIS — M7061 Trochanteric bursitis, right hip: Secondary | ICD-10-CM | POA: Insufficient documentation

## 2022-12-23 DIAGNOSIS — G4700 Insomnia, unspecified: Secondary | ICD-10-CM

## 2022-12-23 HISTORY — PX: EXCISION/RELEASE BURSA HIP: SHX5014

## 2022-12-23 HISTORY — DX: Aneurysm of unspecified site: I72.9

## 2022-12-23 LAB — GLUCOSE, CAPILLARY
Glucose-Capillary: 102 mg/dL — ABNORMAL HIGH (ref 70–99)
Glucose-Capillary: 159 mg/dL — ABNORMAL HIGH (ref 70–99)

## 2022-12-23 SURGERY — RELEASE, BURSA, TROCHANTERIC
Anesthesia: General | Site: Hip | Laterality: Right

## 2022-12-23 MED ORDER — ONDANSETRON HCL 4 MG/2ML IJ SOLN: 4.0000 mg | Freq: Once | INTRAMUSCULAR | Status: DC | PRN

## 2022-12-23 MED ORDER — KETOROLAC TROMETHAMINE 15 MG/ML IJ SOLN
INTRAMUSCULAR | Status: AC
  Administered 2022-12-23: 15 mg via INTRAVENOUS
  Filled 2022-12-23: qty 1

## 2022-12-23 MED ORDER — SUCCINYLCHOLINE CHLORIDE 200 MG/10ML IV SOSY
PREFILLED_SYRINGE | INTRAVENOUS | Status: DC | PRN
  Administered 2022-12-23: 120 mg via INTRAVENOUS

## 2022-12-23 MED ORDER — ROCURONIUM BROMIDE 10 MG/ML (PF) SYRINGE
PREFILLED_SYRINGE | INTRAVENOUS | Status: AC
  Filled 2022-12-23: qty 10

## 2022-12-23 MED ORDER — FENTANYL CITRATE (PF) 100 MCG/2ML IJ SOLN
INTRAMUSCULAR | Status: AC
  Filled 2022-12-23: qty 2

## 2022-12-23 MED ORDER — ONDANSETRON HCL 4 MG/2ML IJ SOLN
INTRAMUSCULAR | Status: AC
  Filled 2022-12-23: qty 2

## 2022-12-23 MED ORDER — ONDANSETRON HCL 4 MG/2ML IJ SOLN
INTRAMUSCULAR | Status: DC | PRN
  Administered 2022-12-23: 4 mg via INTRAVENOUS

## 2022-12-23 MED ORDER — SODIUM CHLORIDE 0.9 % IV SOLN: INTRAVENOUS | Status: DC

## 2022-12-23 MED ORDER — TRANEXAMIC ACID 1000 MG/10ML IV SOLN
INTRAVENOUS | Status: AC
  Filled 2022-12-23: qty 10

## 2022-12-23 MED ORDER — SUCCINYLCHOLINE CHLORIDE 200 MG/10ML IV SOSY
PREFILLED_SYRINGE | INTRAVENOUS | Status: AC
  Filled 2022-12-23: qty 10

## 2022-12-23 MED ORDER — METOCLOPRAMIDE HCL 5 MG/ML IJ SOLN: 5.0000 mg | Freq: Three times a day (TID) | INTRAMUSCULAR | Status: DC | PRN

## 2022-12-23 MED ORDER — ACETAMINOPHEN 10 MG/ML IV SOLN: 1000.0000 mg | Freq: Once | INTRAVENOUS | Status: DC | PRN

## 2022-12-23 MED ORDER — PROPOFOL 10 MG/ML IV BOLUS
INTRAVENOUS | Status: DC | PRN
  Administered 2022-12-23: 140 mg via INTRAVENOUS

## 2022-12-23 MED ORDER — CEFAZOLIN SODIUM-DEXTROSE 2-4 GM/100ML-% IV SOLN
INTRAVENOUS | Status: AC
  Filled 2022-12-23: qty 100

## 2022-12-23 MED ORDER — METOCLOPRAMIDE HCL 10 MG PO TABS: 5.0000 mg | ORAL_TABLET | Freq: Three times a day (TID) | ORAL | Status: DC | PRN

## 2022-12-23 MED ORDER — ROCURONIUM BROMIDE 100 MG/10ML IV SOLN
INTRAVENOUS | Status: DC | PRN
  Administered 2022-12-23: 40 mg via INTRAVENOUS

## 2022-12-23 MED ORDER — PHENYLEPHRINE 80 MCG/ML (10ML) SYRINGE FOR IV PUSH (FOR BLOOD PRESSURE SUPPORT)
PREFILLED_SYRINGE | INTRAVENOUS | Status: AC
  Filled 2022-12-23: qty 10

## 2022-12-23 MED ORDER — CEFAZOLIN SODIUM-DEXTROSE 2-4 GM/100ML-% IV SOLN
2.0000 g | INTRAVENOUS | Status: AC
  Administered 2022-12-23: 2 g via INTRAVENOUS

## 2022-12-23 MED ORDER — HYDROCODONE-ACETAMINOPHEN 5-325 MG PO TABS: 1.0000 | ORAL_TABLET | Freq: Four times a day (QID) | ORAL | 0 refills | Status: AC | PRN

## 2022-12-23 MED ORDER — FENTANYL CITRATE (PF) 100 MCG/2ML IJ SOLN
INTRAMUSCULAR | Status: AC
  Administered 2022-12-23: 25 ug via INTRAVENOUS
  Filled 2022-12-23: qty 2

## 2022-12-23 MED ORDER — HYDROCODONE-ACETAMINOPHEN 5-325 MG PO TABS: 1.0000 | ORAL_TABLET | ORAL | Status: DC | PRN

## 2022-12-23 MED ORDER — EPHEDRINE SULFATE (PRESSORS) 50 MG/ML IJ SOLN
INTRAMUSCULAR | Status: DC | PRN
  Administered 2022-12-23: 10 mg via INTRAVENOUS
  Administered 2022-12-23 (×2): 5 mg via INTRAVENOUS

## 2022-12-23 MED ORDER — ONDANSETRON HCL 4 MG PO TABS: 4.0000 mg | ORAL_TABLET | Freq: Four times a day (QID) | ORAL | Status: DC | PRN

## 2022-12-23 MED ORDER — OXYCODONE HCL 5 MG PO TABS
ORAL_TABLET | ORAL | Status: AC
  Filled 2022-12-23: qty 1

## 2022-12-23 MED ORDER — KETOROLAC TROMETHAMINE 30 MG/ML IJ SOLN
INTRAMUSCULAR | Status: AC
  Filled 2022-12-23: qty 1

## 2022-12-23 MED ORDER — BUPIVACAINE LIPOSOME 1.3 % IJ SUSP
INTRAMUSCULAR | Status: AC
  Filled 2022-12-23: qty 20

## 2022-12-23 MED ORDER — CHLORHEXIDINE GLUCONATE 0.12 % MT SOLN
OROMUCOSAL | Status: AC
  Administered 2022-12-23: 15 mL via OROMUCOSAL
  Filled 2022-12-23: qty 15

## 2022-12-23 MED ORDER — MIDAZOLAM HCL 2 MG/2ML IJ SOLN
INTRAMUSCULAR | Status: AC
  Filled 2022-12-23: qty 2

## 2022-12-23 MED ORDER — ACETAMINOPHEN 10 MG/ML IV SOLN
INTRAVENOUS | Status: DC | PRN
  Administered 2022-12-23: 1000 mg via INTRAVENOUS

## 2022-12-23 MED ORDER — PHENYLEPHRINE HCL (PRESSORS) 10 MG/ML IV SOLN
INTRAVENOUS | Status: DC | PRN
  Administered 2022-12-23: 160 ug via INTRAVENOUS
  Administered 2022-12-23 (×2): 80 ug via INTRAVENOUS

## 2022-12-23 MED ORDER — FENTANYL CITRATE (PF) 100 MCG/2ML IJ SOLN
INTRAMUSCULAR | Status: DC | PRN
  Administered 2022-12-23 (×2): 50 ug via INTRAVENOUS

## 2022-12-23 MED ORDER — DEXAMETHASONE SODIUM PHOSPHATE 10 MG/ML IJ SOLN
INTRAMUSCULAR | Status: DC | PRN
  Administered 2022-12-23: 10 mg via INTRAVENOUS

## 2022-12-23 MED ORDER — SODIUM CHLORIDE FLUSH 0.9 % IV SOLN
INTRAVENOUS | Status: AC
  Filled 2022-12-23: qty 10

## 2022-12-23 MED ORDER — TRIAMCINOLONE ACETONIDE 40 MG/ML IJ SUSP
INTRAMUSCULAR | Status: DC | PRN
  Administered 2022-12-23: 63 mL via INTRA_ARTICULAR

## 2022-12-23 MED ORDER — DEXAMETHASONE SODIUM PHOSPHATE 10 MG/ML IJ SOLN
INTRAMUSCULAR | Status: AC
  Filled 2022-12-23: qty 1

## 2022-12-23 MED ORDER — SUGAMMADEX SODIUM 200 MG/2ML IV SOLN
INTRAVENOUS | Status: DC | PRN
  Administered 2022-12-23: 200 mg via INTRAVENOUS

## 2022-12-23 MED ORDER — 0.9 % SODIUM CHLORIDE (POUR BTL) OPTIME
TOPICAL | Status: DC | PRN
  Administered 2022-12-23: 1000 mL

## 2022-12-23 MED ORDER — OXYCODONE HCL 5 MG/5ML PO SOLN: 5.0000 mg | Freq: Once | ORAL | Status: AC | PRN

## 2022-12-23 MED ORDER — MIDAZOLAM HCL 2 MG/2ML IJ SOLN
INTRAMUSCULAR | Status: DC | PRN
  Administered 2022-12-23: 2 mg via INTRAVENOUS

## 2022-12-23 MED ORDER — KETOROLAC TROMETHAMINE 15 MG/ML IJ SOLN: 15.0000 mg | Freq: Once | INTRAMUSCULAR | Status: AC

## 2022-12-23 MED ORDER — ACETAMINOPHEN 10 MG/ML IV SOLN
INTRAVENOUS | Status: AC
  Filled 2022-12-23: qty 100

## 2022-12-23 MED ORDER — BUPIVACAINE HCL (PF) 0.5 % IJ SOLN
INTRAMUSCULAR | Status: AC
  Filled 2022-12-23: qty 30

## 2022-12-23 MED ORDER — ONDANSETRON HCL 4 MG/2ML IJ SOLN: 4.0000 mg | Freq: Four times a day (QID) | INTRAMUSCULAR | Status: DC | PRN

## 2022-12-23 MED ORDER — ORAL CARE MOUTH RINSE: 15.0000 mL | Freq: Once | OROMUCOSAL | Status: AC

## 2022-12-23 MED ORDER — LORAZEPAM 2 MG PO TABS: 2.0000 mg | ORAL_TABLET | Freq: Every day | ORAL | Status: DC

## 2022-12-23 MED ORDER — FENTANYL CITRATE (PF) 100 MCG/2ML IJ SOLN
25.0000 ug | INTRAMUSCULAR | Status: DC | PRN
  Administered 2022-12-23: 25 ug via INTRAVENOUS

## 2022-12-23 MED ORDER — ZOLPIDEM TARTRATE 5 MG PO TABS: 5.0000 mg | ORAL_TABLET | Freq: Every day | ORAL | Status: DC

## 2022-12-23 MED ORDER — LIDOCAINE HCL (CARDIAC) PF 100 MG/5ML IV SOSY
PREFILLED_SYRINGE | INTRAVENOUS | Status: DC | PRN
  Administered 2022-12-23: 100 mg via INTRAVENOUS

## 2022-12-23 MED ORDER — CHLORHEXIDINE GLUCONATE 0.12 % MT SOLN: 15.0000 mL | Freq: Once | OROMUCOSAL | Status: AC

## 2022-12-23 MED ORDER — EPHEDRINE 5 MG/ML INJ
INTRAVENOUS | Status: AC
  Filled 2022-12-23: qty 5

## 2022-12-23 MED ORDER — OXYCODONE HCL 5 MG PO TABS
5.0000 mg | ORAL_TABLET | Freq: Once | ORAL | Status: AC | PRN
  Administered 2022-12-23: 5 mg via ORAL

## 2022-12-23 MED ORDER — TRIAMCINOLONE ACETONIDE 40 MG/ML IJ SUSP
INTRAMUSCULAR | Status: AC
  Filled 2022-12-23: qty 2

## 2022-12-23 MED ORDER — LACTATED RINGERS IV SOLN: INTRAVENOUS | Status: DC

## 2022-12-23 SURGICAL SUPPLY — 46 items
ANCHOR ALL-SUT Q-FIX 2.8 (Anchor) IMPLANT
ANCHOR SUT QUATTRO KNTLS 4.5 (Anchor) IMPLANT
BLADE SURG SZ20 CARB STEEL (BLADE) ×1 IMPLANT
BNDG COHESIVE 6X5 TAN ST LF (GAUZE/BANDAGES/DRESSINGS) IMPLANT
CHLORAPREP W/TINT 26 (MISCELLANEOUS) ×1 IMPLANT
DRAPE 3/4 80X56 (DRAPES) ×1 IMPLANT
DRAPE INCISE IOBAN 66X60 STRL (DRAPES) ×1 IMPLANT
DRAPE ORTHO SPLIT 77X108 STRL (DRAPES) ×1
DRAPE SURG 17X11 SM STRL (DRAPES) ×2 IMPLANT
DRAPE SURG ORHT 6 SPLT 77X108 (DRAPES) ×1 IMPLANT
DRSG OPSITE POSTOP 4X10 (GAUZE/BANDAGES/DRESSINGS) IMPLANT
ELECT CAUTERY BLADE 6.4 (BLADE) ×1 IMPLANT
GLOVE BIO SURGEON STRL SZ7.5 (GLOVE) ×4 IMPLANT
GLOVE BIO SURGEON STRL SZ8 (GLOVE) ×4 IMPLANT
GLOVE BIOGEL PI IND STRL 8 (GLOVE) ×1 IMPLANT
GLOVE SURG UNDER LTX SZ8 (GLOVE) ×1 IMPLANT
GOWN STRL REUS W/ TWL LRG LVL3 (GOWN DISPOSABLE) ×1 IMPLANT
GOWN STRL REUS W/ TWL XL LVL3 (GOWN DISPOSABLE) ×1 IMPLANT
GOWN STRL REUS W/TWL LRG LVL3 (GOWN DISPOSABLE) ×1
GOWN STRL REUS W/TWL XL LVL3 (GOWN DISPOSABLE) ×1
HANDLE YANKAUER SUCT OPEN TIP (MISCELLANEOUS) IMPLANT
HOLSTER ELECTROSUGICAL PENCIL (MISCELLANEOUS) ×1 IMPLANT
KIT SUTURE 2.8 Q-FIX DISP (MISCELLANEOUS) IMPLANT
KIT TURNOVER KIT A (KITS) ×1 IMPLANT
MANIFOLD NEPTUNE II (INSTRUMENTS) ×1 IMPLANT
NDL FILTER BLUNT 18X1 1/2 (NEEDLE) ×1 IMPLANT
NDL SAFETY ECLIP 18X1.5 (MISCELLANEOUS) ×2 IMPLANT
NDL SPNL 20GX3.5 QUINCKE YW (NEEDLE) ×1 IMPLANT
NEEDLE FILTER BLUNT 18X1 1/2 (NEEDLE) ×1 IMPLANT
NEEDLE SPNL 20GX3.5 QUINCKE YW (NEEDLE) ×1 IMPLANT
NS IRRIG 1000ML POUR BTL (IV SOLUTION) ×1 IMPLANT
PACK HIP PROSTHESIS (MISCELLANEOUS) ×1 IMPLANT
PENCIL SMOKE EVACUATOR (MISCELLANEOUS) IMPLANT
PULSAVAC PLUS IRRIG FAN TIP (DISPOSABLE) ×2
STAPLER SKIN PROX 35W (STAPLE) ×1 IMPLANT
SUT ETHIBOND 2 V 37 (SUTURE) IMPLANT
SUT TICRON 2-0 30IN 311381 (SUTURE) ×3 IMPLANT
SUT VIC AB 0 CT1 36 (SUTURE) ×1 IMPLANT
SUT VIC AB 1 CT1 36 (SUTURE) ×2 IMPLANT
SUT VIC AB 2-0 CT1 (SUTURE) ×4 IMPLANT
SYR 10ML LL (SYRINGE) ×1 IMPLANT
SYR 20ML LL LF (SYRINGE) ×1 IMPLANT
SYR 30ML LL (SYRINGE) ×3 IMPLANT
TIP FAN IRRIG PULSAVAC PLUS (DISPOSABLE) ×1 IMPLANT
TRAP FLUID SMOKE EVACUATOR (MISCELLANEOUS) ×1 IMPLANT
WATER STERILE IRR 1000ML POUR (IV SOLUTION) ×1 IMPLANT

## 2022-12-23 NOTE — Anesthesia Preprocedure Evaluation (Addendum)
Anesthesia Evaluation  Patient identified by MRN, date of birth, ID band Patient awake    Reviewed: Allergy & Precautions, NPO status , Patient's Chart, lab work & pertinent test results  History of Anesthesia Complications Negative for: history of anesthetic complications  Airway Mallampati: III   Neck ROM: Full    Dental  (+) Missing   Pulmonary neg pulmonary ROS   Pulmonary exam normal breath sounds clear to auscultation       Cardiovascular hypertension, Normal cardiovascular exam Rhythm:Regular Rate:Normal  ECG 12/17/22:  Normal sinus rhythm Incomplete right bundle branch block  Echo 04/15/22:  NORMAL LEFT VENTRICULAR SYSTOLIC FUNCTION  NORMAL RIGHT VENTRICULAR SYSTOLIC FUNCTION  TRIVIAL REGURGITATION NOTED  NO VALVULAR STENOSIS     Neuro/Psych  Headaches  Neuromuscular disease (neuropathy)    GI/Hepatic hiatal hernia,GERD  ,,  Endo/Other  diabetes, Type 2  Obesity   Renal/GU negative Renal ROS     Musculoskeletal   Abdominal   Peds  Hematology negative hematology ROS (+)   Anesthesia Other Findings Last Ozempic dose 12/13/22  Reproductive/Obstetrics                             Anesthesia Physical Anesthesia Plan  ASA: 2  Anesthesia Plan: General   Post-op Pain Management:    Induction: Intravenous  PONV Risk Score and Plan: 3 and Ondansetron, Dexamethasone and Treatment may vary due to age or medical condition  Airway Management Planned: Oral ETT  Additional Equipment:   Intra-op Plan:   Post-operative Plan: Extubation in OR  Informed Consent: I have reviewed the patients History and Physical, chart, labs and discussed the procedure including the risks, benefits and alternatives for the proposed anesthesia with the patient or authorized representative who has indicated his/her understanding and acceptance.     Dental advisory given  Plan Discussed with:  CRNA  Anesthesia Plan Comments: (Patient consented for risks of anesthesia including but not limited to:  - adverse reactions to medications - damage to eyes, teeth, lips or other oral mucosa - nerve damage due to positioning  - sore throat or hoarseness - damage to heart, brain, nerves, lungs, other parts of body or loss of life  Informed patient about role of CRNA in peri- and intra-operative care.  Patient voiced understanding.)        Anesthesia Quick Evaluation

## 2022-12-23 NOTE — Evaluation (Signed)
Physical Therapy Evaluation Patient Details Name: Melody Brooks MRN: [id number] DOB: [birth year] Today's Date: 12/23/2022  History of Present Illness  Pt is POD 0 s/p Primary repair of right gluteus medius and minimus tendons with trochanteric bursectomy, right hip.  Clinical Impression  Pt is a pleasant 67 year old female who was admitted for R gluteus medium and minimus tendon repair with trochanteric bursectomy. Pt is POD 0 at time of evaluation. Pt performs transfers and ambulation with cga and RW. Pt educated on TTWB on R LE. Pt demonstrates deficits with strength/mobility/pain. Educated on stairs training. Would benefit from skilled PT to address above deficits and promote optimal return to PLOF. Recommend transition to [place] upon discharge from acute hospitalization.      Recommendations for follow up therapy are one component of a multi-disciplinary discharge planning process, led by the attending physician.  Recommendations may be updated based on patient status, additional functional criteria and insurance authorization.  Follow Up Recommendations Home health PT      Assistance Recommended at Discharge Intermittent Supervision/Assistance  Patient can return home with the following  A little help with walking and/or transfers;A little help with bathing/dressing/bathroom;Assist for transportation;Help with stairs or ramp for entrance    Equipment Recommendations Wheelchair (measurements PT)  Recommendations for Other Services       Functional Status Assessment Patient has had a recent decline in their functional status and demonstrates the ability to make significant improvements in function in a reasonable and predictable amount of time.     Precautions / Restrictions Precautions Precautions: Fall Restrictions Weight Bearing Restrictions: No      Mobility  Bed Mobility               General bed mobility comments: received up in recliner    Transfers Overall  transfer level: Needs assistance Equipment used: Rolling walker (2 wheels) Transfers: Sit to/from Stand Sit to Stand: Min guard           General transfer comment: multiple transfers performed from low surface with cues for hand placement and WBing restrictions    Ambulation/Gait Ambulation/Gait assistance: Min guard Gait Distance (Feet): 10 Feet Assistive device: Rolling walker (2 wheels) Gait Pattern/deviations: Step-to pattern       General Gait Details: poor endurance. Uses RW and heavy WBing through B UE. Required close chair follow  Stairs Stairs: Yes Stairs assistance: Min assist Stair Management: No rails Number of Stairs: 2 General stair comments: demonstrated prior to performance. Up/down with safe technique and husband present to block RW.  Wheelchair Mobility    Modified Rankin (Stroke Patients Only)       Balance Overall balance assessment: Needs assistance Sitting-balance support: Feet supported Sitting balance-Leahy Scale: Good     Standing balance support: Bilateral upper extremity supported Standing balance-Leahy Scale: Good                               Pertinent Vitals/Pain Pain Assessment Pain Assessment: No/denies pain    Home Living Family/patient expects to be discharged to:: Private residence Living Arrangements: Spouse/significant other Available Help at Discharge: Family Type of Home: House Home Access: Stairs to enter Entrance Stairs-Rails: None Entrance Stairs-Number of Steps: 2   Home Layout: One level Home Equipment: Conservation officer, nature (2 wheels);Rollator (4 wheels);Cane - single point      Prior Function Prior Level of Function : Independent/Modified Independent  Mobility Comments: active, reports no falls ADLs Comments: indep     Hand Dominance        Extremity/Trunk Assessment   Upper Extremity Assessment Upper Extremity Assessment: Generalized weakness (B UE grossly 4/5)    Lower  Extremity Assessment Lower Extremity Assessment: Generalized weakness (R LE grossly 3/5; L LE grossly 4/5)       Communication   Communication: No difficulties  Cognition Arousal/Alertness: Awake/alert Behavior During Therapy: WFL for tasks assessed/performed Overall Cognitive Status: Within Functional Limits for tasks assessed                                          General Comments      Exercises     Assessment/Plan    PT Assessment Patient needs continued PT services  PT Problem List Decreased strength;Decreased balance;Decreased mobility;Decreased activity tolerance;Cardiopulmonary status limiting activity       PT Treatment Interventions DME instruction;Gait training;Stair training;Therapeutic exercise;Balance training    PT Goals (Current goals can be found in the Care Plan section)  Acute Rehab PT Goals Patient Stated Goal: to go home PT Goal Formulation: With patient Time For Goal Achievement: 01/06/23 Potential to Achieve Goals: Good    Frequency 7X/week     Co-evaluation               AM-PAC PT "6 Clicks" Mobility  Outcome Measure Help needed turning from your back to your side while in a flat bed without using bedrails?: A Little Help needed moving from lying on your back to sitting on the side of a flat bed without using bedrails?: A Little Help needed moving to and from a bed to a chair (including a wheelchair)?: A Little Help needed standing up from a chair using your arms (e.g., wheelchair or bedside chair)?: A Little Help needed to walk in hospital room?: A Little Help needed climbing 3-5 steps with a railing? : A Little 6 Click Score: 18    End of Session Equipment Utilized During Treatment: Gait belt Activity Tolerance: Patient tolerated treatment well Patient left: in chair Nurse Communication: Mobility status PT Visit Diagnosis: Muscle weakness (generalized) (M62.81);Difficulty in walking, not elsewhere classified  (R26.2);Unsteadiness on feet (R26.81)    Time: [id number] PT Time Calculation (min) (ACUTE ONLY): 41 min   Charges:   PT Evaluation $PT Eval Moderate Complexity: 1 Mod PT Treatments $Gait Training: 23-37 mins        Greggory Stallion, PT, DPT, GCS [phone]   Owyn Raulston 12/23/2022, 3:54 PM

## 2022-12-23 NOTE — Discharge Instructions (Addendum)
Orthopedic discharge instructions: May sponge-bathe with intact OpSite dressing. Keep the brace on at all times except may loosen for bathing purposes. Apply ice frequently to hip. Take pain medication as prescribed when needed.  May supplement with ES Tylenol if necessary. Toe-touch weight-bearing on right leg in pelvic brace - use walker for balance and support. Follow-up in 10-14 days or as scheduled.     AMBULATORY SURGERY  DISCHARGE INSTRUCTIONS   The drugs that you were given will stay in your system until tomorrow so for the next 24 hours you should not:  Drive an automobile Make any legal decisions Drink any alcoholic beverage   You may resume regular meals tomorrow.  Today it is better to start with liquids and gradually work up to solid foods.  You may eat anything you prefer, but it is better to start with liquids, then soup and crackers, and gradually work up to solid foods.   Please notify your doctor immediately if you have any unusual bleeding, trouble breathing, redness and pain at the surgery site, drainage, fever, or pain not relieved by medication.     Your post-operative visit with Dr.                                       is: Date:                        Time:    Please call to schedule your post-operative visit.  Additional Instructions:

## 2022-12-23 NOTE — Progress Notes (Signed)
Patient suffers from weakness which impairs their ability to perform daily activities like toileting in the home.  A walker will not resolve  issue with performing activities of daily living. A wheelchair will allow patient to safely perform daily activities. Patient is not able to propel themselves in the home using a standard weight wheelchair due to endurance. Patient can self propel in the lightweight wheelchair. Length of need 6 months .  Accessories: elevating leg rests (ELRs), wheel locks, extensions and anti-tippers.

## 2022-12-23 NOTE — H&P (Signed)
History of Present Illness:  Melody Brooks is a 67 y.o. female who presents for follow-up of her lateral sided right hip pain secondary to trochanteric bursitis. The patient was last seen for these symptoms about 6 months ago. At that time, we discussed surgical excision of the chronically inflamed trochanteric bursa, but the patient elected to put off this option to allow her husband to go through and recuperate from a total knee arthroplasty by Dr. Marry Guan. The patient's husband has recuperated well from this procedure. The patient continues to have difficulty with pain in the lateral aspect of her right hip. Her symptoms are aggravated by any prolonged standing or ambulation, as well as she tries to sleep on her right side at night. The patient did receive a repeat injection into the right trochanteric region last month by Dr. Sharlet Salina which she states has provided temporary partial relief of her symptoms for about 2 weeks before her symptoms recurred. She continues to note moderate pain in her hip which she rates as 6/10 on today's visit and for which she will take hydrocodone and ibuprofen as necessary with limited benefit. She also has been applying a Lidoderm patch and heat all, also with limited benefit. The patient is now ready to consider more aggressive treatment options.  Current Outpatient Medications: acetaminophen (TYLENOL) 325 MG tablet Take 325 mg by mouth as needed for Pain.  amLODIPine (NORVASC) 5 MG tablet Take 1 tablet (5 mg total) by mouth once daily 90 tablet 1  aspirin 81 MG EC tablet Take 81 mg by mouth daily.  cholecalciferol (VITAMIN D3) 1,000 unit tablet Take 5,000 Units by mouth once daily  cyanocobalamin (VITAMIN B12) 1,000 mcg/mL injection Inject 1 mL (1,000 mcg total) into the muscle every 14 (fourteen) days for 180 days 6 mL 1  cyclobenzaprine (FLEXERIL) 10 MG tablet Take 1 tablet (10 mg total) by mouth at bedtime 90 tablet 3  diphenhydrAMINE (BENADRYL) 25 mg tablet Take 25  mg by mouth 2 (two) times daily  DULoxetine (CYMBALTA) 30 MG DR capsule Take 1 capsule (30 mg total) by mouth once daily 90 capsule 3  EPINEPHrine (EPIPEN) 0.3 mg/0.3 mL auto-injector Inject 0.3 mg into the muscle once as needed  esomeprazole (NEXIUM) 40 MG DR capsule Take 40 mg by mouth once daily  estrogen, conjugated,-medroxyprogesterone (PREMPRO) 0.3-1.5 mg tablet Take 1 tablet by mouth daily.  famotidine (PEPCID) 40 MG tablet Take 40 mg by mouth once daily  FUROsemide (LASIX) 20 MG tablet Take 1 tablet (20 mg total) by mouth once daily 90 tablet 3  glipiZIDE (GLUCOTROL) 2.5 MG XL tablet Take 1 tablet (2.5 mg total) by mouth once daily 90 tablet 1  HYDROcodone-acetaminophen (NORCO) 5-325 mg tablet 1/2-1 tablet po bid prn 30 tablet 0  ibuprofen (ADVIL,MOTRIN) 200 MG tablet Take 200 mg by mouth as needed for Pain  LORazepam (ATIVAN) 2 MG tablet Take 1 tablet (2 mg total) by mouth at bedtime 90 tablet 1  magnesium oxide (MAG-OX) 400 mg (241.3 mg magnesium) tablet Take 400 mg by mouth once daily  pioglitazone (ACTOS) 45 MG tablet Take 1 tablet (45 mg total) by mouth once daily 90 tablet 1  predniSONE (DELTASONE) 10 MG tablet Take 10 mg by mouth as directed Prn as directed  pregabalin (LYRICA) 100 MG capsule Take 1 capsule (100 mg total) by mouth 2 (two) times daily 180 capsule 1  propranoloL (INDERAL LA) 60 MG LA capsule Take 1 capsule (60 mg total) by mouth once daily 90 capsule 1  rosuvastatin (CRESTOR) 10 MG tablet Take 1 tablet (10 mg total) by mouth once daily 90 tablet 3  semaglutide (OZEMPIC) 0.25 mg or 0.5 mg (2 mg/3 mL) pen injector Inject 0.75 mLs (0.5 mg total) subcutaneously once a week 9 mL 1  SITagliptin-metFORMIN (JANUMET) 50-1,000 mg tablet Take 1 tablet by mouth 2 (two) times daily with meals 180 tablet 1  syringe with needle 3 mL 23 x 1" Syrg Use syringes as directed. 6 each 3  zolpidem (AMBIEN) 5 MG tablet Take 1 tablet (5 mg total) by mouth at bedtime as needed for Sleep 90  tablet 1   Allergies:  Lisinopril (Hives, Swelling, Itching, Rash, Angioedema)  Past Medical History:  Allergy Lisinopril  Anemia  Angioedema 05/24/2013  Basal cell carcinoma  Right eyelid  Diabetes mellitus, type II (CMS-HCC)  GERD (gastroesophageal reflux disease)  History of cataract bilateral  Hypertension  Lumbar spondylosis  Lumbosacral radiculitis  Mixed hyperlipidemia 12/01/2020  Neuropathy  Obesity  Osteoarthritis  Peripheral neuropathy  Plantar fasciitis  Pneumonia 2013, 2014  Scoliosis  Trochanteric bursitis   Past Surgical History:  LAPAROSCOPIC TUBAL LIGATION 1998  CATARACT EXTRACTION 2006, 2007   Family History:  COPD Mother  Osteoarthritis Mother  Stroke Mother  High blood pressure (Hypertension) Mother  Thyroid disease Mother  Diabetes type II Father  Prostate cancer Father  Peripheral Vascular Disease (PVD or blocked arteries in arms and legs) Father  Colon cancer Father  Hip fracture Father  High blood pressure (Hypertension) Father  No Known Problems Sister  Thyroid disease Sister  Diabetes type II Brother  Diabetes type II Brother  Hyperlipidemia (Elevated cholesterol) Brother   Social History:   Socioeconomic History:  Marital status: Married  Tobacco Use  Smoking status: Never  Smokeless tobacco: Never  Tobacco comments:  Father and Mother both smoked  Vaping Use  Vaping Use: Never used  Substance and Sexual Activity  Alcohol use: No  Drug use: No  Sexual activity: Yes  Partners: Male  Birth control/protection: Post-menopausal  Social History Narrative  Married for 34 years. Has one child grown and healthy. 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 Has been around dust and animals all her life. Has an indoor dog at home. No foreign travel. Lives in a house that is damp. Gas and electric heat. Husband and son can assist in her care if needed.   Review of Systems:  A comprehensive 14  point ROS was performed, reviewed, and the pertinent orthopaedic findings are documented in the HPI.  Physical Exam: Vitals:  11/26/22 1123  BP: 124/72  Weight: (!) 109.3 kg (241 lb)  Height: 165.1 cm (5' 5"$ )  PainSc: 6  PainLoc: Hip   General/Constitutional: Pleasant significantly overweight middle-aged female in no acute distress. Neuro/Psych: Normal mood and affect, oriented to person, place and time. Eyes: Non-icteric. Pupils are equal, round, and reactive to light, and exhibit synchronous movement. ENT: Unremarkable. Lymphatic: No palpable adenopathy. Respiratory: Lungs clear to auscultation, Normal chest excursion, No wheezes, and Non-labored breathing Cardiovascular: Regular rate and rhythm. No murmurs. and No edema, swelling or tenderness, except as noted in detailed exam. Integumentary: No impressive skin lesions present, except as noted in detailed exam. Musculoskeletal: Unremarkable, except as noted in detailed exam.  Lumbar exam: Skin inspection of the lower back again is unremarkable. In stance, her spine is straight and her pelvis is level. She is able to arise from a seated position with at most minimal difficulty,  and ambulates with a mild limp, favoring her right leg. She uses a cane for balance and support. She experiences minimal residual tenderness along the lower lumbar spine and lumbosacral junction. She describes more moderate pain over the right greater trochanteric region, and has minimal tenderness to palpation over the left trochanteric region. She denies having any pain over either SI joint or either sciatic notch region. She is able to heel raise and toe raise without difficulty or weakness.   Hip exam: She notes only minimal reproduction of her lateral sided right hip pain with right hip range of motion. She has full range of motion bilaterally. She remains neurovascularly intact to both lower extremities, and exhibits negative sitting straight leg raises  bilaterally.  Assessment: 1. Trochanteric bursitis of right hip. 2. Type II diabetes mellitus with complication (CMS-HCC)  3. Fibromuscular dysplasia (CMS-HCC)  4. Class 2 severe obesity due to excess calories with serious comorbidity and body mass index (BMI) of 38.0 to 38.9 in adult     Plan: The treatment options were discussed with the patient and her husband. In addition, patient educational materials were provided regarding the diagnosis and treatment options. The patient is quite frustrated with her symptoms and function limitations, and is ready to consider more aggressive treatment options. Therefore, I have recommended a surgical procedure, specifically an open excision of her right trochanteric bursa. The procedure was discussed with the patient, as were the potential risks (including bleeding, infection, nerve and/or blood vessel injury, persistent or recurrent pain, need for further surgery, blood clots, strokes, heart attacks and/or arhythmias, pneumonia, etc.) and benefits. The patient states his/her understanding and wishes to proceed. All of the patient's questions and concerns were answered. She can call any time with further concerns. She will follow up post-surgery, routine.    H&P reviewed and patient re-examined. No changes.

## 2022-12-23 NOTE — Op Note (Signed)
12/23/2022  1:17 PM  Patient:   Melody Brooks  Pre-Op Diagnosis:   Trochanteric bursitis of right hip.  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  Procedure:   Primary repair of right gluteus medius and minimus tendons with trochanteric bursectomy, right hip.  Surgeon:   Pascal Lux, MD  Assistant:   Cameron Proud, PA-C  Anesthesia:   GET  Findings:   As above.  Complications:   None  Fluids:   1200 cc crystalloid  EBL:   75 cc  UOP:   None  TT:   None  Drains:   None  Closure:   Staples  Brief Clinical Note:   The patient is a 67 year old female with a long history of progressively worsening lateral sided right hip pain.  Her symptoms have progressed despite medications, activity modification, therapy, injections, etc.  History and examination consistent with chronic trochanteric bursitis.  A preoperative MRI scan confirmed the presence of trochanteric bursitis and also suggested "mild" tendinopathy of the gluteus medius and minimus tendons.  The patient presents at this time for an open right trochanteric bursectomy.  Procedure:   The patient was brought into the operating room and laid in the supine position.  After adequate general endotracheal intubation and anesthesia were obtained, the patient was repositioned in the left lateral decubitus position and secured using a lateral hip positioner after placing an axillary roll.  The right hip and lower extremity were prepped with ChloraPrep solution before being draped sterilely.  Preoperative antibiotics were administered.  A timeout was performed to verify the appropriate surgical site.  A standard posterior approach to the hip was made through an approximately 6-7 inch incision. The incision was carried down through the subcutaneous tissues to expose the gluteal fascia and proximal end of the iliotibial band. These structures were split  the length of the incision and the Charnley self-retaining hip retractor placed.  The bursal tissues were identified and debrided/released to permit better visualization of the hip abductor muscles.  It became clear quickly that both the gluteus medius and minimus tendons had completely torn from their attachments to the greater trochanter.  The exposed portion of the greater trochanter was roughened with a rongeur to stimulate bleeding bone.  Four [employer]hew 2.9 mm Q-Fix anchors placed in the superior and anterior portions of the greater trochanter.  Each of these sets of sutures were then woven through the gluteus medius and minimus tendons in a modified Krakw type fashion.  With the hip held in mild abduction, each of the sutures were tied securely then brought back laterally and secured using two [place] Quatro-Link knotless anchors to create a two-layer closure.  The wound was copiously irrigated with sterile saline solution using the jet lavage system.  The iliotibial band was reapproximated using #1 Vicryl interrupted sutures before the gluteal fascia was closed using a running #1 Vicryl suture.  The subcutaneous tissues were closed in several layers using 2-0 Vicryl interrupted sutures before the skin was closed using staples.  A sterile occlusive dressing was applied to the wound.  A hip abduction adduction brace with a thigh cuff was placed on the patient before the patient was rolled back into the supine position on her hospital bed.  She was then awakened, extubated, and returned to the recovery room in satisfactory condition after tolerating the procedure well.

## 2022-12-23 NOTE — TOC Progression Note (Addendum)
Transition of Care [facility]) - Progression Note    Patient Details  Name: Melody Brooks MRN: [id number] Date of Birth: [birth year]  Transition of [facility]) CM/SW [place], RN Phone Number: 12/23/2022, 3:02 PM  Clinical Narrative:    [facility] reached out to me and let me know they were set up for [facility] by [place] office I reached out to [place] at [place] and Cyril Mourning at [place] to get a price to rent a Wheelchair Awaiting to hear back with price Update, Darnelle Maffucci with [employer] provide estimate cost as $150-200 month without insurance for rental,  [employer] provided the price as $4.82 per month  With her insurance covering the rest The patient would like it to be delivered to her house I notified [employer] Expected Discharge Plan: [place] Barriers to Discharge: No Barriers Identified  Expected Discharge Plan and Services   Discharge Planning Services: CM Consult   Living arrangements for the past 2 months: Single Family Home Expected Discharge Date: 12/23/22                         [facility] Arranged: PT HH Agency: [facility] Date [place]: 12/23/22 Time [place]: 1502 Representative spoke with at [place]: [place]   Social Determinants of Health ([place]) Interventions SDOH Screenings   Alcohol Screen: Low Risk  (07/28/2020)  Depression (PHQ2-9): Low Risk  (07/28/2020)  Tobacco Use: Low Risk  (12/23/2022)    Readmission Risk Interventions     No data to display

## 2022-12-23 NOTE — Transfer of Care (Signed)
Immediate Anesthesia Transfer of Care Note  Patient: Melody Brooks  Procedure(s) Performed: OPEN EXCISION OF RIGHT TROCHANTERIC BURSA (Right: Hip)  Patient Location: PACU  Anesthesia Type:General  Level of Consciousness: patient cooperative and responds to stimulation  Airway & Oxygen Therapy: Patient Spontanous Breathing and Patient connected to face mask oxygen  Post-op Assessment: Report given to RN and Post -op Vital signs reviewed and stable  Post vital signs: Reviewed and stable  Last Vitals:  Vitals Value Taken Time  BP 160/90 12/23/22 1302  Temp    Pulse 66 12/23/22 1304  Resp 21 12/23/22 1303  SpO2 98 % 12/23/22 1304  Vitals shown include unvalidated device data.  Last Pain:  Vitals:   12/23/22 0834  TempSrc: Oral  PainSc: 0-No pain         Complications: No notable events documented.

## 2022-12-23 NOTE — Anesthesia Procedure Notes (Signed)
Procedure Name: Intubation Date/Time: 12/23/2022 10:57 AM  Performed by: Patience Musca., CRNAPre-anesthesia Checklist: Patient identified, Patient being monitored, Timeout performed, Emergency Drugs available and Suction available Patient Re-evaluated:Patient Re-evaluated prior to induction Oxygen Delivery Method: Circle system utilized Preoxygenation: Pre-oxygenation with 100% oxygen Induction Type: IV induction Ventilation: Mask ventilation without difficulty Laryngoscope Size: 3 and McGraph Grade View: Grade I Tube type: Oral Tube size: 7.0 mm Number of attempts: 1 Airway Equipment and Method: Stylet Placement Confirmation: ETT inserted through vocal cords under direct vision, positive ETCO2 and breath sounds checked- equal and bilateral Secured at: 21 cm Tube secured with: Tape Dental Injury: Teeth and Oropharynx as per pre-operative assessment

## 2022-12-23 NOTE — Anesthesia Postprocedure Evaluation (Signed)
Anesthesia Post Note  Patient: Melody Brooks  Procedure(s) Performed: OPEN EXCISION OF RIGHT TROCHANTERIC BURSA (Right: Hip)  Patient location during evaluation: PACU Anesthesia Type: General Level of consciousness: awake Pain management: satisfactory to patient Vital Signs Assessment: post-procedure vital signs reviewed and stable Respiratory status: nonlabored ventilation Cardiovascular status: stable Anesthetic complications: no   There were no known notable events for this encounter.   Last Vitals:  Vitals:   12/23/22 1315 12/23/22 1330  BP: 134/71 (!) 125/98  Pulse: 65 68  Resp: 16 15  Temp:    SpO2: 96% 97%    Last Pain:  Vitals:   12/23/22 1330  TempSrc:   PainSc: 8                  VAN STAVEREN,Taji Sather

## 2022-12-24 ENCOUNTER — Encounter: Payer: Self-pay | Admitting: Surgery

## 2023-07-27 ENCOUNTER — Other Ambulatory Visit (INDEPENDENT_AMBULATORY_CARE_PROVIDER_SITE_OTHER): Payer: Self-pay | Admitting: Vascular Surgery

## 2023-07-27 DIAGNOSIS — I6523 Occlusion and stenosis of bilateral carotid arteries: Secondary | ICD-10-CM

## 2023-07-29 ENCOUNTER — Encounter (INDEPENDENT_AMBULATORY_CARE_PROVIDER_SITE_OTHER): Payer: Self-pay | Admitting: Nurse Practitioner

## 2023-07-29 ENCOUNTER — Ambulatory Visit (INDEPENDENT_AMBULATORY_CARE_PROVIDER_SITE_OTHER): Payer: Medicare Other

## 2023-07-29 ENCOUNTER — Ambulatory Visit (INDEPENDENT_AMBULATORY_CARE_PROVIDER_SITE_OTHER): Payer: Medicare Other | Admitting: Nurse Practitioner

## 2023-07-29 VITALS — BP 128/73 | HR 72 | Resp 16 | Wt 258.0 lb

## 2023-07-29 DIAGNOSIS — E1142 Type 2 diabetes mellitus with diabetic polyneuropathy: Secondary | ICD-10-CM | POA: Diagnosis not present

## 2023-07-29 DIAGNOSIS — I6523 Occlusion and stenosis of bilateral carotid arteries: Secondary | ICD-10-CM

## 2023-07-29 DIAGNOSIS — I10 Essential (primary) hypertension: Secondary | ICD-10-CM

## 2023-07-29 NOTE — Progress Notes (Signed)
Subjective:    Patient ID: Melody Brooks, female    DOB: 09/13/1956, 67 y.o.   MRN: [id number] Chief Complaint  Patient presents with   Follow-up    17yr carotid u/s follow up    The patient is seen for follow up evaluation of carotid stenosis. The carotid stenosis followed by ultrasound.   The patient denies amaurosis fugax. There is no recent history of TIA symptoms or focal motor deficits. There is no prior documented CVA.  She has a history of fibromuscular dysplasia.  The patient is taking enteric-coated aspirin 81 mg daily.  There is no history of migraine headaches. There is no history of seizures.  No recent shortening of the patient's walking distance or new symptoms consistent with claudication.  No history of rest pain symptoms. No new ulcers or wounds of the lower extremities have occurred.  There is no history of DVT, PE or superficial thrombophlebitis. No documented recent episodes of angina or shortness of breath documented.   Carotid Duplex done today shows <40%.  No change compared to last study in 2022    Review of Systems  Musculoskeletal:  Positive for arthralgias.  All other systems reviewed and are negative.      Objective:   Physical Exam Vitals reviewed.  HENT:     Head: Normocephalic.  Neck:     Vascular: No carotid bruit.  Cardiovascular:     Rate and Rhythm: Normal rate.     Pulses: Normal pulses.  Pulmonary:     Effort: Pulmonary effort is normal.  Skin:    General: Skin is warm and dry.  Neurological:     Mental Status: She is alert and oriented to person, place, and time.  Psychiatric:        Mood and Affect: Mood normal.        Behavior: Behavior normal.        Thought Content: Thought content normal.        Judgment: Judgment normal.     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   Fatigue    Fibromuscular dysplasia (HCC)    Generalized headaches    GERD (gastroesophageal reflux disease)    Hemorrhoids    Hiatal hernia    Hypertension    Neuropathy    Palpitations    Pneumonia    Rectal bleeding    Rectal pain     Social History   Socioeconomic History   Marital status: Married    Spouse name: Not on file   Number of children: Not on file   Years of education: Not on file   Highest education level: Not on file  Occupational History   Not on file  Tobacco Use   Smoking status: Never   Smokeless tobacco: Never   Tobacco comments:    tobacco use- no  Substance and Sexual Activity   Alcohol use: No   Drug use: No   Sexual activity: Not on file  Other Topics Concern  Not on file  Social History Narrative   Full time. Married. Regularly exercises- walks.    Social Determinants of Health   Financial Resource Strain: Low Risk  (07/01/2023)   Received from [facility]   Overall Financial Resource Strain (CARDIA)    Difficulty of Paying Living Expenses: Not hard at all  Food Insecurity: No Food Insecurity (07/01/2023)   Received from [facility]   Hunger Vital Sign    Worried About Running Out of Food in the Last Year: Never true    Ran Out of Food in the Last Year: Never true  Transportation Needs: No Transportation Needs (07/01/2023)   Received from [facility] - Transportation    In the past 12 months, has lack of transportation kept you from medical appointments or from getting medications?: No    Lack of Transportation (Non-Medical): No  Physical Activity: Not on file  Stress: Not on file  Social Connections: Not on file  Intimate Partner Violence: Not on file     Past Surgical History:  Procedure Laterality Date   Basal cell cancer  removal     colonoscopy  2010   EXCISION/RELEASE BURSA HIP Right 12/23/2022   Procedure: OPEN EXCISION OF RIGHT TROCHANTERIC BURSA;  Surgeon: Christena Flake, MD;  Location: [facility] ORS;  Service: Orthopedics;  Laterality: Right;   EXCISIONAL HEMORRHOIDECTOMY  03/2012   IR ANGIO EXTERNAL CAROTID SEL EXT CAROTID BILAT MOD SED  09/04/2021   IR ANGIO INTRA EXTRACRAN SEL INTERNAL CAROTID BILAT MOD SED  09/04/2021   IR ANGIO VERTEBRAL SEL SUBCLAVIAN INNOMINATE UNI L MOD SED  09/04/2021   IR ANGIO VERTEBRAL SEL VERTEBRAL UNI R MOD SED  09/04/2021   IR US GUIDE VASC ACCESS RIGHT  09/04/2021   right and left eye cataract surgery  2006, 2008   right in 2006 and left 2008   TUBAL LIGATION  1995    Family History  Problem Relation Age of Onset   Heart failure Mother    COPD Mother    Hypertension Mother    Arthritis Mother    Cancer Father        prostate   Parkinson's disease Father    Colon cancer Father    Cancer Maternal Aunt        colon   Cancer Paternal Grandmother        stomach   Diabetes Brother    Diabetes Brother     Allergies  Allergen Reactions   Lisinopril Hives, Swelling, Itching and Rash    Angioedema  Angioedema    Angioedema Angioedema Angioedema Angioedema Angioedema  Angioedema  Angioedema  Angioedema  Angioedema  Angioedema  Angioedema, Angioedema, Angioedema, Angioedema, Angioedema  Angioedema  Angioedema  Angioedema  Angioedema  Angioedema  Angioedema  Angioedema    Angioedema Angioedema Angioedema Angioedema Angioedema  Angioedema  Angioedema  Angioedema       Latest Ref Rng & Units 09/04/2021    9:14 AM 09/10/2020   11:00 AM 02/08/2020   10:05 AM  CBC  WBC 4.0 - 10.5 K/uL 7.9  7.1  6.1   Hemoglobin 12.0 - 15.0 g/dL 18.8  41.6  60.6   Hematocrit 36.0 - 46.0 % 35.0  33.8  35.8   Platelets 150 - 400 K/uL 231  268  247       CMP     Component Value Date/Time   NA  139 09/04/2021 0914   NA 139 09/10/2020  1100   K 4.7 09/04/2021 0914   CL 104 09/04/2021 0914   CO2 28 09/04/2021 0914   GLUCOSE 139 (H) 09/04/2021 0914   BUN 20 09/04/2021 0914   BUN 20 09/10/2020 1100   CREATININE 1.20 (H) 09/04/2021 0914   CALCIUM 9.3 09/04/2021 0914   PROT 6.4 09/10/2020 1100   ALBUMIN 4.2 09/10/2020 1100   AST 18 09/10/2020 1100   ALT 17 09/10/2020 1100   ALKPHOS 78 09/10/2020 1100   BILITOT 0.4 09/10/2020 1100   GFRNONAA 50 (L) 09/04/2021 0914     No results found.     Assessment & Plan:   1. Bilateral carotid artery stenosis Recommend:  Given the patient's asymptomatic subcritical stenosis no further invasive testing or surgery at this time.  Duplex ultrasound shows <40% stenosis bilaterally.  Continue antiplatelet therapy as prescribed Continue management of CAD, HTN and Hyperlipidemia Healthy heart diet,  encouraged exercise at least 4 times per week Follow up in 24 months with duplex ultrasound and physical exam   2. Essential hypertension Continue antihypertensive medications as already ordered, these medications have been reviewed and there are no changes at this time.  3. Diabetic polyneuropathy associated with type 2 diabetes mellitus (HCC) Continue hypoglycemic medications as already ordered, these medications have been reviewed and there are no changes at this time.  Hgb A1C to be monitored as already arranged by primary service   Current Outpatient Medications on File Prior to Visit  Medication Sig Dispense Refill   acetaminophen (TYLENOL) 500 MG tablet Take 1,000 mg by mouth every 6 (six) hours as needed for moderate pain.     Alcohol Swabs 70 % PADS 1 each by Does not apply route 2 (two) times daily. 100 each 12   Alcohol Swabs PADS Used to check blood sugars 3-4 times a day. Dx E11.9. 360 each 3   aspirin 81 MG tablet Take 81 mg by mouth daily.     Blood Glucose Monitoring Suppl (ONE TOUCH ULTRA MINI) w/Device KIT 1 each by Does  not apply route daily. 1 kit 0   Cholecalciferol (DIALYVITE VITAMIN D 5000) 125 MCG (5000 UT) capsule Take 5,000 Units by mouth daily.     Cyanocobalamin (B-12 IJ) Inject as directed every 14 (fourteen) days.     cyclobenzaprine (FLEXERIL) 10 MG tablet Take 10 mg by mouth at bedtime.     diphenhydrAMINE (BENADRYL) 25 MG tablet Take 25 mg by mouth 2 (two) times daily.     DULoxetine (CYMBALTA) 30 MG capsule Take 30 mg by mouth daily.     EPIPEN 2-PAK 0.3 MG/0.3ML SOAJ injection Inject 0.3 mg into the muscle as needed for anaphylaxis.     esomeprazole (NEXIUM) 20 MG capsule Take 40 mg by mouth daily at 12 noon.     estrogen, conjugated,-medroxyprogesterone (PREMPRO) 0.3-1.5 MG tablet Take 1 tablet by mouth daily.     famotidine (PEPCID) 20 MG tablet Take 20 mg by mouth at bedtime.     furosemide (LASIX) 20 MG tablet Take 20 mg by mouth daily.     glipiZIDE (GLUCOTROL XL) 2.5 MG 24 hr tablet TAKE 1 TABLET BY MOUTH  DAILY WITH BREAKFAST 90 tablet 2   glucose blood (CONTOUR NEXT TEST) test strip TEST TWICE DAILY 200 strip 3   Iron Combinations (IRON COMPLEX PO) Take 20 mg by mouth daily.     JANUMET 50-1000 MG tablet TAKE 1 TABLET BY MOUTH  TWICE DAILY 180 tablet 2   Lancets (ONETOUCH ULTRASOFT) lancets  USE TO TEST ONCE DAILY 100 each 3   LORazepam (ATIVAN) 2 MG tablet Take 1 tablet (2 mg total) by mouth at bedtime.     losartan (COZAAR) 50 MG tablet TAKE 1 TABLET BY MOUTH  DAILY 90 tablet 2   magnesium oxide (MAG-OX) 400 (240 Mg) MG tablet Take 400 mg by mouth daily.     ONETOUCH ULTRA test strip USE TO CHECK BLOOD SUGAR  (FASTING) EVERY MORNING.  TEST UP TO 4 TIMES DAILY IF HAVING HYPOGLYCEMIA. 400 each 3   pioglitazone (ACTOS) 45 MG tablet TAKE 1 TABLET BY MOUTH AT  BEDTIME 60 tablet 5   predniSONE (DELTASONE) 10 MG tablet Take 10 mg by mouth 3 (three) times daily as needed (hives/itching).     pregabalin (LYRICA) 100 MG capsule Take 1 capsule (100 mg total) by mouth 3 (three) times daily.  (Patient taking differently: Take 100 mg by mouth 2 (two) times daily.) 270 capsule 3   propranolol ER (INDERAL LA) 60 MG 24 hr capsule Take 60 mg by mouth daily.     rosuvastatin (CRESTOR) 10 MG tablet Take 10 mg by mouth daily.     Semaglutide (OZEMPIC, 0.25 OR 0.5 MG/DOSE, [place]) Inject into the skin once a week. Mondays     zinc gluconate 50 MG tablet Take 50 mg by mouth daily.     zolpidem (AMBIEN) 5 MG tablet Take 1 tablet (5 mg total) by mouth at bedtime. (for peripheral neuropathy)     fluticasone-salmeterol (ADVAIR) 250-50 MCG/ACT AEPB Inhale 1 puff into the lungs 2 (two) times daily as needed (chest congestion). (Patient not taking: Reported on 07/29/2023)     HYDROcodone-acetaminophen (NORCO/VICODIN) 5-325 MG tablet Take 1-2 tablets by mouth every 6 (six) hours as needed for moderate pain or severe pain. (Patient not taking: Reported on 07/29/2023) 30 tablet 0   No current facility-administered medications on file prior to visit.    There are no Patient Instructions on file for this visit. No follow-ups on file.   Georgiana Spinner, NP

## 2023-08-23 ENCOUNTER — Other Ambulatory Visit: Payer: Self-pay | Admitting: Surgery

## 2023-08-23 DIAGNOSIS — M4807 Spinal stenosis, lumbosacral region: Secondary | ICD-10-CM

## 2023-09-06 ENCOUNTER — Ambulatory Visit
Admission: RE | Admit: 2023-09-06 | Discharge: 2023-09-06 | Disposition: A | Discharge status: Home / Self Care | Payer: Medicare Other | Source: Ambulatory Visit | Attending: Surgery | Admitting: Surgery

## 2023-09-06 DIAGNOSIS — M4807 Spinal stenosis, lumbosacral region: Secondary | ICD-10-CM

## 2024-01-26 ENCOUNTER — Ambulatory Visit: Admitting: Podiatry

## 2024-02-14 ENCOUNTER — Ambulatory Visit: Admitting: Podiatry

## 2024-02-14 DIAGNOSIS — M7672 Peroneal tendinitis, left leg: Secondary | ICD-10-CM | POA: Diagnosis not present

## 2024-02-14 DIAGNOSIS — M7671 Peroneal tendinitis, right leg: Secondary | ICD-10-CM

## 2024-02-14 NOTE — Progress Notes (Signed)
 Subjective:  Patient ID: Melody Brooks, female    DOB: 1956/10/20,  MRN: [id number]  Chief Complaint  Patient presents with   Toe Pain    Pt stated that she has neuropathy    68 y.o. female presents with the above complaint.  Patient presents with bilateral peroneal tendinitis she states that has been going on for quite some time.  She does have history of neuropathy.  She has diabetes.  She wanted to get it evaluated.  She states that it is not a neuropathy type of pain that she is experiencing.  She is having pain primarily due to local tenderness to the outside of the feet.  Pain scale 7 out of 10 dull aching nature   Review of Systems: Negative except as noted in the HPI. Denies N/V/F/Ch.  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   Fatigue    Fibromuscular dysplasia (HCC)    Generalized headaches    GERD (gastroesophageal reflux disease)    Hemorrhoids    Hiatal hernia    Hypertension    Neuropathy    Palpitations    Pneumonia    Rectal bleeding    Rectal pain     Current Outpatient Medications:    acetaminophen  (TYLENOL ) 500 MG tablet, Take 1,000 mg by mouth every 6 (six) hours as needed for moderate pain., Disp: , Rfl:    Alcohol  Swabs  70 % PADS, 1 each by Does not apply route 2 (two) times daily., Disp: 100 each, Rfl: 12   Alcohol  Swabs  PADS, Used to check blood sugars 3-4 times a day. Dx E11.9., Disp: 360 each, Rfl: 3   amLODipine (NORVASC) 5 MG tablet, Take 5 mg by mouth daily., Disp: , Rfl:    aspirin 81 MG tablet, Take 81 mg by mouth daily., Disp: , Rfl:    Blood Glucose Monitoring Suppl (ONE TOUCH ULTRA MINI) w/Device KIT, 1 each by Does not apply route daily., Disp: 1 kit, Rfl: 0   Cholecalciferol (DIALYVITE VITAMIN D  5000) 125 MCG  (5000 UT) capsule, Take 5,000 Units by mouth daily., Disp: , Rfl:    Cyanocobalamin  (B-12 IJ), Inject as directed every 14 (fourteen) days., Disp: , Rfl:    cyclobenzaprine (FLEXERIL) 10 MG tablet, Take 10 mg by mouth at bedtime., Disp: , Rfl:    diphenhydrAMINE (BENADRYL) 25 MG tablet, Take 25 mg by mouth 2 (two) times daily., Disp: , Rfl:    DULoxetine (CYMBALTA) 30 MG capsule, Take 30 mg by mouth daily., Disp: , Rfl:    EPIPEN  2-PAK 0.3 MG/0.3ML SOAJ injection, Inject 0.3 mg into the muscle as needed for anaphylaxis., Disp: , Rfl:    esomeprazole (NEXIUM) 20 MG capsule, Take 40 mg by mouth daily at 12 noon., Disp: , Rfl:    estrogen, conjugated,-medroxyprogesterone (PREMPRO) 0.3-1.5 MG tablet, Take 1 tablet by mouth daily., Disp: , Rfl:    famotidine (PEPCID) 20 MG tablet, Take 20 mg by mouth at bedtime., Disp: , Rfl:    fluticasone-salmeterol (ADVAIR) 250-50 MCG/ACT AEPB, Inhale 1 puff into the lungs 2 (two) times daily as needed (chest congestion)., Disp: , Rfl:    furosemide (LASIX) 20 MG tablet, Take 20 mg by mouth  daily., Disp: , Rfl:    glipiZIDE  (GLUCOTROL  XL) 2.5 MG 24 hr tablet, TAKE 1 TABLET BY MOUTH  DAILY WITH BREAKFAST, Disp: 90 tablet, Rfl: 2   glucose blood (CONTOUR NEXT TEST) test strip, TEST TWICE DAILY, Disp: 200 strip, Rfl: 3   Iron Combinations (IRON COMPLEX PO), Take 20 mg by mouth daily., Disp: , Rfl:    JANUMET  50-1000 MG tablet, TAKE 1 TABLET BY MOUTH  TWICE DAILY, Disp: 180 tablet, Rfl: 2   Lancets (ONETOUCH ULTRASOFT) lancets, USE TO TEST ONCE DAILY, Disp: 100 each, Rfl: 3   LORazepam  (ATIVAN ) 2 MG tablet, Take 1 tablet (2 mg total) by mouth at bedtime., Disp: , Rfl:    losartan (COZAAR) 50 MG tablet, TAKE 1 TABLET BY MOUTH  DAILY, Disp: 90 tablet, Rfl: 2   magnesium oxide (MAG-OX) 400 (240 Mg) MG tablet, Take 400 mg by mouth daily., Disp: , Rfl:    ONETOUCH ULTRA test strip, USE TO CHECK BLOOD SUGAR  (FASTING) EVERY MORNING.  TEST UP TO 4 TIMES DAILY IF HAVING  HYPOGLYCEMIA., Disp: 400 each, Rfl: 3   pioglitazone  (ACTOS ) 45 MG tablet, TAKE 1 TABLET BY MOUTH AT  BEDTIME, Disp: 60 tablet, Rfl: 5   predniSONE (DELTASONE) 10 MG tablet, Take 10 mg by mouth 3 (three) times daily as needed (hives/itching)., Disp: , Rfl:    pregabalin  (LYRICA ) 100 MG capsule, Take 1 capsule (100 mg total) by mouth 3 (three) times daily. (Patient taking differently: Take 100 mg by mouth 2 (two) times daily.), Disp: 270 capsule, Rfl: 3   propranolol ER (INDERAL LA) 60 MG 24 hr capsule, Take 60 mg by mouth daily., Disp: , Rfl:    rosuvastatin (CRESTOR) 10 MG tablet, Take 10 mg by mouth daily., Disp: , Rfl:    Semaglutide (OZEMPIC, 0.25 OR 0.5 MG/DOSE, [place]), Inject into the skin once a week. Mondays, Disp: , Rfl:    spironolactone (ALDACTONE) 25 MG tablet, Take by mouth., Disp: , Rfl:    sucralfate (CARAFATE) 1 g tablet, Take by mouth., Disp: , Rfl:    zinc gluconate 50 MG tablet, Take 50 mg by mouth daily., Disp: , Rfl:    zolpidem  (AMBIEN ) 5 MG tablet, Take 1 tablet (5 mg total) by mouth at bedtime. (for peripheral neuropathy), Disp: , Rfl:   Social History   Tobacco Use  Smoking Status Never  Smokeless Tobacco Never  Tobacco Comments   tobacco use- no    Allergies  Allergen Reactions   Lisinopril Hives, Swelling, Itching and Rash    Angioedema  Angioedema    Angioedema Angioedema Angioedema Angioedema Angioedema  Angioedema  Angioedema  Angioedema  Angioedema  Angioedema  Angioedema, Angioedema, Angioedema, Angioedema, Angioedema  Angioedema  Angioedema  Angioedema  Angioedema  Angioedema  Angioedema  Angioedema    Angioedema Angioedema Angioedema Angioedema Angioedema  Angioedema  Angioedema  Angioedema   Objective:  There were no vitals filed for this visit. There is no height or weight on file to calculate BMI. Constitutional Well developed. Well nourished.  Vascular Dorsalis pedis pulses palpable bilaterally. Posterior tibial pulses  palpable bilaterally. Capillary refill normal to all digits.  No cyanosis or clubbing noted. Pedal hair growth normal.  Neurologic Normal speech. Oriented to person, place, and time. Epicritic sensation to light touch grossly present bilaterally.  Dermatologic Nails well groomed and normal in appearance. No open wounds. No skin lesions.  Orthopedic: Bilateral peroneal tendinitis pain along the course of the tendon pain primarily at the insertion.  Pain with resisted dorsiflexion eversion of the foot no pain with plantarflexion inversion of the foot.  No pain of the posterior tibial tendon ATFL ligament Achilles tendon   Radiographs: None Assessment:   1. Peroneal tendinitis of lower leg, right   2. Peroneal tendinitis of left lower extremity    Plan:  Patient was evaluated and treated and all questions answered.  Bilateral peroneal tendinitis - All questions and concerns were discussed with the patient in extensive detail - Given the amount of pain that she is having she will benefit from steroid injection help decrease inflammatory component of her pain.  Patient agrees with plan like to proceed with steroid injection -A steroid injection was performed at bilateral lateral foot a point of maximal tenderness using 1% plain Lidocaine  and 10 mg of Kenalog . This was well tolerated.   No follow-ups on file.

## 2024-02-15 NOTE — Progress Notes (Unsigned)
 Referring Physician:  Denton Lank, FNP [id number] [place],  [place] [address]  Primary Physician:  Marguarite Arbour, MD  History of Present Illness: 02/16/2024 [name] is here today with a chief complaint of back and right leg pain.  She is also suffering from diabetic neuropathy.  She has had back pain for many years but has been worsening with time.  It limits her ability to walk and go about her day-to-day life.  Standing and walking make it worse.  Sitting makes it better.  She has tried many conservative options without improvement.  Bowel/Bladder Dysfunction: none  Conservative measures:  Physical therapy:  has participated in at [facility] Multimodal medical therapy including regular antiinflammatories:  robaxin, lyrica, cymbalta, tylenol, flexeril, ibuprofen, prednisone,  Injections: has undergone  Past Surgery: none  [name] has no symptoms of cervical myelopathy.  The symptoms are causing a significant impact on the patient's life.   I have utilized the care everywhere function in epic to review the outside records available from external health systems.  Review of Systems:  A 10 point review of systems is negative, except for the pertinent positives and negatives detailed in the HPI.  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   Fatigue    Fibromuscular dysplasia (HCC)    Generalized headaches    GERD (gastroesophageal reflux disease)    Hemorrhoids    Hiatal hernia    Hypertension    Neuropathy    Palpitations    Pneumonia    Rectal bleeding    Rectal pain     Past Surgical History: Past Surgical History:  Procedure Laterality Date   Basal cell cancer  removal      colonoscopy  2010   EXCISION/RELEASE BURSA HIP Right 12/23/2022   Procedure: OPEN EXCISION OF RIGHT TROCHANTERIC BURSA;  Surgeon: Christena Flake, MD;  Location: [facility] ORS;  Service: Orthopedics;  Laterality: Right;   EXCISIONAL HEMORRHOIDECTOMY  03/2012   IR ANGIO EXTERNAL CAROTID SEL EXT CAROTID BILAT MOD SED  09/04/2021   IR ANGIO INTRA EXTRACRAN SEL INTERNAL CAROTID BILAT MOD SED  09/04/2021   IR ANGIO VERTEBRAL SEL SUBCLAVIAN INNOMINATE UNI L MOD SED  09/04/2021   IR ANGIO VERTEBRAL SEL VERTEBRAL UNI R MOD SED  09/04/2021   IR US GUIDE VASC ACCESS RIGHT  09/04/2021   right and left eye cataract surgery  2006, 2008   right in 2006 and left 2008   TUBAL LIGATION  1995    Allergies: Allergies as of 02/16/2024 - Review Complete 02/16/2024  Allergen Reaction Noted   Lisinopril Hives, Swelling, Itching, and Rash 03/12/2015    Medications:  Current Outpatient Medications:    acetaminophen (TYLENOL) 500 MG tablet, Take 1,000 mg by mouth every 6 (six) hours as needed for moderate pain., Disp: , Rfl:    Alcohol Swabs 70 % PADS, 1 each by Does not apply route 2 (two) times daily., Disp: 100 each, Rfl: 12   Alcohol Swabs PADS, Used to check blood sugars 3-4 times a day. Dx E11.9., Disp: 360 each, Rfl: 3   aspirin 81 MG tablet, Take 81 mg by mouth  daily., Disp: , Rfl:    Blood Glucose Monitoring Suppl (ONE TOUCH ULTRA MINI) w/Device KIT, 1 each by Does not apply route daily., Disp: 1 kit, Rfl: 0   Cholecalciferol (DIALYVITE VITAMIN D 5000) 125 MCG (5000 UT) capsule, Take 5,000 Units by mouth daily., Disp: , Rfl:    Cyanocobalamin (B-12 IJ), Inject as directed every 14 (fourteen) days., Disp: , Rfl:    cyclobenzaprine (FLEXERIL) 10 MG tablet, Take 10 mg by mouth at bedtime., Disp: , Rfl:    diphenhydrAMINE (BENADRYL) 25 MG tablet, Take 25 mg by mouth 2 (two) times daily., Disp: , Rfl:    DULoxetine (CYMBALTA) 30 MG capsule, Take 30 mg by mouth daily., Disp: , Rfl:    EPIPEN 2-PAK 0.3 MG/0.3ML SOAJ  injection, Inject 0.3 mg into the muscle as needed for anaphylaxis., Disp: , Rfl:    esomeprazole (NEXIUM) 20 MG capsule, Take 40 mg by mouth daily at 12 noon., Disp: , Rfl:    estrogen, conjugated,-medroxyprogesterone (PREMPRO) 0.3-1.5 MG tablet, Take 1 tablet by mouth daily., Disp: , Rfl:    famotidine (PEPCID) 20 MG tablet, Take 20 mg by mouth at bedtime., Disp: , Rfl:    fluticasone-salmeterol (ADVAIR) 250-50 MCG/ACT AEPB, Inhale 1 puff into the lungs 2 (two) times daily as needed (chest congestion). (Patient not taking: Reported on 07/29/2023), Disp: , Rfl:    furosemide (LASIX) 20 MG tablet, Take 20 mg by mouth daily., Disp: , Rfl:    glipiZIDE (GLUCOTROL XL) 2.5 MG 24 hr tablet, TAKE 1 TABLET BY MOUTH  DAILY WITH BREAKFAST, Disp: 90 tablet, Rfl: 2   glucose blood (CONTOUR NEXT TEST) test strip, TEST TWICE DAILY, Disp: 200 strip, Rfl: 3   Iron Combinations (IRON COMPLEX PO), Take 20 mg by mouth daily., Disp: , Rfl:    JANUMET 50-1000 MG tablet, TAKE 1 TABLET BY MOUTH  TWICE DAILY, Disp: 180 tablet, Rfl: 2   Lancets (ONETOUCH ULTRASOFT) lancets, USE TO TEST ONCE DAILY, Disp: 100 each, Rfl: 3   LORazepam (ATIVAN) 2 MG tablet, Take 1 tablet (2 mg total) by mouth at bedtime., Disp: , Rfl:    losartan (COZAAR) 50 MG tablet, TAKE 1 TABLET BY MOUTH  DAILY, Disp: 90 tablet, Rfl: 2   magnesium oxide (MAG-OX) 400 (240 Mg) MG tablet, Take 400 mg by mouth daily., Disp: , Rfl:    ONETOUCH ULTRA test strip, USE TO CHECK BLOOD SUGAR  (FASTING) EVERY MORNING.  TEST UP TO 4 TIMES DAILY IF HAVING HYPOGLYCEMIA., Disp: 400 each, Rfl: 3   pioglitazone (ACTOS) 45 MG tablet, TAKE 1 TABLET BY MOUTH AT  BEDTIME, Disp: 60 tablet, Rfl: 5   predniSONE (DELTASONE) 10 MG tablet, Take 10 mg by mouth 3 (three) times daily as needed (hives/itching)., Disp: , Rfl:    pregabalin (LYRICA) 100 MG capsule, Take 1 capsule (100 mg total) by mouth 3 (three) times daily. (Patient taking differently: Take 100 mg by mouth 2 (two) times  daily.), Disp: 270 capsule, Rfl: 3   propranolol ER (INDERAL LA) 60 MG 24 hr capsule, Take 60 mg by mouth daily., Disp: , Rfl:    rosuvastatin (CRESTOR) 10 MG tablet, Take 10 mg by mouth daily., Disp: , Rfl:    Semaglutide (OZEMPIC, 0.25 OR 0.5 MG/DOSE, Ruth), Inject into the skin once a week. Mondays, Disp: , Rfl:    zinc gluconate 50 MG tablet, Take 50 mg by mouth daily., Disp: , Rfl:    zolpidem (AMBIEN) 5 MG tablet, Take 1 tablet (5  mg total) by mouth at bedtime. (for peripheral neuropathy), Disp: , Rfl:   Social History: Social History   Tobacco Use   Smoking status: Never   Smokeless tobacco: Never   Tobacco comments:    tobacco use- no  Substance Use Topics   Alcohol use: No   Drug use: No    Family Medical History: Family History  Problem Relation Age of Onset   Heart failure Mother    COPD Mother    Hypertension Mother    Arthritis Mother    Cancer Father        prostate   Parkinson's disease Father    Colon cancer Father    Cancer Maternal Aunt        colon   Cancer Paternal Grandmother        stomach   Diabetes Brother    Diabetes Brother     Physical Examination: Vitals:   02/16/24 1111  BP: 128/64    General: Patient is in no apparent distress. Attention to examination is appropriate.  Neck:   Supple.  Full range of motion.  Respiratory: Patient is breathing without any difficulty.   NEUROLOGICAL:     Awake, alert, oriented to person, place, and time.  Speech is clear and fluent.   Cranial Nerves: Pupils equal round and reactive to light.  Facial tone is symmetric.  Facial sensation is symmetric. Shoulder shrug is symmetric. Tongue protrusion is midline.    Strength: Side Biceps Triceps Deltoid Interossei Grip Wrist Ext. Wrist Flex.  R 5 5 5 5 5 5 5   L 5 5 5 5 5 5 5    Side Iliopsoas Quads Hamstring PF DF EHL  R 5 5 5 5 5 5   L 5 5 5 5 5 5    Reflexes are 1+ and symmetric at the biceps, triceps, brachioradialis, patella and achilles.    Hoffman's is absent. Clonus is absent  Bilateral upper and lower extremity sensation is intact to light touch.     No evidence of dysmetria noted.  Gait is antalgic.    Imaging: MRI L spine 09/06/2023 L3-4: There is a shallow broad-based disc bulge, ligamentum flavum thickening and mild to moderate facet degenerative change. Mild to moderate central canal stenosis has progressed since the prior MRI. The foramina are open.   L4-5: Moderate to moderately severe facet degenerative change with a shallow disc bulge to the left ligamentum flavum thickening. There is mild narrowing in the left lateral recess. The foramina open.   L5-S1: Moderate bilateral facet arthropathy and a shallow disc bulge. No stenosis.   IMPRESSION: 1. Mild to moderate central canal stenosis at L3-4 has progressed since the prior MRI. No nerve root compression. 2. Mild narrowing in the left lateral recess at L4-5 is new since the prior MRI. No nerve root compression. 3. Convex left scoliosis.     Electronically Signed   By: Drusilla Kanner M.D.   On: 09/29/2023 09:05    I have personally reviewed the images and agree with the above interpretation.  Medical Decision Making/Assessment and Plan: Ms. Derosa is a pleasant 68 y.o. female with chronic back pain with right-sided sciatica as well as painful diabetic neuropathy.  She has abnormalities at L3-4 that have worsened over time.  She has moderate stenosis at that level and severe right sided facet arthrosis.  We discussed the option of L3-4 intervention, but I feel that she is also a candidate for spinal cord stimulator evaluation.  She would like to  pursue stimulator evaluation.  If given her information and will make appropriate referrals.  I spent a total of 30 minutes in this patient's care today. This time was spent reviewing pertinent records including imaging studies, obtaining and confirming history, performing a directed evaluation, formulating and  discussing my recommendations, and documenting the visit within the medical record.     Thank you for involving me in the care of this patient.    Jodeen Munch MD Neurosurgery

## 2024-02-16 ENCOUNTER — Encounter: Payer: Self-pay | Admitting: Neurosurgery

## 2024-02-16 ENCOUNTER — Ambulatory Visit: Admitting: Neurosurgery

## 2024-02-16 VITALS — BP 128/64 | Ht 68.0 in | Wt 249.0 lb

## 2024-02-16 DIAGNOSIS — G8929 Other chronic pain: Secondary | ICD-10-CM

## 2024-02-16 DIAGNOSIS — E1142 Type 2 diabetes mellitus with diabetic polyneuropathy: Secondary | ICD-10-CM | POA: Diagnosis not present

## 2024-02-16 DIAGNOSIS — M5441 Lumbago with sciatica, right side: Secondary | ICD-10-CM

## 2024-02-16 NOTE — Patient Instructions (Signed)

## 2024-02-25 ENCOUNTER — Ambulatory Visit
Admission: RE | Admit: 2024-02-25 | Discharge: 2024-02-25 | Disposition: A | Discharge status: Home / Self Care | Source: Ambulatory Visit | Attending: Neurosurgery | Admitting: Neurosurgery

## 2024-02-25 DIAGNOSIS — G8929 Other chronic pain: Secondary | ICD-10-CM

## 2024-02-25 DIAGNOSIS — E1142 Type 2 diabetes mellitus with diabetic polyneuropathy: Secondary | ICD-10-CM

## 2024-03-20 ENCOUNTER — Encounter: Payer: Self-pay | Admitting: Student in an Organized Health Care Education/Training Program

## 2024-03-20 ENCOUNTER — Encounter (INDEPENDENT_AMBULATORY_CARE_PROVIDER_SITE_OTHER): Payer: Self-pay

## 2024-03-20 ENCOUNTER — Ambulatory Visit: Admitting: Student in an Organized Health Care Education/Training Program

## 2024-03-20 ENCOUNTER — Ambulatory Visit
Admission: RE | Admit: 2024-03-20 | Discharge: 2024-03-20 | Disposition: A | Discharge status: Home / Self Care | Source: Ambulatory Visit | Attending: Student in an Organized Health Care Education/Training Program | Admitting: Student in an Organized Health Care Education/Training Program

## 2024-03-20 ENCOUNTER — Other Ambulatory Visit: Payer: Self-pay | Admitting: Student in an Organized Health Care Education/Training Program

## 2024-03-20 ENCOUNTER — Encounter: Payer: Self-pay | Admitting: Neurosurgery

## 2024-03-20 VITALS — BP 131/48 | HR 69 | Temp 97.6°F | Resp 16 | Ht 68.0 in | Wt 246.0 lb

## 2024-03-20 DIAGNOSIS — E1142 Type 2 diabetes mellitus with diabetic polyneuropathy: Secondary | ICD-10-CM | POA: Diagnosis present

## 2024-03-20 DIAGNOSIS — E1140 Type 2 diabetes mellitus with diabetic neuropathy, unspecified: Secondary | ICD-10-CM | POA: Diagnosis present

## 2024-03-20 DIAGNOSIS — R52 Pain, unspecified: Secondary | ICD-10-CM | POA: Insufficient documentation

## 2024-03-20 DIAGNOSIS — M48061 Spinal stenosis, lumbar region without neurogenic claudication: Secondary | ICD-10-CM | POA: Diagnosis present

## 2024-03-20 DIAGNOSIS — M47816 Spondylosis without myelopathy or radiculopathy, lumbar region: Secondary | ICD-10-CM | POA: Insufficient documentation

## 2024-03-20 DIAGNOSIS — G894 Chronic pain syndrome: Secondary | ICD-10-CM | POA: Diagnosis present

## 2024-03-20 DIAGNOSIS — Z7984 Long term (current) use of oral hypoglycemic drugs: Secondary | ICD-10-CM

## 2024-03-20 HISTORY — DX: Spinal stenosis, lumbar region without neurogenic claudication: M48.061

## 2024-03-20 HISTORY — DX: Chronic pain syndrome: G89.4

## 2024-03-20 HISTORY — DX: Spondylosis without myelopathy or radiculopathy, lumbar region: M47.816

## 2024-03-20 NOTE — Patient Instructions (Addendum)
 Spinal Cord Stimulation Trial Information A spinal cord stimulation trial is a test to see whether a spinal cord stimulator reduces your pain. A spinal cord stimulator is a small device that is inserted (implanted) in your back. The stimulator has small wires (leads) that connect it to your spinal cord. The stimulator sends electrical pulses through the leads to the spinal cord. This can relieve pain. Settings for the stimulator can be adjusted with a remote device to find the best pain control. Your health care provider may suggest a spinal cord stimulation trial if other treatments for long-lasting (chronic) pain have not worked for you. Spinal cord stimulation may be used to manage pain that is caused by: Failed back surgery. Heart pain (angina) or a blood vessel problem (peripheral vascular disease). Pain after amputation (phantom limb sensation). Nerve-related pain. Complex regional pain syndrome. Painful inflammation of a thin membrane that covers the brain and spinal cord (arachnoiditis). Other syndromes that involve chronic pain or injuries related to the spinal cord. For the trial, the stimulator is attached to your back instead of inserted under the skin. A trial period is usually 3-5 days, but this can vary among health care providers. After your trial period, you and your health care provider will discuss whether a permanent spinal cord stimulator is an option for you. The permanent stimulator may be an option depending on: Whether the stimulator reduces your pain during the trial. Whether the stimulator fits into your lifestyle. Whether the cost of the stimulator is covered by your insurance. What are the risks? Generally, a spinal cord stimulation trial is safe. However, problems can occur, including: Bleeding or pain at the insertion site of the leads. Infection at the insertion site or around the leads. Allergic reactions to medicines, devices, or dyes. Damage to the skin, nerves,  back muscles, or spinal cord where the leads are placed. Inability to move the legs (paralysis). Numbness in the legs. Inability to control when you urinate or have a bowel movement (incontinence). Spinal fluid leakage. How is a spinal cord stimulator placed for a trial?  For a trial period, the stimulator generator and battery will be outside the body, typically on a belt that you will wear around your waist. Only the leads that connect the stimulator to the spinal cord are implanted under your skin. The exact location of the stimulator depends on where you have pain. There are two types of surgery for implanting the leads: Noninvasive surgery. In this type of surgery, a small incision is made and needles are used to place the leads under your skin. Open surgery. In this type of surgery, a larger incision is made, and the leads are implanted directly into your back. How to care for yourself during the trial period Incision care Check your incisions every day for signs of infection. Check for: Redness, swelling, or pain. Fluid or blood. Warmth. Pus or a bad smell. Activity Return to your normal activities as told by your health care provider. Ask your health care provider what activities are safe for you. You may have to avoid lifting. Ask your health care provider how much you can safely lift. General instructions Follow your health care provider's instructions about how to take care of your spinal cord stimulator and your incision. Make sure you write down the following information so that you can share this information with your health care provider: Your responses to the stimulator. Describe these as told by your health care provider. Your pain level throughout  the day. The amount and kind of pain medicine that you take. Do not take baths, swim, or use a hot tub until your health care provider approves. Ask your health care provider if you may take showers. You may only be allowed to  take sponge baths. Take over-the-counter and prescription medicines only as told by your health care provider. Tell all health care providers who provide care for you that you have a spinal cord stimulator. This is important information that could affect the medical treatment that you receive. Keep all follow-up visits. This is important. Contact a health care provider if: You have any of these signs of infection: Redness, swelling, or pain around your incision. Fluid or blood coming from your incision. Warmth coming from your incision. Pus or a bad smell coming from your incision. A fever. Get help right away if: Your pain gets worse. The stimulator leads come out. You develop numbness or weakness in your legs, or you have difficulty walking. You have problems urinating or having a bowel movement. You have symptoms that last for more than 2-3 days or your symptoms suddenly get worse. These symptoms may be an emergency. Get help right away. Call 911. Do not wait to see if the symptoms will go away. Do not drive yourself to the hospital. Summary A spinal cord stimulator is a small device that sends electrical pulses to your spinal cord. This can relieve pain caused by many different health conditions. Before a permanent stimulator is placed, you will have a trial using a temporary stimulator that is not implanted under your skin. This helps determine if a stimulator will reduce your pain. For the trial, only the leads that connect the stimulator to the spinal cord are implanted under your skin. During the trial period, make sure you write down information about your pain and your responses to the stimulator so that you can share this information with your health care provider. Keep all follow-up visits. This is important. Contact your health care provider if you have problems during the trial. This information is not intended to replace advice given to you by your health care provider. Make  sure you discuss any questions you have with your health care provider. Document Revised: 07/21/2021 Document Reviewed: 07/21/2021 Elsevier Patient Education  2024 Else[employer] Inc[employer]Capsaicin Topical System What is this medication? CAPSAICIN (cap SAY sin) treats nerve pain. It works by making your skin feel warm or cool, which blocks pain signals going to the brain. This medicine may be used for other purposes; ask your health care provider or pharmacist if you have questions. COMMON BRAND NAME(S): Qutenza What should I tell my care team before I take this medication? They need to know if you have any of these conditions: Have had a heart attack or stroke High blood pressure Large areas of burned or damaged skin An unusual or allergic reaction to capsaicin, hot peppers, other medications, foods, dyes, or preservatives Pregnant or trying to get pregnant Breastfeeding How should I use this medication? This medication is for external use only. It is applied by your care team in a hospital or clinic setting. Talk to your care team about the use of this medication in children. Special care may be needed. Overdosage: If you think you have taken too much of this medicine contact a poison control center or emergency room at once. NOTE: This medicine is only for you. Do not share this medicine with others. What if I miss a dose? This does not  apply. What may interact with this medication? Interactions are not expected. Do not use any other skin products on the affected area without asking your care team. This list may not describe all possible interactions. Give your health care provider a list of all the medicines, herbs, non-prescription drugs, or dietary supplements you use. Also tell them if you smoke, drink alcohol , or use illegal drugs. Some items may interact with your medicine. What should I watch for while using this medication? Your condition will be monitored carefully while you are receiving  this medication. Tell your care team if your symptoms do not start to get better or if they get worse. Talk to your care team about how to treat discomfort. You may place a cooling pack from the refrigerator (not the freezer) on the area. Do not place it directly on the skin. Try not to touch the area where the patch was applied. If you do, wash your hands with soap and water right away. Your skin may be sensitive to heat for a few days after treatment. Avoid hot baths or showers, heating pads, and direct sunlight on the treated area. What side effects may I notice from receiving this medication? Side effects that you should report to your care team as soon as possible: Allergic reactions--skin rash, itching, hives, swelling of the face, lips, tongue, or throat Burning, itching, crusting, or peeling of treated skin Increase in blood pressure Numbness, decrease in sense of touch or sensation Side effects that usually do not require medical attention (report these to your care team if they continue or are bothersome): Mild skin irritation, redness, or dryness This list may not describe all possible side effects. Call your doctor for medical advice about side effects. You may report side effects to FDA at 1-800-FDA-1088. Where should I keep my medication? This medication is given in a hospital or clinic. It will not be stored at home. NOTE: This sheet is a summary. It may not cover all possible information. If you have questions about this medicine, talk to your doctor, pharmacist, or health care provider.  2024 Elsevier/Gold Standard (2023-09-30 00:00:00)Schedule Qutenza 5/28 at 830 am  (schedule during my SCS trial that day) Schedule for [employer] SCS trial with sedation June 4th Will give you antimicrobial scrub to wash your back

## 2024-03-20 NOTE — Progress Notes (Signed)
 PROVIDER NOTE: Interpretation of information contained herein should be left to medically-trained personnel. Specific patient instructions are provided elsewhere under "Patient Instructions" section of medical record. This document was created in part using AI and STT-dictation technology, any transcriptional errors that may result from this process are unintentional.  Patient: Melody Brooks  Service: E/M Encounter  Provider: Cephus Collin, MD  DOB: 11-01-1956  Delivery: Face-to-face  Specialty: Interventional Pain Management  MRN: [id number]  Setting: Ambulatory outpatient facility  Specialty designation: 09  Type: New Patient  Location: Outpatient office facility  PCP: Yehuda Helms, MD  DOS: 03/20/2024    Referring Prov.: Jodeen Munch, MD   Primary Reason(s) for Visit: Encounter for initial evaluation of one or more chronic problems (new to examiner) potentially causing chronic pain, and posing a threat to normal musculoskeletal function. (Level of risk: High) CC: Back Pain and Hip Pain (Right )  HPI  [name] is a 68 y.o. year old, female patient, who comes for the first time to our practice referred by Jodeen Munch, MD for our initial evaluation of her chronic pain. She has Hyperlipemia; PALPITATIONS; DYSPNEA; ABNORMAL EKG; Internal hemorrhoids with complication; Left arm pain; Obesity; Essential hypertension; Angio-edema; Diabetes mellitus with polyneuropathy (HCC); Accumulation of fluid in tissues; Arterial fibromuscular dysplasia (HCC); Acid reflux; Idiopathic scoliosis; Insomnia; Lumbosacral radiculitis; Anemia, iron deficiency; Idiopathic peripheral neuropathy; Inflammation with thrombosis; Bursitis, trochanteric; B12 deficiency; Avitaminosis D; Carotid stenosis; Morbid obesity with BMI of 40.0-44.9, adult (HCC); Dermatochalasis of eyelid; Ptosis, both eyelids; Visual field defect; Basal cell carcinoma (BCC); Chronic urticaria; Type 2 diabetes mellitus (HCC); Pain; Chronic painful  diabetic neuropathy (HCC); Spinal stenosis 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; Lumbar facet arthropathy; and Chronic pain syndrome on their problem list. Today she comes in for evaluation of her Back Pain and Hip Pain (Right )  Pain Assessment: Location: Lower, Right, Left Back Radiating: Patient has low back pain bilateral and into buttocks bilateral. Sometimes radiates into right hip. Pain worse throughout the day. Onset: More than a month ago Duration: Chronic pain Quality: Constant, Dull, Pressure, Numbness, Shooting, Throbbing, Stabbing (Annoying, deep, agonizing) Severity: 4 /10 (subjective, self-reported pain score)  Effect on ADL: Limits ADLs. Limits quality because have to sit down often. Timing: Constant Modifying factors: Sitting, ice, heat, and rest BP: (!) 131/48  HR: 69  Onset and Duration: Gradual Cause of pain: Work related accident or event Severity: Getting worse, NAS-11 at its worse: 10/10, NAS-11 at its best: 4/10, NAS-11 now: 4/10, and NAS-11 on the average: 8/10 Timing: Not influenced by the time of the day and During activity or exercise Aggravating Factors: Climbing, Lifiting, Motion, Prolonged standing, Stooping , Twisting, Walking, Walking uphill, Walking downhill, and Working Alleviating Factors: Stretching, Cold packs, Hot packs, Lying down, Resting, Sitting, Sleeping, and TENS Quality of Pain: Aching, Agonizing, Burning, Constant, Cruel, Deep, Disabling, Distressing, Feeling of constriction, Heavy, Nagging, Pressure-like, Pulsating, Sharp, Shooting, Splitting, Stabbing, Throbbing, and Uncomfortable Previous Examinations or Tests: CT scan, MRI scan, Nerve block, Neurological evaluation, and Psychiatric evaluation Previous Treatments: Epidural steroid injections, Physical Therapy, Steroid treatments by mouth, and TENS  Ms. Wool is being evaluated for possible interventional pain management therapies for the treatment of her chronic pain.    Discussed the use of AI scribe software for clinical note transcription with the patient, who gave verbal consent to proceed.  History of Present Illness   [name] "Clide Dalton" is a 68 year old female with chronic low back pain and type 2 diabetes  who presents for evaluation of her pain management options. She is accompanied by her husband, Josefina Nian. She was referred by Dr. Mont Antis for evaluation of her chronic low back pain.  She has experienced low back pain for over ten years, which has progressively worsened. The pain is primarily located in the right buttock and hip, with recent involvement of the left side. She has tried physical therapy and frequently used Advil for pain relief, but her nephrologist advised discontinuation due to kidney concerns. The pain causes heaviness in her buttocks and lower back when walking, necessitating frequent rest and significantly impacting daily activities such as shopping.  She has type 2 diabetes, managed with Janumet  and pioglitazone , and experiences burning and tingling in her feet. She is not on insulin. For neuropathy, she takes Lyrica  100 mg three times a day. Despite the neuropathy, her diabetes is well-controlled.  A lumbar MRI revealed moderate canal stenosis at L3-L4 and some scoliosis in the thoracic spine. She is not on any blood thinners except for a daily aspirin of 81 mg.  No significant issues with her vision. No heaviness in her legs when walking, but she does experience heaviness in her buttocks and lower back.       Meds   Current Outpatient Medications:    acetaminophen  (TYLENOL ) 500 MG tablet, Take 1,000 mg by mouth every 6 (six) hours as needed for moderate pain., Disp: , Rfl:    Alcohol  Swabs  70 % PADS, 1 each by Does not apply route 2 (two) times daily., Disp: 100 each, Rfl: 12   Alcohol  Swabs  PADS, Used to check blood sugars 3-4 times a day. Dx E11.9., Disp: 360 each, Rfl: 3   amLODipine (NORVASC) 5 MG tablet, Take 5 mg by  mouth daily., Disp: , Rfl:    aspirin 81 MG tablet, Take 81 mg by mouth daily., Disp: , Rfl:    Blood Glucose Monitoring Suppl (ONE TOUCH ULTRA MINI) w/Device KIT, 1 each by Does not apply route daily., Disp: 1 kit, Rfl: 0   Cholecalciferol (DIALYVITE VITAMIN D  5000) 125 MCG (5000 UT) capsule, Take 5,000 Units by mouth daily., Disp: , Rfl:    Cyanocobalamin  (B-12 IJ), Inject as directed every 14 (fourteen) days., Disp: , Rfl:    cyclobenzaprine (FLEXERIL) 10 MG tablet, Take 10 mg by mouth at bedtime., Disp: , Rfl:    diphenhydrAMINE (BENADRYL) 25 MG tablet, Take 25 mg by mouth 2 (two) times daily., Disp: , Rfl:    EPIPEN  2-PAK 0.3 MG/0.3ML SOAJ injection, Inject 0.3 mg into the muscle as needed for anaphylaxis., Disp: , Rfl:    esomeprazole (NEXIUM) 20 MG capsule, Take 40 mg by mouth daily at 12 noon., Disp: , Rfl:    estrogen, conjugated,-medroxyprogesterone (PREMPRO) 0.3-1.5 MG tablet, Take 1 tablet by mouth daily., Disp: , Rfl:    famotidine (PEPCID) 20 MG tablet, Take 20 mg by mouth at bedtime., Disp: , Rfl:    fluticasone-salmeterol (ADVAIR) 250-50 MCG/ACT AEPB, Inhale 1 puff into the lungs 2 (two) times daily as needed (chest congestion)., Disp: , Rfl:    furosemide (LASIX) 20 MG tablet, Take 20 mg by mouth daily., Disp: , Rfl:    glipiZIDE  (GLUCOTROL  XL) 2.5 MG 24 hr tablet, TAKE 1 TABLET BY MOUTH  DAILY WITH BREAKFAST, Disp: 90 tablet, Rfl: 2   glucose blood (CONTOUR NEXT TEST) test strip, TEST TWICE DAILY, Disp: 200 strip, Rfl: 3   Iron Combinations (IRON COMPLEX PO), Take 20 mg by mouth daily., Disp: , Rfl:  JANUMET  50-1000 MG tablet, TAKE 1 TABLET BY MOUTH  TWICE DAILY, Disp: 180 tablet, Rfl: 2   Lancets (ONETOUCH ULTRASOFT) lancets, USE TO TEST ONCE DAILY, Disp: 100 each, Rfl: 3   LORazepam  (ATIVAN ) 2 MG tablet, Take 1 tablet (2 mg total) by mouth at bedtime., Disp: , Rfl:    losartan (COZAAR) 50 MG tablet, TAKE 1 TABLET BY MOUTH  DAILY, Disp: 90 tablet, Rfl: 2   magnesium oxide  (MAG-OX) 400 (240 Mg) MG tablet, Take 400 mg by mouth daily., Disp: , Rfl:    ONETOUCH ULTRA test strip, USE TO CHECK BLOOD SUGAR  (FASTING) EVERY MORNING.  TEST UP TO 4 TIMES DAILY IF HAVING HYPOGLYCEMIA., Disp: 400 each, Rfl: 3   pioglitazone  (ACTOS ) 45 MG tablet, TAKE 1 TABLET BY MOUTH AT  BEDTIME, Disp: 60 tablet, Rfl: 5   predniSONE (DELTASONE) 10 MG tablet, Take 10 mg by mouth 3 (three) times daily as needed (hives/itching)., Disp: , Rfl:    pregabalin  (LYRICA ) 100 MG capsule, Take 1 capsule (100 mg total) by mouth 3 (three) times daily. (Patient taking differently: Take 100 mg by mouth 2 (two) times daily.), Disp: 270 capsule, Rfl: 3   propranolol ER (INDERAL LA) 60 MG 24 hr capsule, Take 60 mg by mouth daily., Disp: , Rfl:    rosuvastatin (CRESTOR) 10 MG tablet, Take 10 mg by mouth daily., Disp: , Rfl:    Semaglutide (OZEMPIC, 0.25 OR 0.5 MG/DOSE, Lindsay), Inject into the skin once a week. Mondays, Disp: , Rfl:    spironolactone (ALDACTONE) 25 MG tablet, Take by mouth., Disp: , Rfl:    sucralfate (CARAFATE) 1 g tablet, Take by mouth., Disp: , Rfl:    zinc gluconate 50 MG tablet, Take 50 mg by mouth daily., Disp: , Rfl:    zolpidem  (AMBIEN ) 5 MG tablet, Take 1 tablet (5 mg total) by mouth at bedtime. (for peripheral neuropathy), Disp: , Rfl:    DULoxetine (CYMBALTA) 30 MG capsule, Take 30 mg by mouth daily. (Patient not taking: Reported on 03/20/2024), Disp: , Rfl:   Imaging Review   MR THORACIC SPINE WO CONTRAST  Narrative CLINICAL DATA:  Chronic pain syndrome  EXAM: MRI THORACIC SPINE WITHOUT CONTRAST  TECHNIQUE: Multiplanar, multisequence MR imaging of the thoracic spine was performed. No intravenous contrast was administered.  COMPARISON:  None Available.  FINDINGS: Alignment:  Mild dextrocurvature of the thoracic spine.  Vertebrae: No fracture, evidence of discitis, or bone lesion. Schmorl's nodes at several levels.  Cord:  Normal signal and morphology.  Paraspinal and  other soft tissues: Right renal cyst.  Disc levels:  Mild canal and foraminal stenoses at a few levels secondary to disc bulging and facet arthropathy.  IMPRESSION: Mild dextrocurvature of the thoracic spine. Mild canal and foraminal stenoses at a few levels secondary to disc bulging and facet arthropathy.   Electronically Signed By: Johnanna Mylar M.D. On: 03/19/2024 14:21  MR LUMBAR SPINE WO CONTRAST  Narrative CLINICAL DATA:  Increasing low back pain with weakness and numbness in both feet over the past 7 months. No known injury.  EXAM: MRI LUMBAR SPINE WITHOUT CONTRAST  TECHNIQUE: Multiplanar, multisequence MR imaging of the lumbar spine was performed. No intravenous contrast was administered.  COMPARISON:  MRI lumbar spine 06/12/2019.  FINDINGS: Segmentation:  Standard.  Alignment:  Convex left scoliosis again seen.  No listhesis.  Vertebrae:  No fracture, evidence of discitis, or bone lesion.  Conus medullaris and cauda equina: Conus extends to the L1 level. Conus  and cauda equina appear normal.  Paraspinal and other soft tissues: Negative.  Disc levels:  T11-12 is imaged in the sagittal plane only and negative.  T12-L1: Negative.  L1-2: Minimal disc bulge and mild facet degenerative disease. No stenosis.  L2-3: Shallow disc bulge and mild facet degenerative disease without stenosis.  L3-4: There is a shallow broad-based disc bulge, ligamentum flavum thickening and mild to moderate facet degenerative change. Mild to moderate central canal stenosis has progressed since the prior MRI. The foramina are open.  L4-5: Moderate to moderately severe facet degenerative change with a shallow disc bulge to the left ligamentum flavum thickening. There is mild narrowing in the left lateral recess. The foramina open.  L5-S1: Moderate bilateral facet arthropathy and a shallow disc bulge. No stenosis.  IMPRESSION: 1. Mild to moderate central canal stenosis at  L3-4 has progressed since the prior MRI. No nerve root compression. 2. Mild narrowing in the left lateral recess at L4-5 is new since the prior MRI. No nerve root compression. 3. Convex left scoliosis.   Electronically Signed By: Etheleen Her M.D. On: 09/29/2023 09:05   MR HIP RIGHT WO CONTRAST  Narrative CLINICAL DATA:  Chronic right hip pain  EXAM: MR OF THE RIGHT HIP WITHOUT CONTRAST  TECHNIQUE: Multiplanar, multisequence MR imaging was performed. No intravenous contrast was administered.  COMPARISON:  MRI 06/12/2019  FINDINGS: Bones: No acute fracture. No dislocation. Joint space narrowing with femoral and acetabular osteophytes of both hips. No femoral head avascular necrosis. Bony pelvis intact without diastasis. SI joints and pubic symphysis within normal limits. Degenerative disc disease of L3-4 with associated discogenic endplate marrow edema. No there is facet arthropathy of the included lower lumbar spine. No extra-articular sites of bone marrow edema. No marrow replacing bone lesion.  Articular cartilage and labrum  Articular cartilage: Mild chondral thinning without focal defect. No subchondral marrow signal changes.  Labrum:  Superior labrum appears degenerated.  No paralabral cyst.  Joint or bursal effusion  Joint effusion:  None.  Bursae: Moderate sized right-sided peritrochanteric bursal fluid collection measuring up to 5.0 cm in craniocaudal dimension. No additional bursal fluid collections about the hips.  Muscles and tendons  Muscles and tendons: Mild tendinosis of the right gluteus medius and minimus tendons. The hamstring, iliopsoas, rectus femoris, and adductor tendons appear intact without tear or significant tendinosis. Normal muscle bulk and signal intensity without edema, atrophy, or fatty infiltration.  Other findings  Miscellaneous:   No soft tissue edema.  No inguinal lymphadenopathy.  IMPRESSION: 1. Moderate  right-sided trochanteric bursitis. 2. Mild right gluteus medius and minimus tendinosis. 3. Mild bilateral hip osteoarthritis. 4. Degenerative disc disease of L3-4, progressed from 2020.   Electronically Signed By: Leverne Reading D.O. On: 04/20/2022 14:16    Complexity Note: Imaging results reviewed.                         ROS  Cardiovascular: Daily Aspirin intake and High blood pressure Pulmonary or Respiratory: No reported pulmonary signs or symptoms such as wheezing and difficulty taking a deep full breath (Asthma), difficulty blowing air out (Emphysema), coughing up mucus (Bronchitis), persistent dry cough, or temporary stoppage of breathing during sleep Neurological: Abnormal skin sensations (Peripheral Neuropathy) Psychological-Psychiatric: No reported psychological or psychiatric signs or symptoms such as difficulty sleeping, anxiety, depression, delusions or hallucinations (schizophrenial), mood swings (bipolar disorders) or suicidal ideations or attempts Gastrointestinal: Reflux or heatburn and Irregular, infrequent bowel movements (Constipation) Genitourinary: Kidney disease  Hematological: Weakness due to low blood hemoglobin or red blood cell count (Anemia) Endocrine: High blood sugar controlled without the use of insulin (NIDDM) Rheumatologic: No reported rheumatological signs and symptoms such as fatigue, joint pain, tenderness, swelling, redness, heat, stiffness, decreased range of motion, with or without associated rash Musculoskeletal: Negative for myasthenia gravis, muscular dystrophy, multiple sclerosis or malignant hyperthermia Work History: Retired  Allergies  Ms. Betterton is allergic to lisinopril.  Laboratory Chemistry Profile   Renal Lab Results  Component Value Date   BUN 20 09/04/2021   CREATININE 1.20 (H) 09/04/2021   BCR 20 09/10/2020   GFRAA 67 09/10/2020   GFRNONAA 50 (L) 09/04/2021   SPECGRAV 1.015 05/17/2019   PHUR 7.5 05/17/2019   PROTEINUR  Positive (A) 05/17/2019     Electrolytes Lab Results  Component Value Date   NA 139 09/04/2021   K 4.7 09/04/2021   CL 104 09/04/2021   CALCIUM 9.3 09/04/2021     Hepatic Lab Results  Component Value Date   AST 18 09/10/2020   ALT 17 09/10/2020   ALBUMIN 4.2 09/10/2020   ALKPHOS 78 09/10/2020     ID Lab Results  Component Value Date   HIV Non Reactive 12/02/2017     Bone Lab Results  Component Value Date   VD25OH 35.4 12/02/2017     Endocrine Lab Results  Component Value Date   GLUCOSE 139 (H) 09/04/2021   HGBA1C 5.7 (H) 09/10/2020   TSH 3.400 09/10/2020     Neuropathy Lab Results  Component Value Date   VITAMINB12 1,950 (H) 12/02/2017   HGBA1C 5.7 (H) 09/10/2020   HIV Non Reactive 12/02/2017     CNS No results found for: "COLORCSF", "APPEARCSF", "RBCCOUNTCSF", "WBCCSF", "POLYSCSF", "LYMPHSCSF", "EOSCSF", "PROTEINCSF", "GLUCCSF", "JCVIRUS", "CSFOLI", "IGGCSF", "LABACHR", "ACETBL"   Inflammation (CRP: Acute  ESR: Chronic) No results found for: "CRP", "ESRSEDRATE", "LATICACIDVEN"   Rheumatology No results found for: "RF", "ANA", "LABURIC", "URICUR", "LYMEIGGIGMAB", "LYMEABIGMQN", "HLAB27"   Coagulation Lab Results  Component Value Date   INR 1.0 09/04/2021   LABPROT 13.1 09/04/2021   PLT 231 09/04/2021     Cardiovascular Lab Results  Component Value Date   HGB 11.3 (L) 09/04/2021   HCT 35.0 (L) 09/04/2021     Screening Lab Results  Component Value Date   HIV Non Reactive 12/02/2017     Cancer No results found for: "CEA", "CA125", "LABCA2"   Allergens No results found for: "ALMOND", "APPLE", "ASPARAGUS", "AVOCADO", "BANANA", "BARLEY", "BASIL", "BAYLEAF", "GREENBEAN", "LIMABEAN", "WHITEBEAN", "BEEFIGE", "REDBEET", "BLUEBERRY", "BROCCOLI", "CABBAGE", "MELON", "CARROT", "CASEIN", "CASHEWNUT", "CAULIFLOWER", "CELERY"     Note: Lab results reviewed.  PFSH  Drug: Ms. Yera  reports no history of drug use. Alcohol :  reports no history of  alcohol  use. Tobacco:  reports that she has never smoked. She has never used smokeless tobacco. Medical:  has a past medical history of Anemia, Aneurysm (HCC), Angioedema, Arthritis, Cancer (HCC), Constipation, Diabetes mellitus, Fatigue, Fibromuscular dysplasia (HCC), Generalized headaches, GERD (gastroesophageal reflux disease), Hemorrhoids, Hiatal hernia, Hypertension, Neuropathy, Palpitations, Pneumonia, Rectal bleeding, and Rectal pain. Family: family history includes Arthritis in her mother; COPD in her mother; Cancer in her father, maternal aunt, and paternal grandmother; Colon cancer in her father; Diabetes in her brother and brother; Heart failure in her mother; Hypertension in her mother; Parkinson's disease in her father.  Past Surgical History:  Procedure Laterality Date   Basal cell cancer  removal     colonoscopy  2010   EXCISION/RELEASE BURSA HIP Right 12/23/2022  Procedure: OPEN EXCISION OF RIGHT TROCHANTERIC BURSA;  Surgeon: Elner Hahn, MD;  Location: [facility] ORS;  Service: Orthopedics;  Laterality: Right;   EXCISIONAL HEMORRHOIDECTOMY  03/2012   IR ANGIO EXTERNAL CAROTID SEL EXT CAROTID BILAT MOD SED  09/04/2021   IR ANGIO INTRA EXTRACRAN SEL INTERNAL CAROTID BILAT MOD SED  09/04/2021   IR ANGIO VERTEBRAL SEL SUBCLAVIAN INNOMINATE UNI L MOD SED  09/04/2021   IR ANGIO VERTEBRAL SEL VERTEBRAL UNI R MOD SED  09/04/2021   IR US  GUIDE VASC ACCESS RIGHT  09/04/2021   right and left eye cataract surgery  2006, 2008   right in 2006 and left 2008   TUBAL LIGATION  1995   Active Ambulatory Problems    Diagnosis Date Noted   Hyperlipemia 12/04/2010   PALPITATIONS 12/04/2010   DYSPNEA 12/04/2010   ABNORMAL EKG 12/04/2010   Internal hemorrhoids with complication 04/07/2012   Left arm pain 03/12/2015   Obesity 03/12/2015   Essential hypertension 03/12/2015   Angio-edema 05/24/2013   Diabetes mellitus with polyneuropathy (HCC) 07/17/2003   Accumulation of fluid in tissues 07/08/2015    Arterial fibromuscular dysplasia (HCC) 07/08/2015   Acid reflux 07/08/2015   Idiopathic scoliosis 07/30/2002   Insomnia 03/01/2008   Lumbosacral radiculitis 12/11/2014   Anemia, iron deficiency 03/06/2008   Idiopathic peripheral neuropathy 12/23/2003   Inflammation with thrombosis 04/23/2010   Bursitis, trochanteric 12/12/2014   B12 deficiency 03/06/2008   Avitaminosis D 03/06/2008   Carotid stenosis 12/16/2017   Morbid obesity with BMI of 40.0-44.9, adult (HCC) 03/10/2018   Dermatochalasis of eyelid 08/14/2018   Ptosis, both eyelids 08/14/2018   Visual field defect 08/14/2018   Basal cell carcinoma (BCC) 11/06/2019   Chronic urticaria 09/10/2019   Type 2 diabetes mellitus (HCC) 09/10/2019   Pain 03/20/2024   Chronic painful diabetic neuropathy (HCC) 03/20/2024   Spinal stenosis 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 03/20/2024   Lumbar facet arthropathy 03/20/2024   Chronic pain syndrome 03/20/2024   Resolved Ambulatory Problems    Diagnosis Date Noted   Chest pain 03/12/2015   Bursitis 07/30/2002   Arterial blood pressure decreased 07/08/2015   Intermittent palpitations 07/08/2015   Paresthesia 04/17/2010   Past Medical History:  Diagnosis Date   Anemia    Aneurysm (HCC)    Angioedema    Arthritis    Cancer (HCC)    Constipation    Diabetes mellitus    Fatigue    Fibromuscular dysplasia (HCC)    Generalized headaches    GERD (gastroesophageal reflux disease)    Hemorrhoids    Hiatal hernia    Hypertension    Neuropathy    Pneumonia    Rectal bleeding    Rectal pain    Constitutional Exam  General appearance: Well nourished, well developed, and well hydrated. In no apparent acute distress Vitals:   03/20/24 0817  BP: (!) 131/48  Pulse: 69  Resp: 16  Temp: 97.6 F (36.4 C)  TempSrc: Temporal  SpO2: 100%  Weight: 246 lb (111.6 kg)  Height: 5\' 8"  (1.727 m)   BMI Assessment: Estimated body mass index is 37.4 kg/m as calculated from the  following:   Height as of this encounter: 5\' 8"  (1.727 m).   Weight as of this encounter: 246 lb (111.6 kg).  BMI interpretation table: BMI level Category Range association with higher incidence of chronic pain  <18 kg/m2 Underweight   18.5-24.9 kg/m2 Ideal body weight   25-29.9 kg/m2 Overweight Increased incidence by 20%  30-34.9  kg/m2 Obese (Class I) Increased incidence by 68%  35-39.9 kg/m2 Severe obesity (Class II) Increased incidence by 136%  >40 kg/m2 Extreme obesity (Class III) Increased incidence by 254%   Patient's current BMI Ideal Body weight  Body mass index is 37.4 kg/m. Ideal body weight: 63.9 kg (140 lb 14 oz) Adjusted ideal body weight: 83 kg (182 lb 14.8 oz)   BMI Readings from Last 4 Encounters:  03/20/24 37.40 kg/m  02/16/24 37.86 kg/m  07/29/23 39.23 kg/m  12/23/22 36.04 kg/m   Wt Readings from Last 4 Encounters:  03/20/24 246 lb (111.6 kg)  02/16/24 249 lb (112.9 kg)  07/29/23 258 lb (117 kg)  12/23/22 237 lb (107.5 kg)    Psych/Mental status: Alert, oriented x 3 (person, place, & time)       Eyes: PERLA Respiratory: No evidence of acute respiratory distress  Thoracic Spine Area Exam  Skin & Axial Inspection: No masses, redness, or swelling Alignment: Asymmetric Functional ROM: Unrestricted ROM Stability: No instability detected Muscle Tone/Strength: Functionally intact. No obvious neuro-muscular anomalies detected. Sensory (Neurological): Unimpaired Muscle strength & Tone: No palpable anomalies Lumbar Spine Area Exam  Skin & Axial Inspection: No masses, redness, or swelling Alignment: Symmetrical Functional ROM: Pain restricted ROM affecting both sides Stability: No instability detected Muscle Tone/Strength: Functionally intact. No obvious neuro-muscular anomalies detected. Sensory (Neurological): Neurogenic pain pattern  Gait & Posture Assessment  Ambulation: Unassisted Gait: Antalgic gait (limping) Posture: WNL  Lower Extremity Exam     Side: Right lower extremity  Side: Left lower extremity  Stability: No instability observed          Stability: No instability observed          Skin & Extremity Inspection: Skin color, temperature, and hair growth are WNL. No peripheral edema or cyanosis. No masses, redness, swelling, asymmetry, or associated skin lesions. No contractures.  Skin & Extremity Inspection: Skin color, temperature, and hair growth are WNL. No peripheral edema or cyanosis. No masses, redness, swelling, asymmetry, or associated skin lesions. No contractures.  Functional ROM: Pain restricted ROM for hip and knee joints          Functional ROM: Pain restricted ROM for hip and knee joints          Muscle Tone/Strength: Functionally intact. No obvious neuro-muscular anomalies detected.  Muscle Tone/Strength: Functionally intact. No obvious neuro-muscular anomalies detected.  Sensory (Neurological): Neurogenic pain pattern        Sensory (Neurological): Neurogenic pain pattern        DTR: Patellar: deferred today Achilles: deferred today Plantar: deferred today  DTR: Patellar: deferred today Achilles: deferred today Plantar: deferred today  Palpation: No palpable anomalies  Palpation: No palpable anomalies    Assessment  Primary Diagnosis & Pertinent Problem List: The primary encounter diagnosis was Pain. Diagnoses of Diabetic polyneuropathy associated with type 2 diabetes mellitus (HCC), Chronic painful diabetic neuropathy (HCC), Spinal stenosis 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, Lumbar spondylosis, Lumbar facet arthropathy, and Chronic pain syndrome were also pertinent to this visit.  Visit Diagnosis (New problems to examiner): 1. Pain   2. Diabetic polyneuropathy associated with type 2 diabetes mellitus (HCC)   3. Chronic painful diabetic neuropathy (HCC)   4. Spinal stenosis 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   5. Lumbar spondylosis   6. Lumbar facet arthropathy   7. Chronic pain  syndrome    Plan of Care (Initial workup plan)  The patient presents with chronic, intractable pain involving both mechanical low back pain  and neurogenic components radiating into the lower extremities. Given the patient's history of diabetic neuropathy and lumbar facet arthropathy and limited response to conservative and interventional therapies, a spinal cord stimulator (SCS) trial is being considered. While SCS is typically more effective for neuropathic and appendicular pain, we discussed that some patients may also experience relief in axial low back and hip pain. The potential benefits and risks of spinal cord stimulation were thoroughly reviewed.  The proposed plan includes a percutaneous spinal cord stimulator trial. The patient was informed that this will involve temporary placement of epidural leads connected to an external pulse generator, which will be used over a 7-day trial period. We discussed the possibility of a mid-trial in-office visit to adjust settings and optimize programming in order to give the patient the best chance of success. The patient will receive daily support from the device representative throughout the trial.  We reviewed the benefits of SCS, which include potential substantial pain relief, reduction in the use of oral pain medications including opioids, and long-term programmable therapy that can reduce reliance on repeated injections and other pain interventions. Risks were also discussed in detail and include potential surgical complications such as infection, bleeding, CSF leak, lead migration or fracture, hardware malfunction, and the possibility of either no pain relief or worsening of symptoms. The patient was advised that spinal cord stimulation is not a guaranteed solution or a "magic bullet," but rather a potentially valuable therapy in appropriately selected cases.  As part of standard protocol, the patient will require a comprehensive psychosocial and  behavioral evaluation prior to the trial which pt has completed. We had a thorough and detailed discussion reviewing the rationale, alternatives, risks, and expected outcomes. The patient stated that all questions were answered to their satisfaction, demonstrated appropriate understanding, and expressed readiness to proceed. There were no barriers to understanding the treatment plan, and the explanation was well received. The patient is eager to move forward with the spinal cord stimulator trial once the necessary evaluation is complete.  We also discussed Qutenza neurolysis for PDN of bilateral feet.   Patent windows for [place] access   Procedure Orders         NEUROLYSIS         [facility] TRIAL      Provider-requested follow-up: Return in about 8 days (around 03/28/2024) for Qutenza (schedule at 830).  Future Appointments  Date Time Provider Department Center  03/28/2024  8:40 AM Cephus Collin, MD [facility] None   I discussed the assessment and treatment plan with the patient. The patient was provided an opportunity to ask questions and all were answered. The patient agreed with the plan and demonstrated an understanding of the instructions.  Patient advised to call back or seek an in-person evaluation if the symptoms or condition worsens.  Duration of encounter: .  Total time on encounter, as per AMA guidelines included both the face-to-face and non-face-to-face time personally spent by the physician and/or other qualified health care professional(s) on the day of the encounter (includes time in activities that require the physician or other qualified health care professional and does not include time in activities normally performed by clinical staff). Physician's time may include the following activities when performed: Preparing to see the patient (e.g., pre-charting review of records, searching for previously ordered imaging, lab work, and nerve conduction tests) Review of prior analgesic  pharmacotherapies. Reviewing PMP Interpreting ordered tests (e.g., lab work, imaging, nerve conduction tests) Performing post-procedure evaluations, including interpretation of diagnostic procedures  Obtaining and/or reviewing separately obtained history Performing a medically appropriate examination and/or evaluation Counseling and educating the patient/family/caregiver Ordering medications, tests, or procedures Referring and communicating with other health care professionals (when not separately reported) Documenting clinical information in the electronic or other health record Independently interpreting results (not separately reported) and communicating results to the patient/ family/caregiver Care coordination (not separately reported)  Note by: Cephus Collin, MD (TTS and AI technology used. I apologize for any typographical errors that were not detected and corrected.) Date: 03/20/2024; Time: 10:26 AM

## 2024-03-20 NOTE — Progress Notes (Signed)
 Safety precautions to be maintained throughout the outpatient stay will include: orient to surroundings, keep bed in low position, maintain call bell within reach at all times, provide assistance with transfer out of bed and ambulation.

## 2024-03-27 ENCOUNTER — Telehealth: Payer: Self-pay

## 2024-03-27 NOTE — Telephone Encounter (Signed)
 I have prior auth for the SCS trial, Dr. Rhesa Celeste, [employer]  valid 04/23/24 - 05/23/24. Please advise on dates thanks

## 2024-03-28 ENCOUNTER — Encounter: Payer: Self-pay | Admitting: Student in an Organized Health Care Education/Training Program

## 2024-03-28 ENCOUNTER — Ambulatory Visit
Attending: Student in an Organized Health Care Education/Training Program | Admitting: Student in an Organized Health Care Education/Training Program

## 2024-03-28 DIAGNOSIS — E1142 Type 2 diabetes mellitus with diabetic polyneuropathy: Secondary | ICD-10-CM | POA: Insufficient documentation

## 2024-03-28 DIAGNOSIS — M47816 Spondylosis without myelopathy or radiculopathy, lumbar region: Secondary | ICD-10-CM | POA: Insufficient documentation

## 2024-03-28 DIAGNOSIS — E1140 Type 2 diabetes mellitus with diabetic neuropathy, unspecified: Secondary | ICD-10-CM | POA: Insufficient documentation

## 2024-03-28 DIAGNOSIS — G894 Chronic pain syndrome: Secondary | ICD-10-CM | POA: Insufficient documentation

## 2024-03-28 MED ORDER — CAPSAICIN-CLEANSING GEL 8 % EX KIT
4.0000 | PACK | Freq: Once | CUTANEOUS | Status: AC
  Administered 2024-03-28: 4 via TOPICAL
  Filled 2024-03-28: qty 4

## 2024-03-28 NOTE — Progress Notes (Signed)
 PROVIDER NOTE: Interpretation of information contained herein should be left to medically-trained personnel. Specific patient instructions are provided elsewhere under "Patient Instructions" section of medical record. This document was created in part using STT-dictation technology, any transcriptional errors that may result from this process are unintentional.  Patient: Melody Brooks Type: Established DOB: 02-24-1956 MRN: [id number] PCP: Yehuda Helms, MD  Service: Procedure DOS: 03/28/2024 Setting: Ambulatory Location: Ambulatory outpatient facility Delivery: Face-to-face Provider: Cephus Collin, MD Specialty: Interventional Pain Management Specialty designation: 09 Location: Outpatient facility Ref. Prov.: Cephus Collin, MD       Interventional Therapy   Type: Qutenza Neurolysis #1  Laterality:  Bilateral Area treated: Feet Imaging Guidance: None Anesthesia/analgesia/anxiolysis/sedation: None required Medication (Right): Qutenza (capsaicin 8%) topical system Medication (Left): Qutenza (capsaicin 8%) topical system Date: 03/28/2024 Performed by: Cephus Collin, MD Rationale (medical necessity): procedure needed and proper for the treatment of Melody Brooks medical symptoms and needs. Indication: Painful diabetic peripheral neuralgia (DPN) (ICD-10-CM:E11.40) severe enough to impact quality of life or function. 1. Diabetic polyneuropathy associated with type 2 diabetes mellitus (HCC)   2. Chronic painful diabetic neuropathy (HCC)   3. Lumbar facet arthropathy   4. Chronic pain syndrome    NAS-11 Pain score:   Pre-procedure: 10-Worst pain ever/10   Post-procedure: 10-Worst pain ever/10     Position / Prep / Materials:  Position: Supine  Materials: Qutenza Kit (4 patches)  H&P (Pre-op Assessment):  Melody Brooks is a 68 y.o. (year old), female patient, seen today for interventional treatment. She  has a past surgical history that includes right and left eye cataract surgery (2006,  2008); Tubal ligation (1995); colonoscopy (2010); Excisional hemorrhoidectomy (03/2012); Basal cell cancer  removal; IR US  Guide Vasc Access Right (09/04/2021); IR ANGIO EXTERNAL CAROTID SEL EXT CAROTID BILAT MOD SED (09/04/2021); IR ANGIO INTRA EXTRACRAN SEL INTERNAL CAROTID BILAT MOD SED (09/04/2021); IR ANGIO VERTEBRAL SEL VERTEBRAL UNI R MOD SED (09/04/2021); IR ANGIO VERTEBRAL SEL SUBCLAVIAN INNOMINATE UNI L MOD SED (09/04/2021); and Excision/release bursa hip (Right, 12/23/2022). Melody Brooks has a current medication list which includes the following prescription(s): acetaminophen , alcohol  swabs , alcohol  swabs , amlodipine, aspirin, one touch ultra mini, dialyvite vitamin d  5000, cyanocobalamin , cyclobenzaprine, diphenhydramine, duloxetine, epipen  2-pak, esomeprazole, estrogen (conjugated)-medroxyprogesterone, famotidine, fluticasone-salmeterol, furosemide, glipizide , contour next test, iron combinations, janumet , onetouch ultrasoft, lorazepam , losartan, magnesium oxide, onetouch ultra, pioglitazone , prednisone, pregabalin , propranolol er, rosuvastatin, semaglutide, spironolactone, sucralfate, zinc gluconate, and zolpidem , and the following Facility-Administered Medications: capsaicin topical system. Her primarily concern today is the No chief complaint on file.  Initial Vital Signs:  Pulse/HCG Rate: 69  Temp: (!) 97.1 F (36.2 C) Resp: 16 BP: 125/60 SpO2: 100 %  BMI: Estimated body mass index is 37.4 kg/m as calculated from the following:   Height as of this encounter: 5\' 8"  (1.727 m).   Weight as of this encounter: 246 lb (111.6 kg).  Risk Assessment: Allergies: Reviewed. She is allergic to lisinopril.  Allergy Precautions: None required Coagulopathies: Reviewed. None identified.  Blood-thinner therapy: None at this time Active Infection(s): Reviewed. None identified. [name] is afebrile  Site Confirmation: Melody Brooks was asked to confirm the procedure and laterality before marking the  site Procedure checklist: Completed Consent: Before the procedure and under the influence of no sedative(s), amnesic(s), or anxiolytics, the patient was informed of the treatment options, risks and possible complications. To fulfill our ethical and legal obligations, as recommended by the American Medical Association's Code of Ethics, I have informed the patient of my clinical impression;  the nature and purpose of the treatment or procedure; the risks, benefits, and possible complications of the intervention; the alternatives, including doing nothing; the risk(s) and benefit(s) of the alternative treatment(s) or procedure(s); and the risk(s) and benefit(s) of doing nothing. The patient was provided information about the general risks and possible complications associated with the procedure. These may include, but are not limited to: failure to achieve desired goals, infection, bleeding, organ or nerve damage, allergic reactions, paralysis, and death. In addition, the patient was informed of those risks and complications associated to the procedure, such as failure to decrease pain; infection; bleeding; organ or nerve damage with subsequent damage to sensory, motor, and/or autonomic systems, resulting in permanent pain, numbness, and/or weakness of one or several areas of the body; allergic reactions; (i.e.: anaphylactic reaction); and/or death. Furthermore, the patient was informed of those risks and complications associated with the medications. These include, but are not limited to: allergic reactions (i.e.: anaphylactic or anaphylactoid reaction(s)); adrenal axis suppression; blood sugar elevation that in diabetics may result in ketoacidosis or comma; water retention that in patients with history of congestive heart failure may result in shortness of breath, pulmonary edema, and decompensation with resultant heart failure; weight gain; swelling or edema; medication-induced neural toxicity; particulate matter  embolism and blood vessel occlusion with resultant organ, and/or nervous system infarction; and/or aseptic necrosis of one or more joints. Finally, the patient was informed that Medicine is not an exact science; therefore, there is also the possibility of unforeseen or unpredictable risks and/or possible complications that may result in a catastrophic outcome. The patient indicated having understood very clearly. We have given the patient no guarantees and we have made no promises. Enough time was given to the patient to ask questions, all of which were answered to the patient's satisfaction. Ms. Kantner has indicated that she wanted to continue with the procedure. Attestation: I, the ordering provider, attest that I have discussed with the patient the benefits, risks, side-effects, alternatives, likelihood of achieving goals, and potential problems during recovery for the procedure that I have provided informed consent. Date  Time: 03/28/2024  8:29 AM  Pre-Procedure Preparation:  Monitoring: As per clinic protocol. Respiration, ETCO2, SpO2, BP, heart rate and rhythm monitor placed and checked for adequate function Safety Precautions: Patient was assessed for positional comfort and pressure points before starting the procedure. Time-out: I initiated and conducted the "Time-out" before starting the procedure, as per protocol. The patient was asked to participate by confirming the accuracy of the "Time Out" information. Verification of the correct person, site, and procedure were performed and confirmed by me, the nursing staff, and the patient. "Time-out" conducted as per Joint Commission's Universal Protocol (UP.01.01.01). Time: 0858 Start Time: 0858 hrs.  Description/Narrative of Procedure:          Region: Distal lower extremities Target Area: Sensory peripheral nerves affected by diabetic peripheral neuropathy Site: Feet Approach: Percutaneous  No./Series: Not applicable  Type: Percutaneous   Purpose: Therapeutic  Region: Distal lower extremities  Start Time: 0858 hrs.  Description of the Procedure: Protocol guidelines were followed. The patient was assisted into a comfortable position.  Informed consent was obtained in the patient monitored in the usual manner.  All questions were answered prior to the procedure.  They Qutenza patches were applied to the affected area and then covered with the wrap.  The Patient was kept under observation until the treatment was completed.  The patches were removed and the treated area was inspected.  Vitals:  03/28/24 0838  BP: 125/60  Pulse: 69  Resp: 16  Temp: (!) 97.1 F (36.2 C)  SpO2: 100%  Weight: 246 lb (111.6 kg)  Height: 5\' 8"  (1.727 m)     End Time:   hrs.  Type of Imaging Technique: None used Indication(s): N/A Exposure Time: No patient exposure Contrast: None used. Fluoroscopic Guidance: N/A Ultrasound Guidance: N/A Interpretation: N/A  Post-operative Assessment:  Post-procedure Vital Signs:  Pulse/HCG Rate: 69  Temp: (!) 97.1 F (36.2 C) Resp: 16 BP: 125/60 SpO2: 100 %  EBL: None  Complications: No immediate post-treatment complications observed by team, or reported by patient.  Note: The patient tolerated the entire procedure well. A repeat set of vitals were taken after the procedure and the patient was kept under observation following institutional policy, for this type of procedure. Post-procedural neurological assessment was performed, showing return to baseline, prior to discharge. The patient was provided with post-procedure discharge instructions, including a section on how to identify potential problems. Should any problems arise concerning this procedure, the patient was given instructions to immediately contact us , at any time, without hesitation. In any case, we plan to contact the patient by telephone for a follow-up status report regarding this interventional procedure.  Comments:  No additional  relevant information.  Plan of Care (POC)  Orders:  No orders of the defined types were placed in this encounter.    Medications ordered for procedure: Meds ordered this encounter  Medications   capsaicin topical system 8 % patch 4 patch   Medications administered: Jamey Mccallum "Becky" had no medications administered during this visit.  See the medical record for exact dosing, route, and time of administration.  Follow-up plan:   Return for June 30th SCS trial.       Recent Visits Date Type Provider Dept  03/20/24 Office Visit Cephus Collin, MD [facility]  Showing recent visits within past 90 days and meeting all other requirements Today's Visits Date Type Provider Dept  03/28/24 Procedure visit Cephus Collin, MD [facility]  Showing today's visits and meeting all other requirements Future Appointments Date Type Provider Dept  04/30/24 Appointment Cephus Collin, MD [facility]  Showing future appointments within next 90 days and meeting all other requirements  Disposition: Discharge home  Discharge (Date  Time): 03/28/2024;   hrs.   Primary Care Physician: Yehuda Helms, MD Location: [facility] Note by: Cephus Collin, MD (TTS technology used. I apologize for any typographical errors that were not detected and corrected.) Date: 03/28/2024; Time: 9:23 AM  Disclaimer:  Medicine is not an Visual merchandiser. The only guarantee in medicine is that nothing is guaranteed. It is important to note that the decision to proceed with this intervention was based on the information collected from the patient. The Data and conclusions were drawn from the patient's questionnaire, the interview, and the physical examination. Because the information was provided in large part by the patient, it cannot be guaranteed that it has not been purposely or unconsciously manipulated. Every effort has been made to obtain as much relevant  data as possible for this evaluation. It is important to note that the conclusions that lead to this procedure are derived in large part from the available data. Always take into account that the treatment will also be dependent on availability of resources and existing treatment guidelines, considered by other Pain Management Practitioners as being common knowledge and practice, at the time of the intervention. For  Medico-Legal purposes, it is also important to point out that variation in procedural techniques and pharmacological choices are the acceptable norm. The indications, contraindications, technique, and results of the above procedure should only be interpreted and judged by a Board-Certified Interventional Pain Specialist with extensive familiarity and expertise in the same exact procedure and technique.

## 2024-03-28 NOTE — Patient Instructions (Signed)
 Pain Management Discharge Instructions  General Discharge Instructions :  If you need to reach your doctor call: Monday-Friday 8:00 am - 4:00 pm at [phone] or toll free [phone].  After clinic hours [phone] to have operator reach doctor.  Bring all of your medication bottles to all your appointments in the pain clinic.  To cancel or reschedule your appointment with Pain Management please remember to call 24 hours in advance to avoid a fee.  Refer to the educational materials which you have been given on: General Risks, I had my Procedure. Discharge Instructions, Post Sedation.  Post Procedure Instructions:  The drugs you were given will stay in your system until tomorrow, so for the next 24 hours you should not drive, make any legal decisions or drink any alcoholic beverages.  You may eat anything you prefer, but it is better to start with liquids then soups and crackers, and gradually work up to solid foods.  Please notify your doctor immediately if you have any unusual bleeding, trouble breathing or pain that is not related to your normal pain.  Depending on the type of procedure that was done, some parts of your body may feel week and/or numb.  This usually clears up by tonight or the next day.  Walk with the use of an assistive device or accompanied by an adult for the 24 hours.  You may use ice on the affected area for the first 24 hours.  Put ice in a Ziploc bag and cover with a towel and place against area 15 minutes on 15 minutes off.  You may switch to heat after 24 hours.Spinal Cord Stimulation Trial Information A spinal cord stimulation trial is a test to see whether a spinal cord stimulator reduces your pain. A spinal cord stimulator is a small device that is inserted (implanted) in your back. The stimulator has small wires (leads) that connect it to your spinal cord. The stimulator sends electrical pulses through the leads to the spinal cord. This can relieve  pain. Settings for the stimulator can be adjusted with a remote device to find the best pain control. Your health care provider may suggest a spinal cord stimulation trial if other treatments for long-lasting (chronic) pain have not worked for you. Spinal cord stimulation may be used to manage pain that is caused by: Failed back surgery. Heart pain (angina) or a blood vessel problem (peripheral vascular disease). Pain after amputation (phantom limb sensation). Nerve-related pain. Complex regional pain syndrome. Painful inflammation of a thin membrane that covers the brain and spinal cord (arachnoiditis). Other syndromes that involve chronic pain or injuries related to the spinal cord. For the trial, the stimulator is attached to your back instead of inserted under the skin. A trial period is usually 3-5 days, but this can vary among health care providers. After your trial period, you and your health care provider will discuss whether a permanent spinal cord stimulator is an option for you. The permanent stimulator may be an option depending on: Whether the stimulator reduces your pain during the trial. Whether the stimulator fits into your lifestyle. Whether the cost of the stimulator is covered by your insurance. What are the risks? Generally, a spinal cord stimulation trial is safe. However, problems can occur, including: Bleeding or pain at the insertion site of the leads. Infection at the insertion site or around the leads. Allergic reactions to medicines, devices, or dyes. Damage to the skin, nerves, back muscles, or spinal cord where the leads are placed.  Inability to move the legs (paralysis). Numbness in the legs. Inability to control when you urinate or have a bowel movement (incontinence). Spinal fluid leakage. How is a spinal cord stimulator placed for a trial?  For a trial period, the stimulator generator and battery will be outside the body, typically on a belt that you will  wear around your waist. Only the leads that connect the stimulator to the spinal cord are implanted under your skin. The exact location of the stimulator depends on where you have pain. There are two types of surgery for implanting the leads: Noninvasive surgery. In this type of surgery, a small incision is made and needles are used to place the leads under your skin. Open surgery. In this type of surgery, a larger incision is made, and the leads are implanted directly into your back. How to care for yourself during the trial period Incision care Check your incisions every day for signs of infection. Check for: Redness, swelling, or pain. Fluid or blood. Warmth. Pus or a bad smell. Activity Return to your normal activities as told by your health care provider. Ask your health care provider what activities are safe for you. You may have to avoid lifting. Ask your health care provider how much you can safely lift. General instructions Follow your health care provider's instructions about how to take care of your spinal cord stimulator and your incision. Make sure you write down the following information so that you can share this information with your health care provider: Your responses to the stimulator. Describe these as told by your health care provider. Your pain level throughout the day. The amount and kind of pain medicine that you take. Do not take baths, swim, or use a hot tub until your health care provider approves. Ask your health care provider if you may take showers. You may only be allowed to take sponge baths. Take over-the-counter and prescription medicines only as told by your health care provider. Tell all health care providers who provide care for you that you have a spinal cord stimulator. This is important information that could affect the medical treatment that you receive. Keep all follow-up visits. This is important. Contact a health care provider if: You have any of  these signs of infection: Redness, swelling, or pain around your incision. Fluid or blood coming from your incision. Warmth coming from your incision. Pus or a bad smell coming from your incision. A fever. Get help right away if: Your pain gets worse. The stimulator leads come out. You develop numbness or weakness in your legs, or you have difficulty walking. You have problems urinating or having a bowel movement. You have symptoms that last for more than 2-3 days or your symptoms suddenly get worse. These symptoms may be an emergency. Get help right away. Call 911. Do not wait to see if the symptoms will go away. Do not drive yourself to the hospital. Summary A spinal cord stimulator is a small device that sends electrical pulses to your spinal cord. This can relieve pain caused by many different health conditions. Before a permanent stimulator is placed, you will have a trial using a temporary stimulator that is not implanted under your skin. This helps determine if a stimulator will reduce your pain. For the trial, only the leads that connect the stimulator to the spinal cord are implanted under your skin. During the trial period, make sure you write down information about your pain and your responses to the stimulator  so that you can share this information with your health care provider. Keep all follow-up visits. This is important. Contact your health care provider if you have problems during the trial. This information is not intended to replace advice given to you by your health care provider. Make sure you discuss any questions you have with your health care provider. Document Revised: 07/21/2021 Document Reviewed: 07/21/2021 Elsevier Patient Education  2024 [employer].

## 2024-03-28 NOTE — Progress Notes (Signed)
 Safety precautions to be maintained throughout the outpatient stay will include: orient to surroundings, keep bed in low position, maintain call bell within reach at all times, provide assistance with transfer out of bed and ambulation.

## 2024-03-29 ENCOUNTER — Telehealth: Payer: Self-pay

## 2024-03-29 NOTE — Telephone Encounter (Signed)
 Post procedure follow up.  LM

## 2024-04-30 ENCOUNTER — Ambulatory Visit
Admission: RE | Admit: 2024-04-30 | Discharge: 2024-04-30 | Disposition: A | Discharge status: Home / Self Care | Source: Ambulatory Visit | Attending: Student in an Organized Health Care Education/Training Program | Admitting: Student in an Organized Health Care Education/Training Program

## 2024-04-30 ENCOUNTER — Ambulatory Visit: Admitting: Student in an Organized Health Care Education/Training Program

## 2024-04-30 ENCOUNTER — Encounter: Payer: Self-pay | Admitting: Student in an Organized Health Care Education/Training Program

## 2024-04-30 DIAGNOSIS — M47816 Spondylosis without myelopathy or radiculopathy, lumbar region: Secondary | ICD-10-CM | POA: Insufficient documentation

## 2024-04-30 DIAGNOSIS — E1140 Type 2 diabetes mellitus with diabetic neuropathy, unspecified: Secondary | ICD-10-CM | POA: Diagnosis present

## 2024-04-30 DIAGNOSIS — E1142 Type 2 diabetes mellitus with diabetic polyneuropathy: Secondary | ICD-10-CM | POA: Insufficient documentation

## 2024-04-30 DIAGNOSIS — G894 Chronic pain syndrome: Secondary | ICD-10-CM | POA: Insufficient documentation

## 2024-04-30 MED ORDER — LACTATED RINGERS IV SOLN: Freq: Once | INTRAVENOUS | Status: AC

## 2024-04-30 MED ORDER — LIDOCAINE HCL 2 % IJ SOLN
INTRAMUSCULAR | Status: AC
  Filled 2024-04-30: qty 20

## 2024-04-30 MED ORDER — MIDAZOLAM HCL 5 MG/5ML IJ SOLN
0.5000 mg | Freq: Once | INTRAMUSCULAR | Status: AC
  Administered 2024-04-30: 1 mg via INTRAVENOUS

## 2024-04-30 MED ORDER — CEFAZOLIN SODIUM-DEXTROSE 2-4 GM/100ML-% IV SOLN
2.0000 g | INTRAVENOUS | Status: AC
  Administered 2024-04-30: 2 g via INTRAVENOUS
  Filled 2024-04-30: qty 100

## 2024-04-30 MED ORDER — ROPIVACAINE HCL 2 MG/ML IJ SOLN
9.0000 mL | Freq: Once | INTRAMUSCULAR | Status: AC
  Administered 2024-04-30: 9 mL via PERINEURAL

## 2024-04-30 MED ORDER — SODIUM CHLORIDE (PF) 0.9 % IJ SOLN
INTRAMUSCULAR | Status: AC
Start: 2024-04-30 — End: 2024-04-30
  Filled 2024-04-30: qty 10

## 2024-04-30 MED ORDER — CEPHALEXIN 500 MG PO CAPS: 500.0000 mg | ORAL_CAPSULE | Freq: Four times a day (QID) | ORAL | 0 refills | Status: AC

## 2024-04-30 MED ORDER — FENTANYL CITRATE (PF) 100 MCG/2ML IJ SOLN
INTRAMUSCULAR | Status: AC
  Filled 2024-04-30: qty 2

## 2024-04-30 MED ORDER — CEFAZOLIN SODIUM 1 G IJ SOLR
INTRAMUSCULAR | Status: AC
  Filled 2024-04-30: qty 20

## 2024-04-30 MED ORDER — ROPIVACAINE HCL 2 MG/ML IJ SOLN
INTRAMUSCULAR | Status: AC
  Filled 2024-04-30: qty 20

## 2024-04-30 MED ORDER — LIDOCAINE HCL 2 % IJ SOLN
20.0000 mL | Freq: Once | INTRAMUSCULAR | Status: AC
  Administered 2024-04-30: 400 mg

## 2024-04-30 MED ORDER — MIDAZOLAM HCL 5 MG/5ML IJ SOLN
INTRAMUSCULAR | Status: AC
  Filled 2024-04-30: qty 5

## 2024-04-30 MED ORDER — FENTANYL CITRATE (PF) 100 MCG/2ML IJ SOLN
25.0000 ug | INTRAMUSCULAR | Status: DC | PRN
  Administered 2024-04-30: 50 ug via INTRAVENOUS

## 2024-04-30 NOTE — Progress Notes (Signed)
 Safety precautions to be maintained throughout the outpatient stay will include: orient to surroundings, keep bed in low position, maintain call bell within reach at all times, provide assistance with transfer out of bed and ambulation.

## 2024-04-30 NOTE — Progress Notes (Signed)
 PROVIDER NOTE: Interpretation of information contained herein should be left to medically-trained personnel. Specific patient instructions are provided elsewhere under Patient Instructions section of medical record. This document was created in part using STT-dictation technology, any transcriptional errors that may result from this process are unintentional.  Patient: Melody Brooks Type: Established DOB: [birth year] MRN: [id number] PCP: Auston Reyes BIRCH, MD  Service: Procedure DOS: 04/30/2024 Setting: Ambulatory Location: Ambulatory outpatient facility Delivery: Face-to-face Provider: Wallie Sherry, MD Specialty: Interventional Pain Management Specialty designation: 09 Location: Outpatient facility Ref. Prov.: Sherry Wallie, MD       Interventional Therapy   Primary Reason for Admission: Surgical management of chronic pain condition.   Procedure:              Type: Medtronic Trial Spinal Cord Neurostimulator Implant (Percutaneous, interlaminar, posterior epidural placement) Laterality: Bilateral (-50)  Level: Lumbar  Imaging: Fluoroscopic guidance Anesthesia: Local anesthesia (1-2% Lidocaine ) Sedation: Moderate Sedation                       DOS: 04/30/2024  Performed by: Wallie Sherry, MD  Purpose: Diagnostic. To determine if a permanent implant may be effective in controlling some or all of Melody Brooks chronic pain symptoms.  Rationale (medical necessity): procedure needed and proper for the diagnosis and/or treatment of Melody Brooks medical symptoms and needs. 1. Diabetic polyneuropathy associated with type 2 diabetes mellitus (HCC)   2. Chronic painful diabetic neuropathy (HCC)   3. Lumbar spondylosis   4. Lumbar facet arthropathy   5. Chronic pain syndrome    NAS-11 Pain score:   Pre-procedure: 8 /10   Post-procedure: 0-No pain/10     Target: Posterior epidural space over the dorsal columns of the spinal cord. Location: Posterior intraspinal canal Region: Thoracolumbar   Approach: Translaminar percutaneous  Type of procedure: Surgical   Position / Prep / Materials:  Position: Prone  Prep solution: ChloraPrep (2% chlorhexidine  gluconate and 70% isopropyl alcohol ) Materials:  Tray: Implant tray Needle(s):  Type: Epidural  Gauge (G): 14  Length: Regular (10cm)  Qty: 2  H&P (Pre-op Assessment):  Melody Brooks is a 68 y.o. (year old), female patient, seen today for interventional treatment. She  has a past surgical history that includes right and left eye cataract surgery (2006, 2008); Tubal ligation (1995); colonoscopy (2010); Excisional hemorrhoidectomy (03/2012); Basal cell cancer  removal; IR US  Guide Vasc Access Right (09/04/2021); IR ANGIO EXTERNAL CAROTID SEL EXT CAROTID BILAT MOD SED (09/04/2021); IR ANGIO INTRA EXTRACRAN SEL INTERNAL CAROTID BILAT MOD SED (09/04/2021); IR ANGIO VERTEBRAL SEL VERTEBRAL UNI R MOD SED (09/04/2021); IR ANGIO VERTEBRAL SEL SUBCLAVIAN INNOMINATE UNI L MOD SED (09/04/2021); and Excision/release bursa hip (Right, 12/23/2022).  Initial Vital Signs:  Pulse/EKG Rate: 63ECG Heart Rate: 63 Temp: (!) 97 F (36.1 C) Resp: 16 BP: (!) 119/56 SpO2: 100 %  BMI: Estimated body mass index is 36.95 kg/m as calculated from the following:   Height as of this encounter: 5' 8 (1.727 m).   Weight as of this encounter: 243 lb (110.2 kg).  Risk Assessment: Allergies: Reviewed. She is allergic to lisinopril.  Allergy Precautions: None required Coagulopathies: Reviewed. None identified.  Blood-thinner therapy: None at this time Active Infection(s): Reviewed. None identified. Melody Brooks is afebrile  Site Confirmation: Melody Brooks was asked to confirm the procedure and laterality before marking the site, which she did. Procedure checklist: Completed Consent: Before the procedure and under the influence of no sedative(s), amnesic(s), or anxiolytics, the patient was informed  of the treatment options, risks and possible complications. To fulfill our  ethical and legal obligations, as recommended by the American Medical Association's Code of Ethics, I have informed the patient of my clinical impression; the nature and purpose of the treatment or procedure; the risks, benefits, and possible complications of the intervention; the alternatives, including doing nothing; the risk(s) and benefit(s) of the alternative treatment(s) or procedure(s); and the risk(s) and benefit(s) of doing nothing.  Melody Brooks was provided with information about the general risks and possible complications associated with most interventional procedures. These include, but are not limited to: failure to achieve desired goals, infection, bleeding, organ or nerve damage, allergic reactions, paralysis, and/or death.  In addition, she was informed of those risks and possible complications associated to this particular procedure, which include, but are not limited to: damage to the implant; failure to decrease pain; local, systemic, or serious CNS infections, intraspinal abscess with possible cord compression and paralysis, or life-threatening such as meningitis; intrathecal and/or epidural bleeding with formation of hematoma with possible spinal cord compression and permanent paralysis; organ damage; nerve injury or damage with subsequent sensory, motor, and/or autonomic system dysfunction, resulting in transient or permanent pain, numbness, and/or weakness of one or several areas of the body; allergic reactions, either minor or major life-threatening, such as anaphylactic or anaphylactoid reactions.  Furthermore, Melody Brooks was informed of those risks and complications associated with the medications. These include, but are not limited to: allergic reactions (i.e.: anaphylactic or anaphylactoid reactions); arrhythmia;  Hypotension/hypertension; cardiovascular collapse; respiratory depression and/or shortness of breath; swelling or edema; medication-induced neural toxicity; particulate  matter embolism and blood vessel occlusion with resultant organ, and/or nervous system infarction and permanent paralysis.  Finally, she was informed that Medicine is not an exact science; therefore, there is also the possibility of unforeseen or unpredictable risks and/or possible complications that may result in a catastrophic outcome. The patient indicated having understood very clearly. We have given the patient no guarantees and we have made no promises. Enough time was given to the patient to ask questions, all of which were answered to the patient's satisfaction. Ms. Fernando has indicated that she wanted to continue with the procedure. Attestation: I, the ordering provider, attest that I have discussed with the patient the benefits, risks, side-effects, alternatives, likelihood of achieving goals, and potential problems during recovery for the procedure that I have provided informed consent. Date  Time: 04/30/2024  7:45 AM  Pre-Procedure Preparation:  Monitoring: As per clinic protocol. Respiration, ETCO2, SpO2, BP, heart rate and rhythm monitor placed and checked for adequate function Safety Precautions: Patient was assessed for positional comfort and pressure points before starting the procedure. Time-out: I initiated and conducted the Time-out before starting the procedure, as per protocol. The patient was asked to participate by confirming the accuracy of the Time Out information. Verification of the correct person, site, and procedure were performed and confirmed by me, the nursing staff, and the patient. Time-out conducted as per Joint Commission's Universal Protocol (UP.01.01.01). Time: 0843 Start Time: 0845 hrs.  Description/Narrative of Procedure:          Rationale (medical necessity): procedure needed and proper for the diagnosis and/or treatment of the patient's medical symptoms and needs. Procedural Technique Safety Precautions: Aspiration looking for blood return was conducted  prior to all injections. At no point did we inject any substances, as a needle was being advanced. No attempts were made at seeking any paresthesias. Safe injection practices and needle disposal techniques  used. Medications properly checked for expiration dates. SDV (single dose vial) medications used. Description of the Procedure: Protocol guidelines were followed. The patient was assisted into a comfortable position. The target area was identified and the area prepped in the usual manner. Skin & deeper tissues infiltrated with local anesthetic. Appropriate amount of time allowed to pass for local anesthetics to take effect. The procedure needles were then advanced to the target area. Proper needle placement secured. Negative aspiration confirmed. Solution injected in intermittent fashion, asking for systemic symptoms every 0.5cc of injectate. The needles were then removed and the area cleansed, making sure to leave some of the prepping solution back to take advantage of its long term bactericidal properties.  Technical description of procedure: Availability of a responsible, adult driver, and NPO status confirmed. Informed consent was obtained after having discussed risks and possible complications. An IV was started. The patient was then taken to the fluoroscopy suite, where the patient was placed in position for the procedure, over the fluoroscopy table. The patient was then monitored in the usual manner. Fluoroscopy was manipulated to obtain the best possible view of the target. Parallex error was corrected before commencing the procedure. Once a clear view of the target had been obtained, the skin and deeper tissues over the procedure site were infiltrated using lidocaine , loaded in a 10 cc luer-loc syringe with a 0.5 inch, 25-G needle. The introducer needle(s) was/were then inserted through the skin and deeper tissues. A paramidline approach was used to enter the posterior epidural space at a 30 angle,  using "Loss-of-resistance Technique" with 3 ml of PF-NaCl (0.9% NSS). Correct needle placement was confirmed in the antero-posterior and lateral fluoroscopic views. The lead was gently introduced and manipulated under real-time fluoroscopy, constantly assessing for pain, discomfort, or paresthesias, until the tip rested at the desired level. Both sides were done in identical fashion. Electrode placement was tested until appropriate coverage was attained. Once the patient confirmed that the stimulation was over the desired area, the lead(s) was/were secured in place and the introducer needles removed. This was done under real-time fluoroscopy while observing the electrode tip to avoid unintended migration. The area was covered with a non-occlusive dressing and the patient transported to recovery for further programming.  Vitals:   04/30/24 0920 04/30/24 0935 04/30/24 0945 04/30/24 0955  BP: 128/81 106/76 (!) 114/52 (!) 133/54  Pulse:      Resp: 17 18 16 20   Temp:      TempSrc:      SpO2: 99% 99% 100% 100%  Weight:      Height:        Start Time: 0845 hrs. End Time: 0922 (9077 taping patient) hrs.  Neurostimulator Details:  Lead(s):  Brand: Medtronic         Epidural Access Level:  T12-L1 T12-L1  Lead implant:  Bilateral   No. of Electrodes/Lead:  8 8  Laterality:  Left Right  Top electrode location:  T8 T9  Model No.: [id number] Same  Length: 60 cm Same  Lot No.: [id number]   Imaging Guidance (Spinal):         Type of Imaging Technique: Fluoroscopy Guidance (Spinal) Indication(s): Fluoroscopy guidance for needle placement to enhance accuracy in procedures requiring precise needle localization for targeted delivery of medication in or near specific anatomical locations not easily accessible without such real-time imaging assistance. Exposure Time: Please see nurses notes. Contrast: None used. Fluoroscopic Guidance: I was personally present during the use of fluoroscopy.  Tunnel Vision  Technique used to obtain the best possible view of the target area. Parallax error corrected before commencing the procedure. Direction-depth-direction technique used to introduce the needle under continuous pulsed fluoroscopy. Once target was reached, antero-posterior, oblique, and lateral fluoroscopic projection used confirm needle placement in all planes. Images permanently stored in EMR. Interpretation: No contrast injected. I personally interpreted the imaging intraoperatively. Adequate needle placement confirmed in multiple planes. Permanent images saved into the patient's record.      Antibiotic Prophylaxis:   2g ANCEF  Pre op Anti-infectives (From admission, onward)    Start     Dose/Rate Route Frequency Ordered Stop   04/30/24 0835  ceFAZolin  (ANCEF ) IVPB 2g/100 mL premix        2 g 200 mL/hr over 30 Minutes Intravenous 30 min pre-op 04/30/24 0835 04/30/24 0905   04/30/24 0000  cephALEXin  (KEFLEX ) 500 MG capsule        500 mg Oral 4 times daily 04/30/24 0820 05/07/24 2359      Indication(s): Implant Prophylaxis.  Post-operative Assessment:  Post-procedure Vital Signs:  Pulse/HCG Rate: 6266 Temp: (!) 97 F (36.1 C) Resp: 20 BP: (!) 133/54 SpO2: 100 %  Complications: No immediate post-treatment complications observed by team, or reported by patient.  Note: The patient tolerated the entire procedure well. A repeat set of vitals were taken after the procedure and the patient was kept under observation following institutional policy, for this type of procedure. Post-procedural neurological assessment was performed, showing return to baseline, prior to discharge. The patient was provided with post-procedure discharge instructions, including a section on how to identify potential problems. Should any problems arise concerning this procedure, the patient was given instructions to immediately contact us , at any time, without hesitation. In any case, we plan to  contact the patient by telephone for a follow-up status report regarding this interventional procedure.  Comments:  No additional relevant information.  Plan of Care Orders:  Orders Placed This Encounter  Procedures   DG PAIN CLINIC C-ARM 1-60 MIN NO REPORT    Intraoperative interpretation by procedural physician at [facility].    Standing Status:   Standing    Number of Occurrences:   1    Reason for exam::   Assistance in needle guidance and placement for procedures requiring needle placement in or near specific anatomical locations not easily accessible without such assistance.     Medications administered: We administered lidocaine , lactated ringers , midazolam , fentaNYL , ropivacaine (PF) 2 mg/mL (0.2%), and ceFAZolin .  See the medical record for exact dosing, route, and time of administration.   Follow-up plan:   Return in about 1 week (around 05/07/2024) for SCS lead pull.     Recent Visits Date Type Provider Dept  03/28/24 Procedure visit Marcelino Nurse, MD [facility]  03/20/24 Office Visit Marcelino Nurse, MD [facility]  Showing recent visits within past 90 days and meeting all other requirements Today's Visits Date Type Provider Dept  04/30/24 Procedure visit Marcelino Nurse, MD [facility]  Showing today's visits and meeting all other requirements Future Appointments Date Type Provider Dept  05/07/24 Appointment Marcelino Nurse, MD [facility]  Showing future appointments within next 90 days and meeting all other requirements  Disposition: Discharge home  Discharge (Date  Time): 04/30/2024; 1000 hrs.   Primary Care Physician: Auston Reyes BIRCH, MD Location: [facility] Note by: Nurse Marcelino, MD (TTS technology used. I apologize for any typographical errors that were not detected and corrected.) Date:  04/30/2024; Time: 10:35 AM

## 2024-04-30 NOTE — Patient Instructions (Addendum)
 Today we did the following -We have done a Spinal Cord Stimulator Trial with [employer]  -As long as the leads are in place, do not bathe or shower. You may sponge bathe.  -While the lead is in place, please limit the bending, lifting, or twisting because the lead can move.  -The things we want to see is if your pain improves (and by what percentage), if you can do more activity (don't overdo it), and if you can use less of your as needed medicine. Do not stop long acting medicines like methadone, oxycontin , MS Contin, etc without checking with us .  -It is VERY important that you pick up the antibiotics we prescribed, Keflex , on your way home from the trial and take them as prescribed(4 times a day), starting today, for as long as the lead is in place.  -The Spina Cord Stimulator Representative will be in contact with you while the lead is in place to make sure the trial goes as well as possible.  -Please contact us  with any questions or concerns at any time during the trial.   -If you start running a fever over 100 degrees, have severe back pain, or new pain running down the legs, or drainage coming from the lead site, contact us  immediately and/or go to the emergency room.  -Please do not restart any sort of medication that can thin your blood such as Aspirin, ibuprofen, motrin, aleve, plavix, coumadin, etc. If you aren't sure, call and ask.  -We will have you return next Monday to have the lead removed. If this is successful, at that point we can go over the details about the permanent implant.   Post-Procedure Discharge Instructions  Instructions: Apply ice:  Purpose: This will minimize any swelling and discomfort after procedure.  When: Day of procedure, as soon as you get home. How: Fill a plastic sandwich bag with crushed ice. Cover it with a small towel and apply to injection site. How long: (15 min on, 15 min off) Apply for 15 minutes then remove x 15 minutes.  Repeat sequence  on day of procedure, until you go to bed. Apply heat:  Purpose: To treat any soreness and discomfort from the procedure. When: Starting the next day after the procedure. How: Apply heat to procedure site starting the day following the procedure. How long: May continue to repeat daily, until discomfort goes away. Food intake: Start with clear liquids (like water) and advance to regular food, as tolerated.  Physical activities: Keep activities to a minimum for the first 8 hours after the procedure. After that, then as tolerated. Driving: If you have received any sedation, be responsible and do not drive. You are not allowed to drive for 24 hours after having sedation. Blood thinner: (Applies only to those taking blood thinners) You may restart your blood thinner 6 hours after your procedure. Insulin: (Applies only to Diabetic patients taking insulin) As soon as you can eat, you may resume your normal dosing schedule. Infection prevention: Keep procedure site clean and dry. Shower daily and clean area with soap and water. Post-procedure Pain Diary: Extremely important that this be done correctly and accurately. Recorded information will be used to determine the next step in treatment. For the purpose of accuracy, follow these rules: Evaluate only the area treated. Do not report or include pain from an untreated area. For the purpose of this evaluation, ignore all other areas of pain, except for the treated area. After your procedure, avoid taking a long  nap and attempting to complete the pain diary after you wake up. Instead, set your alarm clock to go off every hour, on the hour, for the initial 8 hours after the procedure. Document the duration of the numbing medicine, and the relief you are getting from it. Do not go to sleep and attempt to complete it later. It will not be accurate. If you received sedation, it is likely that you were given a medication that may cause amnesia. Because of this,  completing the diary at a later time may cause the information to be inaccurate. This information is needed to plan your care. Follow-up appointment: Keep your post-procedure follow-up evaluation appointment after the procedure (usually 2 weeks for most procedures, 6 weeks for radiofrequencies). DO NOT FORGET to bring you pain diary with you.   Expect: (What should I expect to see with my procedure?) From numbing medicine (AKA: Local Anesthetics): Numbness or decrease in pain. You may also experience some weakness, which if present, could last for the duration of the local anesthetic. Onset: Full effect within 15 minutes of injected. Duration: It will depend on the type of local anesthetic used. On the average, 1 to 8 hours.  From steroids (Applies only if steroids were used): Decrease in swelling or inflammation. Once inflammation is improved, relief of the pain will follow. Onset of benefits: Depends on the amount of swelling present. The more swelling, the longer it will take for the benefits to be seen. In some cases, up to 10 days. Duration: Steroids will stay in the system x 2 weeks. Duration of benefits will depend on multiple posibilities including persistent irritating factors. Side-effects: If present, they may typically last 2 weeks (the duration of the steroids). Frequent: Cramps (if they occur, drink Gatorade and take over-the-counter Magnesium 450-500 mg once to twice a day); water retention with temporary weight gain; increases in blood sugar; decreased immune system response; increased appetite. Occasional: Facial flushing (red, warm cheeks); mood swings; menstrual changes. Uncommon: Long-term decrease or suppression of natural hormones; bone thinning. (These are more common with higher doses or more frequent use. This is why we prefer that our patients avoid having any injection therapies in other practices.)  Very Rare: Severe mood changes; psychosis; aseptic necrosis. From  procedure: Some discomfort is to be expected once the numbing medicine wears off. This should be minimal if ice and heat are applied as instructed.  Call if: (When should I call?) You experience numbness and weakness that gets worse with time, as opposed to wearing off. New onset bowel or bladder incontinence. (Applies only to procedures done in the spine)  Emergency Numbers: Durning business hours (Monday - Thursday, 8:00 AM - 4:00 PM) (Friday, 9:00 AM - 12:00 Noon): [phone] After hours: [phone] NOTE: If you are having a problem and are unable connect with, or to talk to a provider, then go to your nearest urgent care or emergency department. If the problem is serious and urgent, please call 911.   Moderate Conscious Sedation, Adult, Care After After the procedure, it is common to have: Sleepiness for a few hours. Impaired judgment for a few hours. Trouble with balance. Nausea or vomiting if you eat too soon. Follow these instructions at home: For the time period you were told by your health care provider:  Rest. Do not participate in activities where you could fall or become injured. Do not drive or use machinery. Do not drink alcohol . Do not take sleeping pills or medicines that  cause drowsiness. Do not make important decisions or sign legal documents. Do not take care of children on your own. Eating and drinking Follow instructions from your health care provider about what you may eat and drink. Drink enough fluid to keep your urine pale yellow. If you vomit: Drink clear fluids slowly and in small amounts as you are able. Clear fluids include water, ice chips, low-calorie sports drinks, and fruit juice that has water added to it (diluted fruit juice). Eat light and bland foods in small amounts as you are able. These foods include bananas, applesauce, rice, lean meats, toast, and crackers. General instructions Take over-the-counter and prescription medicines only  as told by your health care provider. Have a responsible adult stay with you for the time you are told. Do not use any products that contain nicotine or tobacco. These products include cigarettes, chewing tobacco, and vaping devices, such as e-cigarettes. If you need help quitting, ask your health care provider. Return to your normal activities as told by your health care provider. Ask your health care provider what activities are safe for you. Your health care provider may give you more instructions. Make sure you know what you can and cannot do. Contact a health care provider if: You are still sleepy or having trouble with balance after 24 hours. You feel light-headed. You vomit every time you eat or drink. You get a rash. You have a fever. You have redness or swelling around the IV site. Get help right away if: You have trouble breathing. You start to feel confused at home. These symptoms may be an emergency. Get help right away. Call 911. Do not wait to see if the symptoms will go away. Do not drive yourself to the hospital. This information is not intended to replace advice given to you by your health care provider. Make sure you discuss any questions you have with your health care provider. Document Revised: 05/03/2022 Document Reviewed: 05/03/2022 Elsevier Patient Education  2024 [employer].

## 2024-05-01 ENCOUNTER — Telehealth: Payer: Self-pay

## 2024-05-01 NOTE — Telephone Encounter (Signed)
 Post procedure follow up.  Patient states she is doing fine and only slight amt of blood on dressing.

## 2024-05-07 ENCOUNTER — Ambulatory Visit
Admission: RE | Admit: 2024-05-07 | Discharge: 2024-05-07 | Disposition: A | Discharge status: Home / Self Care | Source: Ambulatory Visit | Attending: Student in an Organized Health Care Education/Training Program | Admitting: Student in an Organized Health Care Education/Training Program

## 2024-05-07 ENCOUNTER — Ambulatory Visit (HOSPITAL_BASED_OUTPATIENT_CLINIC_OR_DEPARTMENT_OTHER): Admitting: Student in an Organized Health Care Education/Training Program

## 2024-05-07 ENCOUNTER — Encounter: Payer: Self-pay | Admitting: Student in an Organized Health Care Education/Training Program

## 2024-05-07 VITALS — BP 130/53 | HR 68 | Temp 97.0°F | Resp 16 | Ht 68.0 in | Wt 240.0 lb

## 2024-05-07 DIAGNOSIS — E1142 Type 2 diabetes mellitus with diabetic polyneuropathy: Secondary | ICD-10-CM

## 2024-05-07 DIAGNOSIS — E1140 Type 2 diabetes mellitus with diabetic neuropathy, unspecified: Secondary | ICD-10-CM

## 2024-05-07 DIAGNOSIS — M47816 Spondylosis without myelopathy or radiculopathy, lumbar region: Secondary | ICD-10-CM

## 2024-05-07 NOTE — Progress Notes (Signed)
 PROVIDER NOTE: Interpretation of information contained herein should be left to medically-trained personnel. Specific patient instructions are provided elsewhere under Patient Instructions section of medical record. This document was created in part using AI and STT-dictation technology, any transcriptional errors that may result from this process are unintentional.  Patient: Melody Brooks  Service: E/M Post-op Encounter  PCP: Auston Reyes BIRCH, MD  DOB: [birth year]  DOS: 05/07/2024  Provider: Wallie Sherry, MD  MRN: [id number]  Delivery: Face-to-face  Specialty: Interventional Pain Management  Type: Established Patient  Setting: Ambulatory outpatient facility  Specialty designation: 09  Referring Prov.: Auston Reyes BIRCH, MD  Location: Outpatient office facility  SCS TRIAL POST-OP EVALUATION     Primary Reason(s) for Visit: Encounter for removal of temporary spinal cord stimulator lead(s) and evaluation of trial implant. CC: Back Pain (Lumbar bilateral )  HPI  Melody Brooks is a 68 y.o. year old, female patient, who comes today for a post-procedure evaluation. She has Hyperlipemia; PALPITATIONS; DYSPNEA; ABNORMAL EKG; Internal hemorrhoids with complication; Left arm pain; Obesity; Essential hypertension; Angio-edema; Diabetes mellitus with polyneuropathy (HCC); Accumulation of fluid in tissues; Arterial fibromuscular dysplasia (HCC); Acid reflux; Idiopathic scoliosis; Insomnia; Lumbosacral radiculitis; Anemia, iron deficiency; Idiopathic peripheral neuropathy; Inflammation with thrombosis; Bursitis, trochanteric; B12 deficiency; Avitaminosis D; Carotid stenosis; Morbid obesity with BMI of 40.0-44.9, adult (HCC); Dermatochalasis of eyelid; Ptosis, both eyelids; Visual field defect; Basal cell carcinoma (BCC); Chronic urticaria; Type 2 diabetes mellitus (HCC); Pain; Chronic painful diabetic neuropathy (HCC); Spinal stenosis 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; Lumbar facet arthropathy; and Chronic  pain syndrome on their problem list. Her primarily concern today is the Back Pain (Lumbar bilateral )  Pain Assessment: Location: Lower, Left, Right Back Radiating: denies Onset: More than a month ago Duration: Chronic pain Quality: Other (Comment) (no pain with SCS in place and on) Severity: 0-No pain/10 (subjective, self-reported pain score)  Effect on ADL: with stimulator she has been able to endure more activity around the house Timing: Intermittent Modifying factors: stimulator BP: (!) 130/53  HR: 68  Melody Brooks comes in today, after a SCS (Spinal Cord Stimulator) Trial Implant on 05/01/2024, to have her percutaneous, temporary neurostimulator lead(s) removed and to evaluate the trial experience to determine if a permanent implant may be effective in controlling some or all of her chronic pain symptoms.  Further details on both, my assessment(s), as well as the proposed treatment plan, please see below.  Post-operative Assessment Intra-procedural problems/complications: None observed.         Reported side-effects: None.        Post-surgical adverse reactions or complications: None reported         Patient reports greater than 50% pain relief during her spinal cord stimulator trial and also improvement in ADLs.  She states that she was more comfortable and doing chores and tasks around the house during her spinal cord stimulator trial and is interested in moving forward with spinal cord stimulator permanent implant with [employer].  I will refer her to Dr. Katrina with neurosurgery for that.  Laboratory Chemistry Profile   Renal Lab Results  Component Value Date   BUN 20 09/04/2021   CREATININE 1.20 (H) 09/04/2021   BCR 20 09/10/2020   GFRAA 67 09/10/2020   GFRNONAA 50 (L) 09/04/2021   SPECGRAV 1.015 05/17/2019   PHUR 7.5 05/17/2019   PROTEINUR Positive (A) 05/17/2019     Electrolytes Lab Results  Component Value Date   NA 139 09/04/2021   K 4.7 09/04/2021  CL 104  09/04/2021   CALCIUM 9.3 09/04/2021     Hepatic Lab Results  Component Value Date   AST 18 09/10/2020   ALT 17 09/10/2020   ALBUMIN 4.2 09/10/2020   ALKPHOS 78 09/10/2020     ID Lab Results  Component Value Date   HIV Non Reactive 12/02/2017     Bone Lab Results  Component Value Date   VD25OH 35.4 12/02/2017     Endocrine Lab Results  Component Value Date   GLUCOSE 139 (H) 09/04/2021   HGBA1C 5.7 (H) 09/10/2020   TSH 3.400 09/10/2020     Neuropathy Lab Results  Component Value Date   VITAMINB12 1,950 (H) 12/02/2017   HGBA1C 5.7 (H) 09/10/2020   HIV Non Reactive 12/02/2017     CNS No results found for: COLORCSF, APPEARCSF, RBCCOUNTCSF, WBCCSF, POLYSCSF, LYMPHSCSF, EOSCSF, PROTEINCSF, GLUCCSF, JCVIRUS, CSFOLI, IGGCSF, LABACHR, ACETBL   Inflammation (CRP: Acute  ESR: Chronic) No results found for: CRP, ESRSEDRATE, LATICACIDVEN   Rheumatology No results found for: RF, ANA, LABURIC, URICUR, LYMEIGGIGMAB, LYMEABIGMQN, HLAB27   Coagulation Lab Results  Component Value Date   INR 1.0 09/04/2021   LABPROT 13.1 09/04/2021   PLT 231 09/04/2021     Cardiovascular Lab Results  Component Value Date   HGB 11.3 (L) 09/04/2021   HCT 35.0 (L) 09/04/2021     Screening Lab Results  Component Value Date   HIV Non Reactive 12/02/2017     Cancer No results found for: CEA, CA125, LABCA2   Allergens No results found for: ALMOND, APPLE, ASPARAGUS, AVOCADO, BANANA, BARLEY, BASIL, BAYLEAF, GREENBEAN, LIMABEAN, WHITEBEAN, BEEFIGE, REDBEET, BLUEBERRY, BROCCOLI, CABBAGE, MELON, CARROT, CASEIN, CASHEWNUT, CAULIFLOWER, CELERY     Note: Lab results reviewed.  Recent Imaging Results  No results found for this or any previous visit.  Interpretation Report: Fluoroscopy was used during the procedure to assist with needle guidance. The images were interpreted intraoperatively by the  requesting physician.        Meds   Current Outpatient Medications:    acetaminophen  (TYLENOL ) 500 MG tablet, Take 1,000 mg by mouth every 6 (six) hours as needed for moderate pain., Disp: , Rfl:    Alcohol  Swabs  70 % PADS, 1 each by Does not apply route 2 (two) times daily., Disp: 100 each, Rfl: 12   Alcohol  Swabs  PADS, Used to check blood sugars 3-4 times a day. Dx E11.9., Disp: 360 each, Rfl: 3   amLODipine (NORVASC) 5 MG tablet, Take 5 mg by mouth daily., Disp: , Rfl:    aspirin 81 MG tablet, Take 81 mg by mouth daily., Disp: , Rfl:    Blood Glucose Monitoring Suppl (ONE TOUCH ULTRA MINI) w/Device KIT, 1 each by Does not apply route daily., Disp: 1 kit, Rfl: 0   Cholecalciferol (DIALYVITE VITAMIN D  5000) 125 MCG (5000 UT) capsule, Take 5,000 Units by mouth daily., Disp: , Rfl:    Cyanocobalamin  (B-12 IJ), Inject as directed every 14 (fourteen) days., Disp: , Rfl:    cyclobenzaprine (FLEXERIL) 10 MG tablet, Take 10 mg by mouth at bedtime., Disp: , Rfl:    diphenhydrAMINE (BENADRYL) 25 MG tablet, Take 25 mg by mouth 2 (two) times daily., Disp: , Rfl:    DULoxetine (CYMBALTA) 30 MG capsule, Take 30 mg by mouth daily., Disp: , Rfl:    EPIPEN  2-PAK 0.3 MG/0.3ML SOAJ injection, Inject 0.3 mg into the muscle as needed for anaphylaxis., Disp: , Rfl:    esomeprazole (NEXIUM) 20 MG capsule, Take 40 mg by  mouth daily at 12 noon., Disp: , Rfl:    estrogen, conjugated,-medroxyprogesterone (PREMPRO) 0.3-1.5 MG tablet, Take 1 tablet by mouth daily., Disp: , Rfl:    famotidine (PEPCID) 20 MG tablet, Take 20 mg by mouth at bedtime., Disp: , Rfl:    fluticasone-salmeterol (ADVAIR) 250-50 MCG/ACT AEPB, Inhale 1 puff into the lungs 2 (two) times daily as needed (chest congestion)., Disp: , Rfl:    furosemide (LASIX) 20 MG tablet, Take 20 mg by mouth daily., Disp: , Rfl:    glipiZIDE  (GLUCOTROL  XL) 2.5 MG 24 hr tablet, TAKE 1 TABLET BY MOUTH  DAILY WITH BREAKFAST, Disp: 90 tablet, Rfl: 2   glucose blood  (CONTOUR NEXT TEST) test strip, TEST TWICE DAILY, Disp: 200 strip, Rfl: 3   Iron Combinations (IRON COMPLEX PO), Take 20 mg by mouth daily., Disp: , Rfl:    JANUMET  50-1000 MG tablet, TAKE 1 TABLET BY MOUTH  TWICE DAILY, Disp: 180 tablet, Rfl: 2   Lancets (ONETOUCH ULTRASOFT) lancets, USE TO TEST ONCE DAILY, Disp: 100 each, Rfl: 3   LORazepam  (ATIVAN ) 2 MG tablet, Take 1 tablet (2 mg total) by mouth at bedtime., Disp: , Rfl:    losartan (COZAAR) 50 MG tablet, TAKE 1 TABLET BY MOUTH  DAILY, Disp: 90 tablet, Rfl: 2   magnesium oxide (MAG-OX) 400 (240 Mg) MG tablet, Take 400 mg by mouth daily., Disp: , Rfl:    ONETOUCH ULTRA test strip, USE TO CHECK BLOOD SUGAR  (FASTING) EVERY MORNING.  TEST UP TO 4 TIMES DAILY IF HAVING HYPOGLYCEMIA., Disp: 400 each, Rfl: 3   pioglitazone  (ACTOS ) 45 MG tablet, TAKE 1 TABLET BY MOUTH AT  BEDTIME, Disp: 60 tablet, Rfl: 5   predniSONE (DELTASONE) 10 MG tablet, Take 10 mg by mouth 3 (three) times daily as needed (hives/itching)., Disp: , Rfl:    pregabalin  (LYRICA ) 100 MG capsule, Take 1 capsule (100 mg total) by mouth 3 (three) times daily. (Patient taking differently: Take 100 mg by mouth 2 (two) times daily.), Disp: 270 capsule, Rfl: 3   propranolol ER (INDERAL LA) 60 MG 24 hr capsule, Take 60 mg by mouth daily., Disp: , Rfl:    rosuvastatin (CRESTOR) 10 MG tablet, Take 10 mg by mouth daily., Disp: , Rfl:    Semaglutide (OZEMPIC, 0.25 OR 0.5 MG/DOSE, [place]), Inject into the skin once a week. Mondays, Disp: , Rfl:    spironolactone (ALDACTONE) 25 MG tablet, Take by mouth., Disp: , Rfl:    sucralfate (CARAFATE) 1 g tablet, Take by mouth., Disp: , Rfl:    zinc gluconate 50 MG tablet, Take 50 mg by mouth daily., Disp: , Rfl:    zolpidem  (AMBIEN ) 5 MG tablet, Take 1 tablet (5 mg total) by mouth at bedtime. (for peripheral neuropathy), Disp: , Rfl:    cephALEXin  (KEFLEX ) 500 MG capsule, Take 1 capsule (500 mg total) by mouth 4 (four) times daily for 7 days. (Patient not  taking: Reported on 05/07/2024), Disp: 28 capsule, Rfl: 0  ROS  Constitutional: Denies any fever or chills Gastrointestinal: No reported hemesis, hematochezia, vomiting, or acute GI distress Musculoskeletal: Denies any acute onset joint swelling, redness, loss of ROM, or weakness Neurological: No reported episodes of acute onset apraxia, aphasia, dysarthria, agnosia, amnesia, paralysis, loss of coordination, or loss of consciousness  Allergies  Melody Brooks is allergic to lisinopril.  PFSH  Drug: Melody Brooks  reports no history of drug use. Alcohol :  reports no history of alcohol  use. Tobacco:  reports that she has  never smoked. She has never used smokeless tobacco. Medical:  has a past medical history of Anemia, Aneurysm (HCC), Angioedema, Arthritis, Cancer (HCC), Constipation, Diabetes mellitus, Fatigue, Fibromuscular dysplasia (HCC), Generalized headaches, GERD (gastroesophageal reflux disease), Hemorrhoids, Hiatal hernia, Hypertension, Neuropathy, Palpitations, Pneumonia, Rectal bleeding, and Rectal pain. Surgical: Melody Brooks  has a past surgical history that includes right and left eye cataract surgery (2006, 2008); Tubal ligation (1995); colonoscopy (2010); Excisional hemorrhoidectomy (03/2012); Basal cell cancer  removal; IR US  Guide Vasc Access Right (09/04/2021); IR ANGIO EXTERNAL CAROTID SEL EXT CAROTID BILAT MOD SED (09/04/2021); IR ANGIO INTRA EXTRACRAN SEL INTERNAL CAROTID BILAT MOD SED (09/04/2021); IR ANGIO VERTEBRAL SEL VERTEBRAL UNI R MOD SED (09/04/2021); IR ANGIO VERTEBRAL SEL SUBCLAVIAN INNOMINATE UNI L MOD SED (09/04/2021); and Excision/release bursa hip (Right, 12/23/2022). Family: family history includes Arthritis in her mother; COPD in her mother; Cancer in her father, maternal aunt, and paternal grandmother; Colon cancer in her father; Diabetes in her brother and brother; Heart failure in her mother; Hypertension in her mother; Parkinson's disease in her father.  Postop Exam  General  appearance: Afebrile. Well nourished, well developed, and well hydrated. In no apparent acute distress. Vitals:   05/07/24 0808  BP: (!) 130/53  Pulse: 68  Resp: 16  Temp: (!) 97 F (36.1 C)  TempSrc: Temporal  SpO2: 99%  Weight: 240 lb (108.9 kg)  Height: 5' 8 (1.727 m)   BMI Assessment: Estimated body mass index is 36.49 kg/m as calculated from the following:   Height as of this encounter: 5' 8 (1.727 m).   Weight as of this encounter: 240 lb (108.9 kg).  Surgical site: Wound is healing well. No redness, tenderness, discharge, abnormal odors, or any other evidence of infection or complications. SCS leads removed with tips intact Assessment  Primary Diagnosis & Pertinent Problem List: The primary encounter diagnosis was Diabetic polyneuropathy associated with type 2 diabetes mellitus (HCC). Diagnoses of Chronic painful diabetic neuropathy (HCC), Lumbar spondylosis, and Lumbar facet arthropathy were also pertinent to this visit.  Diagnosis Status  1. Diabetic polyneuropathy associated with type 2 diabetes mellitus (HCC)   2. Chronic painful diabetic neuropathy (HCC)   3. Lumbar spondylosis   4. Lumbar facet arthropathy    Controlled Controlled Controlled   Plan of Care Orders:  Orders Placed This Encounter  Procedures   DG PAIN CLINIC C-ARM 1-60 MIN NO REPORT    Intraoperative interpretation by procedural physician at [facility].    Standing Status:   Standing    Number of Occurrences:   1    Reason for exam::   Assistance in needle guidance and placement for procedures requiring needle placement in or near specific anatomical locations not easily accessible without such assistance.   Referral to Dr. Katrina for permanent implant  Medications administered: Melody Brooks had no medications administered during this visit.  See the medical record for exact dosing, route, and time of administration.    Follow-up plan:   No follow-ups on  file.     Recent Visits Date Type Provider Dept  04/30/24 Procedure visit Marcelino Nurse, MD [facility]  03/28/24 Procedure visit Marcelino Nurse, MD [facility]  03/20/24 Office Visit Marcelino Nurse, MD [facility]  Showing recent visits within past 90 days and meeting all other requirements Today's Visits Date Type Provider Dept  05/07/24 Procedure visit Marcelino Nurse, MD [facility]  Showing today's visits and meeting all other requirements Future Appointments No visits  were found meeting these conditions. Showing future appointments within next 90 days and meeting all other requirements  Disposition: Discharge home  Discharge (Date  Time): 05/07/2024;   hrs.   Primary Care Physician: Auston Reyes BIRCH, MD Location: [facility] Note by: Wallie Sherry, MD (TTS technology used. I apologize for any typographical errors that were not detected and corrected.) Date: 05/07/2024; Time: 8:20 AM

## 2024-05-07 NOTE — Progress Notes (Signed)
 Safety precautions to be maintained throughout the outpatient stay will include: orient to surroundings, keep bed in low position, maintain call bell within reach at all times, provide assistance with transfer out of bed and ambulation.

## 2024-05-08 ENCOUNTER — Telehealth: Payer: Self-pay

## 2024-05-08 NOTE — Telephone Encounter (Signed)
 No issues post-procedure SCS lead pull.

## 2024-06-07 ENCOUNTER — Other Ambulatory Visit: Payer: Self-pay

## 2024-06-07 ENCOUNTER — Encounter: Payer: Self-pay | Admitting: Neurosurgery

## 2024-06-07 ENCOUNTER — Ambulatory Visit: Admitting: Neurosurgery

## 2024-06-07 VITALS — BP 110/62 | Ht 68.0 in | Wt 240.0 lb

## 2024-06-07 DIAGNOSIS — E1140 Type 2 diabetes mellitus with diabetic neuropathy, unspecified: Secondary | ICD-10-CM | POA: Diagnosis not present

## 2024-06-07 DIAGNOSIS — Z7984 Long term (current) use of oral hypoglycemic drugs: Secondary | ICD-10-CM

## 2024-06-07 DIAGNOSIS — G894 Chronic pain syndrome: Secondary | ICD-10-CM

## 2024-06-07 DIAGNOSIS — Z01818 Encounter for other preprocedural examination: Secondary | ICD-10-CM

## 2024-06-07 NOTE — Patient Instructions (Signed)
 Please see below for information in regards to your upcoming surgery:   Planned surgery: thoracic laminectomy for spinal cord stimulator placement ([employer])   Surgery date: 06/22/24 at [facility] (Medical Mall: [address], [place], [place] [address]) - you will find out your arrival time the business day before your surgery.   Pre-op appointment at [facility] Pre-admit Testing: you will receive a call with a date/time for this appointment. If you are scheduled for an in person appointment, Pre-admit Testing is located on the first floor of the [facility] building, [address], [address]. During this appointment, they will advise you which medications you can take the morning of surgery, and which medications you will need to hold for surgery. Labs (such as blood work, EKG) may be done at your pre-op appointment. You are not required to fast for these labs. Should you need to change your pre-op appointment, please call Pre-admit testing at [phone].    Blood thinners:    Aspirin 81mg :  OK to stay on aspirin 81mg     Diabetes/heart failure/kidney disease/weight loss medications that require an extended hold: Per anesthesia guidelines (due to the increased risk of aspiration caused by delayed gastric emptying):  Janumet : hold for 2 days prior to surgery Semaglutide (Ozempic) injections: hold 7 days prior to surgery    Surgical clearance: we will send a clearance form to Dr Auston. They may wish to see you in their office prior to signing the clearance form. If so, they may call you to schedule an appointment.      Common restrictions after spine surgery: No bending, lifting, or twisting ("BLT"). Avoid lifting objects heavier than 10 pounds for the first 6 weeks after surgery. Where possible, avoid household activities that involve lifting, bending, reaching, pushing, or pulling such as laundry, vacuuming, grocery shopping, and  childcare. Try to arrange for help from friends and family for these activities while you heal. Do not drive while taking prescription pain medication. Weeks 6 through 12 after surgery: avoid lifting more than 25 pounds.    How to contact us :  If you have any questions/concerns before or after surgery, you can reach us  at [phone], or you can send a mychart message. We can be reached by phone or mychart 8am-4pm, Monday-Friday.  *Please note: Calls after 4pm are forwarded to a third party answering service. Mychart messages are not routinely monitored during evenings, weekends, and holidays. Please call our office to contact the answering service for urgent concerns during non-business hours.    If you have FMLA/disability paperwork, please drop it off or fax it to [phone]   Appointments/FMLA & disability paperwork: [employer]he [employer] R[employer] Registered Nurse/Surgery scheduler: Jamilyn Pigeon, RN Certified Medical Assistants: Don, CMA, Elenor, CMA, & Damien, CMA Physician Assistants: Lyle Decamp, PA-C, Edsel Goods, PA-C & Glade Boys, PA-C Surgeons: Penne Sharps, MD & Reeves Daisy, MD   [facility] PREADMIT TESTING VISIT and SURGERY INFORMATION SHEET   Now that surgery has been scheduled you can anticipate several phone calls from [facility] services. A pharmacy technician will call you to verify your current list of medications taken at home.               The Pre-Service Center will call to verify your insurance information and to give you billing estimates and information.             The Preadmit Testing Office will be calling to schedule a visit to obtain  information for the anesthesia team and provide instructions on preparation for surgery.  What can you expect for the Preadmit Testing Visit: Appointments may be scheduled in-person or by telephone.  If a telephone visit is scheduled, you may be asked to come into the office to have lab tests or other  studies performed.   This visit will not be completed any greater than 14 days prior to your surgery.  If your surgery has been scheduled for a future date, please do not be alarmed if we have not contacted you to schedule an appointment more than a month prior to the surgery date.    Please be prepared to provide the following information during this appointment:            -Personal medical history                                               -Medication and allergy list            -Any history of problems with anesthesia              -Recent lab work or diagnostic studies            -Please notify us  of any needs we should be aware of to provide the best care possible           -You will be provided with instructions on how to prepare for your surgery.    On The Day of Surgery:  You must have a driver to take you home after surgery, you will be asked not to drive for 24 hours following surgery.  Taxi, Gisele and non-medical transport will not be acceptable means of transportation unless you have a responsible individual who will be traveling with you.  Visitors in the surgical area:   2 people will be able to visit you in your room once your preparation for surgery has been completed. During surgery, your visitors will be asked to wait in the Surgery Waiting Area.  It is not a requirement for them to stay, if they prefer to leave and come back.  Your visitor(s) will be given an update once the surgery has been completed.  No visitors are allowed in the initial recovery room to respect patient privacy and safety.  Once you are more awake and transfer to the secondary recovery area, or are transferred to an inpatient room, visitors will again be able to see you.  To respect and protect your privacy: We will ask on the day of surgery who your driver will be and what the contact number for that individual will be. We will ask if it is okay to share information with this individual, or if there  is an alternative individual that we, or the surgeon, should contact to provide updates and information. If family or friends come to the surgical information desk requesting information about you, who you have not listed with us , no information will be given.   It may be helpful to designate someone as the main contact who will be responsible for updating your other friends and family.    PREADMIT TESTING OFFICE: [phone] SAME DAY SURGERY: [phone] We look forward to caring for you before and throughout the process of your surgery.

## 2024-06-07 NOTE — Progress Notes (Signed)
 Referring Physician:  Marcelino Nurse, MD [address] [place],  [place] [address]  Primary Physician:  Auston Reyes BIRCH, MD  History of Present Illness: 06/07/2024 Melody Brooks returns to see me.  She had a spinal cord stimulator evaluation.  She had 70% improvement.  She was able to do much more around the house.  It helped with her daily activities.  She was very enthusiastic about the response she had.  02/16/2024 Melody Brooks is here today with a chief complaint of back and right leg pain.  She is also suffering from diabetic neuropathy.  She has had back pain for many years but has been worsening with time.  It limits her ability to walk and go about her day-to-day life.  Standing and walking make it worse.  Sitting makes it better.  She has tried many conservative options without improvement.  Bowel/Bladder Dysfunction: none  Conservative measures:  Physical therapy:  has participated in at [facility] Multimodal medical therapy including regular antiinflammatories:  robaxin, lyrica , cymbalta, tylenol , flexeril, ibuprofen, prednisone,  Injections: has undergone  Past Surgery: none  Melody Brooks has no symptoms of cervical myelopathy.  The symptoms are causing a significant impact on the patient's life.   I have utilized the care everywhere function in epic to review the outside records available from external health systems.  Review of Systems:  A 10 point review of systems is negative, except for the pertinent positives and negatives detailed in the HPI.  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   Fatigue    Fibromuscular dysplasia (HCC)    Generalized headaches    GERD (gastroesophageal reflux disease)     Hemorrhoids    Hiatal hernia    Hypertension    Neuropathy    Palpitations    Pneumonia    Rectal bleeding    Rectal pain     Past Surgical History: Past Surgical History:  Procedure Laterality Date   Basal cell cancer  removal     colonoscopy  2010   EXCISION/RELEASE BURSA HIP Right 12/23/2022   Procedure: OPEN EXCISION OF RIGHT TROCHANTERIC BURSA;  Surgeon: Edie Norleen PARAS, MD;  Location: [facility] ORS;  Service: Orthopedics;  Laterality: Right;   EXCISIONAL HEMORRHOIDECTOMY  03/2012   IR ANGIO EXTERNAL CAROTID SEL EXT CAROTID BILAT MOD SED  09/04/2021   IR ANGIO INTRA EXTRACRAN SEL INTERNAL CAROTID BILAT MOD SED  09/04/2021   IR ANGIO VERTEBRAL SEL SUBCLAVIAN INNOMINATE UNI L MOD SED  09/04/2021   IR ANGIO VERTEBRAL SEL VERTEBRAL UNI R MOD SED  09/04/2021   IR US  GUIDE VASC ACCESS RIGHT  09/04/2021   right and left eye cataract surgery  2006, 2008   right in 2006 and left 2008   TUBAL LIGATION  1995    Allergies: Allergies as of 06/07/2024 - Review Complete 06/07/2024  Allergen Reaction Noted   Lisinopril Hives, Swelling, Itching, and Rash 03/12/2015    Medications:  Current Outpatient Medications:    acetaminophen  (TYLENOL ) 500 MG tablet, Take 1,000 mg by mouth every 6 (six) hours as needed for moderate pain., Disp: , Rfl:    Alcohol  Swabs  70 % PADS,  1 each by Does not apply route 2 (two) times daily., Disp: 100 each, Rfl: 12   Alcohol  Swabs  PADS, Used to check blood sugars 3-4 times a day. Dx E11.9., Disp: 360 each, Rfl: 3   amLODipine (NORVASC) 5 MG tablet, Take 5 mg by mouth daily., Disp: , Rfl:    aspirin 81 MG tablet, Take 81 mg by mouth daily., Disp: , Rfl:    Blood Glucose Monitoring Suppl (ONE TOUCH ULTRA MINI) w/Device KIT, 1 each by Does not apply route daily., Disp: 1 kit, Rfl: 0   Cholecalciferol (DIALYVITE VITAMIN D  5000) 125 MCG (5000 UT) capsule, Take 5,000 Units by mouth daily., Disp: , Rfl:    Cyanocobalamin  (B-12 IJ), Inject as directed every 14 (fourteen) days.,  Disp: , Rfl:    cyclobenzaprine (FLEXERIL) 10 MG tablet, Take 10 mg by mouth at bedtime., Disp: , Rfl:    diphenhydrAMINE (BENADRYL) 25 MG tablet, Take 25 mg by mouth 2 (two) times daily., Disp: , Rfl:    EPIPEN  2-PAK 0.3 MG/0.3ML SOAJ injection, Inject 0.3 mg into the muscle as needed for anaphylaxis., Disp: , Rfl:    esomeprazole (NEXIUM) 20 MG capsule, Take 40 mg by mouth daily at 12 noon., Disp: , Rfl:    estrogen, conjugated,-medroxyprogesterone (PREMPRO) 0.3-1.5 MG tablet, Take 1 tablet by mouth daily., Disp: , Rfl:    famotidine (PEPCID) 20 MG tablet, Take 20 mg by mouth at bedtime., Disp: , Rfl:    fluticasone-salmeterol (ADVAIR) 250-50 MCG/ACT AEPB, Inhale 1 puff into the lungs 2 (two) times daily as needed (chest congestion)., Disp: , Rfl:    furosemide (LASIX) 20 MG tablet, Take 20 mg by mouth daily., Disp: , Rfl:    glipiZIDE  (GLUCOTROL  XL) 2.5 MG 24 hr tablet, TAKE 1 TABLET BY MOUTH  DAILY WITH BREAKFAST, Disp: 90 tablet, Rfl: 2   glucose blood (CONTOUR NEXT TEST) test strip, TEST TWICE DAILY, Disp: 200 strip, Rfl: 3   Iron Combinations (IRON COMPLEX PO), Take 20 mg by mouth daily., Disp: , Rfl:    JANUMET  50-1000 MG tablet, TAKE 1 TABLET BY MOUTH  TWICE DAILY, Disp: 180 tablet, Rfl: 2   Lancets (ONETOUCH ULTRASOFT) lancets, USE TO TEST ONCE DAILY, Disp: 100 each, Rfl: 3   LORazepam  (ATIVAN ) 2 MG tablet, Take 1 tablet (2 mg total) by mouth at bedtime., Disp: , Rfl:    losartan (COZAAR) 50 MG tablet, TAKE 1 TABLET BY MOUTH  DAILY, Disp: 90 tablet, Rfl: 2   magnesium oxide (MAG-OX) 400 (240 Mg) MG tablet, Take 400 mg by mouth daily., Disp: , Rfl:    ONETOUCH ULTRA test strip, USE TO CHECK BLOOD SUGAR  (FASTING) EVERY MORNING.  TEST UP TO 4 TIMES DAILY IF HAVING HYPOGLYCEMIA., Disp: 400 each, Rfl: 3   phentermine (ADIPEX-P) 37.5 MG tablet, Take 37.5 mg by mouth every morning., Disp: , Rfl:    pioglitazone  (ACTOS ) 45 MG tablet, TAKE 1 TABLET BY MOUTH AT  BEDTIME, Disp: 60 tablet, Rfl: 5    predniSONE (DELTASONE) 10 MG tablet, Take 10 mg by mouth 3 (three) times daily as needed (hives/itching)., Disp: , Rfl:    pregabalin  (LYRICA ) 100 MG capsule, Take 1 capsule (100 mg total) by mouth 3 (three) times daily. (Patient taking differently: Take 100 mg by mouth 2 (two) times daily.), Disp: 270 capsule, Rfl: 3   propranolol ER (INDERAL LA) 60 MG 24 hr capsule, Take 60 mg by mouth daily., Disp: , Rfl:    rosuvastatin (CRESTOR) 10 MG  tablet, Take 10 mg by mouth daily., Disp: , Rfl:    Semaglutide (OZEMPIC, 0.25 OR 0.5 MG/DOSE, [place]), Inject into the skin once a week. Mondays, Disp: , Rfl:    spironolactone (ALDACTONE) 25 MG tablet, Take by mouth., Disp: , Rfl:    sucralfate (CARAFATE) 1 g tablet, Take by mouth., Disp: , Rfl:    topiramate (TOPAMAX) 50 MG tablet, Take 50 mg by mouth 2 (two) times daily., Disp: , Rfl:    zinc gluconate 50 MG tablet, Take 50 mg by mouth daily., Disp: , Rfl:    zolpidem  (AMBIEN ) 5 MG tablet, Take 1 tablet (5 mg total) by mouth at bedtime. (for peripheral neuropathy), Disp: , Rfl:   Social History: Social History   Tobacco Use   Smoking status: Never   Smokeless tobacco: Never   Tobacco comments:    tobacco use- no  Substance Use Topics   Alcohol  use: No   Drug use: No    Family Medical History: Family History  Problem Relation Age of Onset   Heart failure Mother    COPD Mother    Hypertension Mother    Arthritis Mother    Cancer Father        prostate   Parkinson's disease Father    Colon cancer Father    Cancer Maternal Aunt        colon   Cancer Paternal Grandmother        stomach   Diabetes Brother    Diabetes Brother     Physical Examination: Vitals:   06/07/24 1550  BP: 110/62    General: Patient is in no apparent distress. Attention to examination is appropriate.  Neck:   Supple.  Full range of motion.  Respiratory: Patient is breathing without any difficulty.   NEUROLOGICAL:     Awake, alert, oriented to person,  place, and time.  Speech is clear and fluent.   Cranial Nerves: Pupils equal round and reactive to light.  Facial tone is symmetric.  Facial sensation is symmetric. Shoulder shrug is symmetric. Tongue protrusion is midline.    Strength: Side Biceps Triceps Deltoid Interossei Grip Wrist Ext. Wrist Flex.  R 5 5 5 5 5 5 5   L 5 5 5 5 5 5 5    Side Iliopsoas Quads Hamstring PF DF EHL  R 5 5 5 5 5 5   L 5 5 5 5 5 5    Reflexes are 1+ and symmetric at the biceps, triceps, brachioradialis, patella and achilles.   Hoffman's is absent. Clonus is absent  Bilateral upper and lower extremity sensation is intact to light touch.     No evidence of dysmetria noted.  Gait is antalgic.    Imaging: MRI L spine 09/06/2023 L3-4: There is a shallow broad-based disc bulge, ligamentum flavum thickening and mild to moderate facet degenerative change. Mild to moderate central canal stenosis has progressed since the prior MRI. The foramina are open.   L4-5: Moderate to moderately severe facet degenerative change with a shallow disc bulge to the left ligamentum flavum thickening. There is mild narrowing in the left lateral recess. The foramina open.   L5-S1: Moderate bilateral facet arthropathy and a shallow disc bulge. No stenosis.   IMPRESSION: 1. Mild to moderate central canal stenosis at L3-4 has progressed since the prior MRI. No nerve root compression. 2. Mild narrowing in the left lateral recess at L4-5 is new since the prior MRI. No nerve root compression. 3. Convex left scoliosis.  Electronically Signed   By: Debby Prader M.D.   On: 09/29/2023 09:05    I have personally reviewed the images and agree with the above interpretation.  Medical Decision Making/Assessment and Plan: Melody Brooks is a pleasant 68 y.o. female with painful diabetic neuropathy.  She underwent a spinal cord stimulator evaluation and had an excellent response.  I think she is an excellent candidate for placement of  a permanent device.  Will plan for thoracic laminectomy for placement of a spinal cord stimulator.  She was trialed with a [employer]tr[employer] device.  I discussed the planned procedure at length with the patient, including the risks, benefits, alternatives, and indications. The risks discussed include but are not limited to bleeding, infection, need for reoperation, spinal fluid leak, stroke, vision loss, anesthetic complication, coma, paralysis, and even death. I also described in detail that improvement was not guaranteed.  The patient expressed understanding of these risks, and asked that we proceed with surgery. I described the surgery in layman's terms, and gave ample opportunity for questions, which were answered to the best of my ability.  I spent a total of 10 minutes in this patient's care today. This time was spent reviewing pertinent records including imaging studies, obtaining and confirming history, performing a directed evaluation, formulating and discussing my recommendations, and documenting the visit within the medical record.     Thank you for involving me in the care of this patient.    Reeves Daisy MD Neurosurgery

## 2024-06-07 NOTE — Addendum Note (Signed)
 Addended by: Kathye Cipriani on: 06/07/2024 04:35 PM   Modules accepted: Orders

## 2024-06-07 NOTE — H&P (View-Only) (Signed)
 Referring Physician:  Marcelino Nurse, MD [address] [place],  [place] [address]  Primary Physician:  Auston Reyes BIRCH, MD  History of Present Illness: 06/07/2024 Melody Brooks returns to see me.  She had a spinal cord stimulator evaluation.  She had 70% improvement.  She was able to do much more around the house.  It helped with her daily activities.  She was very enthusiastic about the response she had.  02/16/2024 Melody Brooks is here today with a chief complaint of back and right leg pain.  She is also suffering from diabetic neuropathy.  She has had back pain for many years but has been worsening with time.  It limits her ability to walk and go about her day-to-day life.  Standing and walking make it worse.  Sitting makes it better.  She has tried many conservative options without improvement.  Bowel/Bladder Dysfunction: none  Conservative measures:  Physical therapy:  has participated in at [facility] Multimodal medical therapy including regular antiinflammatories:  robaxin, lyrica , cymbalta, tylenol , flexeril, ibuprofen, prednisone,  Injections: has undergone  Past Surgery: none  Melody Brooks has no symptoms of cervical myelopathy.  The symptoms are causing a significant impact on the patient's life.   I have utilized the care everywhere function in epic to review the outside records available from external health systems.  Review of Systems:  A 10 point review of systems is negative, except for the pertinent positives and negatives detailed in the HPI.  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   Fatigue    Fibromuscular dysplasia (HCC)    Generalized headaches    GERD (gastroesophageal reflux disease)     Hemorrhoids    Hiatal hernia    Hypertension    Neuropathy    Palpitations    Pneumonia    Rectal bleeding    Rectal pain     Past Surgical History: Past Surgical History:  Procedure Laterality Date   Basal cell cancer  removal     colonoscopy  2010   EXCISION/RELEASE BURSA HIP Right 12/23/2022   Procedure: OPEN EXCISION OF RIGHT TROCHANTERIC BURSA;  Surgeon: Edie Norleen PARAS, MD;  Location: [facility] ORS;  Service: Orthopedics;  Laterality: Right;   EXCISIONAL HEMORRHOIDECTOMY  03/2012   IR ANGIO EXTERNAL CAROTID SEL EXT CAROTID BILAT MOD SED  09/04/2021   IR ANGIO INTRA EXTRACRAN SEL INTERNAL CAROTID BILAT MOD SED  09/04/2021   IR ANGIO VERTEBRAL SEL SUBCLAVIAN INNOMINATE UNI L MOD SED  09/04/2021   IR ANGIO VERTEBRAL SEL VERTEBRAL UNI R MOD SED  09/04/2021   IR US  GUIDE VASC ACCESS RIGHT  09/04/2021   right and left eye cataract surgery  2006, 2008   right in 2006 and left 2008   TUBAL LIGATION  1995    Allergies: Allergies as of 06/07/2024 - Review Complete 06/07/2024  Allergen Reaction Noted   Lisinopril Hives, Swelling, Itching, and Rash 03/12/2015    Medications:  Current Outpatient Medications:    acetaminophen  (TYLENOL ) 500 MG tablet, Take 1,000 mg by mouth every 6 (six) hours as needed for moderate pain., Disp: , Rfl:    Alcohol  Swabs  70 % PADS,  1 each by Does not apply route 2 (two) times daily., Disp: 100 each, Rfl: 12   Alcohol  Swabs  PADS, Used to check blood sugars 3-4 times a day. Dx E11.9., Disp: 360 each, Rfl: 3   amLODipine (NORVASC) 5 MG tablet, Take 5 mg by mouth daily., Disp: , Rfl:    aspirin 81 MG tablet, Take 81 mg by mouth daily., Disp: , Rfl:    Blood Glucose Monitoring Suppl (ONE TOUCH ULTRA MINI) w/Device KIT, 1 each by Does not apply route daily., Disp: 1 kit, Rfl: 0   Cholecalciferol (DIALYVITE VITAMIN D  5000) 125 MCG (5000 UT) capsule, Take 5,000 Units by mouth daily., Disp: , Rfl:    Cyanocobalamin  (B-12 IJ), Inject as directed every 14 (fourteen) days.,  Disp: , Rfl:    cyclobenzaprine (FLEXERIL) 10 MG tablet, Take 10 mg by mouth at bedtime., Disp: , Rfl:    diphenhydrAMINE (BENADRYL) 25 MG tablet, Take 25 mg by mouth 2 (two) times daily., Disp: , Rfl:    EPIPEN  2-PAK 0.3 MG/0.3ML SOAJ injection, Inject 0.3 mg into the muscle as needed for anaphylaxis., Disp: , Rfl:    esomeprazole (NEXIUM) 20 MG capsule, Take 40 mg by mouth daily at 12 noon., Disp: , Rfl:    estrogen, conjugated,-medroxyprogesterone (PREMPRO) 0.3-1.5 MG tablet, Take 1 tablet by mouth daily., Disp: , Rfl:    famotidine (PEPCID) 20 MG tablet, Take 20 mg by mouth at bedtime., Disp: , Rfl:    fluticasone-salmeterol (ADVAIR) 250-50 MCG/ACT AEPB, Inhale 1 puff into the lungs 2 (two) times daily as needed (chest congestion)., Disp: , Rfl:    furosemide (LASIX) 20 MG tablet, Take 20 mg by mouth daily., Disp: , Rfl:    glipiZIDE  (GLUCOTROL  XL) 2.5 MG 24 hr tablet, TAKE 1 TABLET BY MOUTH  DAILY WITH BREAKFAST, Disp: 90 tablet, Rfl: 2   glucose blood (CONTOUR NEXT TEST) test strip, TEST TWICE DAILY, Disp: 200 strip, Rfl: 3   Iron Combinations (IRON COMPLEX PO), Take 20 mg by mouth daily., Disp: , Rfl:    JANUMET  50-1000 MG tablet, TAKE 1 TABLET BY MOUTH  TWICE DAILY, Disp: 180 tablet, Rfl: 2   Lancets (ONETOUCH ULTRASOFT) lancets, USE TO TEST ONCE DAILY, Disp: 100 each, Rfl: 3   LORazepam  (ATIVAN ) 2 MG tablet, Take 1 tablet (2 mg total) by mouth at bedtime., Disp: , Rfl:    losartan (COZAAR) 50 MG tablet, TAKE 1 TABLET BY MOUTH  DAILY, Disp: 90 tablet, Rfl: 2   magnesium oxide (MAG-OX) 400 (240 Mg) MG tablet, Take 400 mg by mouth daily., Disp: , Rfl:    ONETOUCH ULTRA test strip, USE TO CHECK BLOOD SUGAR  (FASTING) EVERY MORNING.  TEST UP TO 4 TIMES DAILY IF HAVING HYPOGLYCEMIA., Disp: 400 each, Rfl: 3   phentermine (ADIPEX-P) 37.5 MG tablet, Take 37.5 mg by mouth every morning., Disp: , Rfl:    pioglitazone  (ACTOS ) 45 MG tablet, TAKE 1 TABLET BY MOUTH AT  BEDTIME, Disp: 60 tablet, Rfl: 5    predniSONE (DELTASONE) 10 MG tablet, Take 10 mg by mouth 3 (three) times daily as needed (hives/itching)., Disp: , Rfl:    pregabalin  (LYRICA ) 100 MG capsule, Take 1 capsule (100 mg total) by mouth 3 (three) times daily. (Patient taking differently: Take 100 mg by mouth 2 (two) times daily.), Disp: 270 capsule, Rfl: 3   propranolol ER (INDERAL LA) 60 MG 24 hr capsule, Take 60 mg by mouth daily., Disp: , Rfl:    rosuvastatin (CRESTOR) 10 MG  tablet, Take 10 mg by mouth daily., Disp: , Rfl:    Semaglutide (OZEMPIC, 0.25 OR 0.5 MG/DOSE, [place]), Inject into the skin once a week. Mondays, Disp: , Rfl:    spironolactone (ALDACTONE) 25 MG tablet, Take by mouth., Disp: , Rfl:    sucralfate (CARAFATE) 1 g tablet, Take by mouth., Disp: , Rfl:    topiramate (TOPAMAX) 50 MG tablet, Take 50 mg by mouth 2 (two) times daily., Disp: , Rfl:    zinc gluconate 50 MG tablet, Take 50 mg by mouth daily., Disp: , Rfl:    zolpidem  (AMBIEN ) 5 MG tablet, Take 1 tablet (5 mg total) by mouth at bedtime. (for peripheral neuropathy), Disp: , Rfl:   Social History: Social History   Tobacco Use   Smoking status: Never   Smokeless tobacco: Never   Tobacco comments:    tobacco use- no  Substance Use Topics   Alcohol  use: No   Drug use: No    Family Medical History: Family History  Problem Relation Age of Onset   Heart failure Mother    COPD Mother    Hypertension Mother    Arthritis Mother    Cancer Father        prostate   Parkinson's disease Father    Colon cancer Father    Cancer Maternal Aunt        colon   Cancer Paternal Grandmother        stomach   Diabetes Brother    Diabetes Brother     Physical Examination: Vitals:   06/07/24 1550  BP: 110/62    General: Patient is in no apparent distress. Attention to examination is appropriate.  Neck:   Supple.  Full range of motion.  Respiratory: Patient is breathing without any difficulty.   NEUROLOGICAL:     Awake, alert, oriented to person,  place, and time.  Speech is clear and fluent.   Cranial Nerves: Pupils equal round and reactive to light.  Facial tone is symmetric.  Facial sensation is symmetric. Shoulder shrug is symmetric. Tongue protrusion is midline.    Strength: Side Biceps Triceps Deltoid Interossei Grip Wrist Ext. Wrist Flex.  R 5 5 5 5 5 5 5   L 5 5 5 5 5 5 5    Side Iliopsoas Quads Hamstring PF DF EHL  R 5 5 5 5 5 5   L 5 5 5 5 5 5    Reflexes are 1+ and symmetric at the biceps, triceps, brachioradialis, patella and achilles.   Hoffman's is absent. Clonus is absent  Bilateral upper and lower extremity sensation is intact to light touch.     No evidence of dysmetria noted.  Gait is antalgic.    Imaging: MRI L spine 09/06/2023 L3-4: There is a shallow broad-based disc bulge, ligamentum flavum thickening and mild to moderate facet degenerative change. Mild to moderate central canal stenosis has progressed since the prior MRI. The foramina are open.   L4-5: Moderate to moderately severe facet degenerative change with a shallow disc bulge to the left ligamentum flavum thickening. There is mild narrowing in the left lateral recess. The foramina open.   L5-S1: Moderate bilateral facet arthropathy and a shallow disc bulge. No stenosis.   IMPRESSION: 1. Mild to moderate central canal stenosis at L3-4 has progressed since the prior MRI. No nerve root compression. 2. Mild narrowing in the left lateral recess at L4-5 is new since the prior MRI. No nerve root compression. 3. Convex left scoliosis.  Electronically Signed   By: Debby Prader M.D.   On: 09/29/2023 09:05    I have personally reviewed the images and agree with the above interpretation.  Medical Decision Making/Assessment and Plan: Melody Brooks is a pleasant 68 y.o. female with painful diabetic neuropathy.  She underwent a spinal cord stimulator evaluation and had an excellent response.  I think she is an excellent candidate for placement of  a permanent device.  Will plan for thoracic laminectomy for placement of a spinal cord stimulator.  She was trialed with a [employer] device.  I discussed the planned procedure at length with the patient, including the risks, benefits, alternatives, and indications. The risks discussed include but are not limited to bleeding, infection, need for reoperation, spinal fluid leak, stroke, vision loss, anesthetic complication, coma, paralysis, and even death. I also described in detail that improvement was not guaranteed.  The patient expressed understanding of these risks, and asked that we proceed with surgery. I described the surgery in layman's terms, and gave ample opportunity for questions, which were answered to the best of my ability.  I spent a total of 10 minutes in this patient's care today. This time was spent reviewing pertinent records including imaging studies, obtaining and confirming history, performing a directed evaluation, formulating and discussing my recommendations, and documenting the visit within the medical record.     Thank you for involving me in the care of this patient.    Reeves Daisy MD Neurosurgery

## 2024-06-12 ENCOUNTER — Ambulatory Visit: Admitting: Podiatry

## 2024-06-12 DIAGNOSIS — L539 Erythematous condition, unspecified: Secondary | ICD-10-CM

## 2024-06-12 DIAGNOSIS — E1142 Type 2 diabetes mellitus with diabetic polyneuropathy: Secondary | ICD-10-CM

## 2024-06-12 DIAGNOSIS — L97512 Non-pressure chronic ulcer of other part of right foot with fat layer exposed: Secondary | ICD-10-CM | POA: Diagnosis not present

## 2024-06-12 DIAGNOSIS — I999 Unspecified disorder of circulatory system: Secondary | ICD-10-CM

## 2024-06-12 MED ORDER — DOXYCYCLINE HYCLATE 100 MG PO TABS: 100.0000 mg | ORAL_TABLET | Freq: Two times a day (BID) | ORAL | 0 refills | Status: DC

## 2024-06-12 NOTE — Progress Notes (Unsigned)
 Subjective:  Patient ID: Melody Brooks, female    DOB: 07/10/1956,  MRN: [id number]  Chief Complaint  Patient presents with   Foot Pain    Pt stated that she has these sore places on the outside of her feet     68 y.o. female presents for wound care.  Patient presents with complaint of right submetatarsal 5 ulceration with fat layer exposed.  Patient states is causing her some discomfort.  She is a diabetic.  Wanted to get it evaluated pain scale is 5 out of 10 dull aching nature.  She has been keeping it covered herself.  She denies any other acute issues.   Review of Systems: Negative except as noted in the HPI. Denies N/V/F/Ch.  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 pt stated on around October of 2022   Angioedema    Arterial fibromuscular dysplasia (HCC) 07/08/2015   Arthritis    Cancer (HCC)    Carotid stenosis 12/16/2017   Chronic kidney disease    Chronic pain syndrome 03/20/2024   Chronic urticaria 09/10/2019   Constipation    Diabetes mellitus with polyneuropathy (HCC) 07/17/2003   Fatigue    Fibromuscular dysplasia (HCC)    Generalized headaches    GERD (gastroesophageal reflux disease)    Hemorrhoids    Hiatal hernia    Hypertension    Idiopathic scoliosis 07/30/2002   Lumbar facet arthropathy 03/20/2024   Lumbosacral radiculitis 12/11/2014   Neuropathy    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   a.) presurgical PCR (+) 06/13/2024 prior to THORACIC LAMINECTOMY FOR SCS IMPLANTATION   Palpitations    Pneumonia    Rectal bleeding    Rectal pain    Spinal stenosis 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 03/20/2024    Current Outpatient Medications:    doxycycline  (VIBRA -TABS) 100 MG tablet, Take 1 tablet (100 mg total) by mouth 2 (two) times daily., Disp: 20 tablet, Rfl: 0   acetaminophen  (TYLENOL ) 500 MG tablet, Take 1,000 mg by mouth every 6 (six) hours as needed for  moderate pain., Disp: , Rfl:    amLODipine (NORVASC) 5 MG tablet, Take 5 mg by mouth daily., Disp: , Rfl:    aspirin 81 MG tablet, Take 81 mg by mouth daily., Disp: , Rfl:    Cholecalciferol (DIALYVITE VITAMIN D  5000) 125 MCG (5000 UT) capsule, Take 5,000 Units by mouth daily., Disp: , Rfl:    Cyanocobalamin  (B-12 IJ), Inject 1,000 Units as directed every 14 (fourteen) days., Disp: , Rfl:    cyclobenzaprine (FLEXERIL) 10 MG tablet, Take 10 mg by mouth at bedtime., Disp: , Rfl:    diphenhydrAMINE (BENADRYL) 25 MG tablet, Take 25 mg by mouth 2 (two) times daily., Disp: , Rfl:    EPIPEN  2-PAK 0.3 MG/0.3ML SOAJ injection, Inject 0.3 mg into the muscle as needed for anaphylaxis., Disp: , Rfl:    esomeprazole (NEXIUM) 20 MG capsule, Take 40 mg by mouth daily at 12 noon., Disp: , Rfl:    estrogen, conjugated,-medroxyprogesterone (PREMPRO) 0.3-1.5 MG tablet, Take 1 tablet by mouth daily., Disp: , Rfl:    famotidine (PEPCID) 20 MG tablet, Take 20 mg by mouth at bedtime., Disp: , Rfl:    ferrous sulfate 325 (65 FE) MG tablet, Take 325 mg by mouth at bedtime., Disp: , Rfl:    fluticasone-salmeterol (ADVAIR) 250-50 MCG/ACT AEPB, Inhale 1 puff into the lungs 2 (two) times  daily as needed (chest congestion)., Disp: , Rfl:    furosemide (LASIX) 20 MG tablet, Take 20 mg by mouth daily., Disp: , Rfl:    glipiZIDE  (GLUCOTROL  XL) 2.5 MG 24 hr tablet, TAKE 1 TABLET BY MOUTH  DAILY WITH BREAKFAST, Disp: 90 tablet, Rfl: 2   JANUMET  50-1000 MG tablet, TAKE 1 TABLET BY MOUTH  TWICE DAILY (Patient taking differently: Take 1 tablet by mouth daily.), Disp: 180 tablet, Rfl: 2   LORazepam  (ATIVAN ) 2 MG tablet, Take 1 tablet (2 mg total) by mouth at bedtime. (Patient taking differently: Take 2 mg by mouth at bedtime as needed for sleep or anxiety.), Disp: , Rfl:    magnesium oxide (MAG-OX) 400 (240 Mg) MG tablet, Take 400 mg by mouth daily., Disp: , Rfl:    mupirocin  ointment (BACTROBAN ) 2 %, Apply small amount to the inside of  both nostrils TWICE a day for the next 5 days., Disp: 15 g, Rfl: 0   phentermine (ADIPEX-P) 37.5 MG tablet, Take 37.5 mg by mouth every morning., Disp: , Rfl:    pioglitazone  (ACTOS ) 45 MG tablet, TAKE 1 TABLET BY MOUTH AT  BEDTIME, Disp: 60 tablet, Rfl: 5   predniSONE (DELTASONE) 10 MG tablet, Take 10 mg by mouth 3 (three) times daily as needed (hives/itching)., Disp: , Rfl:    pregabalin  (LYRICA ) 100 MG capsule, Take 1 capsule (100 mg total) by mouth 3 (three) times daily. (Patient taking differently: Take 100 mg by mouth 2 (two) times daily.), Disp: 270 capsule, Rfl: 3   propranolol ER (INDERAL LA) 60 MG 24 hr capsule, Take 60 mg by mouth daily., Disp: , Rfl:    rosuvastatin (CRESTOR) 10 MG tablet, Take 10 mg by mouth daily., Disp: , Rfl:    Semaglutide (OZEMPIC, 0.25 OR 0.5 MG/DOSE, [place]), Inject 1 mg into the skin once a week. Mondays, Disp: , Rfl:    spironolactone (ALDACTONE) 25 MG tablet, Take 25 mg by mouth daily., Disp: , Rfl:    topiramate (TOPAMAX) 50 MG tablet, Take 50 mg by mouth 2 (two) times daily., Disp: , Rfl:    zinc gluconate 50 MG tablet, Take 50 mg by mouth daily., Disp: , Rfl:    zolpidem  (AMBIEN ) 5 MG tablet, Take 1 tablet (5 mg total) by mouth at bedtime. (for peripheral neuropathy), Disp: , Rfl:   Social History   Tobacco Use  Smoking Status Never  Smokeless Tobacco Never  Tobacco Comments   tobacco use- no    Allergies  Allergen Reactions   Lisinopril Hives, Itching, Swelling and Rash    Angioedema   Objective:  There were no vitals filed for this visit. There is no height or weight on file to calculate BMI. Constitutional Well developed. Well nourished.  Vascular Dorsalis pedis pulses nonpalpable bilaterally. Posterior tibial pulses nonpalpable bilaterally. Capillary refill normal to all digits.  No cyanosis or clubbing noted. Pedal hair growth not present  Neurologic Normal speech. Oriented to person, place, and time. Protective sensation absent   Dermatologic Wound Location: Right submet 5 fat layer exposed does not probe down to deep tissue.  No bone exposure noted no malodor present mild erythema noted.  No purulent drainage noted Wound Base: Mixed Granular/Fibrotic Peri-wound: Calloused Exudate: Scant/small amount Serosanguinous exudate Wound Measurements: - See below  Orthopedic: No pain to palpation either foot.   Radiographs: None Assessment:   1. Vascular abnormality   2. Foot ulcer with fat layer exposed, right (HCC)   3. Erythema   4. Diabetic  polyneuropathy associated with type 2 diabetes mellitus (HCC) [E11.42]    Plan:  Patient was evaluated and treated and all questions answered.  Ulcer right submet 5 ulceration fat layer exposed -Debridement as below. -Dressed with Betadine wet-to-dry, DSD. -Continue off-loading with surgical shoe. - Doxycycline  was dispensed for skin and soft tissue prophylaxis  Vascular abnormality - Patient will benefit from ABIs PVRs and referral to vascular surgery.  She states understanding  Procedure: Excisional Debridement of Wound Tool: Sharp chisel blade/tissue nipper Rationale: Removal of non-viable soft tissue from the wound to promote healing.  Anesthesia: none Pre-Debridement Wound Measurements: 1 cm x 0.5 cm x 0.3 cm  Post-Debridement Wound Measurements: 1.1 cm x 0.6 cm x 0.3 cm  Type of Debridement: Sharp Excisional Tissue Removed: Non-viable soft tissue Blood loss: Minimal (<50cc) Depth of Debridement: subcutaneous tissue. Technique: Sharp excisional debridement to bleeding, viable wound base.  Wound Progress: This is my initial evaluation will continue monitor the progression of the wound Site healing conversation 7 Dressing: Dry, sterile, compression dressing. Disposition: Patient tolerated procedure well. Patient to return in 1 week for follow-up.  No follow-ups on file.     Right submet 5 ulceration fat layer exposed Betadine wet-to-dry surgical  shoe  Doxycycline   Vascular abnormality ABIs PVRs and referral to vascular surgery

## 2024-06-13 ENCOUNTER — Other Ambulatory Visit: Payer: Self-pay

## 2024-06-13 ENCOUNTER — Encounter
Admission: RE | Admit: 2024-06-13 | Discharge: 2024-06-13 | Disposition: A | Discharge status: Home / Self Care | Source: Ambulatory Visit | Attending: Neurosurgery | Admitting: Neurosurgery

## 2024-06-13 VITALS — BP 149/76 | HR 72 | Temp 97.6°F | Resp 18 | Ht 68.0 in | Wt 245.0 lb

## 2024-06-13 DIAGNOSIS — I4510 Unspecified right bundle-branch block: Secondary | ICD-10-CM | POA: Diagnosis not present

## 2024-06-13 DIAGNOSIS — D509 Iron deficiency anemia, unspecified: Secondary | ICD-10-CM | POA: Diagnosis not present

## 2024-06-13 DIAGNOSIS — E1142 Type 2 diabetes mellitus with diabetic polyneuropathy: Secondary | ICD-10-CM

## 2024-06-13 DIAGNOSIS — I10 Essential (primary) hypertension: Secondary | ICD-10-CM | POA: Insufficient documentation

## 2024-06-13 DIAGNOSIS — Z01818 Encounter for other preprocedural examination: Secondary | ICD-10-CM | POA: Insufficient documentation

## 2024-06-13 DIAGNOSIS — Z22322 Carrier or suspected carrier of Methicillin resistant Staphylococcus aureus: Secondary | ICD-10-CM

## 2024-06-13 DIAGNOSIS — N390 Urinary tract infection, site not specified: Secondary | ICD-10-CM

## 2024-06-13 DIAGNOSIS — Z01810 Encounter for preprocedural cardiovascular examination: Secondary | ICD-10-CM

## 2024-06-13 DIAGNOSIS — E119 Type 2 diabetes mellitus without complications: Secondary | ICD-10-CM | POA: Diagnosis not present

## 2024-06-13 DIAGNOSIS — R7989 Other specified abnormal findings of blood chemistry: Secondary | ICD-10-CM

## 2024-06-13 DIAGNOSIS — Z01812 Encounter for preprocedural laboratory examination: Secondary | ICD-10-CM

## 2024-06-13 HISTORY — DX: Chronic kidney disease, unspecified: N18.9

## 2024-06-13 HISTORY — DX: Carrier or suspected carrier of methicillin resistant Staphylococcus aureus: Z22.322

## 2024-06-13 LAB — CBC
HCT: 35 % — ABNORMAL LOW (ref 36.0–46.0)
Hemoglobin: 11.3 g/dL — ABNORMAL LOW (ref 12.0–15.0)
MCH: 31.1 pg (ref 26.0–34.0)
MCHC: 32.3 g/dL (ref 30.0–36.0)
MCV: 96.4 fL (ref 80.0–100.0)
Platelets: 228 K/uL (ref 150–400)
RBC: 3.63 MIL/uL — ABNORMAL LOW (ref 3.87–5.11)
RDW: 14.2 % (ref 11.5–15.5)
WBC: 6.3 K/uL (ref 4.0–10.5)
nRBC: 0 % (ref 0.0–0.2)

## 2024-06-13 LAB — URINALYSIS, COMPLETE (UACMP) WITH MICROSCOPIC
Bilirubin Urine: NEGATIVE
Glucose, UA: NEGATIVE mg/dL
Hgb urine dipstick: NEGATIVE
Ketones, ur: NEGATIVE mg/dL
Leukocytes,Ua: NEGATIVE
Nitrite: NEGATIVE
Protein, ur: NEGATIVE mg/dL
Specific Gravity, Urine: 1.008 (ref 1.005–1.030)
pH: 6 (ref 5.0–8.0)

## 2024-06-13 LAB — BASIC METABOLIC PANEL WITH GFR
Anion gap: 6 (ref 5–15)
BUN: 28 mg/dL — ABNORMAL HIGH (ref 8–23)
CO2: 27 mmol/L (ref 22–32)
Calcium: 9.6 mg/dL (ref 8.9–10.3)
Chloride: 108 mmol/L (ref 98–111)
Creatinine, Ser: 1.75 mg/dL — ABNORMAL HIGH (ref 0.44–1.00)
GFR, Estimated: 31 mL/min — ABNORMAL LOW (ref >60)
Glucose, Bld: 116 mg/dL — ABNORMAL HIGH (ref 70–99)
Potassium: 4.4 mmol/L (ref 3.5–5.1)
Sodium: 141 mmol/L (ref 135–145)

## 2024-06-13 LAB — TYPE AND SCREEN
ABO/RH(D): A POS
Antibody Screen: NEGATIVE

## 2024-06-13 NOTE — Patient Instructions (Addendum)
 Your procedure is scheduled on:  FRIDAY   AUGUST  22 Report to the Registration Desk on the 1st floor of the [facility] To find out your arrival time, please call [phone] between 1PM - 3PM on:   THURSDAY   AUGUST 21 If your arrival time is 6:00 am, do not arrive before that time as the [facility] entrance doors do not open until 6:00 am.  REMEMBER: Instructions that are not followed completely may result in serious medical risk, up to and including death; or upon the discretion of your surgeon and anesthesiologist your surgery may need to be rescheduled.  Do not eat food after midnight the night before surgery.  No gum chewing or hard candies.  You may however, drink WATER  up to 2 hours before you are scheduled to arrive for your surgery. Do not drink anything within 2 hours of your scheduled arrival time.  One week prior to surgery:    FRIDAY AUGUST 15  Stop Anti-inflammatories (NSAIDS) such as Advil, Aleve, Ibuprofen, Motrin, Naproxen, Naprosyn and Aspirin based products such as Excedrin, Goody's Powder, BC Powder. Stop ANY OVER THE COUNTER supplements until after surgery. Cholecalciferol (DIALYVITE VITAMIN D  5000)  Cyanocobalamin  (B-12 IJ)  diphenhydrAMINE (BENADRYL)  ferrous sulfate 325 (65 FE)  magnesium oxide (MAG-OX)  zinc gluconate   You may however, continue to take Tylenol  if needed for pain up until the day of surgery.  **Follow guidelines for insulin and diabetes medications.** Janumet : hold for 2 days prior to surgery, last dose TUESDAY AUGUST  19   Semaglutide (Ozempic) injections: hold 7 days prior to surgery, last dose THURSDAY AUGUST 14   phentermine (ADIPEX-P) hold 7 days, last dose THURSDAY AUGUST 14  **Follow recommendations regarding stopping blood thinners.** Aspirin 81mg :  OK to stay on aspirin 81mg     Continue taking all of your other prescription medications up until the day of surgery.  ON THE DAY OF SURGERY ONLY TAKE THESE MEDICATIONS WITH  SIPS OF WATER :  pregabalin  (LYRICA )  propranolol ER (INDERAL LA)  famotidine (PEPCID)   Use inhalers on the day of surgery and bring to the hospital. fluticasone-salmeterol (ADVAIR)   No Alcohol  for 24 hours before or after surgery.  Do not use any recreational drugs for at least a week (preferably 2 weeks) before your surgery.  Please be advised that the combination of cocaine and anesthesia may have negative outcomes, up to and including death. If you test positive for cocaine, your surgery will be cancelled.  On the morning of surgery brush your teeth with toothpaste and water , you may rinse your mouth with mouthwash if you wish. Do not swallow any toothpaste or mouthwash.  Use CHG Soap as directed on instruction sheet.  Do not wear jewelry, make-up, hairpins, clips or nail polish.  For welded (permanent) jewelry: bracelets, anklets, waist bands, etc.  Please have this removed prior to surgery.  If it is not removed, there is a chance that hospital personnel will need to cut it off on the day of surgery.  Do not wear lotions, powders, or perfumes.   Do not shave body hair from the neck down 48 hours before surgery.  Do not bring valuables to the hospital. [facility] is not responsible for any missing/lost belongings or valuables.   Notify your doctor if there is any change in your medical condition (cold, fever, infection).  Wear comfortable clothing (specific to your surgery type) to the hospital.  After surgery, you can help prevent lung  complications by doing breathing exercises.  Take deep breaths and cough every 1-2 hours.   If you are being discharged the day of surgery, you will not be allowed to drive home. You will need a responsible individual to drive you home and stay with you for 24 hours after surgery.   If you are taking public transportation, you will need to have a responsible individual with you.  Please call the Pre-admissions Testing Dept. at [phone] if you have any questions about these instructions.  Surgery Visitation Policy:  Patients having surgery or a procedure may have two visitors.  Children under the age of 34 must have an adult with them who is not the patient.   Merchandiser, retail to address health-related social needs:  [email or web address]       Pre-operative 5 CHG Bath Instructions   You can play a key role in reducing the risk of infection after surgery. Your skin needs to be as free of germs as possible. You can reduce the number of germs on your skin by washing with CHG (chlorhexidine  gluconate) soap before surgery. CHG is an antiseptic soap that kills germs and continues to kill germs even after washing.   DO NOT use if you have an allergy to chlorhexidine /CHG or antibacterial soaps. If your skin becomes reddened or irritated, stop using the CHG and notify one of our RNs at [phone].   Please shower with the CHG soap starting 4 days before surgery using the following schedule:    STARTING SUNDAY AUGUST 17     Please keep in mind the following:  DO NOT shave, including legs and underarms, starting the day of your first shower.   You may shave your face at any point before/day of surgery.  Place clean sheets on your bed the day you start using CHG soap. Use a clean washcloth (not used since being washed) for each shower. DO NOT sleep with pets once you start using the CHG.   CHG Shower Instructions:  If you choose to wash your hair and private area, wash first with your normal shampoo/soap.  After you use shampoo/soap, rinse your hair and body thoroughly to remove shampoo/soap residue.  Turn the water  OFF and apply about 3 tablespoons (45 ml) of CHG soap to a CLEAN washcloth.  Apply CHG soap ONLY FROM YOUR NECK DOWN TO YOUR TOES (washing for 3-5 minutes)  DO NOT use CHG soap on face, private areas, open wounds, or sores.  Pay special attention to the area where your  surgery is being performed.  If you are having back surgery, having someone wash your back for you may be helpful. Wait 2 minutes after CHG soap is applied, then you may rinse off the CHG soap.  Pat dry with a clean towel  Put on clean clothes/pajamas   If you choose to wear lotion, please use ONLY the CHG-compatible lotions on the back of this paper.     Additional instructions for the day of surgery: DO NOT APPLY any lotions, deodorants, cologne, or perfumes.   Put on clean/comfortable clothes.  Brush your teeth.  Ask your nurse before applying any prescription medications to the skin.      CHG Compatible Lotions   Aveeno Moisturizing lotion  Cetaphil Moisturizing Cream  Cetaphil Moisturizing Lotion  Clairol Herbal Essence Moisturizing Lotion, Dry Skin  Clairol Herbal Essence Moisturizing Lotion, Extra Dry Skin  Clairol Herbal Essence Moisturizing Lotion, Normal Skin  Curel Age Defying Therapeutic Moisturizing Lotion  with Alpha Hydroxy  Curel Extreme Care Body Lotion  Curel Soothing Hands Moisturizing Hand Lotion  Curel Therapeutic Moisturizing Cream, Fragrance-Free  Curel Therapeutic Moisturizing Lotion, Fragrance-Free  Curel Therapeutic Moisturizing Lotion, Original Formula  Eucerin Daily Replenishing Lotion  Eucerin Dry Skin Therapy Plus Alpha Hydroxy Crme  Eucerin Dry Skin Therapy Plus Alpha Hydroxy Lotion  Eucerin Original Crme  Eucerin Original Lotion  Eucerin Plus Crme Eucerin Plus Lotion  Eucerin TriLipid Replenishing Lotion  Keri Anti-Bacterial Hand Lotion  Keri Deep Conditioning Original Lotion Dry Skin Formula Softly Scented  Keri Deep Conditioning Original Lotion, Fragrance Free Sensitive Skin Formula  Keri Lotion Fast Absorbing Fragrance Free Sensitive Skin Formula  Keri Lotion Fast Absorbing Softly Scented Dry Skin Formula  Keri Original Lotion  Keri Skin Renewal Lotion Keri Silky Smooth Lotion  Keri Silky Smooth Sensitive Skin Lotion  Nivea Body  Creamy Conditioning Oil  Nivea Body Extra Enriched Teacher, adult education Moisturizing Lotion Nivea Crme  Nivea Skin Firming Lotion  NutraDerm 30 Skin Lotion  NutraDerm Skin Lotion  NutraDerm Therapeutic Skin Cream  NutraDerm Therapeutic Skin Lotion  ProShield Protective Hand Cream  Provon moisturizing lotion

## 2024-06-14 ENCOUNTER — Ambulatory Visit: Payer: Self-pay | Admitting: Urgent Care

## 2024-06-14 DIAGNOSIS — Z22322 Carrier or suspected carrier of Methicillin resistant Staphylococcus aureus: Secondary | ICD-10-CM

## 2024-06-14 DIAGNOSIS — Z01812 Encounter for preprocedural laboratory examination: Secondary | ICD-10-CM

## 2024-06-14 LAB — SURGICAL PCR SCREEN
MRSA, PCR: POSITIVE — AB
Staphylococcus aureus: POSITIVE — AB

## 2024-06-14 MED ORDER — MUPIROCIN 2 % EX OINT: TOPICAL_OINTMENT | CUTANEOUS | 0 refills | Status: DC

## 2024-06-14 NOTE — Progress Notes (Signed)
  Perioperative Services  Abnormal Lab Notification and Treatment Plan of Care  Date: 06/14/24  Name: Melody Brooks DOB: 1956-05-08 MRN:   [id number]  Re: Abnormal labs noted during PAT appointment  Provider Notified: Clois Fret, MD Notification mode: Routed and/or faxed via [facility]  Labs of concern: Lab Results  Component Value Date   STAPHAUREUS POSITIVE (A) 06/13/2024   MRSAPCR POSITIVE (A) 06/13/2024    Notes: Patient is scheduled for a THORACIC LAMINECTOMY FOR SPINAL CORD STIMULATOR on 06/22/2024. She is scheduled to receive CEFAZOLIN  + VANCOMYCIN  pre-operatively. Pre-surgical PCR (+) for MRSA; see above.  PLANS:  Review renal function. Estimated Creatinine Clearance: 40.2 mL/min (A) (by C-G formula based on SCr of 1.75 mg/dL (H)).  Review allergies. No documented allergy to vancomycin .  Confirmed that orders for both CEFAZOLIN  + VANCOMYCIN  are in place in the setting of documented MRSA (+) surgical PCR.   Guidelines suggest that a beta-lactam antibiotic (first or second generation cephalosporin) should be added for activity against gram-negative organisms.  Vancomycin  appears to be less effective than cefazolin  for preventing SSIs caused by MSSA. For this reason, the use of vancomycin  in combination with cefazolin  is favored for prevention of SSI due to MRSA and coagulase-negative staphylococci.  Medical history in [facility] updated to reflect (+) PCR result indicating nasal MRSA colonization   Rx for additional preoperative nasal decolonization sent to patient's pharmacy of record.   Meds ordered this encounter  Medications   mupirocin  ointment (BACTROBAN ) 2 %    Sig: Apply small amount to the inside of both nostrils TWICE a day for the next 5 days.    Dispense:  15 g    Refill:  0   Encounter Diagnoses  Name Primary?   Pre-operative laboratory examination Yes   Nose colonized with MRSA    Dorise Pereyra, MSN, APRN, FNP-C, CEN [facility]   Perioperative Services Nurse Practitioner Phone: [phone] 06/14/24 10:46 AM

## 2024-06-22 ENCOUNTER — Ambulatory Visit: Payer: Self-pay | Admitting: Urgent Care

## 2024-06-22 ENCOUNTER — Other Ambulatory Visit: Payer: Self-pay

## 2024-06-22 ENCOUNTER — Ambulatory Visit
Admission: RE | Admit: 2024-06-22 | Discharge: 2024-06-22 | Disposition: A | Discharge status: Home / Self Care | Attending: Neurosurgery | Admitting: Neurosurgery

## 2024-06-22 ENCOUNTER — Ambulatory Visit

## 2024-06-22 ENCOUNTER — Encounter
Admission: RE | Disposition: A | Discharge status: Home / Self Care | Payer: Self-pay | Source: Home / Self Care | Attending: Neurosurgery

## 2024-06-22 ENCOUNTER — Encounter: Payer: Self-pay | Admitting: Neurosurgery

## 2024-06-22 DIAGNOSIS — K219 Gastro-esophageal reflux disease without esophagitis: Secondary | ICD-10-CM | POA: Diagnosis not present

## 2024-06-22 DIAGNOSIS — R519 Headache, unspecified: Secondary | ICD-10-CM | POA: Insufficient documentation

## 2024-06-22 DIAGNOSIS — Z6836 Body mass index (BMI) 36.0-36.9, adult: Secondary | ICD-10-CM | POA: Insufficient documentation

## 2024-06-22 DIAGNOSIS — G894 Chronic pain syndrome: Secondary | ICD-10-CM | POA: Diagnosis present

## 2024-06-22 DIAGNOSIS — N189 Chronic kidney disease, unspecified: Secondary | ICD-10-CM | POA: Diagnosis not present

## 2024-06-22 DIAGNOSIS — Z01810 Encounter for preprocedural cardiovascular examination: Secondary | ICD-10-CM

## 2024-06-22 DIAGNOSIS — Z79899 Other long term (current) drug therapy: Secondary | ICD-10-CM | POA: Insufficient documentation

## 2024-06-22 DIAGNOSIS — E1142 Type 2 diabetes mellitus with diabetic polyneuropathy: Secondary | ICD-10-CM | POA: Insufficient documentation

## 2024-06-22 DIAGNOSIS — E119 Type 2 diabetes mellitus without complications: Secondary | ICD-10-CM

## 2024-06-22 DIAGNOSIS — K449 Diaphragmatic hernia without obstruction or gangrene: Secondary | ICD-10-CM | POA: Diagnosis not present

## 2024-06-22 DIAGNOSIS — Z7985 Long-term (current) use of injectable non-insulin antidiabetic drugs: Secondary | ICD-10-CM | POA: Insufficient documentation

## 2024-06-22 DIAGNOSIS — E1140 Type 2 diabetes mellitus with diabetic neuropathy, unspecified: Secondary | ICD-10-CM

## 2024-06-22 DIAGNOSIS — E1122 Type 2 diabetes mellitus with diabetic chronic kidney disease: Secondary | ICD-10-CM | POA: Diagnosis not present

## 2024-06-22 DIAGNOSIS — I10 Essential (primary) hypertension: Secondary | ICD-10-CM

## 2024-06-22 DIAGNOSIS — I129 Hypertensive chronic kidney disease with stage 1 through stage 4 chronic kidney disease, or unspecified chronic kidney disease: Secondary | ICD-10-CM | POA: Diagnosis not present

## 2024-06-22 DIAGNOSIS — E669 Obesity, unspecified: Secondary | ICD-10-CM | POA: Insufficient documentation

## 2024-06-22 DIAGNOSIS — M4306 Spondylolysis, lumbar region: Secondary | ICD-10-CM

## 2024-06-22 DIAGNOSIS — Z7984 Long term (current) use of oral hypoglycemic drugs: Secondary | ICD-10-CM | POA: Insufficient documentation

## 2024-06-22 DIAGNOSIS — N390 Urinary tract infection, site not specified: Secondary | ICD-10-CM

## 2024-06-22 DIAGNOSIS — I6529 Occlusion and stenosis of unspecified carotid artery: Secondary | ICD-10-CM

## 2024-06-22 DIAGNOSIS — D509 Iron deficiency anemia, unspecified: Secondary | ICD-10-CM

## 2024-06-22 DIAGNOSIS — Z01812 Encounter for preprocedural laboratory examination: Secondary | ICD-10-CM

## 2024-06-22 DIAGNOSIS — R7989 Other specified abnormal findings of blood chemistry: Secondary | ICD-10-CM

## 2024-06-22 DIAGNOSIS — Z01818 Encounter for other preprocedural examination: Secondary | ICD-10-CM

## 2024-06-22 HISTORY — PX: THORACIC LAMINECTOMY FOR SPINAL CORD STIMULATOR: SHX6887

## 2024-06-22 LAB — GLUCOSE, CAPILLARY
Glucose-Capillary: 66 mg/dL — ABNORMAL LOW (ref 70–99)
Glucose-Capillary: 153 mg/dL — ABNORMAL HIGH (ref 70–99)

## 2024-06-22 LAB — ABO/RH: ABO/RH(D): A POS

## 2024-06-22 SURGERY — THORACIC LAMINECTOMY FOR SPINAL CORD STIMULATOR
Anesthesia: General

## 2024-06-22 MED ORDER — ORAL CARE MOUTH RINSE: 15.0000 mL | Freq: Once | OROMUCOSAL | Status: AC

## 2024-06-22 MED ORDER — METHYLPREDNISOLONE ACETATE 40 MG/ML IJ SUSP
INTRAMUSCULAR | Status: AC
  Filled 2024-06-22: qty 1

## 2024-06-22 MED ORDER — IRRISEPT - 450ML BOTTLE WITH 0.05% CHG IN STERILE WATER, USP 99.95% OPTIME
TOPICAL | Status: DC | PRN
  Administered 2024-06-22: 450 mL via TOPICAL

## 2024-06-22 MED ORDER — VANCOMYCIN HCL 1000 MG IV SOLR
INTRAVENOUS | Status: AC
  Filled 2024-06-22: qty 20

## 2024-06-22 MED ORDER — BUPIVACAINE-EPINEPHRINE (PF) 0.5% -1:200000 IJ SOLN
INTRAMUSCULAR | Status: DC | PRN
  Administered 2024-06-22: 10 mL

## 2024-06-22 MED ORDER — EPHEDRINE SULFATE-NACL 50-0.9 MG/10ML-% IV SOSY
PREFILLED_SYRINGE | INTRAVENOUS | Status: DC | PRN
  Administered 2024-06-22: 10 mg via INTRAVENOUS

## 2024-06-22 MED ORDER — PROPOFOL 10 MG/ML IV BOLUS
INTRAVENOUS | Status: DC | PRN
  Administered 2024-06-22: 150 mg via INTRAVENOUS

## 2024-06-22 MED ORDER — OXYCODONE HCL 5 MG PO TABS: 5.0000 mg | ORAL_TABLET | Freq: Once | ORAL | Status: DC | PRN

## 2024-06-22 MED ORDER — CHLORHEXIDINE GLUCONATE 0.12 % MT SOLN
OROMUCOSAL | Status: AC
  Filled 2024-06-22: qty 15

## 2024-06-22 MED ORDER — ONDANSETRON HCL 4 MG/2ML IJ SOLN
INTRAMUSCULAR | Status: DC | PRN
  Administered 2024-06-22: 4 mg via INTRAVENOUS

## 2024-06-22 MED ORDER — 0.9 % SODIUM CHLORIDE (POUR BTL) OPTIME
TOPICAL | Status: DC | PRN
Start: 2024-06-22 — End: 2024-06-22
  Administered 2024-06-22: 350 mL

## 2024-06-22 MED ORDER — METHOCARBAMOL 500 MG PO TABS
500.0000 mg | ORAL_TABLET | Freq: Four times a day (QID) | ORAL | 0 refills | Status: DC | PRN
  Filled 2024-06-22: qty 40, 10d supply, fill #0

## 2024-06-22 MED ORDER — PHENYLEPHRINE HCL-NACL 20-0.9 MG/250ML-% IV SOLN
INTRAVENOUS | Status: DC | PRN
  Administered 2024-06-22: 40 ug/min via INTRAVENOUS

## 2024-06-22 MED ORDER — SUCCINYLCHOLINE CHLORIDE 200 MG/10ML IV SOSY
PREFILLED_SYRINGE | INTRAVENOUS | Status: DC | PRN
  Administered 2024-06-22: 100 mg via INTRAVENOUS

## 2024-06-22 MED ORDER — REMIFENTANIL HCL 1 MG IV SOLR
INTRAVENOUS | Status: AC
  Filled 2024-06-22: qty 1000

## 2024-06-22 MED ORDER — CEFAZOLIN SODIUM-DEXTROSE 2-4 GM/100ML-% IV SOLN: 2.0000 g | Freq: Once | INTRAVENOUS | Status: DC

## 2024-06-22 MED ORDER — DEXTROSE 50 % IV SOLN
25.0000 mL | Freq: Once | INTRAVENOUS | Status: AC
  Administered 2024-06-22: 25 mL via INTRAVENOUS

## 2024-06-22 MED ORDER — LIDOCAINE HCL (CARDIAC) PF 100 MG/5ML IV SOSY
PREFILLED_SYRINGE | INTRAVENOUS | Status: DC | PRN
Start: 2024-06-22 — End: 2024-06-22
  Administered 2024-06-22: 60 mg via INTRAVENOUS

## 2024-06-22 MED ORDER — MIDAZOLAM HCL 2 MG/2ML IJ SOLN
INTRAMUSCULAR | Status: DC | PRN
  Administered 2024-06-22: 2 mg via INTRAVENOUS

## 2024-06-22 MED ORDER — SODIUM CHLORIDE (PF) 0.9 % IJ SOLN
INTRAMUSCULAR | Status: DC | PRN
  Administered 2024-06-22: 60 mL

## 2024-06-22 MED ORDER — BUPIVACAINE LIPOSOME 1.3 % IJ SUSP
INTRAMUSCULAR | Status: AC
  Filled 2024-06-22: qty 60

## 2024-06-22 MED ORDER — PROPOFOL 1000 MG/100ML IV EMUL
INTRAVENOUS | Status: AC
  Filled 2024-06-22: qty 100

## 2024-06-22 MED ORDER — FENTANYL CITRATE (PF) 100 MCG/2ML IJ SOLN: 25.0000 ug | INTRAMUSCULAR | Status: DC | PRN

## 2024-06-22 MED ORDER — SODIUM CHLORIDE 0.9 % IV SOLN: INTRAVENOUS | Status: DC

## 2024-06-22 MED ORDER — PHENYLEPHRINE HCL-NACL 20-0.9 MG/250ML-% IV SOLN
INTRAVENOUS | Status: AC
  Filled 2024-06-22: qty 250

## 2024-06-22 MED ORDER — BUPIVACAINE-EPINEPHRINE (PF) 0.5% -1:200000 IJ SOLN
INTRAMUSCULAR | Status: AC
  Filled 2024-06-22: qty 40

## 2024-06-22 MED ORDER — REMIFENTANIL HCL 1 MG IV SOLR
INTRAVENOUS | Status: DC | PRN
  Administered 2024-06-22: .1 ug/kg/min via INTRAVENOUS

## 2024-06-22 MED ORDER — PHENYLEPHRINE 80 MCG/ML (10ML) SYRINGE FOR IV PUSH (FOR BLOOD PRESSURE SUPPORT)
PREFILLED_SYRINGE | INTRAVENOUS | Status: DC | PRN
  Administered 2024-06-22 (×2): 160 ug via INTRAVENOUS

## 2024-06-22 MED ORDER — CEFAZOLIN SODIUM-DEXTROSE 2-4 GM/100ML-% IV SOLN
INTRAVENOUS | Status: AC
  Filled 2024-06-22: qty 100

## 2024-06-22 MED ORDER — VANCOMYCIN HCL 1000 MG/200ML IV SOLN
1000.0000 mg | Freq: Once | INTRAVENOUS | Status: AC
  Administered 2024-06-22: 1000 mg via INTRAVENOUS
  Filled 2024-06-22: qty 200

## 2024-06-22 MED ORDER — LACTATED RINGERS IV SOLN: INTRAVENOUS | Status: DC | PRN

## 2024-06-22 MED ORDER — MIDAZOLAM HCL 2 MG/2ML IJ SOLN
INTRAMUSCULAR | Status: AC
  Filled 2024-06-22: qty 2

## 2024-06-22 MED ORDER — DEXAMETHASONE SODIUM PHOSPHATE 10 MG/ML IJ SOLN
INTRAMUSCULAR | Status: DC | PRN
  Administered 2024-06-22: 5 mg via INTRAVENOUS

## 2024-06-22 MED ORDER — OXYCODONE HCL 5 MG/5ML PO SOLN: 5.0000 mg | Freq: Once | ORAL | Status: DC | PRN

## 2024-06-22 MED ORDER — BUPIVACAINE HCL (PF) 0.5 % IJ SOLN
INTRAMUSCULAR | Status: AC
  Filled 2024-06-22: qty 90

## 2024-06-22 MED ORDER — SURGIFLO WITH THROMBIN (HEMOSTATIC MATRIX KIT) OPTIME
TOPICAL | Status: DC | PRN
  Administered 2024-06-22: 1 via TOPICAL

## 2024-06-22 MED ORDER — PROPOFOL 10 MG/ML IV BOLUS
INTRAVENOUS | Status: AC
Start: 2024-06-22 — End: 2024-06-22
  Filled 2024-06-22: qty 20

## 2024-06-22 MED ORDER — PROPOFOL 500 MG/50ML IV EMUL
INTRAVENOUS | Status: DC | PRN
  Administered 2024-06-22: 100 ug/kg/min via INTRAVENOUS

## 2024-06-22 MED ORDER — SODIUM CHLORIDE (PF) 0.9 % IJ SOLN
INTRAMUSCULAR | Status: AC
  Filled 2024-06-22: qty 60

## 2024-06-22 MED ORDER — FENTANYL CITRATE (PF) 100 MCG/2ML IJ SOLN
INTRAMUSCULAR | Status: AC
  Filled 2024-06-22: qty 2

## 2024-06-22 MED ORDER — FENTANYL CITRATE (PF) 100 MCG/2ML IJ SOLN
INTRAMUSCULAR | Status: DC | PRN
  Administered 2024-06-22 (×2): 25 ug via INTRAVENOUS
  Administered 2024-06-22: 50 ug via INTRAVENOUS

## 2024-06-22 MED ORDER — OXYCODONE HCL 5 MG PO TABS
5.0000 mg | ORAL_TABLET | ORAL | 0 refills | Status: DC | PRN
  Filled 2024-06-22: qty 20, 4d supply, fill #0

## 2024-06-22 MED ORDER — CHLORHEXIDINE GLUCONATE 0.12 % MT SOLN
15.0000 mL | Freq: Once | OROMUCOSAL | Status: AC
  Administered 2024-06-22: 15 mL via OROMUCOSAL

## 2024-06-22 MED ORDER — DEXTROSE 50 % IV SOLN
INTRAVENOUS | Status: AC
  Filled 2024-06-22: qty 50

## 2024-06-22 SURGICAL SUPPLY — 37 items
BELT PT INTERSTIM MICRO SYSTEM (MISCELLANEOUS) IMPLANT
BUR NEURO DRILL SOFT 3.0X3.8M (BURR) ×1 IMPLANT
CONTROLLER HANDSET COMM KIT (NEUROSURGERY SUPPLIES) IMPLANT
DERMABOND ADVANCED .7 DNX12 (GAUZE/BANDAGES/DRESSINGS) ×2 IMPLANT
DRAPE C ARM PK CFD 31 SPINE (DRAPES) ×1 IMPLANT
DRAPE LAPAROTOMY 100X77 ABD (DRAPES) ×1 IMPLANT
DRSG OPSITE POSTOP 4X6 (GAUZE/BANDAGES/DRESSINGS) IMPLANT
DRSG OPSITE POSTOP 4X8 (GAUZE/BANDAGES/DRESSINGS) IMPLANT
ELECTRODE REM PT RTRN 9FT ADLT (ELECTROSURGICAL) ×1 IMPLANT
FEE INTRAOP CADWELL SUPPLY NCS (MISCELLANEOUS) IMPLANT
FEE INTRAOP MONITOR IMPULS NCS (MISCELLANEOUS) IMPLANT
GLOVE BIOGEL PI IND STRL 6.5 (GLOVE) ×1 IMPLANT
GLOVE SURG SYN 6.5 PF PI (GLOVE) ×1 IMPLANT
GLOVE SURG SYN 8.5 PF PI (GLOVE) ×3 IMPLANT
GOWN SRG LRG LVL 4 IMPRV REINF (GOWNS) ×1 IMPLANT
GOWN SRG XL LVL 3 NONREINFORCE (GOWNS) ×1 IMPLANT
GRAFT DURAGEN MATRIX 1WX1L (Tissue) IMPLANT
KIT SPINAL PRONEVIEW (KITS) ×1 IMPLANT
LAVAGE JET IRRISEPT WOUND (IRRIGATION / IRRIGATOR) ×1 IMPLANT
MANIFOLD NEPTUNE II (INSTRUMENTS) ×1 IMPLANT
MARKER SKIN DUAL TIP RULER LAB (MISCELLANEOUS) ×1 IMPLANT
NDL SAFETY ECLIPSE 18X1.5 (NEEDLE) ×1 IMPLANT
NEUROSTIM INCEPTIV (Neuro Prosthesis/Implant) IMPLANT
NS IRRIG 500ML POUR BTL (IV SOLUTION) ×1 IMPLANT
PACK LAMINECTOMY ARMC (PACKS) ×1 IMPLANT
PROGRAMMER AND COMM CASE (NEUROSURGERY SUPPLIES) IMPLANT
RECHARGER SYSTEM (NEUROSURGERY SUPPLIES) IMPLANT
STAPLER SKIN PROX 35W (STAPLE) ×1 IMPLANT
STIMULATOR CORD SURESCAN MRI (Stimulator) IMPLANT
SURGIFLO W/THROMBIN 8M KIT (HEMOSTASIS) ×1 IMPLANT
SUT SILK 2 0SH CR/8 30 (SUTURE) ×1 IMPLANT
SUT STRATA 3-0 15 PS-2 (SUTURE) ×2 IMPLANT
SUT VIC AB 0 CT1 18XCR BRD 8 (SUTURE) ×1 IMPLANT
SUT VIC AB 2-0 CT1 18 (SUTURE) ×1 IMPLANT
SYR 10ML LL (SYRINGE) IMPLANT
SYR 30ML LL (SYRINGE) ×2 IMPLANT
TRAP FLUID SMOKE EVACUATOR (MISCELLANEOUS) ×1 IMPLANT

## 2024-06-22 NOTE — Discharge Summary (Signed)
 Physician Discharge Summary  Patient ID: Melody Brooks MRN: [id number] DOB/AGE: Apr 23, 1956 68 y.o.  Admit date: 06/22/2024 Discharge date: 06/22/2024  Admission Diagnoses: Painful diabetic neuropathy Chronic pain syndrome  Discharge Diagnoses:  Painful diabetic neuropathy Chronic pain syndrome   Discharged Condition: good  Hospital Course: [name] presented with painful neuropathy.  She had a successful placement of a new spinal cord stimulator, and was stable for discharge after meeting appropriate criteria.  Consults: None  Significant Diagnostic Studies: radiology: X-Ray: localization of the top of the SCS lead  Treatments: surgery: Medtronic SCS placement  Discharge Exam: Blood pressure 120/61, pulse (!) 59, temperature (!) 96.8 F (36 C), resp. rate 13, height 5' 8 (1.727 m), weight 108.9 kg, last menstrual period 03/26/2012, SpO2 100%. General appearance: alert and cooperative CNI MAEW  Disposition: Discharge disposition: 01-Home or Self Care       Discharge Instructions     Discharge patient   Complete by: As directed    Discharge disposition: 01-Home or Self Care   Discharge patient date: 06/22/2024      Allergies as of 06/22/2024       Reactions   Lisinopril Hives, Itching, Swelling, Rash   Angioedema        Medication List     STOP taking these medications    mupirocin  ointment 2 % Commonly known as: BACTROBAN    predniSONE 10 MG tablet Commonly known as: DELTASONE       TAKE these medications    acetaminophen  500 MG tablet Commonly known as: TYLENOL  Take 1,000 mg by mouth every 6 (six) hours as needed for moderate pain.   amLODipine 5 MG tablet Commonly known as: NORVASC Take 5 mg by mouth daily.   aspirin 81 MG tablet Take 81 mg by mouth daily.   B-12 IJ Inject 1,000 Units as directed every 14 (fourteen) days.   cyclobenzaprine 10 MG tablet Commonly known as: FLEXERIL Take 10 mg by mouth at bedtime.   Dialyvite  Vitamin D  5000 125 MCG (5000 UT) capsule Generic drug: Cholecalciferol Take 5,000 Units by mouth daily.   diphenhydrAMINE 25 MG tablet Commonly known as: BENADRYL Take 25 mg by mouth 2 (two) times daily.   doxycycline  100 MG tablet Commonly known as: VIBRA -TABS Take 1 tablet (100 mg total) by mouth 2 (two) times daily.   EpiPen  2-Pak 0.3 MG/0.3ML Soaj injection Generic drug: EPINEPHrine  Inject 0.3 mg into the muscle as needed for anaphylaxis.   esomeprazole 20 MG capsule Commonly known as: NEXIUM Take 40 mg by mouth daily at 12 noon.   estrogen (conjugated)-medroxyprogesterone 0.3-1.5 MG tablet Commonly known as: PREMPRO Take 1 tablet by mouth daily.   famotidine 20 MG tablet Commonly known as: PEPCID Take 20 mg by mouth at bedtime.   ferrous sulfate 325 (65 FE) MG tablet Take 325 mg by mouth at bedtime.   fluticasone-salmeterol 250-50 MCG/ACT Aepb Commonly known as: ADVAIR Inhale 1 puff into the lungs 2 (two) times daily as needed (chest congestion).   furosemide 20 MG tablet Commonly known as: LASIX Take 20 mg by mouth daily.   glipiZIDE  2.5 MG 24 hr tablet Commonly known as: GLUCOTROL  XL TAKE 1 TABLET BY MOUTH  DAILY WITH BREAKFAST   Janumet  50-1000 MG tablet Generic drug: sitaGLIPtin-metformin TAKE 1 TABLET BY MOUTH  TWICE DAILY What changed: when to take this   LORazepam  2 MG tablet Commonly known as: ATIVAN  Take 1 tablet (2 mg total) by mouth at bedtime. What changed:  when to take  this reasons to take this   magnesium oxide 400 (240 Mg) MG tablet Commonly known as: MAG-OX Take 400 mg by mouth daily.   methocarbamol  500 MG tablet Commonly known as: ROBAXIN  Take 1 tablet (500 mg total) by mouth every 6 (six) hours as needed for muscle spasms.   oxyCODONE  5 MG immediate release tablet Commonly known as: Roxicodone  Take 1 tablet (5 mg total) by mouth every 4 (four) hours as needed for up to 5 days for moderate pain (pain score 4-6).   OZEMPIC  (0.25 OR 0.5 MG/DOSE) Comal Inject 1 mg into the skin once a week. Mondays   phentermine 37.5 MG tablet Commonly known as: ADIPEX-P Take 37.5 mg by mouth every morning.   pioglitazone  45 MG tablet Commonly known as: ACTOS  TAKE 1 TABLET BY MOUTH AT  BEDTIME   pregabalin  100 MG capsule Commonly known as: LYRICA  Take 1 capsule (100 mg total) by mouth 3 (three) times daily. What changed: when to take this   propranolol ER 60 MG 24 hr capsule Commonly known as: INDERAL LA Take 60 mg by mouth daily.   rosuvastatin 10 MG tablet Commonly known as: CRESTOR Take 10 mg by mouth daily.   spironolactone 25 MG tablet Commonly known as: ALDACTONE Take 25 mg by mouth daily.   topiramate 50 MG tablet Commonly known as: TOPAMAX Take 50 mg by mouth 2 (two) times daily.   zinc gluconate 50 MG tablet Take 50 mg by mouth daily.   zolpidem  5 MG tablet Commonly known as: AMBIEN  Take 1 tablet (5 mg total) by mouth at bedtime. (for peripheral neuropathy)        Follow-up Information     Melody Hastings, PA-C Follow up on 07/04/2024.   Specialty: Neurosurgery Why: 130 pm Contact information: [address] [place] [address] [phone]                 Signed: REEVES DAISY 06/22/2024, 9:29 AM

## 2024-06-22 NOTE — Op Note (Signed)
 Indications: the patient is a 68 yo female who was diagnosed with E11.40 Chronic painful diabetic neuropathy, G89.4 Chronic pain syndrome . The patient had a successful trial for spinal cord stimulation, so was consented for placement of a permanent device   Findings: successful placement of a Medtronic spinal cord stimulator.   Preoperative Diagnosis: E11.40 Chronic painful diabetic neuropathy, G89.4 Chronic pain syndrome  Postoperative Diagnosis: same     EBL: 20 ml IVF: see anesthesia record Drains: none Disposition: Extubated and Stable to PACU Complications: none   No foley catheter was placed.     Preoperative Note:    Risks of surgery discussed in clinic.   Operative Note:      The patient was then brought from the preoperative center with intravenous access established.  The patient underwent general anesthesia and endotracheal tube intubation, then was rotated on the [employer] table where all pressure points were appropriately padded.  An incision was marked with flouroscopy at T9, and on the right flank. The skin was then thoroughly cleansed.  Perioperative antibiotic prophylaxis was administered.  Sterile prep and drapes were then applied and a timeout was then observed.     Once this was complete an incision was opened with the use of a #10 blade knife in the midline at the thoracic incision.  The paraspinus muscled were subperiosteally dissected until the laminae of T9 and T10 were visualized. Flouroscopy was used to confirm the level. A self-retaining retractor was placed.   The rongeur was used to remove the spinous process of T9.  The drill was used to thin the bone until the ligamentum flavum was visualized.  The ligamentum was then removed and the dura visualized. This was widened until placement of the paddle lead was possible.     The lead was then advanced to the T7/8 disc space at the top of the lead.  The lead was secured with a 2-0 silk suture.     The incision  on the flank was then opened and a pocket formed until it was large enough for the pulse generator.  The tunneler was used to connect between the pocket and the incision.  The lead was inserted into the tunneler and tunneled to the flank.  The leads were attached to the IPG and impedances checked.  The leads were then tightened.  The IPG was then inserted into the pouch.   Both sites were irrigated.  The wounds were closed in layers with 0 and 2-0 vicryl.  The skin was approximated with monocryl. A sterile dressing was applied.   Monitoring was stable throughout.   Patient was then rotated back to the preoperative bed awakened from anesthesia and taken to recovery. All counts are correct in this case.   I performed the entire procedure.     Reeves Daisy MD

## 2024-06-22 NOTE — Anesthesia Postprocedure Evaluation (Signed)
 Anesthesia Post Note  Patient: JASMINNE MEALY  Procedure(s) Performed: THORACIC LAMINECTOMY FOR SPINAL CORD STIMULATOR  Patient location during evaluation: PACU Anesthesia Type: General Level of consciousness: awake and alert Pain management: pain level controlled Vital Signs Assessment: post-procedure vital signs reviewed and stable Respiratory status: spontaneous breathing, nonlabored ventilation, respiratory function stable and patient connected to nasal cannula oxygen Cardiovascular status: blood pressure returned to baseline and stable Postop Assessment: no apparent nausea or vomiting Anesthetic complications: no   No notable events documented.   Last Vitals:  Vitals:   06/22/24 1010 06/22/24 1028  BP: (!) 113/51 (!) 131/51  Pulse: (!) 58 (!) 59  Resp: 12 16  Temp: (!) 36.1 C (!) 36.1 C  SpO2: 100% 96%    Last Pain:  Vitals:   06/22/24 1028  TempSrc: Temporal  PainSc: 0-No pain                 Debby Mines

## 2024-06-22 NOTE — Anesthesia Preprocedure Evaluation (Signed)
 Anesthesia Evaluation  Patient identified by MRN, date of birth, ID band Patient awake    Reviewed: Allergy & Precautions, NPO status , Patient's Chart, lab work & pertinent test results  History of Anesthesia Complications Negative for: history of anesthetic complications  Airway Mallampati: III  TM Distance: >3 FB Neck ROM: Full    Dental  (+) Missing   Pulmonary neg pulmonary ROS   Pulmonary exam normal breath sounds clear to auscultation       Cardiovascular hypertension, On Medications negative cardio ROS Normal cardiovascular exam Rhythm:Regular Rate:Normal  ECG 12/17/22:  Normal sinus rhythm Incomplete right bundle branch block  Echo 04/15/22:  NORMAL LEFT VENTRICULAR SYSTOLIC FUNCTION  NORMAL RIGHT VENTRICULAR SYSTOLIC FUNCTION  TRIVIAL REGURGITATION NOTED  NO VALVULAR STENOSIS     Neuro/Psych  Headaches  Neuromuscular disease (neuropathy) negative neurological ROS  negative psych ROS   GI/Hepatic negative GI ROS, Neg liver ROS, hiatal hernia,GERD  ,,  Endo/Other  negative endocrine ROSdiabetes, Type 2  Obesity   Renal/GU      Musculoskeletal   Abdominal   Peds  Hematology negative hematology ROS (+)   Anesthesia Other Findings Past Medical History: No date: Anemia No date: Aneurysm (HCC)     Comment:  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 pt stated              on around October of 2022 No date: Angioedema 07/08/2015: Arterial fibromuscular dysplasia (HCC) No date: Arthritis No date: Cancer (HCC) 12/16/2017: Carotid stenosis No date: Chronic kidney disease 03/20/2024: Chronic pain syndrome 09/10/2019: Chronic urticaria No date: Constipation 07/17/2003: Diabetes mellitus with polyneuropathy (HCC) No date: Fatigue No date: Fibromuscular dysplasia (HCC) No date: Generalized headaches No date: GERD (gastroesophageal reflux disease) No date: Hemorrhoids No date: Hiatal hernia No  date: Hypertension 07/30/2002: Idiopathic scoliosis 03/20/2024: Lumbar facet arthropathy 12/11/2014: Lumbosacral radiculitis No date: Neuropathy 06/13/2024: Nose colonized with MRSA     Comment:  a.) presurgical PCR (+) 06/13/2024 prior to THORACIC               LAMINECTOMY FOR SCS IMPLANTATION No date: Palpitations No date: Pneumonia No date: Rectal bleeding No date: Rectal pain 03/20/2024: Spinal stenosis of lumbar region without neurogenic  claudication  Past Surgical History: No date: Basal cell cancer  removal 2010: colonoscopy 12/23/2022: EXCISION/RELEASE BURSA HIP; Right     Comment:  Procedure: OPEN EXCISION OF RIGHT TROCHANTERIC BURSA;                Surgeon: Edie Norleen PARAS, MD;  Location: [facility] ORS;                Service: Orthopedics;  Laterality: Right; 03/2012: EXCISIONAL HEMORRHOIDECTOMY 09/04/2021: IR ANGIO EXTERNAL CAROTID SEL EXT CAROTID BILAT MOD SED 09/04/2021: IR ANGIO INTRA EXTRACRAN SEL INTERNAL CAROTID BILAT MOD SED 09/04/2021: IR ANGIO VERTEBRAL SEL SUBCLAVIAN INNOMINATE UNI L MOD SED 09/04/2021: IR ANGIO VERTEBRAL SEL VERTEBRAL UNI R MOD SED 09/04/2021: IR US  GUIDE VASC ACCESS RIGHT 2006, 2008: right and left eye cataract surgery     Comment:  right in 2006 and left 2008 1995: TUBAL LIGATION  BMI    Body Mass Index: 36.49 kg/m      Reproductive/Obstetrics negative OB ROS                              Anesthesia Physical Anesthesia Plan  ASA: 2  Anesthesia Plan: General ETT   Post-op Pain Management:  Induction: Intravenous  PONV Risk Score and Plan: 3 and Ondansetron  and Dexamethasone   Airway Management Planned: Oral ETT  Additional Equipment:   Intra-op Plan:   Post-operative Plan: Extubation in OR  Informed Consent: I have reviewed the patients History and Physical, chart, labs and discussed the procedure including the risks, benefits and alternatives for the proposed anesthesia with the patient or authorized  representative who has indicated his/her understanding and acceptance.     Dental Advisory Given  Plan Discussed with: Anesthesiologist, CRNA and Surgeon  Anesthesia Plan Comments: (Patient consented for risks of anesthesia including but not limited to:  - adverse reactions to medications - damage to eyes, teeth, lips or other oral mucosa - nerve damage due to positioning  - sore throat or hoarseness - Damage to heart, brain, nerves, lungs, other parts of body or loss of life  Patient voiced understanding and assent.)        Anesthesia Quick Evaluation

## 2024-06-22 NOTE — Anesthesia Procedure Notes (Signed)
 Procedure Name: Intubation Date/Time: 06/22/2024 7:28 AM  Performed by: Niki Manus SAUNDERS, CRNAPre-anesthesia Checklist: Patient identified, Patient being monitored, Timeout performed, Emergency Drugs available and Suction available Patient Re-evaluated:Patient Re-evaluated prior to induction Oxygen Delivery Method: Circle system utilized Preoxygenation: Pre-oxygenation with 100% oxygen Induction Type: IV induction Ventilation: Mask ventilation without difficulty Laryngoscope Size: 4 and McGrath Grade View: Grade I Tube type: Oral Tube size: 7.0 mm Number of attempts: 1 Airway Equipment and Method: Stylet Placement Confirmation: ETT inserted through vocal cords under direct vision, positive ETCO2 and breath sounds checked- equal and bilateral Secured at: 21 cm Tube secured with: Tape Dental Injury: Teeth and Oropharynx as per pre-operative assessment

## 2024-06-22 NOTE — Interval H&P Note (Signed)
 History and Physical Interval Note:  06/22/2024 6:52 AM  Melody Brooks  has presented today for surgery, with the diagnosis of E11.40 Chronic painful diabetic neuropathy G89.4 Chronic pain syndrome.  The various methods of treatment have been discussed with the patient and family. After consideration of risks, benefits and other options for treatment, the patient has consented to  Procedure(s) with comments: THORACIC LAMINECTOMY FOR SPINAL CORD STIMULATOR (N/A) - THORACIC LAMINECTOMY FOR SPINAL CORD STIMULATOR PLACEMENT as a surgical intervention.  The patient's history has been reviewed, patient examined, no change in status, stable for surgery.  I have reviewed the patient's chart and labs.  Questions were answered to the patient's satisfaction.     Heart sounds normal no MRG. Chest Clear to Auscultation Bilaterally.  Vinicius Brockman

## 2024-06-22 NOTE — Discharge Instructions (Addendum)
 NEUROSURGERY DISCHARGE INSTRUCTIONS  Admission diagnosis: Chronic painful diabetic neuropathy (HCC) [E11.40] Chronic pain syndrome [G89.4]  Operative procedure: Spinal cord stimulator   What to do after you leave the hospital:  Recommended diet: regular diet. Increase protein intake to promote wound healing.  Recommended activity: no lifting, driving, or strenuous exercise for 2 weeks.You should walk multiple times per day  Special Instructions  No straining, no heavy lifting > 10lbs x 4 weeks.  Keep incision area clean and dry. May shower in 2 days. No baths or pools for 6 weeks.  Please remove dressing on 8/25, no need to apply a bandage afterwards  You have no sutures to remove, the skin is closed with adhesive  Please take pain medications as directed. Take a stool softener if on pain medications  *Regarding compression stockings-  Please wear day and night until you are walking a couple hundred feet three times a day.   Please Report any of the following: Nausea or Vomiting, Temperature is greater than 101.2F (38.1C) degrees, Dizziness, Abdominal Pain, Difficulty Breathing or Shortness of Breath, Inability to Eat, drink Fluids, or Take medications, Bleeding, swelling, or drainage from surgical incision sites, New numbness or weakness, and Bowel or bladder dysfunction to the neurosurgeon on call. How to contact us :  If you have any questions/concerns before or after surgery, you can reach us  at [phone], or you can send a mychart message. We can be reached by phone or mychart 8am-4pm, Monday-Friday.  *Please note: Calls after 4pm are forwarded to a third party answering service. Mychart messages are not routinely monitored during evenings, weekends, and holidays. Please call our office to contact the answering service for urgent concerns during non-business hours.   Additional Follow up appointments Please follow up with Glade Boys as scheduled in 2-3 weeks   Please see below  for scheduled appointments:  Future Appointments  Date Time Provider Department Center  07/04/2024  1:30 PM Boys Glade, PA-C [facility] None  07/06/2024  8:00 AM [facility] 1 [facility] None  07/06/2024  9:00 AM Dew, Selinda RAMAN, MD [facility] None  07/12/2024  1:45 PM Tobie Franky SQUIBB, DPM [facility]  08/02/2024  1:30 PM Clois Fret, MD [facility] None

## 2024-06-22 NOTE — Transfer of Care (Signed)
 Immediate Anesthesia Transfer of Care Note  Patient: Melody Brooks  Procedure(s) Performed: THORACIC LAMINECTOMY FOR SPINAL CORD STIMULATOR  Patient Location: PACU  Anesthesia Type:General  Level of Consciousness: drowsy  Airway & Oxygen Therapy: Patient Spontanous Breathing and Patient connected to face mask oxygen  Post-op Assessment: Report given to RN and Post -op Vital signs reviewed and stable  Post vital signs: Reviewed and stable  Last Vitals:  Vitals Value Taken Time  BP 120/61 06/22/24 09:21  Temp 36 C 06/22/24 09:21  Pulse 59 06/22/24 09:23  Resp 12 06/22/24 09:23  SpO2 100 % 06/22/24 09:23  Vitals shown include unfiled device data.  Last Pain:  Vitals:   06/22/24 0921  TempSrc:   PainSc: Asleep         Complications: No notable events documented.

## 2024-06-25 ENCOUNTER — Encounter: Payer: Self-pay | Admitting: Neurosurgery

## 2024-06-26 ENCOUNTER — Emergency Department (HOSPITAL_COMMUNITY)

## 2024-06-26 ENCOUNTER — Observation Stay (HOSPITAL_COMMUNITY): Admission: EM | Admit: 2024-06-26 | Discharge: 2024-06-26 | Disposition: A | Discharge status: Home / Self Care

## 2024-06-26 ENCOUNTER — Encounter (HOSPITAL_COMMUNITY): Payer: Self-pay

## 2024-06-26 ENCOUNTER — Other Ambulatory Visit: Payer: Self-pay

## 2024-06-26 DIAGNOSIS — N3000 Acute cystitis without hematuria: Secondary | ICD-10-CM | POA: Insufficient documentation

## 2024-06-26 DIAGNOSIS — R410 Disorientation, unspecified: Secondary | ICD-10-CM | POA: Diagnosis present

## 2024-06-26 DIAGNOSIS — Z780 Asymptomatic menopausal state: Secondary | ICD-10-CM

## 2024-06-26 DIAGNOSIS — I10 Essential (primary) hypertension: Secondary | ICD-10-CM | POA: Diagnosis present

## 2024-06-26 DIAGNOSIS — G9340 Encephalopathy, unspecified: Secondary | ICD-10-CM | POA: Diagnosis not present

## 2024-06-26 DIAGNOSIS — E1142 Type 2 diabetes mellitus with diabetic polyneuropathy: Secondary | ICD-10-CM

## 2024-06-26 DIAGNOSIS — I129 Hypertensive chronic kidney disease with stage 1 through stage 4 chronic kidney disease, or unspecified chronic kidney disease: Secondary | ICD-10-CM | POA: Diagnosis not present

## 2024-06-26 DIAGNOSIS — Z87440 Personal history of urinary (tract) infections: Secondary | ICD-10-CM

## 2024-06-26 DIAGNOSIS — Z79899 Other long term (current) drug therapy: Secondary | ICD-10-CM | POA: Diagnosis not present

## 2024-06-26 DIAGNOSIS — Z7984 Long term (current) use of oral hypoglycemic drugs: Secondary | ICD-10-CM

## 2024-06-26 DIAGNOSIS — G928 Other toxic encephalopathy: Secondary | ICD-10-CM | POA: Diagnosis not present

## 2024-06-26 DIAGNOSIS — G609 Hereditary and idiopathic neuropathy, unspecified: Secondary | ICD-10-CM | POA: Diagnosis not present

## 2024-06-26 DIAGNOSIS — E1165 Type 2 diabetes mellitus with hyperglycemia: Secondary | ICD-10-CM | POA: Diagnosis not present

## 2024-06-26 DIAGNOSIS — M545 Low back pain, unspecified: Secondary | ICD-10-CM | POA: Insufficient documentation

## 2024-06-26 DIAGNOSIS — M48061 Spinal stenosis, lumbar region without neurogenic claudication: Secondary | ICD-10-CM | POA: Diagnosis not present

## 2024-06-26 DIAGNOSIS — N1831 Chronic kidney disease, stage 3a: Secondary | ICD-10-CM | POA: Diagnosis not present

## 2024-06-26 DIAGNOSIS — N179 Acute kidney failure, unspecified: Secondary | ICD-10-CM | POA: Insufficient documentation

## 2024-06-26 DIAGNOSIS — E1140 Type 2 diabetes mellitus with diabetic neuropathy, unspecified: Secondary | ICD-10-CM | POA: Diagnosis present

## 2024-06-26 DIAGNOSIS — Z7982 Long term (current) use of aspirin: Secondary | ICD-10-CM | POA: Diagnosis not present

## 2024-06-26 DIAGNOSIS — G4700 Insomnia, unspecified: Secondary | ICD-10-CM | POA: Diagnosis not present

## 2024-06-26 DIAGNOSIS — Z79890 Hormone replacement therapy (postmenopausal): Secondary | ICD-10-CM | POA: Diagnosis not present

## 2024-06-26 DIAGNOSIS — E669 Obesity, unspecified: Secondary | ICD-10-CM | POA: Insufficient documentation

## 2024-06-26 DIAGNOSIS — M47896 Other spondylosis, lumbar region: Secondary | ICD-10-CM | POA: Diagnosis not present

## 2024-06-26 DIAGNOSIS — E1122 Type 2 diabetes mellitus with diabetic chronic kidney disease: Secondary | ICD-10-CM

## 2024-06-26 DIAGNOSIS — R441 Visual hallucinations: Secondary | ICD-10-CM | POA: Diagnosis not present

## 2024-06-26 DIAGNOSIS — Z9682 Presence of neurostimulator: Secondary | ICD-10-CM

## 2024-06-26 LAB — URINALYSIS, W/ REFLEX TO CULTURE (INFECTION SUSPECTED)
Bilirubin Urine: NEGATIVE
Glucose, UA: NEGATIVE mg/dL
Hgb urine dipstick: NEGATIVE
Ketones, ur: NEGATIVE mg/dL
Nitrite: NEGATIVE
Protein, ur: NEGATIVE mg/dL
Specific Gravity, Urine: 1.012 (ref 1.005–1.030)
pH: 5 (ref 5.0–8.0)

## 2024-06-26 LAB — CBC WITH DIFFERENTIAL/PLATELET
Abs Immature Granulocytes: 0.02 K/uL (ref 0.00–0.07)
Basophils Absolute: 0 K/uL (ref 0.0–0.1)
Basophils Relative: 0 %
Eosinophils Absolute: 0.6 K/uL — ABNORMAL HIGH (ref 0.0–0.5)
Eosinophils Relative: 7 %
HCT: 33.7 % — ABNORMAL LOW (ref 36.0–46.0)
Hemoglobin: 10.9 g/dL — ABNORMAL LOW (ref 12.0–15.0)
Immature Granulocytes: 0 %
Lymphocytes Relative: 18 %
Lymphs Abs: 1.5 K/uL (ref 0.7–4.0)
MCH: 30.8 pg (ref 26.0–34.0)
MCHC: 32.3 g/dL (ref 30.0–36.0)
MCV: 95.2 fL (ref 80.0–100.0)
Monocytes Absolute: 0.5 K/uL (ref 0.1–1.0)
Monocytes Relative: 6 %
Neutro Abs: 5.8 K/uL (ref 1.7–7.7)
Neutrophils Relative %: 69 %
Platelets: 195 K/uL (ref 150–400)
RBC: 3.54 MIL/uL — ABNORMAL LOW (ref 3.87–5.11)
RDW: 14.1 % (ref 11.5–15.5)
WBC: 8.4 K/uL (ref 4.0–10.5)
nRBC: 0 % (ref 0.0–0.2)

## 2024-06-26 LAB — COMPREHENSIVE METABOLIC PANEL WITH GFR
ALT: 15 U/L (ref 0–44)
AST: 20 U/L (ref 15–41)
Albumin: 2.8 g/dL — ABNORMAL LOW (ref 3.5–5.0)
Alkaline Phosphatase: 64 U/L (ref 38–126)
Anion gap: 9 (ref 5–15)
BUN: 29 mg/dL — ABNORMAL HIGH (ref 8–23)
CO2: 23 mmol/L (ref 22–32)
Calcium: 9.2 mg/dL (ref 8.9–10.3)
Chloride: 105 mmol/L (ref 98–111)
Creatinine, Ser: 1.62 mg/dL — ABNORMAL HIGH (ref 0.44–1.00)
GFR, Estimated: 34 mL/min — ABNORMAL LOW (ref >60)
Glucose, Bld: 145 mg/dL — ABNORMAL HIGH (ref 70–99)
Potassium: 4.1 mmol/L (ref 3.5–5.1)
Sodium: 137 mmol/L (ref 135–145)
Total Bilirubin: 0.8 mg/dL (ref 0.0–1.2)
Total Protein: 5.8 g/dL — ABNORMAL LOW (ref 6.5–8.1)

## 2024-06-26 LAB — VITAMIN B12: Vitamin B-12: >4000 pg/mL — ABNORMAL HIGH (ref 180–914)

## 2024-06-26 LAB — HIV ANTIBODY (ROUTINE TESTING W REFLEX): HIV Screen 4th Generation wRfx: NONREACTIVE

## 2024-06-26 LAB — TSH: TSH: 2.291 u[IU]/mL (ref 0.350–4.500)

## 2024-06-26 LAB — MAGNESIUM: Magnesium: 2 mg/dL (ref 1.7–2.4)

## 2024-06-26 LAB — FOLATE: Folate: >20 ng/mL (ref >5.9)

## 2024-06-26 MED ORDER — AMOXICILLIN-POT CLAVULANATE 875-125 MG PO TABS: 1.0000 | ORAL_TABLET | Freq: Two times a day (BID) | ORAL | Status: DC

## 2024-06-26 MED ORDER — INSULIN ASPART 100 UNIT/ML IJ SOLN: 0.0000 [IU] | Freq: Every day | INTRAMUSCULAR | Status: DC

## 2024-06-26 MED ORDER — AMLODIPINE BESYLATE 5 MG PO TABS: 5.0000 mg | ORAL_TABLET | Freq: Every day | ORAL | Status: DC

## 2024-06-26 MED ORDER — ZINC GLUCONATE 50 MG PO TABS: 50.0000 mg | ORAL_TABLET | Freq: Every day | ORAL | Status: DC

## 2024-06-26 MED ORDER — ROSUVASTATIN CALCIUM 5 MG PO TABS: 10.0000 mg | ORAL_TABLET | Freq: Every day | ORAL | Status: DC

## 2024-06-26 MED ORDER — SODIUM CHLORIDE 0.9 % IV SOLN
1.0000 g | Freq: Once | INTRAVENOUS | Status: AC
  Administered 2024-06-26: 1 g via INTRAVENOUS
  Filled 2024-06-26: qty 10

## 2024-06-26 MED ORDER — ASPIRIN 81 MG PO TBEC: 81.0000 mg | DELAYED_RELEASE_TABLET | Freq: Every day | ORAL | Status: DC

## 2024-06-26 MED ORDER — INSULIN ASPART 100 UNIT/ML IJ SOLN: 0.0000 [IU] | Freq: Three times a day (TID) | INTRAMUSCULAR | Status: DC

## 2024-06-26 MED ORDER — OXYCODONE HCL 5 MG PO TABS: 5.0000 mg | ORAL_TABLET | Freq: Once | ORAL | Status: DC

## 2024-06-26 MED ORDER — PROPRANOLOL HCL ER 60 MG PO CP24
60.0000 mg | ORAL_CAPSULE | Freq: Every day | ORAL | Status: DC
Start: 2024-06-27 — End: 2024-06-26

## 2024-06-26 MED ORDER — MAGNESIUM OXIDE -MG SUPPLEMENT 400 (240 MG) MG PO TABS
400.0000 mg | ORAL_TABLET | Freq: Every day | ORAL | Status: DC
Start: 2024-06-27 — End: 2024-06-26

## 2024-06-26 MED ORDER — MELATONIN 3 MG PO TABS: 3.0000 mg | ORAL_TABLET | Freq: Every day | ORAL | Status: DC

## 2024-06-26 MED ORDER — PHENTERMINE HCL 37.5 MG PO TABS: 37.5000 mg | ORAL_TABLET | Freq: Every morning | ORAL | Status: DC

## 2024-06-26 MED ORDER — PANTOPRAZOLE SODIUM 40 MG PO TBEC: 40.0000 mg | DELAYED_RELEASE_TABLET | Freq: Every day | ORAL | Status: DC

## 2024-06-26 MED ORDER — CHOLECALCIFEROL 125 MCG (5000 UT) PO CAPS: 5000.0000 [IU] | ORAL_CAPSULE | Freq: Every day | ORAL | Status: DC

## 2024-06-26 MED ORDER — SODIUM CHLORIDE 0.9 % IV BOLUS
500.0000 mL | Freq: Once | INTRAVENOUS | Status: AC
  Administered 2024-06-26: 500 mL via INTRAVENOUS

## 2024-06-26 MED ORDER — ENOXAPARIN SODIUM 40 MG/0.4ML IJ SOSY
40.0000 mg | PREFILLED_SYRINGE | INTRAMUSCULAR | Status: DC
  Administered 2024-06-26: 40 mg via SUBCUTANEOUS
  Filled 2024-06-26: qty 0.4

## 2024-06-26 MED ORDER — LIDOCAINE 5 % EX PTCH
2.0000 | MEDICATED_PATCH | CUTANEOUS | Status: DC
  Administered 2024-06-26: 2 via TRANSDERMAL
  Filled 2024-06-26: qty 2

## 2024-06-26 MED ORDER — FAMOTIDINE 20 MG PO TABS
20.0000 mg | ORAL_TABLET | Freq: Every day | ORAL | Status: DC
Start: 2024-06-27 — End: 2024-06-26

## 2024-06-26 MED ORDER — TOPIRAMATE 25 MG PO TABS: 50.0000 mg | ORAL_TABLET | Freq: Two times a day (BID) | ORAL | Status: DC

## 2024-06-26 MED ORDER — ACETAMINOPHEN 500 MG PO TABS
1000.0000 mg | ORAL_TABLET | Freq: Four times a day (QID) | ORAL | Status: DC
  Administered 2024-06-26: 1000 mg via ORAL
  Filled 2024-06-26: qty 2

## 2024-06-26 MED ORDER — THIAMINE HCL 100 MG/ML IJ SOLN
100.0000 mg | Freq: Every day | INTRAMUSCULAR | Status: DC
  Administered 2024-06-26: 100 mg via INTRAVENOUS
  Filled 2024-06-26: qty 2

## 2024-06-26 MED ORDER — OXYCODONE HCL 5 MG PO TABS
2.5000 mg | ORAL_TABLET | Freq: Once | ORAL | Status: AC
  Administered 2024-06-26: 2.5 mg via ORAL
  Filled 2024-06-26: qty 1

## 2024-06-26 MED ORDER — DICLOFENAC SODIUM 1 % EX GEL
4.0000 g | Freq: Four times a day (QID) | CUTANEOUS | Status: DC
  Filled 2024-06-26: qty 100

## 2024-06-26 MED ORDER — FERROUS SULFATE 325 (65 FE) MG PO TABS: 325.0000 mg | ORAL_TABLET | Freq: Every day | ORAL | Status: DC

## 2024-06-26 NOTE — ED Triage Notes (Addendum)
 Husband stated, she has been confused for 2 days ago. She has back stimulator insert on Friday and was put to sleep for 4 hours. Had an episode 3 years ago and went to [facility] and had depleted electrolytes and she stage 3 kidney disease. I see a Dr for my kidneys . Pt. Alert and oriented x 3 .

## 2024-06-26 NOTE — ED Provider Notes (Signed)
 [place] [facility] AT [facility] Provider Note   CSN: [id number] Arrival date & time: 06/26/24  9144     Patient presents with: Back Pain, Altered Mental Status, and Chest Pain   Melody Brooks is a 68 y.o. female.   68 year old female with a history of esophageal reflux, hypertension, fibromuscular dysplasia, diabetes, CKD presents to the ED for altered mental status. Husband states that he has noticed episodes of confusion and hallucinations for a few weeks, but symptoms have been worsening over the last week or so. Husband does clarify and confirm that symptoms were present BEFORE placement of a spinal stimulator 4 days ago. Had a hx of similar episodes 3 years prior when she was diagnosed with AKI and a UTI. Patient underwent treatment for presumed UTI 1 month ago, but no follow up was done to ensure resolution of infection. Did also recently complete a course of doxycycline  prescribed by her podiatrist on 06/12/24 for a foot sore. She has been on oxycodone  for pain since her surgery on Friday. Husband denies any redness or drainage from surgical wounds. No associated fevers. Denies CP, SOB, syncope, vomiting.  The history is provided by the spouse and the patient. No language interpreter was used.  Back Pain Associated symptoms: chest pain   Altered Mental Status Chest Pain Associated symptoms: altered mental status and back pain        Prior to Admission medications   Medication Sig Start Date End Date Taking? Authorizing Provider  acetaminophen  (TYLENOL ) 500 MG tablet Take 1,000 mg by mouth every 6 (six) hours as needed for moderate pain.   Yes [provider]  topiramate  (TOPAMAX ) 50 MG tablet Take 50 mg by mouth 2 (two) times daily. 06/03/24  Yes [provider]  amLODipine  (NORVASC ) 5 MG tablet Take 5 mg by mouth daily. 11/25/23   [provider]  aspirin  81 MG tablet Take 81 mg by mouth daily.    [provider]   Cholecalciferol  (DIALYVITE VITAMIN D  5000) 125 MCG (5000 UT) capsule Take 5,000 Units by mouth daily.    [provider]  Cyanocobalamin  (B-12 IJ) Inject 1,000 Units as directed every 14 (fourteen) days.    [provider]  cyclobenzaprine (FLEXERIL) 10 MG tablet Take 10 mg by mouth at bedtime.    [provider]  diphenhydrAMINE (BENADRYL) 25 MG tablet Take 25 mg by mouth 2 (two) times daily.    [provider]  doxycycline  (VIBRA -TABS) 100 MG tablet Take 1 tablet (100 mg total) by mouth 2 (two) times daily. 06/12/24   Tobie Franky SQUIBB, DPM  EPIPEN  2-PAK 0.3 MG/0.3ML SOAJ injection Inject 0.3 mg into the muscle as needed for anaphylaxis. 10/05/15   [provider]  esomeprazole (NEXIUM) 20 MG capsule Take 40 mg by mouth daily at 12 noon.    [provider]  estrogen, conjugated,-medroxyprogesterone (PREMPRO) 0.3-1.5 MG tablet Take 1 tablet by mouth daily.    [provider]  famotidine  (PEPCID ) 20 MG tablet Take 20 mg by mouth at bedtime.    [provider]  ferrous sulfate  325 (65 FE) MG tablet Take 325 mg by mouth at bedtime.    [provider]  fluticasone-salmeterol (ADVAIR) 250-50 MCG/ACT AEPB Inhale 1 puff into the lungs 2 (two) times daily as needed (chest congestion).    [provider]  furosemide (LASIX) 20 MG tablet Take 20 mg by mouth daily.    [provider]  glipiZIDE  (GLUCOTROL  XL) 2.5  MG 24 hr tablet TAKE 1 TABLET BY MOUTH  DAILY WITH BREAKFAST 04/21/20   Chrismon, Marinda E, PA-C  JANUMET  50-1000 MG tablet TAKE 1 TABLET BY MOUTH  TWICE DAILY Patient taking differently: Take 1 tablet by mouth daily. 04/21/20   Chrismon, Marinda BRAVO, PA-C  LORazepam  (ATIVAN ) 2 MG tablet Take 1 tablet (2 mg total) by mouth at bedtime. Patient taking differently: Take 2 mg by mouth at bedtime as needed for sleep or anxiety. 12/23/22   Poggi, Norleen PARAS, MD  magnesium  oxide (MAG-OX) 400 (240 Mg) MG tablet Take 400 mg  by mouth daily.    [provider]  methocarbamol  (ROBAXIN ) 500 MG tablet Take 1 tablet (500 mg total) by mouth every 6 (six) hours as needed for muscle spasms. 06/22/24   Clois Fret, MD  oxyCODONE  (ROXICODONE ) 5 MG immediate release tablet Take 1 tablet (5 mg total) by mouth every 4 (four) hours as needed for up to 5 days for moderate pain (pain score 4-6). 06/22/24 06/27/24  Clois Fret, MD  phentermine  (ADIPEX-P ) 37.5 MG tablet Take 37.5 mg by mouth every morning. 05/24/24   [provider]  pioglitazone  (ACTOS ) 45 MG tablet TAKE 1 TABLET BY MOUTH AT  BEDTIME 04/11/20   Chrismon, Marinda BRAVO, PA-C  pregabalin  (LYRICA ) 100 MG capsule Take 1 capsule (100 mg total) by mouth 3 (three) times daily. Patient taking differently: Take 100 mg by mouth 2 (two) times daily. 07/28/20   Chrismon, Marinda BRAVO, PA-C  propranolol  ER (INDERAL  LA) 60 MG 24 hr capsule Take 60 mg by mouth daily.    [provider]  rosuvastatin  (CRESTOR ) 10 MG tablet Take 10 mg by mouth daily. 03/11/21   [provider]  Semaglutide (OZEMPIC, 0.25 OR 0.5 MG/DOSE, [place]) Inject 1 mg into the skin once a week. Mondays    [provider]  spironolactone (ALDACTONE) 25 MG tablet Take 25 mg by mouth daily.    [provider]  zinc  gluconate 50 MG tablet Take 50 mg by mouth daily.    [provider]  zolpidem  (AMBIEN ) 5 MG tablet Take 1 tablet (5 mg total) by mouth at bedtime. (for peripheral neuropathy) 12/23/22   Poggi, Norleen PARAS, MD    Allergies: Lisinopril    Review of Systems  Cardiovascular:  Positive for chest pain.  Musculoskeletal:  Positive for back pain.  Ten systems reviewed and are negative for acute change, except as noted in the HPI.    Updated Vital Signs BP (!) 141/67   Pulse 72   Temp 98.7 F (37.1 C)   Resp 13   LMP 03/26/2012 (Approximate)   SpO2 94%   Physical Exam Vitals and nursing note reviewed.  Constitutional:      General: She is not in  acute distress.    Appearance: She is well-developed. She is not diaphoretic.     Comments: Obese female, in NAD. Nontoxic.  HENT:     Head: Normocephalic and atraumatic.  Eyes:     General: No scleral icterus.    Conjunctiva/sclera: Conjunctivae normal.  Cardiovascular:     Rate and Rhythm: Normal rate and regular rhythm.     Pulses: Normal pulses.  Pulmonary:     Effort: Pulmonary effort is normal. No respiratory distress.     Breath sounds: No stridor.     Comments: Respirations even and unlabored. Musculoskeletal:        General: Normal range of motion.     Cervical back: Normal range of  motion.     Comments: Incisions to thoracic midline and R flank are C/D/I. No drainage or significant erythema or warmth noted. No crepitus.  Skin:    General: Skin is warm and dry.     Coloration: Skin is not pale.     Findings: No erythema or rash.  Neurological:     General: No focal deficit present.     Mental Status: She is alert and oriented to person, place, and time.     Coordination: Coordination normal.     Comments: GCS 15. Speech is goal oriented. No deficits appreciated to CN III-XII; symmetric eyebrow raise, no facial drooping. Patient moves extremities without ataxia. No obvious focal deficit noted on exam.  Psychiatric:        Behavior: Behavior normal.     (all labs ordered are listed, but only abnormal results are displayed) Labs Reviewed  COMPREHENSIVE METABOLIC PANEL WITH GFR - Abnormal; Notable for the following components:      Result Value   Glucose, Bld 145 (*)    BUN 29 (*)    Creatinine, Ser 1.62 (*)    Total Protein 5.8 (*)    Albumin 2.8 (*)    GFR, Estimated 34 (*)    All other components within normal limits  CBC WITH DIFFERENTIAL/PLATELET - Abnormal; Notable for the following components:   RBC 3.54 (*)    Hemoglobin 10.9 (*)    HCT 33.7 (*)    Eosinophils Absolute 0.6 (*)    All other components within normal limits  URINALYSIS, W/ REFLEX TO CULTURE  (INFECTION SUSPECTED) - Abnormal; Notable for the following components:   APPearance CLOUDY (*)    Leukocytes,Ua MODERATE (*)    Bacteria, UA MANY (*)    All other components within normal limits  URINE CULTURE  MAGNESIUM   HIV ANTIBODY (ROUTINE TESTING W REFLEX)  CBG MONITORING, ED    EKG: None  Radiology: CT Head Wo Contrast Result Date: 06/26/2024 CLINICAL DATA:  Mental status change, unknown cause. EXAM: CT HEAD WITHOUT CONTRAST TECHNIQUE: Contiguous axial images were obtained from the base of the skull through the vertex without intravenous contrast. RADIATION DOSE REDUCTION: This exam was performed according to the departmental dose-optimization program which includes automated exposure control, adjustment of the mA and/or kV according to patient size and/or use of iterative reconstruction technique. COMPARISON:  MRI brain from 08/21/2021. FINDINGS: Brain: No evidence of acute infarction, hemorrhage, hydrocephalus, extra-axial collection or mass lesion/mass effect. Ventricles are normal. Cerebral volume is age appropriate. Vascular: No hyperdense vessel or unexpected calcification. Skull: Normal. Negative for fracture or focal lesion. Sinuses/Orbits: No acute finding. Other: Visualized mastoid air cells are unremarkable. No mastoid effusion. IMPRESSION: *No acute intracranial abnormality. Electronically Signed   By: Ree Molt M.D.   On: 06/26/2024 10:37   DG Chest 1 View Result Date: 06/26/2024 CLINICAL DATA:  [id number] Confusion [id number]. EXAM: CHEST  1 VIEW COMPARISON:  04/03/2012. FINDINGS: Linear area of scarring/atelectasis noted overlying the left lateral costophrenic angle. Bilateral lung fields are otherwise clear. Bilateral costophrenic angles are clear. Normal cardio-mediastinal silhouette. No acute osseous abnormalities. The soft tissues are within normal limits. Neurostimulator device noted overlying the right upper abdomen with its lead overlying the midthoracic spine region.  IMPRESSION: No active disease. Electronically Signed   By: Ree Molt M.D.   On: 06/26/2024 10:35     Procedures   Medications Ordered in the ED  enoxaparin  (LOVENOX ) injection 40 mg (40 mg Subcutaneous Given 06/26/24 1428)  cefTRIAXone  (  ROCEPHIN ) 1 g in sodium chloride  0.9 % 100 mL IVPB (0 g Intravenous Stopped 06/26/24 1310)  sodium chloride  0.9 % bolus 500 mL (0 mLs Intravenous Stopped 06/26/24 1420)    Clinical Course as of 06/26/24 1430  Tue Jun 26, 2024  1227 Spoke with IM teaching service regarding admission. Their service will come and assess the patient in the ED. [KH]    Clinical Course User Index [KH] Keith Sor, PA-C                                 Medical Decision Making Amount and/or Complexity of Data Reviewed Labs: ordered.  Risk Decision regarding hospitalization.   This patient presents to the ED for concern of confusion/AMS, this involves an extensive number of treatment options, and is a complaint that carries with it a high risk of complications and morbidity.  The differential diagnosis includes meningitis vs ICH vs brain mass vs sepsis vs DKA vs psychosis vs side effects of opiates   Co morbidities that complicate the patient evaluation  HTN Fibromuscular dysplasia DM   Additional history obtained:  Additional history obtained from spouse, at bedside External records from outside source obtained and reviewed including historical creatinine levels, most recently from 06/13/24.   Lab Tests:  I Ordered, and personally interpreted labs.  The pertinent results include:  Hgb 10.9 (stable), Creatinine 1.62, UA with bacteriuria and moderate leukocytes.   Imaging Studies ordered:  I ordered imaging studies including CXR and head CT  I independently visualized and interpreted imaging which showed no acute pathology I agree with the radiologist interpretation   Cardiac Monitoring:  The patient was maintained on a cardiac monitor.  I personally  viewed and interpreted the cardiac monitored which showed an underlying rhythm of: NSR   Medicines ordered and prescription drug management:  I ordered medication including Rocephin  for UTI, IVF for AKI  Reevaluation of the patient after these medicines showed that the patient stayed the same I have reviewed the patients home medicines and have made adjustments as needed   Test Considered:  Lactic acid   Critical Interventions:  IV Rocephin  for UTI   Consultations Obtained:  I requested consultation with the IM teaching service and discussed lab and imaging findings as well as pertinent plan - they will assess the patient in the ED for admission.   Problem List / ED Course:  As above Spouse concerned with hallucinations, confusion. Onset prior to recent surgery. Hx of same with UTI. Appears to have same today which may be contributing to encephalopathy. Has also been on postop narcotics since Friday which could be exacerbating symptoms. No concern for sepsis. Afebrile in the ED with reassuring WBC count. VSS.  Exam nonfocal with negative head CT. Not reacting to internal stimuli while being examined at bedside.   Reevaluation:  After the interventions noted above, I reevaluated the patient and found that they have :stayed the same   Social Determinants of Health:  Good social support; husband at bedside   Dispostion:  After consideration of the diagnostic results and the patients response to treatment, I feel that the patent would benefit from admission for ongoing monitoring, IV abx.       Final diagnoses:  Acute encephalopathy  Acute cystitis without hematuria  AKI (acute kidney injury) [facility])    ED Discharge Orders     None          Marcile Fuquay,  [place], PA-C 06/26/24 1437    Simon Lavonia SAILOR, MD 06/27/24 [phone]

## 2024-06-26 NOTE — Hospital Course (Signed)
 Melody Brooks is a 68 y.o. 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   Acute toxic metabolic encephalopathy Visual hallucinations Presented with 2 weeks of worsening confusion and visual hallucinations.  Also had spinal cord stimulator placed on 06/22/2024 but no significant change in symptoms worse or better after this.  Overall most consistent with multiple centrally acting medications with the possibility of a concomitant urinary tract infection.  Compared to her previous episode with similar symptoms she does not have a severe AKI and her urinalysis appears contaminated with squamous epithelial cells.  She is still on a significant amount of centrally acting medications with the addition of 2 more in the past 4 days coinciding with some worsening of her symptoms.  She was significantly vitamin B12 deficient 3 years ago but last vitamin B12 level from 01/31/2024 was normal at 981.  TSH normal at 1.558 on 04/17/2024.  Metabolic panel and cell count did not show signs of significant electrolyte abnormality, infection, or anemia.  She has a family history of Parkinson's disease but does not show any bradykinesia. CT of the head without contrast did not show any significant abnormalities and most recent MRI of the brain from 08/2021 showed mild generalized cerebral atrophy and a small area in the left frontoparietal white matter that could indicate chronic small vessel ischemia.  She was admitted for further evaluation and trial off several of her centrally acting medications.  Vitamin B12 checked and returned supratherapeutic at over 4000 and TSH was 2.291.  A few hours after my evaluation  the patient wanted to go home if she was not can to get IV antibiotics and due to lack of urinary tract symptoms and no signs of systemic infection with plan to discontinue antibiotics.  She received extensive counseling on the interaction of all of her centrally acting medications and was instructed to stop these medications until she talk to her primary care provider and then slowly add them back as indicated.  Recommended to have close PCP follow-up and given strict return precautions before discharge   ?Urinary tract infection Patient has a history of multiple urinary tract infections most reticently Enterococcus faecalis and previously Klebsiella pneumoniae with 1 being resistant ceftriaxone , ampicillin, and nitrofurantoin and another being resistant to ciprofloxacin.  She does not have any symptoms of a urinary tract infection and although there is leukocyte esterase on UA there is no pyuria and there are significant amount of squamous epithelial cells.  She received a dose of ceftriaxone  in the ED.  Due to the lack of systemic signs of infection and questionable susceptibility of Enterococcus to cephalosporins along with the resistance patterns of her previous UTIs we thought to switch to Augmentin  if she showed signs of systemic infection or symptomatic UTI but due to the lack of these we decided to discontinue antibiotics.   Lumbar spondylosis Chronic back pain Peripheral neuropathy No signs of increasing back pain or other worrisome signs for epidural abscess after recent spinal surgery.  Overlying skin looks to be healing well without significant irritation or inflammation.  We recommended that she hold her centrally acting medications that include oxycodone , cyclobenzaprine, methocarbamol , and pregabalin .  Recommend that she uses Tylenol  and topical therapies and then works with her primary care provider  for safely adding pain medications and muscle relaxers back.     CKD stage IIIa  Baseline  creatinine appears to be between 1.3-1.6 and creatinine here is 1.62 which is slightly decreased from 06/13/2024 when it was 1.75.  No reported change in urinary habits including decreased urine output.  Urinalysis here without significant protein or blood.  She received 500 mL normal saline bolus in the ED and appears to have a good appetite and ability to orally hydrate after that.   T2DM A1c 5.9 on 03/08/2024.  She is not on insulin  but is on glipizide  however here she is not hypoglycemic.  She was also on metformin/sitagliptin but it appears she was told to hold this due to her increased creatinine this month.  Otherwise she has not taken her semaglutide in roughly 2 weeks after holding it 1 week before her surgery on 8/22.  Resume current medications on discharge.   Hypertension Home medications include amlodipine , spironolactone, propranolol , and furosemide.  Mildly hypertensive here in the 140s systolic.  No signs of orthostatic issues at home.  Appears euvolemic on exam.  Home medications resumed on discharge.   Insomnia As above we will hold her sleep aids including zolpidem  and lorazepam .  Recommend melatonin 3 mg nightly.   Obesity Patient is on combination of topiramate  and phentermine  as well as a GLP-1 agonist for diabetes.  Due to the risk of abruptly stopping topiramate  and phentermine  causing possible seizures, mental depression, fatigue, and worsening mood disorder we will continue these medicines for now. I would recommend considering a slow taper off topiramate  and phentermine  in the outpatient setting.   Postmenopausal Female on hormone therapy She is prescribed Prempro which is conjugated estrogen and medroxyprogesterone pill that is usually used for severe vasomotor symptoms, moderate to severe vulvar/vaginal atrophy, and prevention of postmenopausal osteoporosis.  Unable to see any of her OB/GYN notes.  If this medication is used primarily for vasomotor symptoms she could switch  to paroxetine or another SSRI/SNRI which could alleviate the need for some of her other centrally acting medicines without the increased risk of VTE.

## 2024-06-26 NOTE — ED Provider Triage Note (Signed)
 Emergency Medicine Provider Triage Evaluation Note  Melody Brooks , a 68 y.o. female  was evaluated in triage.  Pt complains of of intermittent confusion.  Similar to when she had what sounds like renal failure and electrolyte disturbances a few years ago at [facility].  Symptoms have been ongoing since a spinal stimulator was put in 4 days ago.  Review of Systems  Positive: Confusion, hearing things Negative: Fever  Physical Exam  BP (!) 128/56 (BP Location: Left Arm)   Pulse 75   Temp 98.5 F (36.9 C)   Resp 19   LMP 03/26/2012 (Approximate)   SpO2 93%  Gen:   Awake, no distress   Resp:  Normal effort  Other:  Awake, alert, no current confusion  Medical Decision Making  Medically screening exam initiated at 9:43 AM.  Appropriate orders placed.  Melody Brooks was informed that the remainder of the evaluation will be completed by another provider, this initial triage assessment does not replace that evaluation, and the importance of remaining in the ED until their evaluation is complete.  Labs, CT head, chest x-ray ordered.   Freddi Hamilton, MD 06/26/24 [phone]

## 2024-06-26 NOTE — ED Notes (Signed)
 CCMD contacted to place the patient on cardiac monitoring services.

## 2024-06-26 NOTE — Discharge Instructions (Addendum)
 To [name] or their caretakers,  They were admitted to [facility] on 06/26/2024 for evaluation and treatment of:  Principal Problem:   Acute encephalopathy Active Problems:   Essential hypertension   Diabetes mellitus with polyneuropathy (HCC)   Insomnia   Chronic painful diabetic neuropathy (HCC)   Spinal stenosis 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   Stage 3a chronic kidney disease (CKD) (HCC)   AKI (acute kidney injury) (HCC)   Visual hallucination  Resolved Problems:   * No resolved hospital problems. *  The evaluation suggested confusion due to unsafe medications. They were observed for safety for a several-hour period.  They were discharged from the hospital on 06/26/24. I recommend the following after leaving the hospital:   Review your medication list for medications that were stopped. Talk to your doctor about these medications.  Medications Discontinued During This Encounter  Medication Reason   zinc  gluconate tablet 50 mg P&T Policy: Herbal Product    oxyCODONE  (Oxy IR/ROXICODONE ) immediate release tablet 5 mg    pregabalin  (LYRICA ) 100 MG capsule Stop Taking at Discharge   diphenhydrAMINE (BENADRYL) 25 MG tablet Stop Taking at Discharge   cyclobenzaprine (FLEXERIL) 10 MG tablet Stop Taking at Discharge   LORazepam  (ATIVAN ) 2 MG tablet Stop Taking at Discharge   zolpidem  (AMBIEN ) 5 MG tablet Stop Taking at Discharge   doxycycline  (VIBRA -TABS) 100 MG tablet Stop Taking at Discharge   oxyCODONE  (ROXICODONE ) 5 MG immediate release tablet Stop Taking at Discharge   methocarbamol  (ROBAXIN ) 500 MG tablet Stop Taking at Discharge   For questions about your care plan, until you are able to see your primary doctor: Call [phone]. Dial [phone] for the operator. Ask for the internal medicine resident on call.  Ozell Kung MD 06/26/2024, 5:42 PM

## 2024-06-26 NOTE — H&P (Signed)
 Date: 06/26/2024               Patient Name:  Melody Brooks MRN: [id number]  DOB: Sep 12, 1956 Age / Sex: 68 y.o., female   PCP: Auston Reyes BIRCH, MD         Medical Service: Internal Medicine Teaching Service         Attending Physician: Dr. Shawn Sick, MD      First Contact: Dr. Remonia Romano, DO    Second Contact: Dr. 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  Her husband provides most of the positive history which includes visual hallucinations such as green frogs crawling out of the toilet and auditory/perceptive hallucinations including being worried about waking up a baby that is not in the house.  These hallucinations do not appear immediately distressing to the patient and she remembers verbalizing these hallucinations but does not particularly remember the hallucinations themselves.  She does not currently have a hallucinations.  As above she did recently undergo surgery with the addition of oxycodone  and methocarbamol  afterwards but her symptoms were present roughly 4 days before surgery and did not immediately get worse after surgery.  She has some stable urinary urge incontinence that is mild but denies any other signs or symptoms of urinary tract infection including dysuria, increased urinary frequency, suprapubic pain, or fevers.  Other than the addition of oxycodone  and methocarbamol  she stopped taking her duloxetine about a month ago without a significant reason or side  effect.  She has also had some intermittent headaches with bilateral ear fullness and a pulsating sensation inside of her ears that occurred most recently a couple days ago and lasted about 1 and half hours but spontaneously resolved after a nap although it was still present for about 20 minutes after she woke up.  She denies any trouble with her vision, dizziness or lightheadedness, dysphagia, increased back pain, chest pain, shortness of breath, cough, nausea, vomiting, abdominal pain, change in bowel or bladder habits, or increased difficulty walking although she has been slowly recovering from back surgery and her surgeon told her to slowly increase her activity by walking with a walker through the house at first which she has been able to do.  Admitted to [facility] in 05/2022 for similar but more severe symptoms and was found to have severe AKI on CKD stage IIIa with creatinine up from a baseline of 1.4 to 3.57, vitamin B12 deficiency, and likely contributions from polypharmacy with multiple centrally acting medicines and hypoglycemic medications.  Husband in the room reports that she had a urinary tract infection at that time however it appears that her urinalysis was negative for infection at this time.  ED Course: Presented as above hemodynamically stable and afebrile.  Lab work significant for creatinine of 1.62, hemoglobin of 10.9, and UA with leukocytes, many bacteria but no pyuria and some squamous epithelial cells present.  CT of the head and chest x-ray showed no acute abnormalities.  She was given a dose of 1 g ceftriaxone   and 500 mL bolus before IMTS was paged for admission.  Past Medical History Hypertension Type 2 diabetes Peripheral neuropathy Lumbosacral spondylosis s/p spinal cord stimulator placement 06/22/2024 Chronic back pain not on chronic opiates Anxiety Insomnia Normocytic anemia Lower extremity venous insufficiency CKD stage IIIa  Meds:  Topiramate  50 mg twice  daily Cyclobenzaprine 10 mg nightly Pregabalin  100 mg 3 times daily Lorazepam  2 mg nightly as needed (noted that she takes this a few times a month) Zolpidem  5 mg nightly Benadryl 25 mg twice daily Oxycodone  5 mg every 4 hours as needed Methocarbamol  500 mg every 6 hours as needed Ozempic 1 mg weekly (last dose roughly 2 weeks ago) Pioglitazone  45 mg nightly Glipizide  2.5 mg daily Sitagliptin/metformin 50-1000 mg twice daily (might be holding with recent increase in creatinine) Furosemide 20 mg daily Vitamin B12 injections every 2 weeks Rosuvastatin  10 mg daily Propranolol  60 mg daily Amlodipine  5 mg daily Spironolactone 25 mg daily Prempro oral contraceptive (estrogen and medroxyprogesterone) Aspirin  81 mg daily Vitamin D  5000 units daily Zinc  50 mg daily Famotidine  20 mg nightly Ferrous sulfate  325 mg daily Magnesium  oxide 400 mg daily Nexium 20 mg daily Sucralfate 3 times daily with meals and before bed Phentermine  37.5 mg daily  Past Surgical History Past Surgical History:  Procedure Laterality Date   Basal cell cancer  removal     colonoscopy  2010   EXCISION/RELEASE BURSA HIP Right 12/23/2022   Procedure: OPEN EXCISION OF RIGHT TROCHANTERIC BURSA;  Surgeon: Edie Norleen PARAS, MD;  Location: [facility] ORS;  Service: Orthopedics;  Laterality: Right;   EXCISIONAL HEMORRHOIDECTOMY  03/2012   IR ANGIO EXTERNAL CAROTID SEL EXT CAROTID BILAT MOD SED  09/04/2021   IR ANGIO INTRA EXTRACRAN SEL INTERNAL CAROTID BILAT MOD SED  09/04/2021   IR ANGIO VERTEBRAL SEL SUBCLAVIAN INNOMINATE UNI L MOD SED  09/04/2021   IR ANGIO VERTEBRAL SEL VERTEBRAL UNI R MOD SED  09/04/2021   IR US  GUIDE VASC ACCESS RIGHT  09/04/2021   right and left eye cataract surgery  2006, 2008   right in 2006 and left 2008   THORACIC LAMINECTOMY FOR SPINAL CORD STIMULATOR N/A 06/22/2024   Procedure: THORACIC LAMINECTOMY FOR SPINAL CORD STIMULATOR;  Surgeon: Clois Fret, MD;  Location: [facility] ORS;  Service: Neurosurgery;   Laterality: N/A;  THORACIC LAMINECTOMY FOR SPINAL CORD STIMULATOR PLACEMENT   TUBAL LIGATION  1995    Social:  Lives at home with husband and is independent in ADLs and IADLs.  Before recent back surgery was walking with a cane when outside of the house but independently went inside the house. PCP: Auston Reyes BIRCH, MD at [facility] Substances: Denies current or prior use of tobacco, alcohol , or drugs.  Family History:  Family History  Problem Relation Age of Onset   Heart failure Mother    COPD Mother    Hypertension Mother    Arthritis Mother    Cancer Father        prostate   Parkinson's disease Father    Colon cancer Father    Cancer Maternal Aunt        colon   Cancer Paternal Grandmother        stomach   Diabetes Brother    Diabetes Brother     Allergies: Allergies as of 06/26/2024 - Review Complete 06/26/2024  Allergen Reaction Noted   Lisinopril Hives, Itching, Swelling, and Rash 03/12/2015    Review of Systems: A complete ROS was negative except as per HPI.  OBJECTIVE:   Physical Exam: Blood pressure (!) 141/67, pulse 72, temperature 98.5 F (36.9 C), resp. rate 13, last menstrual period 03/26/2012, SpO2 94%.  Constitutional: Anxious appearing elderly female laying in bed. In no acute distress. HENT: Normocephalic, atraumatic,  Eyes: Sclera non-icteric, PERRL, EOM intact Cardio:Regular rate and rhythm. 2+ bilateral radial and dorsalis pedis  pulses. Pulm:Clear to auscultation bilaterally. Normal work of breathing on room air. Abdomen: Soft, non-tender, non-distended, positive bowel sounds. MSK: TED hose in place and trace lower extremity edema just above them at the knee.  New, clean, intact, and dry incisions in the thoracic spine with overlying foam lattice dressing Skin:Warm and dry.  Neuro: *MS: A&O x4. Follows multi-step commands.  *Speech: no dysarthria or aphasia, able to name and repeat. *CN:    I: Deferred   II,III: PERRLA, VFF by  confrontation, optic discs not visualized 2/2 pupillary constriction   III,IV,VI: EOMI w/o nystagmus, no ptosis   V: Sensation intact from V1 to V3 to LT   VII: Eyelid closure was full.  Smile symmetric.   VIII: Hearing intact to voice   IX,X: Voice normal, palate elevates symmetrically    XI: SCM/trap 5/5 bilat   XII: Tongue protrudes midline, no atrophy or fasciculations  *Motor:   Normal bulk.  No tremor, rigidity or bradykinesia. No pronator drift.   Strength: Dlt Bic Tri WE WrF FgS Gr HF KnF KnE PlF DoF    Left 5 5 5 5 5 5 5 5 5 5 5 5     Right 5 5 5 5 5 5 5 5 5 5 5 5    *Sensory: Mild sensory deficit to light touch bilaterally in the lower extremities primarily in the feet.  Otherwise intact to light touch, pinprick, temperature vibration throughout. Symmetric. Propioception intact bilat.  No double-simultaneous extinction.  *Coordination:  Finger-to-nose, heel-to-shin (limited by lower back pain exacerbated with hip flexion), rapid alternating motions were intact. *Reflexes:  2+ and symmetric throughout without clonus; toes down-going bilat *Gait: deferred   Labs: CBC    Component Value Date/Time   WBC 8.4 06/26/2024 0943   RBC 3.54 (L) 06/26/2024 0943   HGB 10.9 (L) 06/26/2024 0943   HGB 11.4 09/10/2020 1100   HCT 33.7 (L) 06/26/2024 0943   HCT 33.8 (L) 09/10/2020 1100   PLT 195 06/26/2024 0943   PLT 268 09/10/2020 1100   MCV 95.2 06/26/2024 0943   MCV 89 09/10/2020 1100   MCH 30.8 06/26/2024 0943   MCHC 32.3 06/26/2024 0943   RDW 14.1 06/26/2024 0943   RDW 13.2 09/10/2020 1100   LYMPHSABS 1.5 06/26/2024 0943   LYMPHSABS 2.1 09/10/2020 1100   MONOABS 0.5 06/26/2024 0943   EOSABS 0.6 (H) 06/26/2024 0943   EOSABS 0.2 09/10/2020 1100   BASOSABS 0.0 06/26/2024 0943   BASOSABS 0.0 09/10/2020 1100     CMP     Component Value Date/Time   NA 137 06/26/2024 0943   NA 139 09/10/2020 1100   K 4.1 06/26/2024 0943   CL 105 06/26/2024 0943   CO2 23 06/26/2024 0943    GLUCOSE 145 (H) 06/26/2024 0943   BUN 29 (H) 06/26/2024 0943   BUN 20 09/10/2020 1100   CREATININE 1.62 (H) 06/26/2024 0943   CALCIUM  9.2 06/26/2024 0943   PROT 5.8 (L) 06/26/2024 0943   PROT 6.4 09/10/2020 1100   ALBUMIN 2.8 (L) 06/26/2024 0943   ALBUMIN 4.2 09/10/2020 1100   AST 20 06/26/2024 0943   ALT 15 06/26/2024 0943  ALKPHOS 64 06/26/2024 0943   BILITOT 0.8 06/26/2024 0943   BILITOT 0.4 09/10/2020 1100   GFRNONAA 34 (L) 06/26/2024 0943   GFRAA 67 09/10/2020 1100    Imaging: CT Head Wo Contrast Result Date: 06/26/2024 CLINICAL DATA:  Mental status change, unknown cause. EXAM: CT HEAD WITHOUT CONTRAST TECHNIQUE: Contiguous axial images were obtained from the base of the skull through the vertex without intravenous contrast. RADIATION DOSE REDUCTION: This exam was performed according to the departmental dose-optimization program which includes automated exposure control, adjustment of the mA and/or kV according to patient size and/or use of iterative reconstruction technique. COMPARISON:  MRI brain from 08/21/2021. FINDINGS: Brain: No evidence of acute infarction, hemorrhage, hydrocephalus, extra-axial collection or mass lesion/mass effect. Ventricles are normal. Cerebral volume is age appropriate. Vascular: No hyperdense vessel or unexpected calcification. Skull: Normal. Negative for fracture or focal lesion. Sinuses/Orbits: No acute finding. Other: Visualized mastoid air cells are unremarkable. No mastoid effusion. IMPRESSION: *No acute intracranial abnormality. Electronically Signed   By: Ree Molt M.D.   On: 06/26/2024 10:37   DG Chest 1 View Result Date: 06/26/2024 CLINICAL DATA:  [id number] Confusion [id number]. EXAM: CHEST  1 VIEW COMPARISON:  04/03/2012. FINDINGS: Linear area of scarring/atelectasis noted overlying the left lateral costophrenic angle. Bilateral lung fields are otherwise clear. Bilateral costophrenic angles are clear. Normal cardio-mediastinal silhouette. No acute  osseous abnormalities. The soft tissues are within normal limits. Neurostimulator device noted overlying the right upper abdomen with its lead overlying the midthoracic spine region. IMPRESSION: No active disease. Electronically Signed   By: Ree Molt M.D.   On: 06/26/2024 10:35     EKG: personally reviewed my interpretation is normal sinus rhythm with low voltage. Consistent with prior EKG.  ASSESSMENT & PLAN:   Assessment & Plan by Problem: Principal Problem:   Acute encephalopathy Active Problems:   Essential hypertension   Diabetes mellitus with polyneuropathy (HCC)   Insomnia   Chronic painful diabetic neuropathy (HCC)   Spinal stenosis 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   Stage 3a chronic kidney disease (CKD) (HCC)   AKI (acute kidney injury) (HCC)   Visual hallucination   Melody Brooks is a 68 y.o. 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  Acute toxic metabolic encephalopathy Visual hallucinations Overall most consistent with polypharmacy and multiple centrally acting medications with the possibility of a concomitant urinary tract infection.  Compared to her previous episode with similar symptoms she does not have a severe AKI and her urinalysis appears contaminated with squamous epithelial cells.  She is still on a significant amount of centrally acting medications with the addition of 2 more in the past 4 days coinciding with some worsening of her symptoms.  She was significantly vitamin B12 deficient 3 years ago but last vitamin B12 level from 01/31/2024 was normal at 981.  TSH normal at 1.558 on 04/17/2024.  Metabolic panel and cell count  did not show signs of significant electrolyte abnormality, infection, or anemia.  She has a family history of Parkinson's disease but otherwise does not show any bradykinesia.  There would recommend outpatient follow-up for cognitive testing when back to baseline.  CT of the head without contrast did not show any significant abnormalities and most recent MRI of the  brain from 08/2021 showed mild generalized cerebral atrophy and a small area in the left frontoparietal white matter that could indicate chronic small vessel ischemia. - Admit for observation - Antibiotics as below - Repeat B12 and TSH - Thiamine  100 mg daily - Hold centrally acting medications including cyclobenzaprine, lorazepam , methocarbamol , oxycodone , pregabalin , and zolpidem   ?Urinary tract infection Patient has a history of multiple urinary tract infections most reticently Enterococcus faecalis and previously Klebsiella pneumoniae with 1 being resistant ceftriaxone , ampicillin, and nitrofurantoin and another being resistant to ciprofloxacin.  She does not have any symptoms of a urinary tract infection and although there is leukocyte esterase on UA there is no pyuria and there are significant amount of squamous epithelial cells.  She received a dose of ceftriaxone  in the ED.  Due to the lack of systemic signs of infection and questionable susceptibility of Enterococcus to cephalosporins along with the resistance patterns of her previous UTIs we will switch to Augmentin . - Augmentin  875-125 mg twice daily for 2 more days - Monitor for any fever or other signs of systemic infection  Lumbar spondylosis Chronic back pain Peripheral neuropathy No signs of increasing back pain or other worrisome signs for epidural abscess after recent spinal surgery.  Overlying skin looks to be healing well without significant irritation or inflammation.  As above we will prefer to hold a number of her centrally acting medications that can help treat her  pain so we will start with topical therapies and Tylenol  and then slowly add back her other medications possibly lower doses as tolerated. - Tylenol  1000 mg every 6 hours - Lidocaine  patches daily - Voltaren  gel 4 times daily - For muscle relaxer I would recommend adding back methocarbamol  before cyclobenzaprine and for pain would recommend pregabalin  starting at 50 mg 3 times daily over oxycodone   AKI on CKD stage IIIa versus progression of CKD Baseline creatinine appears to be between 1.3-1.6 and creatinine here is 1.62 which is slightly decreased from 06/13/2024 when it was 1.75.  No reported change in urinary habits including decreased urine output.  Urinalysis here without significant protein or blood.  She received 500 mL normal saline bolus in the ED and appears to have a good appetite and ability to orally hydrate after that. - Ins and outs - Hold MRA and avoid other nephrotoxic medications - Repeat BMP in the morning  T2DM A1c 5.9 on 03/08/2024.  She is not on insulin  but is on glipizide  however here she is not hypoglycemic.  She was also on metformin/sitagliptin but it appears she was told to hold this due to her increased creatinine this month.  Otherwise she has not taken her semaglutide in roughly 2 weeks after holding it 1 week before her surgery on 8/22. - Moderate SSI 3 times daily with meals and at bedtime  Hypertension Home medications include amlodipine , spironolactone, propranolol , and furosemide.  Mildly hypertensive here in the 140s systolic.  No signs of orthostatic issues at home.  Appears euvolemic on exam. - Continue amlodipine  5 mg daily and propranolol  60 mg daily  Insomnia As above we will hold her sleep aids including zolpidem  and lorazepam . - Melatonin 3 mg nightly  Obesity Patient is on combination of topiramate  and phentermine  as well as a GLP-1 agonist for diabetes.  Due to the risk of abruptly stopping topiramate  and phentermine  causing possible seizures,  mental depression, fatigue, and worsening mood disorder we will continue these medicines for now.  If positive other centrally acting medications does improve  her symptoms I would recommend considering a slow taper off topiramate  and phentermine  in the outpatient setting. - Continue topiramate  50 mg twice daily and phentermine  37.5 mg daily  Postmenopausal Female on hormone therapy She is prescribed Prempro which is conjugated estrogen and medroxyprogesterone pill that is usually used for severe vasomotor symptoms, moderate to severe vulvar/vaginal atrophy, and prevention of postmenopausal osteoporosis.  Unable to see any of her OB/GYN notes.  This medication is used primarily for vasomotor symptoms she could switch to paroxetine or another SSRI/SNRI which could alleviate the need for some of her other centrally acting medicines else in the hospital without the increased risk of VTE. - Hold Prempro while inpatient  Diet: Normal VTE: Enoxaparin  Code: Full  Dispo: Admit patient to Observation with expected length of stay less than 2 midnights.  Signed: Fairy Pool, DO Internal Medicine Resident, PGY-3 Please contact the on call pager at [phone] for any urgent or emergent needs. 2:05 PM 06/26/2024

## 2024-06-27 LAB — RPR: RPR Ser Ql: NONREACTIVE

## 2024-06-27 NOTE — Discharge Summary (Signed)
 Name: Melody Brooks MRN: [id number] DOB: September 09, 1956 68 y.o. PCP: Auston Reyes BIRCH, MD  Date of Admission: 06/26/2024  8:58 AM Date of Discharge: 06/27/2024  Attending Physician: Dr. Jone Dauphin  Discharge Diagnosis: Principal Problem:   Acute encephalopathy Active Problems:   Essential hypertension   Diabetes mellitus with polyneuropathy (HCC)   Insomnia   Chronic painful diabetic neuropathy (HCC)   Spinal stenosis 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   Stage 3a chronic kidney disease (CKD) (HCC)   AKI (acute kidney injury) (HCC)   Visual hallucination    Discharge Medications: Allergies as of 06/26/2024       Reactions   Lisinopril Hives, Itching, Swelling, Rash   Angioedema        Medication List     STOP taking these medications    cyclobenzaprine 10 MG tablet Commonly known as: FLEXERIL   diphenhydrAMINE 25 MG tablet Commonly known as: BENADRYL   doxycycline  100 MG tablet Commonly known as: VIBRA -TABS   LORazepam  2 MG tablet Commonly known as: ATIVAN    methocarbamol  500 MG tablet Commonly known as: ROBAXIN    oxyCODONE  5 MG immediate release tablet Commonly known as: Roxicodone    pregabalin  100 MG capsule Commonly known as: LYRICA    zolpidem  5 MG tablet Commonly known as: AMBIEN        TAKE these medications    acetaminophen  500 MG tablet Commonly known as: TYLENOL  Take 1,000 mg by mouth every 6 (six) hours as needed for moderate pain.   amLODipine  5 MG tablet Commonly known as: NORVASC  Take 5 mg by mouth daily.   aspirin  81 MG tablet Take 81 mg by mouth daily.   B-12 IJ Inject 1,000 Units as directed every 14 (fourteen) days.   Dialyvite Vitamin D  5000 125 MCG (5000 UT) capsule Generic drug: Cholecalciferol  Take 5,000 Units by mouth daily.   EpiPen  2-Pak 0.3 MG/0.3ML Soaj injection Generic drug: EPINEPHrine  Inject 0.3 mg into the muscle as needed for anaphylaxis.   esomeprazole 20 MG capsule Commonly known as:  NEXIUM Take 40 mg by mouth daily at 12 noon.   estrogen (conjugated)-medroxyprogesterone 0.3-1.5 MG tablet Commonly known as: PREMPRO Take 1 tablet by mouth daily.   famotidine  20 MG tablet Commonly known as: PEPCID  Take 20 mg by mouth at bedtime.   ferrous sulfate  325 (65 FE) MG tablet Take 325 mg by mouth at bedtime.   fluticasone-salmeterol 250-50 MCG/ACT Aepb Commonly known as: ADVAIR Inhale 1 puff into the lungs 2 (two) times daily as needed (chest congestion).   furosemide 20 MG tablet Commonly known as: LASIX Take 20 mg by mouth daily.   glipiZIDE  2.5 MG 24 hr tablet Commonly known as: GLUCOTROL  XL TAKE 1 TABLET BY MOUTH  DAILY WITH BREAKFAST   Janumet  50-1000 MG tablet Generic drug: sitaGLIPtin-metformin TAKE 1 TABLET BY MOUTH  TWICE DAILY What changed: when to take this   magnesium  oxide 400 (240 Mg) MG tablet Commonly known as: MAG-OX Take 400 mg by mouth daily.   OZEMPIC (0.25 OR 0.5 MG/DOSE) [place] Inject 1 mg into the skin once a week. Mondays   phentermine  37.5 MG tablet Commonly known as: ADIPEX-P  Take 37.5 mg by mouth every morning.   pioglitazone  45 MG tablet Commonly known as: ACTOS  TAKE 1 TABLET BY MOUTH AT  BEDTIME   propranolol  ER 60 MG 24 hr capsule Commonly known as: INDERAL  LA Take 60 mg by mouth daily.   rosuvastatin  10 MG tablet Commonly known as: CRESTOR  Take 10 mg by mouth  daily.   spironolactone 25 MG tablet Commonly known as: ALDACTONE Take 25 mg by mouth daily.   topiramate  50 MG tablet Commonly known as: TOPAMAX  Take 50 mg by mouth 2 (two) times daily.   zinc  gluconate 50 MG tablet Take 50 mg by mouth daily.        Disposition and follow-up:   Ms.Koreena A Dowdle was discharged from [facility] in Stable condition.  At the hospital follow up visit please address:  1.  Follow-up:  a.  Improvement in frequency of visual hallucinations.  Consider neuropsych testing for progressive neurologic disease in  conjunction with tapering off or discontinuing a lot of her centrally acting medications.    b.  Reevaluate for any UTI symptoms and test/treat as indicated.  Follow-up Appointments:   Hospital Course by problem list: Melody Brooks is a 68 y.o. 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   Acute toxic metabolic encephalopathy Visual hallucinations Presented with 2 weeks of worsening confusion and visual hallucinations.  Also had spinal cord stimulator placed on 06/22/2024 but no significant change in symptoms worse or better after this.  Overall most consistent with multiple centrally acting medications with the possibility of a concomitant urinary tract infection.  Compared to her previous episode with similar symptoms she does not have a severe AKI and her urinalysis appears contaminated with squamous epithelial cells.  She is still on a significant amount of centrally acting medications with the addition of 2 more in the past 4 days coinciding with some worsening of her symptoms.  She was significantly vitamin B12 deficient 3 years ago but last vitamin B12 level from 01/31/2024 was normal at 981.  TSH normal at 1.558 on 04/17/2024.  Metabolic panel and cell count did not show signs of significant electrolyte abnormality, infection, or anemia.  She has a family history of Parkinson's disease but does not show any bradykinesia. CT of the head without contrast did not show any significant abnormalities and most recent MRI of the brain from 08/2021 showed mild generalized cerebral atrophy and a small area in the left frontoparietal white matter that could indicate chronic small vessel  ischemia.  She was admitted for further evaluation and trial off several of her centrally acting medications.  Vitamin B12 checked and returned supratherapeutic at over 4000 and TSH was 2.291.  A few hours after my evaluation the patient wanted to go home if she was not can to get IV antibiotics and due to lack of urinary tract symptoms and no signs of systemic infection with plan to discontinue antibiotics.  She received extensive counseling on the interaction of all of her centrally acting medications and was instructed to stop these medications until she talk to her primary care provider and then slowly add them back as indicated.  Recommended to have close PCP follow-up and given strict return precautions before discharge   ?Urinary tract infection Patient has a history of multiple urinary tract infections most reticently Enterococcus faecalis and previously Klebsiella pneumoniae with 1 being resistant ceftriaxone , ampicillin, and nitrofurantoin and another being resistant to ciprofloxacin.  She does not have any symptoms of a urinary tract infection and although there is leukocyte esterase on UA there is no pyuria and there are  significant amount of squamous epithelial cells.  She received a dose of ceftriaxone  in the ED.  Due to the lack of systemic signs of infection and questionable susceptibility of Enterococcus to cephalosporins along with the resistance patterns of her previous UTIs we thought to switch to Augmentin  if she showed signs of systemic infection or symptomatic UTI but due to the lack of these we decided to discontinue antibiotics.   Lumbar spondylosis Chronic back pain Peripheral neuropathy No signs of increasing back pain or other worrisome signs for epidural abscess after recent spinal surgery.  Overlying skin looks to be healing well without significant irritation or inflammation.  We recommended that she hold her centrally acting medications that include oxycodone , cyclobenzaprine,  methocarbamol , and pregabalin .  Recommend that she uses Tylenol  and topical therapies and then works with her primary care provider for safely adding pain medications and muscle relaxers back.     CKD stage IIIa  Baseline creatinine appears to be between 1.3-1.6 and creatinine here is 1.62 which is slightly decreased from 06/13/2024 when it was 1.75.  No reported change in urinary habits including decreased urine output.  Urinalysis here without significant protein or blood.  She received 500 mL normal saline bolus in the ED and appears to have a good appetite and ability to orally hydrate after that.   T2DM A1c 5.9 on 03/08/2024.  She is not on insulin  but is on glipizide  however here she is not hypoglycemic.  She was also on metformin/sitagliptin but it appears she was told to hold this due to her increased creatinine this month.  Otherwise she has not taken her semaglutide in roughly 2 weeks after holding it 1 week before her surgery on 8/22.  Resume current medications on discharge.   Hypertension Home medications include amlodipine , spironolactone, propranolol , and furosemide.  Mildly hypertensive here in the 140s systolic.  No signs of orthostatic issues at home.  Appears euvolemic on exam.  Home medications resumed on discharge.   Insomnia As above we will hold her sleep aids including zolpidem  and lorazepam .  Recommend melatonin 3 mg nightly.   Obesity Patient is on combination of topiramate  and phentermine  as well as a GLP-1 agonist for diabetes.  Due to the risk of abruptly stopping topiramate  and phentermine  causing possible seizures, mental depression, fatigue, and worsening mood disorder we will continue these medicines for now. I would recommend considering a slow taper off topiramate  and phentermine  in the outpatient setting.   Postmenopausal Female on hormone therapy She is prescribed Prempro which is conjugated estrogen and medroxyprogesterone pill that is usually used for severe  vasomotor symptoms, moderate to severe vulvar/vaginal atrophy, and prevention of postmenopausal osteoporosis.  Unable to see any of her OB/GYN notes.  If this medication is used primarily for vasomotor symptoms she could switch to paroxetine or another SSRI/SNRI which could alleviate the need for some of her other centrally acting medicines without the increased risk of VTE.   Discharge Subjective: See H&P.  Afterwards patient reported that she wanted to go home.  Discharge Exam:   BP (!) 117/47 (BP Location: Right Arm)   Pulse 75   Temp (!) 97.5 F (36.4 C) (Oral)   Resp 18   LMP 03/26/2012 (Approximate)   SpO2 100%  Constitutional: Anxious appearing elderly female laying in bed. In no acute distress. HENT: Normocephalic, atraumatic,  Eyes: Sclera non-icteric, PERRL, EOM intact Cardio:Regular rate and rhythm. 2+ bilateral radial and dorsalis pedis  pulses. Pulm:Clear to auscultation bilaterally. Normal work of  breathing on room air. Abdomen: Soft, non-tender, non-distended, positive bowel sounds. MSK: TED hose in place and trace lower extremity edema just above them at the knee.  New, clean, intact, and dry incisions in the thoracic spine with overlying foam lattice dressing Skin:Warm and dry.   Neuro: *MS: A&O x4. Follows multi-step commands.  *Speech: no dysarthria or aphasia, able to name and repeat. *CN:    I: Deferred   II,III: PERRLA, VFF by confrontation, optic discs not visualized 2/2 pupillary constriction   III,IV,VI: EOMI w/o nystagmus, no ptosis   V: Sensation intact from V1 to V3 to LT   VII: Eyelid closure was full.  Smile symmetric.   VIII: Hearing intact to voice   IX,X: Voice normal, palate elevates symmetrically    XI: SCM/trap 5/5 bilat   XII: Tongue protrudes midline, no atrophy or fasciculations  *Motor:   Normal bulk.  No tremor, rigidity or bradykinesia. No pronator drift.    Strength: Dlt Bic Tri WE WrF FgS Gr HF KnF KnE PlF DoF    Left 5 5 5 5 5 5 5 5 5  5 5 5     Right 5 5 5 5 5 5 5 5 5 5 5 5     *Sensory: Mild sensory deficit to light touch bilaterally in the lower extremities primarily in the feet.  Otherwise intact to light touch, pinprick, temperature vibration throughout. Symmetric. Propioception intact bilat.  No double-simultaneous extinction.  *Coordination:  Finger-to-nose, heel-to-shin (limited by lower back pain exacerbated with hip flexion), rapid alternating motions were intact. *Reflexes:  2+ and symmetric throughout without clonus; toes down-going bilat *Gait: deferred  Pertinent Labs, Studies, and Procedures:     Latest Ref Rng & Units 06/26/2024    9:43 AM 06/13/2024    1:59 PM 09/04/2021    9:14 AM  CBC  WBC 4.0 - 10.5 K/uL 8.4  6.3  7.9   Hemoglobin 12.0 - 15.0 g/dL 89.0  88.6  88.6   Hematocrit 36.0 - 46.0 % 33.7  35.0  35.0   Platelets 150 - 400 K/uL 195  228  231        Latest Ref Rng & Units 06/26/2024    9:43 AM 06/13/2024    1:59 PM 09/04/2021    9:14 AM  CMP  Glucose 70 - 99 mg/dL 854  883  860   BUN 8 - 23 mg/dL 29  28  20    Creatinine 0.44 - 1.00 mg/dL 8.37  8.24  8.79   Sodium 135 - 145 mmol/L 137  141  139   Potassium 3.5 - 5.1 mmol/L 4.1  4.4  4.7   Chloride 98 - 111 mmol/L 105  108  104   CO2 22 - 32 mmol/L 23  27  28    Calcium  8.9 - 10.3 mg/dL 9.2  9.6  9.3   Total Protein 6.5 - 8.1 g/dL 5.8     Total Bilirubin 0.0 - 1.2 mg/dL 0.8     Alkaline Phos 38 - 126 U/L 64     AST 15 - 41 U/L 20     ALT 0 - 44 U/L 15       CT Head Wo Contrast Result Date: 06/26/2024 CLINICAL DATA:  Mental status change, unknown cause. EXAM: CT HEAD WITHOUT CONTRAST TECHNIQUE: Contiguous axial images were obtained from the base of the skull through the vertex without intravenous contrast. RADIATION DOSE REDUCTION: This exam was performed according to the departmental dose-optimization program which includes automated  exposure control, adjustment of the mA and/or kV according to patient size and/or use of iterative  reconstruction technique. COMPARISON:  MRI brain from 08/21/2021. FINDINGS: Brain: No evidence of acute infarction, hemorrhage, hydrocephalus, extra-axial collection or mass lesion/mass effect. Ventricles are normal. Cerebral volume is age appropriate. Vascular: No hyperdense vessel or unexpected calcification. Skull: Normal. Negative for fracture or focal lesion. Sinuses/Orbits: No acute finding. Other: Visualized mastoid air cells are unremarkable. No mastoid effusion. IMPRESSION: *No acute intracranial abnormality. Electronically Signed   By: Ree Molt M.D.   On: 06/26/2024 10:37   DG Chest 1 View Result Date: 06/26/2024 CLINICAL DATA:  [id number] Confusion [id number]. EXAM: CHEST  1 VIEW COMPARISON:  04/03/2012. FINDINGS: Linear area of scarring/atelectasis noted overlying the left lateral costophrenic angle. Bilateral lung fields are otherwise clear. Bilateral costophrenic angles are clear. Normal cardio-mediastinal silhouette. No acute osseous abnormalities. The soft tissues are within normal limits. Neurostimulator device noted overlying the right upper abdomen with its lead overlying the midthoracic spine region. IMPRESSION: No active disease. Electronically Signed   By: Ree Molt M.D.   On: 06/26/2024 10:35     Discharge Instructions:   Discharge Instructions      To Ms. Asberry DELENA Molt or their caretakers,  They were admitted to [facility] on 06/26/2024 for evaluation and treatment of:  Principal Problem:   Acute encephalopathy Active Problems:   Essential hypertension   Diabetes mellitus with polyneuropathy (HCC)   Insomnia   Chronic painful diabetic neuropathy (HCC)   Spinal stenosis 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   Stage 3a chronic kidney disease (CKD) (HCC)   AKI (acute kidney injury) (HCC)   Visual hallucination  Resolved Problems:   * No resolved hospital problems. *  The evaluation suggested confusion due to unsafe medications. They  were observed for safety for a several-hour period.  They were discharged from the hospital on 06/26/24. I recommend the following after leaving the hospital:   Review your medication list for medications that were stopped. Talk to your doctor about these medications.  Medications Discontinued During This Encounter  Medication Reason   zinc  gluconate tablet 50 mg P&T Policy: Herbal Product    oxyCODONE  (Oxy IR/ROXICODONE ) immediate release tablet 5 mg    pregabalin  (LYRICA ) 100 MG capsule Stop Taking at Discharge   diphenhydrAMINE (BENADRYL) 25 MG tablet Stop Taking at Discharge   cyclobenzaprine (FLEXERIL) 10 MG tablet Stop Taking at Discharge   LORazepam  (ATIVAN ) 2 MG tablet Stop Taking at Discharge   zolpidem  (AMBIEN ) 5 MG tablet Stop Taking at Discharge   doxycycline  (VIBRA -TABS) 100 MG tablet Stop Taking at Discharge   oxyCODONE  (ROXICODONE ) 5 MG immediate release tablet Stop Taking at Discharge   methocarbamol  (ROBAXIN ) 500 MG tablet Stop Taking at Discharge   For questions about your care plan, until you are able to see your primary doctor: Call [phone]. Dial [phone] for the operator. Ask for the internal medicine resident on call.  Ozell Kung MD 06/26/2024, 5:42 PM      Signed: Fairy Pool, DO Internal Medicine Resident, PGY-3 Please contact the on call pager at [phone] for any urgent or emergent needs. 2:47 PM 06/27/2024

## 2024-06-28 LAB — URINE CULTURE: Culture: >=100000 — AB

## 2024-07-02 NOTE — Progress Notes (Unsigned)
   REFERRING PHYSICIAN:  Auston Reyes BIRCH, Md [address] [facility] [place],  [place] [address]  DOS: 06/22/24  [employer] SCS placement  HISTORY OF PRESENT ILLNESS: Melody Brooks is approximately 2 weeks status post above surgery. Was given robaxin  and oxycodone  on discharge from the hospital.   She was admitted on 06/26/24 for acute encephalopathy. They advised her to stop a lot of her medications including oxycodone  and robaxin . She has restarted them and is not having any side effects. She is taking 1/2-1 oxycodone  a day.   Overall, she is doing well. She has some right sided rib pain/pressure. No leg pain. LBP is same as it was prior to procedure.    PHYSICAL EXAMINATION:  General: Patient is well developed, well nourished, calm, collected, and in no apparent distress.   NEUROLOGICAL:  General: In no acute distress.   Awake, alert, oriented to person, place, and time.  Pupils equal round and reactive to light.  Facial tone is symmetric.     Strength:           Side Iliopsoas Quads Hamstring PF DF EHL  R 5 5 5 5 5 5   L 5 5 5 5 5 5    Incisions c/d/i   ROS (Neurologic):  Negative except as noted above  IMAGING: Nothing new to review.   ASSESSMENT/PLAN:  Melody Brooks is doing well s/p above surgery. Treatment options reviewed with patient and following plan made:   - I have advised the patient to lift up to 10 pounds until 6 weeks after surgery (follow up with Dr. Clois).  - Reviewed wound care.  - No bending, twisting, or lifting.  - Message to PCP [facility])- okay to restart low dose prn oxycodone ? This was stopped after admission for acute encephalopathy. Will let her now when I hear back from him.   - [employer] reps met with her today for programming. 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  - Follow up as scheduled in 4 weeks and prn.   Advised to contact the office if any questions or concerns  arise.  Glade Boys PA-C Department of neurosurgery

## 2024-07-04 ENCOUNTER — Encounter: Payer: Self-pay | Admitting: Orthopedic Surgery

## 2024-07-04 ENCOUNTER — Ambulatory Visit (INDEPENDENT_AMBULATORY_CARE_PROVIDER_SITE_OTHER): Admitting: Orthopedic Surgery

## 2024-07-04 VITALS — BP 112/62 | Temp 98.0°F | Ht 68.0 in | Wt 240.0 lb

## 2024-07-04 DIAGNOSIS — G894 Chronic pain syndrome: Secondary | ICD-10-CM

## 2024-07-04 DIAGNOSIS — Z9689 Presence of other specified functional implants: Secondary | ICD-10-CM

## 2024-07-05 ENCOUNTER — Telehealth: Payer: Self-pay | Admitting: Orthopedic Surgery

## 2024-07-05 DIAGNOSIS — Z9689 Presence of other specified functional implants: Secondary | ICD-10-CM

## 2024-07-05 DIAGNOSIS — G894 Chronic pain syndrome: Secondary | ICD-10-CM

## 2024-07-05 NOTE — Telephone Encounter (Signed)
 Patient called stating she has 1 pill left. She knows we are waiting to her from her PCP as stated.  Message to PCP [facility])- okay to restart low dose prn oxycodone ? This was stopped after admission for acute encephalopathy. Will let her now when I hear back from him.     I informed the patient once we hear something back, someone will reach out to her.

## 2024-07-05 NOTE — Telephone Encounter (Signed)
 I would tell her she can try reaching out to Dr. Auston as well about this.

## 2024-07-05 NOTE — Telephone Encounter (Signed)
 Patient notified and expressed understanding.

## 2024-07-06 ENCOUNTER — Ambulatory Visit (INDEPENDENT_AMBULATORY_CARE_PROVIDER_SITE_OTHER): Admitting: Vascular Surgery

## 2024-07-06 ENCOUNTER — Encounter (INDEPENDENT_AMBULATORY_CARE_PROVIDER_SITE_OTHER): Payer: Self-pay | Admitting: Vascular Surgery

## 2024-07-06 ENCOUNTER — Ambulatory Visit (INDEPENDENT_AMBULATORY_CARE_PROVIDER_SITE_OTHER)

## 2024-07-06 VITALS — BP 108/62 | HR 64 | Resp 18

## 2024-07-06 DIAGNOSIS — E1142 Type 2 diabetes mellitus with diabetic polyneuropathy: Secondary | ICD-10-CM

## 2024-07-06 DIAGNOSIS — L97512 Non-pressure chronic ulcer of other part of right foot with fat layer exposed: Secondary | ICD-10-CM | POA: Diagnosis not present

## 2024-07-06 DIAGNOSIS — I999 Unspecified disorder of circulatory system: Secondary | ICD-10-CM | POA: Diagnosis not present

## 2024-07-06 DIAGNOSIS — I6523 Occlusion and stenosis of bilateral carotid arteries: Secondary | ICD-10-CM

## 2024-07-06 DIAGNOSIS — L97511 Non-pressure chronic ulcer of other part of right foot limited to breakdown of skin: Secondary | ICD-10-CM

## 2024-07-06 DIAGNOSIS — I10 Essential (primary) hypertension: Secondary | ICD-10-CM | POA: Diagnosis not present

## 2024-07-06 MED ORDER — OXYCODONE HCL 5 MG PO TABS: 5.0000 mg | ORAL_TABLET | Freq: Two times a day (BID) | ORAL | 0 refills | Status: DC | PRN

## 2024-07-06 NOTE — Assessment & Plan Note (Signed)
 blood pressure control important in reducing the progression of atherosclerotic disease. On appropriate oral medications.

## 2024-07-06 NOTE — Assessment & Plan Note (Signed)
 We performed ABIs today which were normal at 1.09 on the right and 1.15 on the left with triphasic waveforms and normal digital pressures and waveforms bilaterally.  We discussed today with she and her husband that her perfusion is excellent at the macrovascular level.  She may have some microvascular disease from her diabetes, but there is not much we can do to assist with that other thing keep her sugars under good control.  She is on aspirin  which is appropriate.  No further vascular intervention or workup is planned with her normal perfusion at this time.  I will defer to her podiatrist in terms of local wound care of the right foot.

## 2024-07-06 NOTE — Assessment & Plan Note (Signed)
 blood glucose control important in reducing the progression of atherosclerotic disease. Also, involved in wound healing. On appropriate medications.

## 2024-07-06 NOTE — Telephone Encounter (Signed)
 Spoke with pharmacist at [facility] on [address], they do not have Oxycodone  5 mg in stock but [facility] [address] [facility]. RX cancelled at [facility] location. Patient advised and is ok with [facility] if [facility]. Please re send to that location, thank you.

## 2024-07-06 NOTE — Telephone Encounter (Signed)
 Patient called back to let our office know that she reached out to Dr. Auston office and was told by his nurse that he had not received a message from our office but that he did not feel comfortable refilling the medication since Dr. Clois initially prescribed. She states that she is out of the medication and she has been cutting it in half when she takes it. She is requesting [facility].

## 2024-07-06 NOTE — Telephone Encounter (Signed)
 Michelle from Dr. Auston office called to obtain clarification for the patient's request. She states that Dr. Auston would not refill the patient's Oxycodone , but he is fine with her resuming the low dosage.

## 2024-07-06 NOTE — Progress Notes (Signed)
 MRN : [id number]  Melody Brooks is a 68 y.o. (1956/02/25) female who presents with chief complaint of  Chief Complaint  Patient presents with   Follow-up    Ref Tobie consult abi for rt foot ulcer  .  History of Present Illness: Patient returns today in follow up on referral from her podiatrist for a nonhealing ulceration on the right lateral foot.  We have been chronically following her for mild carotid disease.  This has been present now for months.  She also has a small ulceration on the left posterior calf.  She has longstanding diabetes but her sugars currently reasonably well-controlled and her hemoglobin A1c was only 5.3 at last check.  She has been following with podiatry who was appropriately concerned about her circulation given her nonhealing wound and longstanding diabetes.  We performed ABIs today which were normal at 1.09 on the right and 1.15 on the left with triphasic waveforms and normal digital pressures and waveforms bilaterally.  Current Outpatient Medications  Medication Sig Dispense Refill   acetaminophen  (TYLENOL ) 500 MG tablet Take 1,000 mg by mouth every 6 (six) hours as needed for moderate pain.     amLODipine  (NORVASC ) 5 MG tablet Take 5 mg by mouth daily.     aspirin  81 MG tablet Take 81 mg by mouth daily.     Cholecalciferol  (DIALYVITE VITAMIN D  5000) 125 MCG (5000 UT) capsule Take 5,000 Units by mouth daily.     Cyanocobalamin  (B-12 IJ) Inject 1,000 Units as directed every 14 (fourteen) days.     EPIPEN  2-PAK 0.3 MG/0.3ML SOAJ injection Inject 0.3 mg into the muscle as needed for anaphylaxis.     esomeprazole (NEXIUM) 20 MG capsule Take 40 mg by mouth daily at 12 noon.     estrogen, conjugated,-medroxyprogesterone (PREMPRO) 0.3-1.5 MG tablet Take 1 tablet by mouth daily.     famotidine  (PEPCID ) 20 MG tablet Take 20 mg by mouth at bedtime.     ferrous sulfate  325 (65 FE) MG tablet Take 325 mg by mouth at bedtime.     fluticasone -salmeterol (ADVAIR) 250-50  MCG/ACT AEPB Inhale 1 puff into the lungs 2 (two) times daily as needed (chest congestion).     furosemide  (LASIX ) 20 MG tablet Take 20 mg by mouth daily.     glipiZIDE  (GLUCOTROL  XL) 2.5 MG 24 hr tablet TAKE 1 TABLET BY MOUTH  DAILY WITH BREAKFAST 90 tablet 2   JANUMET  50-1000 MG tablet TAKE 1 TABLET BY MOUTH  TWICE DAILY (Patient taking differently: Take 1 tablet by mouth daily.) 180 tablet 2   magnesium  oxide (MAG-OX) 400 (240 Mg) MG tablet Take 400 mg by mouth daily.     phentermine  (ADIPEX-P ) 37.5 MG tablet Take 37.5 mg by mouth every morning.     pioglitazone  (ACTOS ) 45 MG tablet TAKE 1 TABLET BY MOUTH AT  BEDTIME 60 tablet 5   propranolol  ER (INDERAL  LA) 60 MG 24 hr capsule Take 60 mg by mouth daily.     rosuvastatin  (CRESTOR ) 10 MG tablet Take 10 mg by mouth daily.     Semaglutide (OZEMPIC, 0.25 OR 0.5 MG/DOSE, [place]) Inject 1 mg into the skin once a week. Mondays     spironolactone  (ALDACTONE ) 25 MG tablet Take 25 mg by mouth daily.     topiramate  (TOPAMAX ) 50 MG tablet Take 50 mg by mouth 2 (two) times daily.     zinc  gluconate 50 MG tablet Take 50 mg by mouth daily.     No  current facility-administered medications for this visit.    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 pt stated on around October of 2022   Angioedema    Arterial fibromuscular dysplasia (HCC) 07/08/2015   Arthritis    Cancer (HCC)    Carotid stenosis 12/16/2017   Chronic kidney disease    Chronic pain syndrome 03/20/2024   Chronic urticaria 09/10/2019   Constipation    Diabetes mellitus with polyneuropathy (HCC) 07/17/2003   Fatigue    Fibromuscular dysplasia (HCC)    Generalized headaches    GERD (gastroesophageal reflux disease)    Hemorrhoids    Hiatal hernia    Hypertension    Idiopathic scoliosis 07/30/2002   Lumbar facet arthropathy 03/20/2024   Lumbosacral radiculitis 12/11/2014   Neuropathy    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   a.) presurgical PCR (+) 06/13/2024 prior to THORACIC LAMINECTOMY FOR SCS IMPLANTATION   Palpitations    Pneumonia    Rectal bleeding    Rectal pain    Spinal stenosis 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 03/20/2024    Past Surgical History:  Procedure Laterality Date   Basal cell cancer  removal     colonoscopy  2010   EXCISION/RELEASE BURSA HIP Right 12/23/2022   Procedure: OPEN EXCISION OF RIGHT TROCHANTERIC BURSA;  Surgeon: Edie Norleen PARAS, MD;  Location: [facility];  Service: Orthopedics;  Laterality: Right;   EXCISIONAL HEMORRHOIDECTOMY  03/2012   IR ANGIO EXTERNAL CAROTID SEL EXT CAROTID BILAT MOD SED  09/04/2021   IR ANGIO INTRA EXTRACRAN SEL INTERNAL CAROTID BILAT MOD SED  09/04/2021   IR ANGIO VERTEBRAL SEL SUBCLAVIAN INNOMINATE UNI L MOD SED  09/04/2021   IR ANGIO VERTEBRAL SEL VERTEBRAL UNI R MOD SED  09/04/2021   IR US  GUIDE VASC ACCESS RIGHT  09/04/2021   right and left eye cataract surgery  2006, 2008   right in 2006 and left 2008   THORACIC LAMINECTOMY FOR SPINAL CORD STIMULATOR N/A 06/22/2024   Procedure: THORACIC LAMINECTOMY FOR SPINAL CORD STIMULATOR;  Surgeon: Clois Fret, MD;  Location: [facility];  Service: Neurosurgery;  Laterality: N/A;  THORACIC LAMINECTOMY FOR SPINAL CORD STIMULATOR PLACEMENT   TUBAL LIGATION  1995     Social History   Tobacco Use   Smoking status: Never   Smokeless tobacco: Never   Tobacco comments:    tobacco use- no  Vaping Use   Vaping status: Never Used  Substance Use Topics   Alcohol  use: No   Drug use: No       Family History  Problem Relation Age of Onset   Heart failure Mother    COPD Mother    Hypertension Mother    Arthritis Mother    Cancer Father        prostate   Parkinson's disease Father    Colon cancer Father    Cancer Maternal Aunt        colon   Cancer Paternal Grandmother        stomach   Diabetes Brother    Diabetes Brother      Allergies  Allergen Reactions    Lisinopril Hives, Itching, Swelling and Rash    Angioedema     REVIEW OF SYSTEMS (Negative unless checked)  Constitutional: [] Weight loss  [] Fever  [] Chills Cardiac: [] Chest pain   [] Chest pressure   [] Palpitations   [] Shortness of breath when laying flat   []   Shortness of breath at rest   [] Shortness of breath with exertion. Vascular:  [] Pain in legs with walking   [] Pain in legs at rest   [] Pain in legs when laying flat   [] Claudication   [] Pain in feet when walking  [] Pain in feet at rest  [] Pain in feet when laying flat   [] History of DVT   [] Phlebitis   [] Swelling in legs   [] Varicose veins   [] Non-healing ulcers Pulmonary:   [] Uses home oxygen   [] Productive cough   [] Hemoptysis   [] Wheeze  [] COPD   [] Asthma Neurologic:  [] Dizziness  [] Blackouts   [] Seizures   [] History of stroke   [] History of TIA  [] Aphasia   [] Temporary blindness   [] Dysphagia   [] Weakness or numbness in arms   [] Weakness or numbness in legs Musculoskeletal:  [] Arthritis   [] Joint swelling   [x] Joint pain   [] Low back pain Hematologic:  [] Easy bruising  [] Easy bleeding   [] Hypercoagulable state   [x] Anemic   Gastrointestinal:  [] Blood in stool   [] Vomiting blood  [x] Gastroesophageal reflux/heartburn   [] Abdominal pain Genitourinary:  [] Chronic kidney disease   [] Difficult urination  [] Frequent urination  [] Burning with urination   [] Hematuria Skin:  [] Rashes   [x] Ulcers   [x] Wounds Psychological:  [] History of anxiety   []  History of major depression.  Physical Examination  BP 108/62   Pulse 64   Resp 18   LMP 03/26/2012 (Approximate)  Gen:  WD/WN, NAD Head: [place]/AT, No temporalis wasting. Ear/Nose/Throat: Hearing grossly intact, nares w/o erythema or drainage Eyes: Conjunctiva clear. Sclera non-icteric Neck: Supple.  Trachea midline Pulmonary:  Good air movement, no use of accessory muscles.  Cardiac: RRR, no JVD Vascular:  Vessel Right Left  Radial Palpable Palpable                          PT  Palpable Palpable  DP Palpable Palpable   Gastrointestinal: soft, non-tender/non-distended. No guarding/reflex.  Musculoskeletal: M/S 5/5 throughout.  Lower extremity wounds as above.  Some purplish discoloration is present in the toes on both sides, worse on the left than the right. Neurologic: Sensation grossly intact in extremities.  Symmetrical.  Speech is fluent.  Psychiatric: Judgment intact, Mood & affect appropriate for pt's clinical situation. Dermatologic: Small circular posterior left calf scab.  Right lateral foot has a small punctate scab with some surrounding induration.  No real erythema currently.  No drainage.      Labs Recent Results (from the past 2160 hours)  CBC     Status: Abnormal   Collection Time: 06/13/24  1:59 PM  Result Value Ref Range   WBC 6.3 4.0 - 10.5 K/uL   RBC 3.63 (L) 3.87 - 5.11 MIL/uL   Hemoglobin 11.3 (L) 12.0 - 15.0 g/dL   HCT 64.9 (L) 63.9 - 53.9 %   MCV 96.4 80.0 - 100.0 fL   MCH 31.1 26.0 - 34.0 pg   MCHC 32.3 30.0 - 36.0 g/dL   RDW 85.7 88.4 - 84.4 %   Platelets 228 150 - 400 K/uL   nRBC 0.0 0.0 - 0.2 %    Comment: Performed at [facility], [address] [place], [place] [address]  Basic metabolic panel     Status: Abnormal   Collection Time: 06/13/24  1:59 PM  Result Value Ref Range   Sodium 141 135 - 145 mmol/L   Potassium 4.4 3.5 - 5.1 mmol/L   Chloride 108 98 -  111 mmol/L   CO2 27 22 - 32 mmol/L   Glucose, Bld 116 (H) 70 - 99 mg/dL    Comment: Glucose reference range applies only to samples taken after fasting for at least 8 hours.   BUN 28 (H) 8 - 23 mg/dL   Creatinine, Ser 8.24 (H) 0.44 - 1.00 mg/dL   Calcium  9.6 8.9 - 10.3 mg/dL   GFR, Estimated 31 (L) >60 mL/min    Comment: (NOTE) Calculated using the CKD-EPI Creatinine Equation (2021)    Anion gap 6 5 - 15    Comment: Performed at [facility], [address] [place], [place] [address]  Type and screen [facility]      Status: None   Collection Time: 06/13/24  1:59 PM  Result Value Ref Range   ABO/RH(D) A POS    Antibody Screen NEG    Sample Expiration 06/27/2024,2359    Extend sample reason      NO TRANSFUSIONS OR PREGNANCY IN THE PAST 3 MONTHS Performed at [facility], [address], [place], [place] [address]   Surgical pcr screen     Status: Abnormal   Collection Time: 06/13/24  2:10 PM   Specimen: Nasal Mucosa; Nasal Swab  Result Value Ref Range   MRSA, PCR POSITIVE (A) NEGATIVE    Comment: RESULT CALLED TO, READ BACK BY AND VERIFIED WITH: NOTIFIED BRYAN GRAY 1027 06/14/24 HNM    Staphylococcus aureus POSITIVE (A) NEGATIVE    Comment: (NOTE) The Xpert SA Assay (FDA approved for NASAL specimens in patients 33 years of age and older), is one component of a comprehensive surveillance program. It is not intended to diagnose infection nor to guide or monitor treatment. Performed at [facility], [address] [place], [place] [address]   Urinalysis, Complete w Microscopic -Urine, Clean Catch     Status: Abnormal   Collection Time: 06/13/24  2:25 PM  Result Value Ref Range   Color, Urine STRAW (A) YELLOW   APPearance CLEAR (A) CLEAR   Specific Gravity, Urine 1.008 1.005 - 1.030   pH 6.0 5.0 - 8.0   Glucose, UA NEGATIVE NEGATIVE mg/dL   Hgb urine dipstick NEGATIVE NEGATIVE   Bilirubin Urine NEGATIVE NEGATIVE   Ketones, ur NEGATIVE NEGATIVE mg/dL   Protein, ur NEGATIVE NEGATIVE mg/dL   Nitrite NEGATIVE NEGATIVE   Leukocytes,Ua NEGATIVE NEGATIVE   RBC / HPF 0-5 0 - 5 RBC/hpf   WBC, UA 0-5 0 - 5 WBC/hpf   Bacteria, UA FEW (A) NONE SEEN   Squamous Epithelial / HPF 0-5 0 - 5 /HPF   Mucus PRESENT     Comment: Performed at [facility], [address] [place], [place] [address]  Glucose, capillary     Status: Abnormal   Collection Time: 06/22/24  6:18 AM  Result Value Ref Range   Glucose-Capillary 66 (L) 70 - 99 mg/dL    Comment: Glucose reference range  applies only to samples taken after fasting for at least 8 hours.  ABO/Rh     Status: None   Collection Time: 06/22/24  6:21 AM  Result Value Ref Range   ABO/RH(D)      A POS Performed at [facility], [address] [place], [place] [address]   Glucose, capillary     Status: Abnormal   Collection Time: 06/22/24  9:22 AM  Result Value Ref Range   Glucose-Capillary 153 (H) 70 - 99 mg/dL    Comment: Glucose reference  range applies only to samples taken after fasting for at least 8 hours.   Comment 1 Notify RN    Comment 2 Document in Chart   Comprehensive metabolic panel     Status: Abnormal   Collection Time: 06/26/24  9:43 AM  Result Value Ref Range   Sodium 137 135 - 145 mmol/L   Potassium 4.1 3.5 - 5.1 mmol/L   Chloride 105 98 - 111 mmol/L   CO2 23 22 - 32 mmol/L   Glucose, Bld 145 (H) 70 - 99 mg/dL    Comment: Glucose reference range applies only to samples taken after fasting for at least 8 hours.   BUN 29 (H) 8 - 23 mg/dL   Creatinine, Ser 8.37 (H) 0.44 - 1.00 mg/dL   Calcium  9.2 8.9 - 10.3 mg/dL   Total Protein 5.8 (L) 6.5 - 8.1 g/dL   Albumin 2.8 (L) 3.5 - 5.0 g/dL   AST 20 15 - 41 U/L   ALT 15 0 - 44 U/L   Alkaline Phosphatase 64 38 - 126 U/L   Total Bilirubin 0.8 0.0 - 1.2 mg/dL   GFR, Estimated 34 (L) >60 mL/min    Comment: (NOTE) Calculated using the CKD-EPI Creatinine Equation (2021)    Anion gap 9 5 - 15    Comment: Performed at [facility], [address], [place], [place] [address]  CBC with Differential     Status: Abnormal   Collection Time: 06/26/24  9:43 AM  Result Value Ref Range   WBC 8.4 4.0 - 10.5 K/uL   RBC 3.54 (L) 3.87 - 5.11 MIL/uL   Hemoglobin 10.9 (L) 12.0 - 15.0 g/dL   HCT 66.2 (L) 63.9 - 53.9 %   MCV 95.2 80.0 - 100.0 fL   MCH 30.8 26.0 - 34.0 pg   MCHC 32.3 30.0 - 36.0 g/dL   RDW 85.8 88.4 - 84.4 %   Platelets 195 150 - 400 K/uL   nRBC 0.0 0.0 - 0.2 %   Neutrophils Relative % 69 %   Neutro Abs 5.8 1.7 - 7.7 K/uL    Lymphocytes Relative 18 %   Lymphs Abs 1.5 0.7 - 4.0 K/uL   Monocytes Relative 6 %   Monocytes Absolute 0.5 0.1 - 1.0 K/uL   Eosinophils Relative 7 %   Eosinophils Absolute 0.6 (H) 0.0 - 0.5 K/uL   Basophils Relative 0 %   Basophils Absolute 0.0 0.0 - 0.1 K/uL   Immature Granulocytes 0 %   Abs Immature Granulocytes 0.02 0.00 - 0.07 K/uL    Comment: Performed at [facility], [address], [place], [place] [address]  Magnesium      Status: None   Collection Time: 06/26/24  9:43 AM  Result Value Ref Range   Magnesium  2.0 1.7 - 2.4 mg/dL    Comment: Performed at [facility], [address], [place], [place] [address]  Urinalysis, w/ Reflex to Culture (Infection Suspected) -Urine, Clean Catch     Status: Abnormal   Collection Time: 06/26/24  9:43 AM  Result Value Ref Range   Specimen Source URINE, CLEAN CATCH    Color, Urine YELLOW YELLOW   APPearance CLOUDY (A) CLEAR   Specific Gravity, Urine 1.012 1.005 - 1.030   pH 5.0 5.0 - 8.0   Glucose, UA NEGATIVE NEGATIVE mg/dL   Hgb urine dipstick NEGATIVE NEGATIVE   Bilirubin Urine NEGATIVE NEGATIVE   Ketones, ur NEGATIVE NEGATIVE mg/dL   Protein, ur NEGATIVE NEGATIVE  mg/dL   Nitrite NEGATIVE NEGATIVE   Leukocytes,Ua MODERATE (A) NEGATIVE   RBC / HPF 0-5 0 - 5 RBC/hpf   WBC, UA 0-5 0 - 5 WBC/hpf    Comment:        Reflex urine culture not performed if WBC <=10, OR if Squamous epithelial cells >5. If Squamous epithelial cells >5 suggest recollection.    Bacteria, UA MANY (A) NONE SEEN   Squamous Epithelial / HPF 11-20 0 - 5 /HPF   Mucus PRESENT     Comment: Performed at [facility], [address] [place], [place] [address]  Urine Culture     Status: Abnormal   Collection Time: 06/26/24 11:40 AM   Specimen: Urine, Clean Catch  Result Value Ref Range   Specimen Description URINE, CLEAN CATCH    Special Requests      NONE Performed at [facility], [address] [place], [place] [address]    Culture  >=100,000 COLONIES/mL ESCHERICHIA COLI (A)    Report Status 06/28/2024 FINAL    Organism ID, Bacteria ESCHERICHIA COLI (A)       Susceptibility   Escherichia coli - MIC*    AMPICILLIN <=2 SENSITIVE Sensitive     CEFAZOLIN  (URINE) Value in next row Sensitive      <=1 SENSITIVEThis is a modified FDA-approved test that has been validated and its performance characteristics determined by the reporting laboratory.  This laboratory is certified under the Clinical Laboratory Improvement Amendments CLIA as qualified to perform high complexity clinical laboratory testing.    CEFEPIME Value in next row Sensitive      <=1 SENSITIVEThis is a modified FDA-approved test that has been validated and its performance characteristics determined by the reporting laboratory.  This laboratory is certified under the Clinical Laboratory Improvement Amendments CLIA as qualified to perform high complexity clinical laboratory testing.    ERTAPENEM Value in next row Sensitive      <=1 SENSITIVEThis is a modified FDA-approved test that has been validated and its performance characteristics determined by the reporting laboratory.  This laboratory is certified under the Clinical Laboratory Improvement Amendments CLIA as qualified to perform high complexity clinical laboratory testing.    CEFTRIAXONE  Value in next row Sensitive      <=1 SENSITIVEThis is a modified FDA-approved test that has been validated and its performance characteristics determined by the reporting laboratory.  This laboratory is certified under the Clinical Laboratory Improvement Amendments CLIA as qualified to perform high complexity clinical laboratory testing.    CIPROFLOXACIN Value in next row Sensitive      <=1 SENSITIVEThis is a modified FDA-approved test that has been validated and its performance characteristics determined by the reporting laboratory.  This laboratory is certified under the Clinical Laboratory Improvement Amendments CLIA as qualified to  perform high complexity clinical laboratory testing.    GENTAMICIN Value in next row Sensitive      <=1 SENSITIVEThis is a modified FDA-approved test that has been validated and its performance characteristics determined by the reporting laboratory.  This laboratory is certified under the Clinical Laboratory Improvement Amendments CLIA as qualified to perform high complexity clinical laboratory testing.    NITROFURANTOIN Value in next row Sensitive      <=1 SENSITIVEThis is a modified FDA-approved test that has been validated and its performance characteristics determined by the reporting laboratory.  This laboratory is certified under the Clinical Laboratory Improvement Amendments CLIA as qualified to perform high complexity clinical laboratory testing.  TRIMETH /SULFA  Value in next row Sensitive      <=1 SENSITIVEThis is a modified FDA-approved test that has been validated and its performance characteristics determined by the reporting laboratory.  This laboratory is certified under the Clinical Laboratory Improvement Amendments CLIA as qualified to perform high complexity clinical laboratory testing.    AMPICILLIN/SULBACTAM Value in next row Sensitive      <=1 SENSITIVEThis is a modified FDA-approved test that has been validated and its performance characteristics determined by the reporting laboratory.  This laboratory is certified under the Clinical Laboratory Improvement Amendments CLIA as qualified to perform high complexity clinical laboratory testing.    PIP/TAZO Value in next row Sensitive ug/mL     <=4 SENSITIVEThis is a modified FDA-approved test that has been validated and its performance characteristics determined by the reporting laboratory.  This laboratory is certified under the Clinical Laboratory Improvement Amendments CLIA as qualified to perform high complexity clinical laboratory testing.    MEROPENEM Value in next row Sensitive      <=4 SENSITIVEThis is a modified FDA-approved  test that has been validated and its performance characteristics determined by the reporting laboratory.  This laboratory is certified under the Clinical Laboratory Improvement Amendments CLIA as qualified to perform high complexity clinical laboratory testing.    * >=100,000 COLONIES/mL ESCHERICHIA COLI  RPR     Status: None   Collection Time: 06/26/24  4:30 PM  Result Value Ref Range   RPR Ser Ql NON REACTIVE NON REACTIVE    Comment: Performed at [facility], [address] [place], [place] [address]  HIV Antibody (routine testing w rflx)     Status: None   Collection Time: 06/26/24  5:13 PM  Result Value Ref Range   HIV Screen 4th Generation wRfx Non Reactive Non Reactive    Comment: Performed at [facility], [address] [place], [place] [address]  Vitamin B12     Status: Abnormal   Collection Time: 06/26/24  5:14 PM  Result Value Ref Range   Vitamin B-12 >4,000 (H) 180 - 914 pg/mL    Comment: Performed at [facility], [address], [place], [place] [address]  TSH     Status: None   Collection Time: 06/26/24  5:16 PM  Result Value Ref Range   TSH 2.291 0.350 - 4.500 uIU/mL    Comment: Performed by a 3rd Generation assay with a functional sensitivity of <=0.01 uIU/mL. Performed at [facility], [address], [place], [place] [address]   Folate     Status: None   Collection Time: 06/26/24  5:16 PM  Result Value Ref Range   Folate >20.0 >5.9 ng/mL    Comment: Performed at [facility], [address] [place], [place] [address]    Radiology CT Head Wo Contrast Result Date: 06/26/2024 CLINICAL DATA:  Mental status change, unknown cause. EXAM: CT HEAD WITHOUT CONTRAST TECHNIQUE: Contiguous axial images were obtained from the base of the skull through the vertex without intravenous contrast. RADIATION DOSE REDUCTION: This exam was performed according to the departmental dose-optimization program which includes automated exposure control,  adjustment of the mA and/or kV according to patient size and/or use of iterative reconstruction technique. COMPARISON:  MRI brain from 08/21/2021. FINDINGS: Brain: No evidence of acute infarction, hemorrhage, hydrocephalus, extra-axial collection or mass lesion/mass effect. Ventricles are normal. Cerebral volume is age appropriate. Vascular: No hyperdense vessel or unexpected calcification. Skull: Normal. Negative for fracture or focal  lesion. Sinuses/Orbits: No acute finding. Other: Visualized mastoid air cells are unremarkable. No mastoid effusion. IMPRESSION: *No acute intracranial abnormality. Electronically Signed   By: Ree Molt M.D.   On: 06/26/2024 10:37   DG Chest 1 View Result Date: 06/26/2024 CLINICAL DATA:  [id number] Confusion [id number]. EXAM: CHEST  1 VIEW COMPARISON:  04/03/2012. FINDINGS: Linear area of scarring/atelectasis noted overlying the left lateral costophrenic angle. Bilateral lung fields are otherwise clear. Bilateral costophrenic angles are clear. Normal cardio-mediastinal silhouette. No acute osseous abnormalities. The soft tissues are within normal limits. Neurostimulator device noted overlying the right upper abdomen with its lead overlying the midthoracic spine region. IMPRESSION: No active disease. Electronically Signed   By: Ree Molt M.D.   On: 06/26/2024 10:35   DG Lumbar Spine 2-3 Views Result Date: 06/22/2024 EXAM: 2 or 3 VIEW(S) XRAY OF THE LUMBAR SPINE 06/22/2024 08:58:05 AM COMPARISON: None available. CLINICAL HISTORY: Surgery, elective [id number]. Spinal stimulator. FINDINGS: 3 fluoroscopics spot views of the spine submitted from the operating room. Imaging obtained during spinal stimulator placement. Fluoroscopy time 8 seconds. Dose 3.1029 IMPRESSION: 1. Procedure of fluoroscopy during spinal cord stimulator placement. Electronically signed by: Andrea Gasman MD 06/22/2024 10:12 AM EDT RP Workstation: [id number]   DG C-Arm 1-60 Min-No Report Result Date:  06/22/2024 Fluoroscopy was utilized by the requesting physician.  No radiographic interpretation.   DG C-Arm 1-60 Min-No Report Result Date: 06/22/2024 Fluoroscopy was utilized by the requesting physician.  No radiographic interpretation.    Assessment/Plan  Carotid stenosis Mild.  To be checked again in about a year.  Essential hypertension blood pressure control important in reducing the progression of atherosclerotic disease. On appropriate oral medications.   Diabetes mellitus with polyneuropathy (HCC) blood glucose control important in reducing the progression of atherosclerotic disease. Also, involved in wound healing. On appropriate medications.   Ulcerated, foot, right, limited to breakdown of skin (HCC) We performed ABIs today which were normal at 1.09 on the right and 1.15 on the left with triphasic waveforms and normal digital pressures and waveforms bilaterally.  We discussed today with she and her husband that her perfusion is excellent at the macrovascular level.  She may have some microvascular disease from her diabetes, but there is not much we can do to assist with that other thing keep her sugars under good control.  She is on aspirin  which is appropriate.  No further vascular intervention or workup is planned with her normal perfusion at this time.  I will defer to her podiatrist in terms of local wound care of the right foot.    Selinda Gu, MD  07/06/2024 9:19 AM    This note was created with Dragon medical transcription system.  Any errors from dictation are purely unintentional

## 2024-07-06 NOTE — Addendum Note (Signed)
 Addended byBETHA HILMA HASTINGS on: 07/06/2024 01:43 PM   Modules accepted: Orders

## 2024-07-06 NOTE — Telephone Encounter (Signed)
 Oxycodone  sent to [facility] on [address]. I called patient and let her know.

## 2024-07-06 NOTE — Telephone Encounter (Signed)
 Spoke with patient. She is taking oxycodone  5mg - doing 1/2 bid to tid, usually bid.   No side effects of confusion, hallucinations. She feels well.   Will refill with directions of 1 po bid, but she will keep doing 1/2 if that is covering her pain. Should not take more than 2 pills in 24 hours. Discussed that Dr. Auston was okay with this plan.   PMP reviewed and is appropriate. Oxycodone  sent to pharmacy.

## 2024-07-06 NOTE — Assessment & Plan Note (Signed)
 Mild.  To be checked again in about a year.

## 2024-07-06 NOTE — Addendum Note (Signed)
 Addended byBETHA HILMA HASTINGS on: 07/06/2024 04:19 PM   Modules accepted: Orders

## 2024-07-12 ENCOUNTER — Ambulatory Visit: Admitting: Podiatry

## 2024-07-12 DIAGNOSIS — L97512 Non-pressure chronic ulcer of other part of right foot with fat layer exposed: Secondary | ICD-10-CM | POA: Diagnosis not present

## 2024-07-12 MED ORDER — SANTYL 250 UNIT/GM EX OINT: 1.0000 | TOPICAL_OINTMENT | Freq: Every day | CUTANEOUS | 0 refills | Status: DC

## 2024-07-12 NOTE — Progress Notes (Signed)
 Subjective:  Patient ID: Melody Brooks, female    DOB: Sep 10, 1956,  MRN: [id number]  Chief Complaint  Patient presents with   Foot Pain    follow up sore on foot. Lots of pain on the right foot. She thinks the same thing is happening on the left    68 y.o. female presents for wound care.  Patient presents with complaint of right submetatarsal 5 ulceration with fat layer exposed.  She states they have been doing Betadine wet-to-dry dressing she is having a lot of pain denies any other acute complaints  Review of Systems: Negative except as noted in the HPI. Denies N/V/F/Ch.  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 pt stated on around October of 2022   Angioedema    Arterial fibromuscular dysplasia (HCC) 07/08/2015   Arthritis    Cancer (HCC)    Carotid stenosis 12/16/2017   Chronic kidney disease    Chronic pain syndrome 03/20/2024   Chronic urticaria 09/10/2019   Constipation    Diabetes mellitus with polyneuropathy (HCC) 07/17/2003   Fatigue    Fibromuscular dysplasia (HCC)    Generalized headaches    GERD (gastroesophageal reflux disease)    Hemorrhoids    Hiatal hernia    Hypertension    Idiopathic scoliosis 07/30/2002   Lumbar facet arthropathy 03/20/2024   Lumbosacral radiculitis 12/11/2014   Neuropathy    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   a.) presurgical PCR (+) 06/13/2024 prior to THORACIC LAMINECTOMY FOR SCS IMPLANTATION   Palpitations    Pneumonia    Rectal bleeding    Rectal pain    Spinal stenosis 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 03/20/2024    Current Outpatient Medications:    acetaminophen  (TYLENOL ) 500 MG tablet, Take 1,000 mg by mouth every 6 (six) hours as needed for moderate pain., Disp: , Rfl:    amLODipine  (NORVASC ) 5 MG tablet, Take 5 mg by mouth daily., Disp: , Rfl:    aspirin  81 MG tablet, Take 81 mg by mouth daily., Disp: , Rfl:     Cholecalciferol  (DIALYVITE VITAMIN D  5000) 125 MCG (5000 UT) capsule, Take 5,000 Units by mouth daily., Disp: , Rfl:    Cyanocobalamin  (B-12 IJ), Inject 1,000 Units as directed every 14 (fourteen) days., Disp: , Rfl:    EPIPEN  2-PAK 0.3 MG/0.3ML SOAJ injection, Inject 0.3 mg into the muscle as needed for anaphylaxis., Disp: , Rfl:    esomeprazole (NEXIUM) 20 MG capsule, Take 40 mg by mouth daily at 12 noon., Disp: , Rfl:    estrogen, conjugated,-medroxyprogesterone (PREMPRO) 0.3-1.5 MG tablet, Take 1 tablet by mouth daily., Disp: , Rfl:    famotidine  (PEPCID ) 20 MG tablet, Take 20 mg by mouth at bedtime., Disp: , Rfl:    ferrous sulfate  325 (65 FE) MG tablet, Take 325 mg by mouth at bedtime., Disp: , Rfl:    fluticasone-salmeterol (ADVAIR) 250-50 MCG/ACT AEPB, Inhale 1 puff into the lungs 2 (two) times daily as needed (chest congestion)., Disp: , Rfl:    furosemide (LASIX) 20 MG tablet, Take 20 mg by mouth daily., Disp: , Rfl:    glipiZIDE  (GLUCOTROL  XL) 2.5 MG 24 hr tablet, TAKE 1 TABLET BY MOUTH  DAILY WITH BREAKFAST, Disp: 90 tablet, Rfl: 2   JANUMET  50-1000 MG tablet, TAKE 1 TABLET BY MOUTH  TWICE DAILY (Patient taking differently: Take 1 tablet by mouth daily.), Disp: 180 tablet, Rfl: 2  magnesium  oxide (MAG-OX) 400 (240 Mg) MG tablet, Take 400 mg by mouth daily., Disp: , Rfl:    oxyCODONE  (ROXICODONE ) 5 MG immediate release tablet, Take 1 tablet (5 mg total) by mouth every 12 (twelve) hours as needed for severe pain (pain score 7-10)., Disp: 10 tablet, Rfl: 0   phentermine  (ADIPEX-P ) 37.5 MG tablet, Take 37.5 mg by mouth every morning., Disp: , Rfl:    pioglitazone  (ACTOS ) 45 MG tablet, TAKE 1 TABLET BY MOUTH AT  BEDTIME, Disp: 60 tablet, Rfl: 5   propranolol  ER (INDERAL  LA) 60 MG 24 hr capsule, Take 60 mg by mouth daily., Disp: , Rfl:    rosuvastatin  (CRESTOR ) 10 MG tablet, Take 10 mg by mouth daily., Disp: , Rfl:    Semaglutide (OZEMPIC, 0.25 OR 0.5 MG/DOSE, [place]), Inject 1 mg into the skin  once a week. Mondays, Disp: , Rfl:    spironolactone (ALDACTONE) 25 MG tablet, Take 25 mg by mouth daily., Disp: , Rfl:    topiramate  (TOPAMAX ) 50 MG tablet, Take 50 mg by mouth 2 (two) times daily., Disp: , Rfl:    zinc  gluconate 50 MG tablet, Take 50 mg by mouth daily., Disp: , Rfl:   Social History   Tobacco Use  Smoking Status Never  Smokeless Tobacco Never  Tobacco Comments   tobacco use- no    Allergies  Allergen Reactions   Lisinopril Hives, Itching, Swelling and Rash    Angioedema   Objective:  There were no vitals filed for this visit. There is no height or weight on file to calculate BMI. Constitutional Well developed. Well nourished.  Vascular Dorsalis pedis pulses nonpalpable bilaterally. Posterior tibial pulses nonpalpable bilaterally. Capillary refill normal to all digits.  No cyanosis or clubbing noted. Pedal hair growth not present  Neurologic Normal speech. Oriented to person, place, and time. Protective sensation absent  Dermatologic Wound Location: Right submet 5 fat layer exposed does not probe down to deep tissue.  No bone exposure noted no malodor present mild erythema noted.  No purulent drainage noted Wound Base: Mixed Granular/Fibrotic Peri-wound: Calloused Exudate: Scant/small amount Serosanguinous exudate Wound Measurements: - See below  Orthopedic: No pain to palpation either foot.   Radiographs: None Assessment:   1. Foot ulcer with fat layer exposed, right (HCC)    Plan:  Patient was evaluated and treated and all questions answered.  Ulcer right submet 5 ulceration fat layer exposed -Debridement as below. -Dressed with Santyl  wet-to-dry dressing sent over sent to the pharmacy -Continue off-loading with surgical shoe. - Doxycycline  was dispensed for skin and soft tissue prophylaxis  Vascular abnormality - ABIs PVRs within normal limits.  No vascular interventions indicated  Procedure: Excisional Debridement of Wound~stagnant Tool:  Sharp chisel blade/tissue nipper Rationale: Removal of non-viable soft tissue from the wound to promote healing.  Anesthesia: none Pre-Debridement Wound Measurements: 1 cm x 0.5 cm x 0.3 cm  Post-Debridement Wound Measurements: 1.1 cm x 0.6 cm x 0.3 cm  Type of Debridement: Sharp Excisional Tissue Removed: Non-viable soft tissue Blood loss: Minimal (<50cc) Depth of Debridement: subcutaneous tissue. Technique: Sharp excisional debridement to bleeding, viable wound base.  Wound Progress: This is my initial evaluation will continue monitor the progression of the wound Site healing conversation 7 Dressing: Dry, sterile, compression dressing. Disposition: Patient tolerated procedure well. Patient to return in 1 week for follow-up.  No follow-ups on file.     Right submet 5 ulceration fat layer exposed Betadine wet-to-dry surgical shoe  Doxycycline   Vascular abnormality ABIs  PVRs and referral to vascular surgery

## 2024-07-18 ENCOUNTER — Telehealth: Payer: Self-pay | Admitting: Orthopedic Surgery

## 2024-07-18 ENCOUNTER — Other Ambulatory Visit: Payer: Self-pay

## 2024-07-18 ENCOUNTER — Inpatient Hospital Stay
Admission: EM | Admit: 2024-07-18 | Discharge: 2024-07-23 | DRG: 857 | Disposition: A | Discharge status: Home / Self Care | Attending: Student | Admitting: Student

## 2024-07-18 ENCOUNTER — Emergency Department

## 2024-07-18 ENCOUNTER — Other Ambulatory Visit: Payer: Self-pay | Admitting: Neurosurgery

## 2024-07-18 ENCOUNTER — Ambulatory Visit

## 2024-07-18 VITALS — Temp 97.6°F

## 2024-07-18 DIAGNOSIS — Y838 Other surgical procedures as the cause of abnormal reaction of the patient, or of later complication, without mention of misadventure at the time of the procedure: Secondary | ICD-10-CM | POA: Diagnosis present

## 2024-07-18 DIAGNOSIS — L97519 Non-pressure chronic ulcer of other part of right foot with unspecified severity: Secondary | ICD-10-CM | POA: Diagnosis present

## 2024-07-18 DIAGNOSIS — I10 Essential (primary) hypertension: Secondary | ICD-10-CM | POA: Diagnosis present

## 2024-07-18 DIAGNOSIS — L02212 Cutaneous abscess of back [any part, except buttock]: Secondary | ICD-10-CM | POA: Diagnosis present

## 2024-07-18 DIAGNOSIS — B9562 Methicillin resistant Staphylococcus aureus infection as the cause of diseases classified elsewhere: Secondary | ICD-10-CM | POA: Diagnosis present

## 2024-07-18 DIAGNOSIS — T8149XA Infection following a procedure, other surgical site, initial encounter: Principal | ICD-10-CM

## 2024-07-18 DIAGNOSIS — E66812 Obesity, class 2: Secondary | ICD-10-CM | POA: Diagnosis present

## 2024-07-18 DIAGNOSIS — Z888 Allergy status to other drugs, medicaments and biological substances status: Secondary | ICD-10-CM | POA: Diagnosis not present

## 2024-07-18 DIAGNOSIS — Z6836 Body mass index (BMI) 36.0-36.9, adult: Secondary | ICD-10-CM

## 2024-07-18 DIAGNOSIS — Z833 Family history of diabetes mellitus: Secondary | ICD-10-CM

## 2024-07-18 DIAGNOSIS — N1832 Chronic kidney disease, stage 3b: Secondary | ICD-10-CM | POA: Diagnosis present

## 2024-07-18 DIAGNOSIS — D631 Anemia in chronic kidney disease: Secondary | ICD-10-CM | POA: Diagnosis present

## 2024-07-18 DIAGNOSIS — E785 Hyperlipidemia, unspecified: Secondary | ICD-10-CM | POA: Diagnosis present

## 2024-07-18 DIAGNOSIS — T8140XA Infection following a procedure, unspecified, initial encounter: Secondary | ICD-10-CM | POA: Diagnosis not present

## 2024-07-18 DIAGNOSIS — Z7984 Long term (current) use of oral hypoglycemic drugs: Secondary | ICD-10-CM

## 2024-07-18 DIAGNOSIS — G894 Chronic pain syndrome: Secondary | ICD-10-CM | POA: Diagnosis present

## 2024-07-18 DIAGNOSIS — E1122 Type 2 diabetes mellitus with diabetic chronic kidney disease: Secondary | ICD-10-CM | POA: Diagnosis present

## 2024-07-18 DIAGNOSIS — Z8261 Family history of arthritis: Secondary | ICD-10-CM | POA: Diagnosis not present

## 2024-07-18 DIAGNOSIS — Z79899 Other long term (current) drug therapy: Secondary | ICD-10-CM

## 2024-07-18 DIAGNOSIS — T8141XA Infection following a procedure, superficial incisional surgical site, initial encounter: Principal | ICD-10-CM | POA: Diagnosis present

## 2024-07-18 DIAGNOSIS — D509 Iron deficiency anemia, unspecified: Secondary | ICD-10-CM | POA: Diagnosis present

## 2024-07-18 DIAGNOSIS — M549 Dorsalgia, unspecified: Secondary | ICD-10-CM | POA: Diagnosis present

## 2024-07-18 DIAGNOSIS — Z7951 Long term (current) use of inhaled steroids: Secondary | ICD-10-CM | POA: Diagnosis not present

## 2024-07-18 DIAGNOSIS — Z7985 Long-term (current) use of injectable non-insulin antidiabetic drugs: Secondary | ICD-10-CM | POA: Diagnosis not present

## 2024-07-18 DIAGNOSIS — Z7982 Long term (current) use of aspirin: Secondary | ICD-10-CM

## 2024-07-18 DIAGNOSIS — I129 Hypertensive chronic kidney disease with stage 1 through stage 4 chronic kidney disease, or unspecified chronic kidney disease: Secondary | ICD-10-CM | POA: Diagnosis present

## 2024-07-18 DIAGNOSIS — L7682 Other postprocedural complications of skin and subcutaneous tissue: Secondary | ICD-10-CM | POA: Diagnosis not present

## 2024-07-18 DIAGNOSIS — Z85828 Personal history of other malignant neoplasm of skin: Secondary | ICD-10-CM | POA: Diagnosis not present

## 2024-07-18 DIAGNOSIS — Z8249 Family history of ischemic heart disease and other diseases of the circulatory system: Secondary | ICD-10-CM | POA: Diagnosis not present

## 2024-07-18 DIAGNOSIS — N189 Chronic kidney disease, unspecified: Secondary | ICD-10-CM | POA: Diagnosis not present

## 2024-07-18 DIAGNOSIS — E1142 Type 2 diabetes mellitus with diabetic polyneuropathy: Secondary | ICD-10-CM | POA: Diagnosis present

## 2024-07-18 DIAGNOSIS — Z820 Family history of epilepsy and other diseases of the nervous system: Secondary | ICD-10-CM

## 2024-07-18 DIAGNOSIS — Z8614 Personal history of Methicillin resistant Staphylococcus aureus infection: Secondary | ICD-10-CM

## 2024-07-18 DIAGNOSIS — L03312 Cellulitis of back [any part except buttock]: Secondary | ICD-10-CM | POA: Diagnosis present

## 2024-07-18 DIAGNOSIS — T148XXA Other injury of unspecified body region, initial encounter: Secondary | ICD-10-CM | POA: Diagnosis not present

## 2024-07-18 DIAGNOSIS — K219 Gastro-esophageal reflux disease without esophagitis: Secondary | ICD-10-CM | POA: Diagnosis present

## 2024-07-18 DIAGNOSIS — L0291 Cutaneous abscess, unspecified: Secondary | ICD-10-CM | POA: Diagnosis not present

## 2024-07-18 DIAGNOSIS — Z825 Family history of asthma and other chronic lower respiratory diseases: Secondary | ICD-10-CM

## 2024-07-18 DIAGNOSIS — T8130XA Disruption of wound, unspecified, initial encounter: Secondary | ICD-10-CM

## 2024-07-18 LAB — COMPREHENSIVE METABOLIC PANEL WITH GFR
ALT: 11 U/L (ref 0–44)
AST: 15 U/L (ref 15–41)
Albumin: 3.8 g/dL (ref 3.5–5.0)
Alkaline Phosphatase: 67 U/L (ref 38–126)
Anion gap: 8 (ref 5–15)
BUN: 30 mg/dL — ABNORMAL HIGH (ref 8–23)
CO2: 25 mmol/L (ref 22–32)
Calcium: 9.4 mg/dL (ref 8.9–10.3)
Chloride: 107 mmol/L (ref 98–111)
Creatinine, Ser: 1.73 mg/dL — ABNORMAL HIGH (ref 0.44–1.00)
GFR, Estimated: 32 mL/min — ABNORMAL LOW (ref >60)
Glucose, Bld: 110 mg/dL — ABNORMAL HIGH (ref 70–99)
Potassium: 4.5 mmol/L (ref 3.5–5.1)
Sodium: 140 mmol/L (ref 135–145)
Total Bilirubin: 0.6 mg/dL (ref 0.0–1.2)
Total Protein: 6.6 g/dL (ref 6.5–8.1)

## 2024-07-18 LAB — CBC WITH DIFFERENTIAL/PLATELET
Abs Immature Granulocytes: 0.02 K/uL (ref 0.00–0.07)
Basophils Absolute: 0 K/uL (ref 0.0–0.1)
Basophils Relative: 1 %
Eosinophils Absolute: 0.3 K/uL (ref 0.0–0.5)
Eosinophils Relative: 4 %
HCT: 34.1 % — ABNORMAL LOW (ref 36.0–46.0)
Hemoglobin: 10.9 g/dL — ABNORMAL LOW (ref 12.0–15.0)
Immature Granulocytes: 0 %
Lymphocytes Relative: 26 %
Lymphs Abs: 2 K/uL (ref 0.7–4.0)
MCH: 30.8 pg (ref 26.0–34.0)
MCHC: 32 g/dL (ref 30.0–36.0)
MCV: 96.3 fL (ref 80.0–100.0)
Monocytes Absolute: 0.7 K/uL (ref 0.1–1.0)
Monocytes Relative: 9 %
Neutro Abs: 4.5 K/uL (ref 1.7–7.7)
Neutrophils Relative %: 60 %
Platelets: 259 K/uL (ref 150–400)
RBC: 3.54 MIL/uL — ABNORMAL LOW (ref 3.87–5.11)
RDW: 14.6 % (ref 11.5–15.5)
WBC: 7.5 K/uL (ref 4.0–10.5)
nRBC: 0 % (ref 0.0–0.2)

## 2024-07-18 LAB — LACTIC ACID, PLASMA: Lactic Acid, Venous: 1.5 mmol/L (ref 0.5–1.9)

## 2024-07-18 MED ORDER — ACETAMINOPHEN 325 MG PO TABS
650.0000 mg | ORAL_TABLET | Freq: Four times a day (QID) | ORAL | Status: DC | PRN
  Administered 2024-07-22: 650 mg via ORAL
  Filled 2024-07-18 (×2): qty 2

## 2024-07-18 MED ORDER — AMLODIPINE BESYLATE 5 MG PO TABS
5.0000 mg | ORAL_TABLET | Freq: Every day | ORAL | Status: DC
  Administered 2024-07-19 – 2024-07-23 (×5): 5 mg via ORAL
  Filled 2024-07-18 (×5): qty 1

## 2024-07-18 MED ORDER — FUROSEMIDE 20 MG PO TABS
20.0000 mg | ORAL_TABLET | Freq: Every day | ORAL | Status: DC
  Administered 2024-07-19 – 2024-07-23 (×5): 20 mg via ORAL
  Filled 2024-07-18 (×5): qty 1

## 2024-07-18 MED ORDER — GLIPIZIDE ER 2.5 MG PO TB24
2.5000 mg | ORAL_TABLET | Freq: Every day | ORAL | Status: DC
  Administered 2024-07-19: 2.5 mg via ORAL
  Filled 2024-07-18 (×2): qty 1

## 2024-07-18 MED ORDER — ACETAMINOPHEN 650 MG RE SUPP: 650.0000 mg | Freq: Four times a day (QID) | RECTAL | Status: DC | PRN

## 2024-07-18 MED ORDER — FAMOTIDINE 20 MG PO TABS
20.0000 mg | ORAL_TABLET | Freq: Every day | ORAL | Status: DC
  Administered 2024-07-19 – 2024-07-22 (×4): 20 mg via ORAL
  Filled 2024-07-18 (×4): qty 1

## 2024-07-18 MED ORDER — CEFEPIME HCL 2 G IV SOLR
2.0000 g | Freq: Once | INTRAVENOUS | Status: AC
  Administered 2024-07-18: 2 g via INTRAVENOUS
  Filled 2024-07-18: qty 12.5

## 2024-07-18 MED ORDER — SITAGLIPTIN PHOS-METFORMIN HCL 50-1000 MG PO TABS: 1.0000 | ORAL_TABLET | Freq: Every day | ORAL | Status: DC

## 2024-07-18 MED ORDER — OXYCODONE HCL 5 MG PO TABS: 5.0000 mg | ORAL_TABLET | Freq: Two times a day (BID) | ORAL | Status: DC | PRN

## 2024-07-18 MED ORDER — ONDANSETRON HCL 4 MG PO TABS: 4.0000 mg | ORAL_TABLET | Freq: Four times a day (QID) | ORAL | Status: DC | PRN

## 2024-07-18 MED ORDER — TRAZODONE HCL 50 MG PO TABS
25.0000 mg | ORAL_TABLET | Freq: Every evening | ORAL | Status: DC | PRN
  Administered 2024-07-19: 25 mg via ORAL
  Filled 2024-07-18: qty 1

## 2024-07-18 MED ORDER — VANCOMYCIN HCL 1250 MG/250ML IV SOLN
1250.0000 mg | Freq: Once | INTRAVENOUS | Status: AC
  Administered 2024-07-18: 1250 mg via INTRAVENOUS
  Filled 2024-07-18: qty 250

## 2024-07-18 MED ORDER — VANCOMYCIN HCL IN DEXTROSE 1-5 GM/200ML-% IV SOLN: 1000.0000 mg | Freq: Once | INTRAVENOUS | Status: DC

## 2024-07-18 MED ORDER — ZINC GLUCONATE 50 MG PO TABS: 50.0000 mg | ORAL_TABLET | Freq: Every day | ORAL | Status: DC

## 2024-07-18 MED ORDER — VITAMIN D3 25 MCG (1000 UNIT) PO TABS
5000.0000 [IU] | ORAL_TABLET | Freq: Every day | ORAL | Status: DC
Start: 2024-07-19 — End: 2024-07-23
  Administered 2024-07-19 – 2024-07-23 (×5): 5000 [IU] via ORAL
  Filled 2024-07-18 (×9): qty 5

## 2024-07-18 MED ORDER — PIOGLITAZONE HCL 30 MG PO TABS
45.0000 mg | ORAL_TABLET | Freq: Every day | ORAL | Status: DC
Start: 2024-07-18 — End: 2024-07-20
  Administered 2024-07-18 – 2024-07-19 (×2): 45 mg via ORAL
  Filled 2024-07-18 (×2): qty 1

## 2024-07-18 MED ORDER — MAGNESIUM HYDROXIDE 400 MG/5ML PO SUSP: 30.0000 mL | Freq: Every day | ORAL | Status: DC | PRN

## 2024-07-18 MED ORDER — FLUTICASONE FUROATE-VILANTEROL 200-25 MCG/ACT IN AEPB
1.0000 | INHALATION_SPRAY | Freq: Every day | RESPIRATORY_TRACT | Status: DC
Start: 2024-07-19 — End: 2024-07-19

## 2024-07-18 MED ORDER — CONJ ESTROG-MEDROXYPROGEST ACE 0.3-1.5 MG PO TABS: 1.0000 | ORAL_TABLET | Freq: Every day | ORAL | Status: DC

## 2024-07-18 MED ORDER — VANCOMYCIN HCL IN DEXTROSE 1-5 GM/200ML-% IV SOLN
1000.0000 mg | Freq: Once | INTRAVENOUS | Status: AC
  Administered 2024-07-18: 1000 mg via INTRAVENOUS
  Filled 2024-07-18: qty 200

## 2024-07-18 MED ORDER — SODIUM CHLORIDE 0.9 % IV SOLN: 2.0000 g | Freq: Once | INTRAVENOUS | Status: DC

## 2024-07-18 MED ORDER — SODIUM CHLORIDE 0.9 % IV SOLN
2.0000 g | Freq: Two times a day (BID) | INTRAVENOUS | Status: DC
  Administered 2024-07-19: 2 g via INTRAVENOUS
  Filled 2024-07-18 (×2): qty 12.5

## 2024-07-18 MED ORDER — ONDANSETRON HCL 4 MG/2ML IJ SOLN: 4.0000 mg | Freq: Four times a day (QID) | INTRAMUSCULAR | Status: DC | PRN

## 2024-07-18 MED ORDER — TOPIRAMATE 25 MG PO TABS
50.0000 mg | ORAL_TABLET | Freq: Two times a day (BID) | ORAL | Status: DC
  Administered 2024-07-18 – 2024-07-23 (×10): 50 mg via ORAL
  Filled 2024-07-18 (×10): qty 2

## 2024-07-18 MED ORDER — PROPRANOLOL HCL ER 60 MG PO CP24
60.0000 mg | ORAL_CAPSULE | Freq: Every day | ORAL | Status: DC
  Administered 2024-07-19 – 2024-07-23 (×5): 60 mg via ORAL
  Filled 2024-07-18 (×5): qty 1

## 2024-07-18 MED ORDER — EPINEPHRINE 0.3 MG/0.3ML IJ SOAJ: 0.3000 mg | INTRAMUSCULAR | Status: DC | PRN

## 2024-07-18 MED ORDER — PANTOPRAZOLE SODIUM 40 MG PO TBEC
40.0000 mg | DELAYED_RELEASE_TABLET | Freq: Every day | ORAL | Status: DC
  Administered 2024-07-19 – 2024-07-23 (×5): 40 mg via ORAL
  Filled 2024-07-18 (×5): qty 1

## 2024-07-18 MED ORDER — SODIUM CHLORIDE 0.9 % IV SOLN: INTRAVENOUS | Status: AC

## 2024-07-18 MED ORDER — LINAGLIPTIN 5 MG PO TABS
5.0000 mg | ORAL_TABLET | Freq: Every day | ORAL | Status: DC
  Administered 2024-07-19: 5 mg via ORAL
  Filled 2024-07-18 (×2): qty 1

## 2024-07-18 MED ORDER — FERROUS SULFATE 325 (65 FE) MG PO TABS
325.0000 mg | ORAL_TABLET | Freq: Every day | ORAL | Status: DC
Start: 2024-07-19 — End: 2024-07-23
  Administered 2024-07-19 – 2024-07-22 (×4): 325 mg via ORAL
  Filled 2024-07-18 (×4): qty 1

## 2024-07-18 MED ORDER — MEDIHONEY WOUND/BURN DRESSING EX PSTE
1.0000 | PASTE | Freq: Every day | CUTANEOUS | Status: DC
  Administered 2024-07-19 – 2024-07-23 (×5): 1 via TOPICAL
  Filled 2024-07-18: qty 44

## 2024-07-18 MED ORDER — MAGNESIUM OXIDE -MG SUPPLEMENT 400 (240 MG) MG PO TABS
400.0000 mg | ORAL_TABLET | Freq: Every day | ORAL | Status: DC
  Administered 2024-07-19 – 2024-07-23 (×5): 400 mg via ORAL
  Filled 2024-07-18 (×5): qty 1

## 2024-07-18 MED ORDER — VANCOMYCIN HCL 1750 MG/350ML IV SOLN
1750.0000 mg | INTRAVENOUS | Status: DC
Start: 2024-07-20 — End: 2024-07-19

## 2024-07-18 MED ORDER — ROSUVASTATIN CALCIUM 10 MG PO TABS
10.0000 mg | ORAL_TABLET | Freq: Every day | ORAL | Status: DC
Start: 2024-07-19 — End: 2024-07-23
  Administered 2024-07-19 – 2024-07-23 (×5): 10 mg via ORAL
  Filled 2024-07-18 (×5): qty 1

## 2024-07-18 MED ORDER — METFORMIN HCL 500 MG PO TABS
1000.0000 mg | ORAL_TABLET | Freq: Every day | ORAL | Status: DC
  Administered 2024-07-19: 1000 mg via ORAL
  Filled 2024-07-18: qty 2

## 2024-07-18 MED ORDER — SPIRONOLACTONE 25 MG PO TABS
25.0000 mg | ORAL_TABLET | Freq: Every day | ORAL | Status: DC
  Administered 2024-07-19 – 2024-07-23 (×5): 25 mg via ORAL
  Filled 2024-07-18 (×5): qty 1

## 2024-07-18 MED ORDER — PHENTERMINE HCL 37.5 MG PO TABS: 37.5000 mg | ORAL_TABLET | Freq: Every morning | ORAL | Status: DC

## 2024-07-18 NOTE — ED Triage Notes (Signed)
 PT had spinal stimulator placed aug 22n. Last night noticed bleeding from site. Went to [facility] today and was advised to come to ED for possible infection. Pt denies fever and denies pain.

## 2024-07-18 NOTE — Progress Notes (Signed)
 ED Pharmacy Antibiotic Sign Off An antibiotic consult was received from an ED provider for vancomycin  and cefepime  per pharmacy dosing for cellulitis. A chart review was completed to assess appropriateness.   The following one time order(s) were placed:  Vanc 2.25 g IV x 1 Cefepime  2 g IV x 1  Further antibiotic and/or antibiotic pharmacy consults should be ordered by the admitting provider if indicated.   Thank you for allowing pharmacy to be a part of this patient's care.   Lum VEAR Mania, [facility]  Clinical Pharmacist 07/18/24 7:27 PM

## 2024-07-18 NOTE — ED Provider Notes (Signed)
 [facility] Provider Note    Event Date/Time   First MD Initiated Contact with Patient 07/18/24 1615     (approximate)   History   Wound Infection   HPI  Melody Brooks is a 68 y.o. female recent implant of spinal nerve stimulator   About 2 days ago noticed increasing redness around her lower lumbar incision site and some drainage of blood.  She reports that the incision has looked red and did not look this way about 3 days ago.  She is concerned about infection  She would to the neurosurgery clinic and they sent her to the ER to get a CT scan  No fever.  No nausea or vomiting.  Other area of incision higher on the back is look normal.  It is not significantly increased with regard to pain or discomfort  Dr. Claudene advises that 1 blood culture has already been sent from his clinic today as well   Physical Exam   Triage Vital Signs: ED Triage Vitals  Encounter Vitals Group     BP 07/18/24 1409 (!) 153/79     Girls Systolic BP Percentile --      Girls Diastolic BP Percentile --      Boys Systolic BP Percentile --      Boys Diastolic BP Percentile --      Pulse Rate 07/18/24 1409 77     Resp 07/18/24 1409 16     Temp 07/18/24 1409 97.6 F (36.4 C)     Temp Source 07/18/24 1409 Oral     SpO2 07/18/24 1409 100 %     Weight 07/18/24 1409 240 lb (108.9 kg)     Height 07/18/24 1409 5' 8 (1.727 m)     Head Circumference --      Peak Flow --      Pain Score 07/18/24 1413 6     Pain Loc --      Pain Education --      Exclude from Growth Chart --     Most recent vital signs: Vitals:   07/18/24 1824 07/18/24 1830  BP:  117/60  Pulse:  71  Resp:  18  Temp: 98.2 F (36.8 C)   SpO2:  98%     General: Awake, no distress.  Very pleasant, sitting up on side of bed without distress CV:  Good peripheral perfusion.  Resp:  Normal effort.  Abd:  No distention.  Other:  Right lower lumbar region incision is probably 5 cm or so long, the medial  portion appears erythematous warm to touch with slight induration.  There is no frank purulent drainage but there is certainly evidence of erythema and warmth to touch in this site.   ED Results / Procedures / Treatments   Labs (all labs ordered are listed, but only abnormal results are displayed) Labs Reviewed  COMPREHENSIVE METABOLIC PANEL WITH GFR - Abnormal; Notable for the following components:      Result Value   Glucose, Bld 110 (*)    BUN 30 (*)    Creatinine, Ser 1.73 (*)    GFR, Estimated 32 (*)    All other components within normal limits  CBC WITH DIFFERENTIAL/PLATELET - Abnormal; Notable for the following components:   RBC 3.54 (*)    Hemoglobin 10.9 (*)    HCT 34.1 (*)    All other components within normal limits  CULTURE, BLOOD (SINGLE)  LACTIC ACID, PLASMA   Labs reviewed normal white count.  Very mild anemia.  Chronic renal disease  EKG     RADIOLOGY  CT Lumbar Spine Wo Contrast Result Date: 07/18/2024 EXAM: CT OF THE LUMBAR SPINE WITHOUT CONTRAST 07/18/2024 05:05:25 PM TECHNIQUE: CT of the lumbar spine was performed without the administration of intravenous contrast. Multiplanar reformatted images are provided for review. Automated exposure control, iterative reconstruction, and/or weight based adjustment of the mA/kV was utilized to reduce the radiation dose to as low as reasonably achievable. COMPARISON: MRI lumbar spine 09/06/2023 CLINICAL HISTORY: Low back pain, infection or inflammation suspected (Ped 0-17y); Concern for potential infection at surgical site. PT had spinal stimulator placed aug 22n. Last night noticed bleeding from site. Went to [facility] today and was advised to come to ED for possible infection. Pt denies fever and denies pain. FINDINGS: BONES AND ALIGNMENT: Moderate dextrocurvature of the lumbar spine centered at L2-L3 with slight component of rotary subluxation. No acute fracture in the lumbar spine. No destructive osseous lesion. No  focal osteopenia or erosive change appreciated. Schmorl's nodes in the lower thoracic spine, particularly at T11. At T12-L1, there is no significant spinal canal or foraminal stenosis. DEGENERATIVE CHANGES: At L1-2, there is mild disc space narrowing and vacuum disc phenomenon, a small disc bulge, and mild facet arthrosis. No significant spinal canal or foraminal stenosis. At L2-L3, there is mild disc space narrowing and vacuum disc phenomenon. There is a small disc bulge, mild-to-moderate facet arthrosis, and thickening of the ligamentum flavum. There is no significant spinal canal stenosis or foraminal stenosis. At L3-L4, there is moderate disc space narrowing and vacuum disc phenomenon. There is a diffuse disc bulge resulting in lateral recess narrowing, moderate facet arthrosis, and thickening of the ligamentum flavum. There is moderate spinal canal stenosis and moderate foraminal stenosis on the right. At L4-5, there is a diffuse disc bulge and moderate facet arthrosis. Thickening of the ligamentum flavum is present. There is lateral recess narrowing and mild spinal canal stenosis. There is mild foraminal stenosis on the left. At L5-S1, there is mild disc space narrowing and vacuum disc phenomenon, small disc bulge, and moderate facet arthrosis. No significant spinal canal stenosis. There is moderate foraminal stenosis on the left. SOFT TISSUES: Spinal stimulator pack noted within the subcutaneous tissues right of midline over the lumbar spine at the level of L2. There is mild thickening of the skin overlying the stimulator site which may reflect postsurgical changes versus cellulitis. Within the limitations of streak artifact, there is no evidence of surrounding fluid collection. No locules of gas appreciated. Spinal stimulator lead courses superiorly within the paraspinal soft tissues to the lower thoracic spine. There is no significant abnormality of the paraspinal musculature. VASCULATURE: Mild  atherosclerosis of the abdominal aorta and branch vessels. KIDNEYS, URETERS, AND BLADDER: There is an exophytic cyst along the lateral aspect of the right kidney, which measures at least 1.9 cm in diameter. GALLBLADDER AND BILE DUCTS: Cholelithiasis. IMPRESSION: 1. Mild thickening of the skin overlying the stimulator site, possibly postsurgical changes versus cellulitis. No surrounding fluid collection or gas appreciated, limited by streak artifact. 2. Degenerative changes as detailed above, most pronounced at L3-L4 with moderate spinal canal stenosis and moderate right foraminal stenosis. 3. Moderate dextrocurvature of the lumbar spine centered at L2-L3. Electronically signed by: Donnice Mania MD 07/18/2024 06:37 PM EDT RP Workstation: [id number]    CT imaging discussed with Dr. Penne Sharps.   PROCEDURES:  Critical Care performed: No  Procedures   MEDICATIONS ORDERED IN ED: Medications - No data to  display   IMPRESSION / MDM / ASSESSMENT AND PLAN / ED COURSE  I reviewed the triage vital signs and the nursing notes.                              Differential diagnosis includes, but is not limited to, surgical site infection, sepsis, cellulitis, hematoma, seroma, deep tissue infection etc.  Because the patient is borderline renal function noncontrasted imaging utilized, Dr. Claudene advising CT lumbar spine without contrast.  Advises MRI would have too much artifact for reasonable interpretation  She is currently not febrile and her white count is normal.  However exam findings seem concerning for surgical site infection.  Patient's presentation is most consistent with acute complicated illness / injury requiring diagnostic workup.   She has been seen and evaluated by Dr. Claudene of neurosurgery today.   Clinical Course as of 07/18/24 1929  Wed Jul 18, 2024  1807 Awaiting CT result [MQ]    Clinical Course User Index [MQ] Dicky Anes, MD    ----------------------------------------- 7:28 PM on 07/18/2024 ----------------------------------------- Discussed with Dr. Claudene.  Advises is very reasonable course would be to start the patient on IV antibiotic.  Reviewed previous discussed with pharmacy, [facility].  Will start on vancomycin  and cefepime .  Patient does report that she was told she was MRSA positive increased surgical evaluation.  Discussed alternative including oral antibiotics with close outpatient follow-up as an alternative  She is awake alert does not have evidence of sepsis.  High risk surgical site infection.  Neurosurgery will be continue to consult and follow her daily.  Will admit to hospitalist.  Patient and family very agreeable  Patient accepted to hospitalist service by Dr. Lawence  FINAL CLINICAL IMPRESSION(S) / ED DIAGNOSES   Final diagnoses:  Surgical site infection     Rx / DC Orders   ED Discharge Orders     None        Note:  This document was prepared using Dragon voice recognition software and may include unintentional dictation errors.   Dicky Anes, MD 07/18/24 2322

## 2024-07-18 NOTE — Assessment & Plan Note (Signed)
 Will continue statin therapy

## 2024-07-18 NOTE — Telephone Encounter (Signed)
 Patient called back and was in the area and per Kendelyn okay to add on for a nurse visit.

## 2024-07-18 NOTE — Assessment & Plan Note (Signed)
-   Will continue antihypertensive therapy.

## 2024-07-18 NOTE — Progress Notes (Signed)
 I was asked to come and see this patient at request of our nursing team.  Patient states that she does not normally spike fevers with infections.  She has had a nonhealing wound on her arm which was secondary to a dog bite and also has an ongoing healing wound on her lower extremity.  She states that she was reaching back behind her back and noticed drainage coming out of her incision.  She was able to feel it and felt like it was blood.  She came in to be evaluated here in clinic.  There is a significant amount of induration around the incision site and an area of superficial dehiscence, there was some possible purulent drainage expressed when pressure was placed on the surrounding tissue.  She is not having a significant amount of pain at the incision site.  I would like her to go over to the emergency department for expedited imaging to evaluate for any radiographic signs of infection with a lumbar CT scan.  I did also like her to have routine blood draw including CBC and inflammatory markers to evaluate for any chemistry/hematologic signs of infection.  I will continue to follow closely.

## 2024-07-18 NOTE — Progress Notes (Signed)
PHARMACIST - PHYSICIAN ORDER COMMUNICATION  CONCERNING: P&T Medication Policy on Herbal Medications  DESCRIPTION:  This patient's order for:  Zinc  has been noted.  This product(s) is classified as an "herbal" or natural product. Due to a lack of definitive safety studies or FDA approval, nonstandard manufacturing practices, plus the potential risk of unknown drug-drug interactions while on inpatient medications, the Pharmacy and Therapeutics Committee does not permit the use of "herbal" or natural products of this type within [facility].   ACTION TAKEN: The pharmacy department is unable to verify this order at this time and your patient has been informed of this safety policy. Please reevaluate patient's clinical condition at discharge and address if the herbal or natural product(s) should be resumed at that time.

## 2024-07-18 NOTE — Progress Notes (Signed)
 Contacted [facility] for STAT pickup of wound culture. Spoke with Luke. Confirmation # [id number]

## 2024-07-18 NOTE — Telephone Encounter (Signed)
 DOS: 06/22/24  [employer] SCS placement   Can you please call to see if she can send us  a picture in MyChart of her incision? I will send her a message that she can reply to with a picture.   Any fevers or chills? Any increased pain?

## 2024-07-18 NOTE — Progress Notes (Signed)
 Pharmacy Antibiotic Note  Melody Brooks is a 68 y.o. female admitted on 07/18/2024 with cellulitis.  Pharmacy has been consulted for vancomycin  and cefepime  dosing x 7 days.  S/p vancomycin  2.25 g IV x 1 and cefepime  2 g IV x 1  Plan: Start vancomycin  1750 mg IV Q48H. Goal AUC 400-550. Expected AUC: 532 Expected Css min: 10.4 SCr used: 1.73  Weight used: IBW, Vd used: 0.5 (BMI 36.2) Start cefepime  2 g IV Q12H Continue to monitor renal function and follow culture results   Height: 5' 8 (172.7 cm) Weight: 108 kg (238 lb 1.6 oz) IBW/kg (Calculated) : 63.9  Temp (24hrs), Avg:97.8 F (36.6 C), Min:97.6 F (36.4 C), Max:98.2 F (36.8 C)  Recent Labs  Lab 07/18/24 1418  WBC 7.5  CREATININE 1.73*  LATICACIDVEN 1.5    Estimated Creatinine Clearance: 40 mL/min (A) (by C-G formula based on SCr of 1.73 mg/dL (H)).    Allergies  Allergen Reactions   Lisinopril Hives, Itching, Swelling and Rash    Angioedema    Antimicrobials this admission: 9/17 Vanc >>  9/17 Zosyn >>   Microbiology results: 9/17 BCx: IP  Thank you for allowing pharmacy to be a part of this patient's care.  Lum VEAR Mania, PharmD, BCPS 07/18/2024 8:31 PM

## 2024-07-18 NOTE — Assessment & Plan Note (Signed)
-   This is secondary to lumbar spine stimulator surgical site infection. - The patient will be admitted to a medical-surgical bed. - Will continue antibiotic therapy with IV vancomycin  and cefepime . - Warm compresses will be provided. - Pain management will be provided. - Neurosurgery follow-up consult to be obtained. - Dr. Claudene was notified and is aware about the patient.

## 2024-07-18 NOTE — Telephone Encounter (Signed)
 Patient states that the bottom part of her incision started to bleed, and has been bleeding since last night. She said the bleeding on her shirt this am was about the size as a 50 cent piece. The blood was soaking through the bandaid. It has not bled for 2-3 weeks. Please advise if you are wanting to see her in, or what she would need to do?

## 2024-07-18 NOTE — ED Notes (Signed)
 Pt ambulatory to B side hallway bathroom. Gait steady.

## 2024-07-18 NOTE — H&P (Signed)
 [place]   PATIENT NAME: Melody Brooks    MR#:  [id number]  DATE OF BIRTH:  [birth year]  DATE OF ADMISSION:  07/18/2024  PRIMARY CARE PHYSICIAN: Auston Reyes BIRCH, MD   Patient is coming from: Home  REQUESTING/REFERRING PHYSICIAN: Dicky Anes, MD  CHIEF COMPLAINT:   Chief Complaint  Patient presents with   Wound Infection    HISTORY OF PRESENT ILLNESS:  CHANDREA ZELLMAN is a 68 y.o. Caucasian female with medical history significant for osteoarthritis, type diabetes mellitus with peripheral neuropathy, chronic kidney disease, essential hypertension, GERD with hiatal hernia, and lumbar spinal stenosis, who recently underwent implant of spinal nerve stimulator in about a couple of days ago, she started noticing redness around her lower lumbar incision with mild bloody drainage.  This has been getting worse and she was concerned about infected wound.  She denied any fever or chills.  No nausea or vomiting or abdominal pain.  No chest pain or palpitations.  No cough or wheezing or dyspnea.  No dysuria, oliguria or hematuria or flank pain.  She was seen in neurosurgical clinic today and then sent to the emergency room to get a CT scan.  ED Course: When the patient came to the ER, BP was 128/56 with otherwise normal vital signs.  CMP revealed a creatinine of 1.73 with a BUN of 30 close to baseline.  CBC showed hemoglobin 10.9 and hematocrit 34.1 close to baseline.  Blood culture was sent. EKG as reviewed by me : None. Imaging: Lumbar spine CT without contrast revealed the following: 1. Mild thickening of the skin overlying the stimulator site, possibly postsurgical changes versus cellulitis. No surrounding fluid collection or gas appreciated, limited by streak artifact. 2. Degenerative changes as detailed above, most pronounced at L3-L4 with moderate spinal canal stenosis and moderate right foraminal stenosis. 3. Moderate dextrocurvature of the lumbar spine centered at L2-L3.  The  patient was given IV vancomycin  and cefepime .  She will be admitted to a medical-surgical bed for further evaluation and management.   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 pt stated on around October of 2022   Angioedema    Arterial fibromuscular dysplasia (HCC) 07/08/2015   Arthritis    Cancer (HCC)    Carotid stenosis 12/16/2017   Chronic kidney disease    Chronic pain syndrome 03/20/2024   Chronic urticaria 09/10/2019   Constipation    Diabetes mellitus with polyneuropathy (HCC) 07/17/2003   Fatigue    Fibromuscular dysplasia (HCC)    Generalized headaches    GERD (gastroesophageal reflux disease)    Hemorrhoids    Hiatal hernia    Hypertension    Idiopathic scoliosis 07/30/2002   Lumbar facet arthropathy 03/20/2024   Lumbosacral radiculitis 12/11/2014   Neuropathy    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   a.) presurgical PCR (+) 06/13/2024 prior to THORACIC LAMINECTOMY FOR SCS IMPLANTATION   Palpitations    Pneumonia    Rectal bleeding    Rectal pain    Spinal stenosis 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 03/20/2024    PAST SURGICAL HISTORY:   Past Surgical History:  Procedure Laterality Date   Basal cell cancer  removal     colonoscopy  2010   EXCISION/RELEASE BURSA HIP Right 12/23/2022   Procedure: OPEN EXCISION OF RIGHT TROCHANTERIC BURSA;  Surgeon: Edie Norleen PARAS, MD;  Location: [facility] ORS;  Service: Orthopedics;  Laterality: Right;   EXCISIONAL HEMORRHOIDECTOMY  03/2012   IR ANGIO EXTERNAL CAROTID SEL EXT CAROTID BILAT MOD SED  09/04/2021   IR ANGIO INTRA EXTRACRAN SEL INTERNAL CAROTID BILAT MOD SED  09/04/2021   IR ANGIO VERTEBRAL SEL SUBCLAVIAN INNOMINATE UNI L MOD SED  09/04/2021   IR ANGIO VERTEBRAL SEL VERTEBRAL UNI R MOD SED  09/04/2021   IR US  GUIDE VASC ACCESS RIGHT  09/04/2021   right and left eye cataract surgery  2006, 2008   right in 2006  and left 2008   THORACIC LAMINECTOMY FOR SPINAL CORD STIMULATOR N/A 06/22/2024   Procedure: THORACIC LAMINECTOMY FOR SPINAL CORD STIMULATOR;  Surgeon: Clois Fret, MD;  Location: [facility] ORS;  Service: Neurosurgery;  Laterality: N/A;  THORACIC LAMINECTOMY FOR SPINAL CORD STIMULATOR PLACEMENT   TUBAL LIGATION  1995    SOCIAL HISTORY:   Social History   Tobacco Use   Smoking status: Never   Smokeless tobacco: Never   Tobacco comments:    tobacco use- no  Substance Use Topics   Alcohol  use: No    FAMILY HISTORY:   Family History  Problem Relation Age of Onset   Heart failure Mother    COPD Mother    Hypertension Mother    Arthritis Mother    Cancer Father        prostate   Parkinson's disease Father    Colon cancer Father    Cancer Maternal Aunt        colon   Cancer Paternal Grandmother        stomach   Diabetes Brother    Diabetes Brother     DRUG ALLERGIES:   Allergies  Allergen Reactions   Lisinopril Hives, Itching, Swelling and Rash    Angioedema    REVIEW OF SYSTEMS:   ROS As per history of present illness. All pertinent systems were reviewed above. Constitutional, HEENT, cardiovascular, respiratory, GI, GU, musculoskeletal, neuro, psychiatric, endocrine, integumentary and hematologic systems were reviewed and are otherwise negative/unremarkable except for positive findings mentioned above in the HPI.   MEDICATIONS AT HOME:   Prior to Admission medications   Medication Sig Start Date End Date Taking? Authorizing Provider  acetaminophen  (TYLENOL ) 500 MG tablet Take 1,000 mg by mouth every 6 (six) hours as needed for moderate pain.    [provider]  amLODipine  (NORVASC ) 5 MG tablet Take 5 mg by mouth daily. 11/25/23   [provider]  aspirin  81 MG tablet Take 81 mg by mouth daily.    [provider]  Cholecalciferol  (DIALYVITE VITAMIN D  5000) 125 MCG (5000 UT) capsule Take 5,000 Units by mouth daily.    [provider]  collagenase  (SANTYL ) 250 UNIT/GM ointment Apply 1 Application topically daily. 1.1 cm x 0.6 cm x 0.3 cm 07/12/24   Tobie Franky SQUIBB, DPM  Cyanocobalamin  (B-12 IJ) Inject 1,000 Units as directed every 14 (fourteen) days.    [provider]  EPIPEN  2-PAK 0.3 MG/0.3ML SOAJ injection Inject 0.3 mg into the muscle as needed for anaphylaxis. 10/05/15   [provider]  esomeprazole (NEXIUM) 20 MG capsule Take 40 mg by mouth daily at 12 noon.    [provider]  estrogen, conjugated,-medroxyprogesterone (PREMPRO) 0.3-1.5 MG tablet Take 1 tablet by mouth daily.    [provider]  famotidine  (PEPCID ) 20 MG tablet Take 20 mg by mouth at bedtime.    [provider]  ferrous sulfate  325 (65 FE) MG  tablet Take 325 mg by mouth at bedtime.    [provider]  fluticasone -salmeterol (ADVAIR) 250-50 MCG/ACT AEPB Inhale 1 puff into the lungs 2 (two) times daily as needed (chest congestion).    [provider]  furosemide  (LASIX ) 20 MG tablet Take 20 mg by mouth daily.    [provider]  glipiZIDE  (GLUCOTROL  XL) 2.5 MG 24 hr tablet TAKE 1 TABLET BY MOUTH  DAILY WITH BREAKFAST 04/21/20   Chrismon, Marinda E, PA-C  JANUMET  50-1000 MG tablet TAKE 1 TABLET BY MOUTH  TWICE DAILY Patient taking differently: Take 1 tablet by mouth daily. 04/21/20   Chrismon, Marinda BRAVO, PA-C  magnesium  oxide (MAG-OX) 400 (240 Mg) MG tablet Take 400 mg by mouth daily.    [provider]  oxyCODONE  (ROXICODONE ) 5 MG immediate release tablet Take 1 tablet (5 mg total) by mouth every 12 (twelve) hours as needed for severe pain (pain score 7-10). 07/06/24   Hilma Hastings, PA-C  phentermine  (ADIPEX-P ) 37.5 MG tablet Take 37.5 mg by mouth every morning. 05/24/24   [provider]  pioglitazone  (ACTOS ) 45 MG tablet TAKE 1 TABLET BY MOUTH AT  BEDTIME 04/11/20   Chrismon, Marinda BRAVO, PA-C  propranolol  ER (INDERAL  LA) 60 MG 24 hr capsule Take 60 mg by mouth daily.     [provider]  rosuvastatin  (CRESTOR ) 10 MG tablet Take 10 mg by mouth daily. 03/11/21   [provider]  Semaglutide (OZEMPIC, 0.25 OR 0.5 MG/DOSE, [place]) Inject 1 mg into the skin once a week. Mondays    [provider]  spironolactone  (ALDACTONE ) 25 MG tablet Take 25 mg by mouth daily.    [provider]  topiramate  (TOPAMAX ) 50 MG tablet Take 50 mg by mouth 2 (two) times daily. 06/03/24   [provider]  zinc  gluconate 50 MG tablet Take 50 mg by mouth daily.    [provider]      VITAL SIGNS:  Blood pressure (!) 115/46, pulse 84, temperature 98.2 F (36.8 C), temperature source Oral, resp. rate 18, height 5' 8 (1.727 m), weight 108 kg, last menstrual period 03/26/2012, SpO2 100%.  PHYSICAL EXAMINATION:  Physical Exam  GENERAL:  68 y.o.-year-old, Caucasian female patient lying in the bed with no acute distress.  EYES: Pupils equal, round, reactive to light and accommodation. No scleral icterus. Extraocular muscles intact.  HEENT: Head atraumatic, normocephalic. Oropharynx and nasopharynx clear.  NECK:  Supple, no jugular venous distention. No thyroid  enlargement, no tenderness.  LUNGS: Normal breath sounds bilaterally, no wheezing, rales,rhonchi or crepitation. No use of accessory muscles of respiration.  CARDIOVASCULAR: Regular rate and rhythm, S1, S2 normal. No murmurs, rubs, or gallops.  ABDOMEN: Soft, nondistended, nontender. Bowel sounds present. No organomegaly or mass.  EXTREMITIES: No pedal edema, cyanosis, or clubbing.  NEUROLOGIC: Cranial nerves II through XII are intact. Muscle strength 5/5 in all extremities. Sensation intact. Gait not checked.  PSYCHIATRIC: The patient is alert and oriented x 3.  Normal affect and good eye contact. SKIN: Right lower lumbar infected incision with erythematous surrounding area with induration, warmth and tenderness with no fluctuation or purulent discharge.      LABORATORY PANEL:    CBC Recent Labs  Lab 07/18/24 1418  WBC 7.5  HGB 10.9*  HCT 34.1*  PLT 259   ------------------------------------------------------------------------------------------------------------------  Chemistries  Recent Labs  Lab 07/18/24 1418  NA 140  K 4.5  CL 107  CO2 25  GLUCOSE 110*  BUN 30*  CREATININE 1.73*  CALCIUM  9.4  AST 15  ALT 11  ALKPHOS 67  BILITOT 0.6   ------------------------------------------------------------------------------------------------------------------  Cardiac Enzymes No results for input(s): TROPONINI in the last 168 hours. ------------------------------------------------------------------------------------------------------------------  RADIOLOGY:  CT Lumbar Spine Wo Contrast Result Date: 07/18/2024 EXAM: CT OF THE LUMBAR SPINE WITHOUT CONTRAST 07/18/2024 05:05:25 PM TECHNIQUE: CT of the lumbar spine was performed without the administration of intravenous contrast. Multiplanar reformatted images are provided for review. Automated exposure control, iterative reconstruction, and/or weight based adjustment of the mA/kV was utilized to reduce the radiation dose to as low as reasonably achievable. COMPARISON: MRI lumbar spine 09/06/2023 CLINICAL HISTORY: Low back pain, infection or inflammation suspected (Ped 0-17y); Concern for potential infection at surgical site. PT had spinal stimulator placed aug 22n. Last night noticed bleeding from site. Went to [employer] today and was advised to come to ED for possible infection. Pt denies fever and denies pain. FINDINGS: BONES AND ALIGNMENT: Moderate dextrocurvature of the lumbar spine centered at L2-L3 with slight component of rotary subluxation. No acute fracture in the lumbar spine. No destructive osseous lesion. No focal osteopenia or erosive change appreciated. Schmorl's nodes in the lower thoracic spine, particularly at T11. At T12-L1, there is no significant spinal canal or foraminal stenosis.  DEGENERATIVE CHANGES: At L1-2, there is mild disc space narrowing and vacuum disc phenomenon, a small disc bulge, and mild facet arthrosis. No significant spinal canal or foraminal stenosis. At L2-L3, there is mild disc space narrowing and vacuum disc phenomenon. There is a small disc bulge, mild-to-moderate facet arthrosis, and thickening of the ligamentum flavum. There is no significant spinal canal stenosis or foraminal stenosis. At L3-L4, there is moderate disc space narrowing and vacuum disc phenomenon. There is a diffuse disc bulge resulting in lateral recess narrowing, moderate facet arthrosis, and thickening of the ligamentum flavum. There is moderate spinal canal stenosis and moderate foraminal stenosis on the right. At L4-5, there is a diffuse disc bulge and moderate facet arthrosis. Thickening of the ligamentum flavum is present. There is lateral recess narrowing and mild spinal canal stenosis. There is mild foraminal stenosis on the left. At L5-S1, there is mild disc space narrowing and vacuum disc phenomenon, small disc bulge, and moderate facet arthrosis. No significant spinal canal stenosis. There is moderate foraminal stenosis on the left. SOFT TISSUES: Spinal stimulator pack noted within the subcutaneous tissues right of midline over the lumbar spine at the level of L2. There is mild thickening of the skin overlying the stimulator site which may reflect postsurgical changes versus cellulitis. Within the limitations of streak artifact, there is no evidence of surrounding fluid collection. No locules of gas appreciated. Spinal stimulator lead courses superiorly within the paraspinal soft tissues to the lower thoracic spine. There is no significant abnormality of the paraspinal musculature. VASCULATURE: Mild atherosclerosis of the abdominal aorta and branch vessels. KIDNEYS, URETERS, AND BLADDER: There is an exophytic cyst along the lateral aspect of the right kidney, which measures at least 1.9 cm in  diameter. GALLBLADDER AND BILE DUCTS: Cholelithiasis. IMPRESSION: 1. Mild thickening of the skin overlying the stimulator site, possibly postsurgical changes versus cellulitis. No surrounding fluid collection or gas appreciated, limited by streak artifact. 2. Degenerative changes as detailed above, most pronounced at L3-L4 with moderate spinal canal stenosis and moderate right foraminal stenosis. 3. Moderate dextrocurvature of the lumbar spine centered at L2-L3. Electronically signed by: Donnice Mania MD 07/18/2024 06:37 PM EDT RP Workstation: [id number]      IMPRESSION AND PLAN:  Assessment and Plan: *  Cellulitis of back - This is secondary to lumbar spine incision site infection. - The patient will be admitted to a medical-surgical bed. - Will continue antibiotic therapy with IV vancomycin  and cefepime . - Warm compresses will be provided. - Pain management will be provided. - Neurosurgery follow-up consult to be obtained. - Dr. Claudene was notified and is aware about the patient.  Type 2 diabetes mellitus with peripheral neuropathy (HCC) - The patient be placed on supplemental coverage with NovoLog . - Will continue Actos  and Glucotrol  XL. - Will hold off metformin . - Will continue Lyrica .  Dyslipidemia - Will continue statin therapy.  Essential hypertension - Will continue antihypertensive therapy.   DVT prophylaxis: SCDs. Advanced Care Planning:  Code Status: full code.  Neuro Family Communication:  The plan of care was discussed in details with the patient (and family). I answered all questions. The patient agreed to proceed with the above mentioned plan. Further management will depend upon hospital course. Disposition Plan: Back to previous home environment Consults called: Neurosurgery All the records are reviewed and case discussed with ED provider.  Status is: Inpatient  At the time of the admission, it appears that the appropriate admission status for this patient is  inpatient.  This is judged to be reasonable and necessary in order to provide the required intensity of service to ensure the patient's safety given the presenting symptoms, physical exam findings and initial radiographic and laboratory data in the context of comorbid conditions.  The patient requires inpatient status due to high intensity of service, high risk of further deterioration and high frequency of surveillance required.  I certify that at the time of admission, it is my clinical judgment that the patient will require inpatient hospital care extending more than 2 midnights.                            Dispo: The patient is from: Home              Anticipated d/c is to: Home              Patient currently is not medically stable to d/c.              Difficult to place patient: No  Madison DELENA Peaches M.D on 07/18/2024 at 10:18 PM  [facility]   From 7 PM-7 AM, contact night-coverage [email or web address]  CC: Primary care physician; Auston Reyes BIRCH, MD

## 2024-07-18 NOTE — Progress Notes (Signed)
 DOS: 06/22/24 [employer] SCS placement by Dr Clois  Melody Brooks presents today with reports of bleeding and bright red drainage at her pulse generator incision site since last night.  She has also noticed some tenderness, burning, and stinging since last night. She denies fever or chills.  Dr Claudene was present in office at the time of her visit and was brought into the room to evaluate her. A culture was obtained and the patient was advised to present to the emergency room for further work up. Please see Dr Theressa note for further information.  Bradie Lacock, RN [facility] at [facility]

## 2024-07-18 NOTE — Assessment & Plan Note (Signed)
-   The patient be placed on supplemental coverage with NovoLog . - Will continue Actos  and Glucotrol  XL. - Will hold off metformin . - Will continue Lyrica .

## 2024-07-19 ENCOUNTER — Encounter: Payer: Self-pay | Admitting: Family Medicine

## 2024-07-19 DIAGNOSIS — L7682 Other postprocedural complications of skin and subcutaneous tissue: Secondary | ICD-10-CM

## 2024-07-19 DIAGNOSIS — T8149XA Infection following a procedure, other surgical site, initial encounter: Principal | ICD-10-CM

## 2024-07-19 DIAGNOSIS — L03312 Cellulitis of back [any part except buttock]: Secondary | ICD-10-CM | POA: Diagnosis not present

## 2024-07-19 DIAGNOSIS — E1122 Type 2 diabetes mellitus with diabetic chronic kidney disease: Secondary | ICD-10-CM

## 2024-07-19 DIAGNOSIS — T8140XA Infection following a procedure, unspecified, initial encounter: Secondary | ICD-10-CM

## 2024-07-19 DIAGNOSIS — T148XXA Other injury of unspecified body region, initial encounter: Secondary | ICD-10-CM

## 2024-07-19 DIAGNOSIS — I129 Hypertensive chronic kidney disease with stage 1 through stage 4 chronic kidney disease, or unspecified chronic kidney disease: Secondary | ICD-10-CM

## 2024-07-19 DIAGNOSIS — B9562 Methicillin resistant Staphylococcus aureus infection as the cause of diseases classified elsewhere: Secondary | ICD-10-CM

## 2024-07-19 DIAGNOSIS — N189 Chronic kidney disease, unspecified: Secondary | ICD-10-CM

## 2024-07-19 LAB — CBC
HCT: 34 % — ABNORMAL LOW (ref 36.0–46.0)
Hemoglobin: 10.7 g/dL — ABNORMAL LOW (ref 12.0–15.0)
MCH: 30.7 pg (ref 26.0–34.0)
MCHC: 31.5 g/dL (ref 30.0–36.0)
MCV: 97.4 fL (ref 80.0–100.0)
Platelets: 227 K/uL (ref 150–400)
RBC: 3.49 MIL/uL — ABNORMAL LOW (ref 3.87–5.11)
RDW: 14.7 % (ref 11.5–15.5)
WBC: 5.9 K/uL (ref 4.0–10.5)
nRBC: 0 % (ref 0.0–0.2)

## 2024-07-19 LAB — BASIC METABOLIC PANEL WITH GFR
Anion gap: 8 (ref 5–15)
BUN: 28 mg/dL — ABNORMAL HIGH (ref 8–23)
CO2: 21 mmol/L — ABNORMAL LOW (ref 22–32)
Calcium: 8.9 mg/dL (ref 8.9–10.3)
Chloride: 111 mmol/L (ref 98–111)
Creatinine, Ser: 1.73 mg/dL — ABNORMAL HIGH (ref 0.44–1.00)
GFR, Estimated: 32 mL/min — ABNORMAL LOW (ref >60)
Glucose, Bld: 109 mg/dL — ABNORMAL HIGH (ref 70–99)
Potassium: 3.7 mmol/L (ref 3.5–5.1)
Sodium: 140 mmol/L (ref 135–145)

## 2024-07-19 MED ORDER — CYCLOBENZAPRINE HCL 10 MG PO TABS
10.0000 mg | ORAL_TABLET | Freq: Every day | ORAL | Status: DC
  Administered 2024-07-19 – 2024-07-22 (×4): 10 mg via ORAL
  Filled 2024-07-19 (×4): qty 1

## 2024-07-19 MED ORDER — SODIUM CHLORIDE 0.9 % IV SOLN
2.0000 g | INTRAVENOUS | Status: DC
  Administered 2024-07-19 – 2024-07-20 (×2): 2 g via INTRAVENOUS
  Filled 2024-07-19 (×3): qty 20

## 2024-07-19 MED ORDER — ASPIRIN 81 MG PO TBEC
81.0000 mg | DELAYED_RELEASE_TABLET | Freq: Every day | ORAL | Status: DC
  Administered 2024-07-20 – 2024-07-23 (×4): 81 mg via ORAL
  Filled 2024-07-19 (×4): qty 1

## 2024-07-19 MED ORDER — DIPHENHYDRAMINE HCL 25 MG PO CAPS
25.0000 mg | ORAL_CAPSULE | Freq: Two times a day (BID) | ORAL | Status: DC
Start: 2024-07-19 — End: 2024-07-20
  Administered 2024-07-19 – 2024-07-20 (×2): 25 mg via ORAL
  Filled 2024-07-19 (×2): qty 1

## 2024-07-19 MED ORDER — ZOLPIDEM TARTRATE 5 MG PO TABS
5.0000 mg | ORAL_TABLET | Freq: Every day | ORAL | Status: DC
  Administered 2024-07-19 – 2024-07-22 (×4): 5 mg via ORAL
  Filled 2024-07-19 (×4): qty 1

## 2024-07-19 MED ORDER — LINEZOLID 600 MG/300ML IV SOLN
600.0000 mg | Freq: Two times a day (BID) | INTRAVENOUS | Status: AC
  Administered 2024-07-19 – 2024-07-23 (×8): 600 mg via INTRAVENOUS
  Filled 2024-07-19 (×8): qty 300

## 2024-07-19 MED ORDER — LORAZEPAM 2 MG PO TABS
2.0000 mg | ORAL_TABLET | Freq: Every evening | ORAL | Status: DC | PRN
Start: 2024-07-19 — End: 2024-07-23

## 2024-07-19 MED ORDER — PREGABALIN 50 MG PO CAPS
100.0000 mg | ORAL_CAPSULE | Freq: Two times a day (BID) | ORAL | Status: DC
  Administered 2024-07-19 – 2024-07-23 (×8): 100 mg via ORAL
  Filled 2024-07-19 (×8): qty 2

## 2024-07-19 MED ORDER — POLYETHYLENE GLYCOL 3350 17 G PO PACK
17.0000 g | PACK | Freq: Every day | ORAL | Status: DC
  Administered 2024-07-23: 17 g via ORAL
  Filled 2024-07-19 (×3): qty 1

## 2024-07-19 MED ORDER — ZINC SULFATE 220 (50 ZN) MG PO CAPS
220.0000 mg | ORAL_CAPSULE | Freq: Every day | ORAL | Status: DC
  Administered 2024-07-20 – 2024-07-23 (×4): 220 mg via ORAL
  Filled 2024-07-19 (×4): qty 1

## 2024-07-19 MED ORDER — PREDNISONE 10 MG PO TABS: 10.0000 mg | ORAL_TABLET | ORAL | Status: DC | PRN

## 2024-07-19 MED ORDER — MORPHINE SULFATE (PF) 2 MG/ML IV SOLN: 2.0000 mg | INTRAVENOUS | Status: DC | PRN

## 2024-07-19 NOTE — ED Notes (Signed)
 Meal provided

## 2024-07-19 NOTE — Consult Note (Signed)
 Consulting Department:  Urgency department  Primary Physician:  Auston Reyes BIRCH, MD  Chief Complaint: Wound dehiscence  History of Present Illness: 07/19/2024 Melody Brooks is a 68 y.o. female who presents with the chief complaint of wound dehiscence.  She presented to the emergency department yesterday with concerns for wound infection.  Drainage was swabbed and sent for culture.  She is not having any fevers chills or any other constitutional symptoms.  She has started on antibiotics, she was never having a significant amount of pain with the drainage.  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  Open wound on the arm is caused by dog scratches.  Open wound on the foot is diabetic in nature.   Review of Systems:  A 10 point review of systems is negative, except for the pertinent positives and negatives detailed in the HPI.  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 pt stated on around October of 2022   Angioedema    Arterial fibromuscular dysplasia (HCC) 07/08/2015   Arthritis    Cancer (HCC)    Carotid stenosis 12/16/2017   Chronic kidney disease    Chronic pain syndrome 03/20/2024   Chronic urticaria 09/10/2019   Constipation    Diabetes mellitus with polyneuropathy (HCC) 07/17/2003   Fatigue    Fibromuscular dysplasia (HCC)    Generalized headaches    GERD (gastroesophageal reflux disease)    Hemorrhoids    Hiatal hernia    Hypertension    Idiopathic scoliosis 07/30/2002   Lumbar facet arthropathy 03/20/2024   Lumbosacral radiculitis 12/11/2014   Neuropathy    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   a.) presurgical PCR (+) 06/13/2024 prior to THORACIC LAMINECTOMY FOR SCS IMPLANTATION   Palpitations    Pneumonia    Rectal bleeding    Rectal pain    Spinal stenosis of lumbar region  without neurogenic claudication 03/20/2024    Past Surgical History: Past Surgical History:  Procedure Laterality Date   Basal cell cancer  removal     colonoscopy  2010   EXCISION/RELEASE BURSA HIP Right 12/23/2022   Procedure: OPEN EXCISION OF RIGHT TROCHANTERIC BURSA;  Surgeon: Edie Norleen PARAS, MD;  Location: [facility];  Service: Orthopedics;  Laterality: Right;   EXCISIONAL HEMORRHOIDECTOMY  03/2012   IR ANGIO EXTERNAL CAROTID SEL EXT CAROTID BILAT MOD SED  09/04/2021   IR ANGIO INTRA EXTRACRAN SEL INTERNAL CAROTID BILAT MOD SED  09/04/2021   IR ANGIO VERTEBRAL SEL SUBCLAVIAN INNOMINATE UNI L MOD SED  09/04/2021   IR ANGIO VERTEBRAL SEL VERTEBRAL UNI R MOD SED  09/04/2021   IR US  GUIDE VASC ACCESS RIGHT  09/04/2021   right and left eye cataract surgery  2006, 2008   right in 2006 and left 2008   THORACIC LAMINECTOMY FOR SPINAL CORD STIMULATOR N/A 06/22/2024   Procedure: THORACIC LAMINECTOMY FOR SPINAL CORD STIMULATOR;  Surgeon: Clois Fret, MD;  Location: [facility];  Service: Neurosurgery;  Laterality: N/A;  THORACIC LAMINECTOMY FOR SPINAL CORD STIMULATOR PLACEMENT   TUBAL LIGATION  1995    Allergies: Allergies as of 07/18/2024 - Review Complete 07/18/2024  Allergen Reaction Noted   Lisinopril Hives, Itching, Swelling, and Rash 03/12/2015    Medications:  Current Facility-Administered Medications:    0.9 %  sodium chloride  infusion, ,  Intravenous, Continuous, Mansy, Jan A, MD, Last Rate: 100 mL/hr at 07/19/24 0507, Rate Verify at 07/19/24 0507   acetaminophen  (TYLENOL ) tablet 650 mg, 650 mg, Oral, Q6H PRN **OR** acetaminophen  (TYLENOL ) suppository 650 mg, 650 mg, Rectal, Q6H PRN, Mansy, Jan A, MD   amLODipine  (NORVASC ) tablet 5 mg, 5 mg, Oral, Daily, Mansy, Jan A, MD, 5 mg at 07/19/24 9056   ceFEPIme  (MAXIPIME ) 2 g in sodium chloride  0.9 % 100 mL IVPB, 2 g, Intravenous, Q12H, Perley Lum DEL, RPH, Stopped at 07/19/24 9052   cholecalciferol  (VITAMIN D3) tablet 5,000 Units, 5,000  Units, Oral, Daily, Mansy, Jan A, MD, 5,000 Units at 07/19/24 9056   EPINEPHrine  (EPI-PEN) injection 0.3 mg, 0.3 mg, Intramuscular, PRN, Mansy, Jan A, MD   estrogen (conjugated)-medroxyprogesterone (PREMPRO) 0.3-1.5 MG per tablet 1 tablet, 1 tablet, Oral, Daily, Mansy, Jan A, MD   famotidine  (PEPCID ) tablet 20 mg, 20 mg, Oral, QHS, Mansy, Jan A, MD   ferrous sulfate  tablet 325 mg, 325 mg, Oral, QHS, Mansy, Jan A, MD   furosemide  (LASIX ) tablet 20 mg, 20 mg, Oral, Daily, Mansy, Jan A, MD, 20 mg at 07/19/24 9057   glipiZIDE  (GLUCOTROL  XL) 24 hr tablet 2.5 mg, 2.5 mg, Oral, Q breakfast, Mansy, Jan A, MD, 2.5 mg at 07/19/24 0744   leptospermum manuka honey (MEDIHONEY) paste 1 Application, 1 Application, Topical, Daily, Mansy, Jan A, MD, 1 Application at 07/19/24 9053   linagliptin  (TRADJENTA ) tablet 5 mg, 5 mg, Oral, Q breakfast, 5 mg at 07/19/24 0744 **AND** metFORMIN  (GLUCOPHAGE ) tablet 1,000 mg, 1,000 mg, Oral, Q breakfast, Hunt, Madison H, RPH, 1,000 mg at 07/19/24 9257   magnesium  hydroxide (MILK OF MAGNESIA) suspension 30 mL, 30 mL, Oral, Daily PRN, Mansy, Jan A, MD   magnesium  oxide (MAG-OX) tablet 400 mg, 400 mg, Oral, Daily, Mansy, Jan A, MD, 400 mg at 07/19/24 0945   morphine  (PF) 2 MG/ML injection 2 mg, 2 mg, Intravenous, Q4H PRN, Mansy, Jan A, MD   ondansetron  (ZOFRAN ) tablet 4 mg, 4 mg, Oral, Q6H PRN **OR** ondansetron  (ZOFRAN ) injection 4 mg, 4 mg, Intravenous, Q6H PRN, Mansy, Jan A, MD   oxyCODONE  (Oxy IR/ROXICODONE ) immediate release tablet 5 mg, 5 mg, Oral, Q12H PRN, Mansy, Jan A, MD   pantoprazole  (PROTONIX ) EC tablet 40 mg, 40 mg, Oral, Daily, Mansy, Jan A, MD, 40 mg at 07/19/24 9057   phentermine  (ADIPEX-P ) tablet 37.5 mg, 37.5 mg, Oral, q morning, Mansy, Jan A, MD   pioglitazone  (ACTOS ) tablet 45 mg, 45 mg, Oral, QHS, Mansy, Jan A, MD, 45 mg at 07/18/24 2231   propranolol  ER (INDERAL  LA) 24 hr capsule 60 mg, 60 mg, Oral, Daily, Mansy, Jan A, MD, 60 mg at 07/19/24 9057    rosuvastatin  (CRESTOR ) tablet 10 mg, 10 mg, Oral, Daily, Mansy, Jan A, MD, 10 mg at 07/19/24 9058   spironolactone  (ALDACTONE ) tablet 25 mg, 25 mg, Oral, Daily, Mansy, Jan A, MD, 25 mg at 07/19/24 0941   topiramate  (TOPAMAX ) tablet 50 mg, 50 mg, Oral, BID, Mansy, Jan A, MD, 50 mg at 07/19/24 9056   traZODone  (DESYREL ) tablet 25 mg, 25 mg, Oral, QHS PRN, Mansy, Jan A, MD   [START ON 07/20/2024] vancomycin  (VANCOREADY) IVPB 1750 mg/350 mL, 1,750 mg, Intravenous, Q48H, Hunt, Madison H, RPH  Current Outpatient Medications:    acetaminophen  (TYLENOL ) 500 MG tablet, Take 1,000 mg by mouth every 6 (six) hours as needed for moderate pain., Disp: , Rfl:    amLODipine  (NORVASC ) 5 MG tablet, Take 5  mg by mouth daily., Disp: , Rfl:    aspirin  81 MG tablet, Take 81 mg by mouth daily., Disp: , Rfl:    Cholecalciferol  (DIALYVITE VITAMIN D  5000) 125 MCG (5000 UT) capsule, Take 5,000 Units by mouth daily., Disp: , Rfl:    collagenase  (SANTYL ) 250 UNIT/GM ointment, Apply 1 Application topically daily. 1.1 cm x 0.6 cm x 0.3 cm, Disp: 30 g, Rfl: 0   Cyanocobalamin  (B-12 IJ), Inject 1,000 Units as directed every 14 (fourteen) days., Disp: , Rfl:    cyclobenzaprine  (FLEXERIL ) 10 MG tablet, Take 10 mg by mouth at bedtime., Disp: , Rfl:    diphenhydrAMINE  (BENADRYL ) 25 mg capsule, Take 25 mg by mouth 2 (two) times daily., Disp: , Rfl:    EPIPEN  2-PAK 0.3 MG/0.3ML SOAJ injection, Inject 0.3 mg into the muscle as needed for anaphylaxis., Disp: , Rfl:    esomeprazole (NEXIUM) 20 MG capsule, Take 40 mg by mouth daily at 12 noon., Disp: , Rfl:    estrogen, conjugated,-medroxyprogesterone (PREMPRO) 0.3-1.5 MG tablet, Take 1 tablet by mouth daily., Disp: , Rfl:    famotidine  (PEPCID ) 20 MG tablet, Take 20 mg by mouth at bedtime., Disp: , Rfl:    ferrous sulfate  325 (65 FE) MG tablet, Take 325 mg by mouth at bedtime., Disp: , Rfl:    fluticasone -salmeterol (ADVAIR) 250-50 MCG/ACT AEPB, Inhale 1 puff into the lungs 2 (two) times  daily as needed (chest congestion)., Disp: , Rfl:    furosemide  (LASIX ) 20 MG tablet, Take 20 mg by mouth daily., Disp: , Rfl:    glipiZIDE  (GLUCOTROL  XL) 2.5 MG 24 hr tablet, TAKE 1 TABLET BY MOUTH  DAILY WITH BREAKFAST, Disp: 90 tablet, Rfl: 2   JANUMET  50-1000 MG tablet, TAKE 1 TABLET BY MOUTH  TWICE DAILY (Patient taking differently: Take 1 tablet by mouth daily.), Disp: 180 tablet, Rfl: 2   LORazepam  (ATIVAN ) 2 MG tablet, Take 2 mg by mouth as needed., Disp: , Rfl:    magnesium  oxide (MAG-OX) 400 (240 Mg) MG tablet, Take 400 mg by mouth daily., Disp: , Rfl:    OZEMPIC, 1 MG/DOSE, 4 MG/3ML SOPN, Inject 0.75 mLs into the skin once a week., Disp: , Rfl:    pioglitazone  (ACTOS ) 45 MG tablet, TAKE 1 TABLET BY MOUTH AT  BEDTIME, Disp: 60 tablet, Rfl: 5   predniSONE  (DELTASONE ) 10 MG tablet, Take 10 mg by mouth as needed., Disp: , Rfl:    pregabalin  (LYRICA ) 100 MG capsule, Take 1 capsule by mouth 2 (two) times daily., Disp: , Rfl:    propranolol  ER (INDERAL  LA) 60 MG 24 hr capsule, Take 60 mg by mouth daily., Disp: , Rfl:    rosuvastatin  (CRESTOR ) 10 MG tablet, Take 10 mg by mouth daily., Disp: , Rfl:    spironolactone  (ALDACTONE ) 25 MG tablet, Take 25 mg by mouth daily., Disp: , Rfl:    topiramate  (TOPAMAX ) 50 MG tablet, Take 50 mg by mouth 2 (two) times daily., Disp: , Rfl:    zinc  gluconate 50 MG tablet, Take 50 mg by mouth daily., Disp: , Rfl:    zolpidem  (AMBIEN ) 5 MG tablet, Take 5 mg by mouth at bedtime., Disp: , Rfl:    oxyCODONE  (ROXICODONE ) 5 MG immediate release tablet, Take 1 tablet (5 mg total) by mouth every 12 (twelve) hours as needed for severe pain (pain score 7-10). (Patient not taking: Reported on 07/18/2024), Disp: 10 tablet, Rfl: 0   phentermine  (ADIPEX-P ) 37.5 MG tablet, Take 37.5 mg by mouth every  morning., Disp: , Rfl:    Social History: Social History   Tobacco Use   Smoking status: Never   Smokeless tobacco: Never   Tobacco comments:    tobacco use- no  Vaping Use    Vaping status: Never Used  Substance Use Topics   Alcohol  use: No   Drug use: No    Family Medical History: Family History  Problem Relation Age of Onset   Heart failure Mother    COPD Mother    Hypertension Mother    Arthritis Mother    Cancer Father        prostate   Parkinson's disease Father    Colon cancer Father    Cancer Maternal Aunt        colon   Cancer Paternal Grandmother        stomach   Diabetes Brother    Diabetes Brother     Physical Examination: Vitals:   07/19/24 0900 07/19/24 0942  BP: (!) 163/75 (!) 163/75  Pulse: 73   Resp: 19   Temp:    SpO2: 100%      General: Patient is in no acute distress.  I examined her incisional area, shows to have less drainage than it did yesterday.  The redness and swelling appears to be slightly improved from images taken in clinic yesterday.  No fluctuance or palpable fluid collection.  No active drainage noted at this time, appears to have some superficial granulation type tissue.  Notably she was h a significant amount of active drainage yesterday in clinic which is documented on some photographs in her media tab.   Imaging: CT Lumbar Spine Wo Contrast Result Date: 07/18/2024 EXAM: CT OF THE LUMBAR SPINE WITHOUT CONTRAST 07/18/2024 05:05:25 PM TECHNIQUE: CT of the lumbar spine was performed without the administration of intravenous contrast. Multiplanar reformatted images are provided for review. Automated exposure control, iterative reconstruction, and/or weight based adjustment of the mA/kV was utilized to reduce the radiation dose to as low as reasonably achievable. COMPARISON: MRI lumbar spine 09/06/2023 CLINICAL HISTORY: Low back pain, infection or inflammation suspected (Ped 0-17y); Concern for potential infection at surgical site. PT had spinal stimulator placed aug 22n. Last night noticed bleeding from site. Went to [employer] today and was advised to come to ED for possible infection. Pt denies fever and  denies pain. FINDINGS: BONES AND ALIGNMENT: Moderate dextrocurvature of the lumbar spine centered at L2-L3 with slight component of rotary subluxation. No acute fracture in the lumbar spine. No destructive osseous lesion. No focal osteopenia or erosive change appreciated. Schmorl's nodes in the lower thoracic spine, particularly at T11. At T12-L1, there is no significant spinal canal or foraminal stenosis. DEGENERATIVE CHANGES: At L1-2, there is mild disc space narrowing and vacuum disc phenomenon, a small disc bulge, and mild facet arthrosis. No significant spinal canal or foraminal stenosis. At L2-L3, there is mild disc space narrowing and vacuum disc phenomenon. There is a small disc bulge, mild-to-moderate facet arthrosis, and thickening of the ligamentum flavum. There is no significant spinal canal stenosis or foraminal stenosis. At L3-L4, there is moderate disc space narrowing and vacuum disc phenomenon. There is a diffuse disc bulge resulting in lateral recess narrowing, moderate facet arthrosis, and thickening of the ligamentum flavum. There is moderate spinal canal stenosis and moderate foraminal stenosis on the right. At L4-5, there is a diffuse disc bulge and moderate facet arthrosis. Thickening of the ligamentum flavum is present. There is lateral recess narrowing and mild spinal canal stenosis.  There is mild foraminal stenosis on the left. At L5-S1, there is mild disc space narrowing and vacuum disc phenomenon, small disc bulge, and moderate facet arthrosis. No significant spinal canal stenosis. There is moderate foraminal stenosis on the left. SOFT TISSUES: Spinal stimulator pack noted within the subcutaneous tissues right of midline over the lumbar spine at the level of L2. There is mild thickening of the skin overlying the stimulator site which may reflect postsurgical changes versus cellulitis. Within the limitations of streak artifact, there is no evidence of surrounding fluid collection. No  locules of gas appreciated. Spinal stimulator lead courses superiorly within the paraspinal soft tissues to the lower thoracic spine. There is no significant abnormality of the paraspinal musculature. VASCULATURE: Mild atherosclerosis of the abdominal aorta and branch vessels. KIDNEYS, URETERS, AND BLADDER: There is an exophytic cyst along the lateral aspect of the right kidney, which measures at least 1.9 cm in diameter. GALLBLADDER AND BILE DUCTS: Cholelithiasis. IMPRESSION: 1. Mild thickening of the skin overlying the stimulator site, possibly postsurgical changes versus cellulitis. No surrounding fluid collection or gas appreciated, limited by streak artifact. 2. Degenerative changes as detailed above, most pronounced at L3-L4 with moderate spinal canal stenosis and moderate right foraminal stenosis. 3. Moderate dextrocurvature of the lumbar spine centered at L2-L3. Electronically signed by: Donnice Mania MD 07/18/2024 06:37 PM EDT RP Workstation: [id number]     I have personally reviewed the images and agree with the above interpretation.  Labs:    Latest Ref Rng & Units 07/19/2024    5:36 AM 07/18/2024    2:18 PM 06/26/2024    9:43 AM  CBC  WBC 4.0 - 10.5 K/uL 5.9  7.5  8.4   Hemoglobin 12.0 - 15.0 g/dL 89.2  89.0  89.0   Hematocrit 36.0 - 46.0 % 34.0  34.1  33.7   Platelets 150 - 400 K/uL 227  259  195       Latest Ref Rng & Units 07/19/2024    5:36 AM 07/18/2024    2:18 PM 06/26/2024    9:43 AM  BMP  Glucose 70 - 99 mg/dL 890  889  854   BUN 8 - 23 mg/dL 28  30  29    Creatinine 0.44 - 1.00 mg/dL 8.26  8.26  8.37   Sodium 135 - 145 mmol/L 140  140  137   Potassium 3.5 - 5.1 mmol/L 3.7  4.5  4.1   Chloride 98 - 111 mmol/L 111  107  105   CO2 22 - 32 mmol/L 21  25  23    Calcium  8.9 - 10.3 mg/dL 8.9  9.4  9.2         Assessment and Plan: Ms. Empie is a pleasant 68 y.o. female with history of a spinal cord stimulator placement.  She is at high risk for a postoperative infection with  a open foot wound as well as a open arm wound from her dog.  There is some erythema noted around the arm wound as well.  She came in because she noticed drainage from her incision.  The implant was placed on 8/22 of this year.  She did not notice any constitutional symptoms.  She was sent to the emergency department for evaluation, did not have an elevated white count.  No fevers.  We are awaiting tissue culture/swab culture results for speciation or determination of infection.  At this point given the fact that there is no significant fluid collection noted by the implant we  could consider antibiotic trial to see whether or not we can keep her from needing removal of her stimulator.  We appreciate guidance from medicine as well as infectious disease.  If comfortable with the plan we will continue to follow her incision closely.  If she could be discharged home for outpatient management we could see her as early as Monday for another wound check to see whether or not the drainage has improved or stopped altogether.  I did talk to them about if they were to discharge that if they had any opening of the incision or worsening of the drainage that she did have to come back and likely have it removed at that time.  Penne MICAEL Sharps, MD/MSCR Dept. of Neurosurgery

## 2024-07-19 NOTE — Consult Note (Signed)
 NAME: Melody Brooks  DOB: 1956-07-17  MRN: [id number]  Date/Time: 07/19/2024 4:32 PM  REQUESTING PROVIDER: Dr Franchot Subjective:  REASON FOR CONSULT: surgical site infection  [name] is a 68 y.o. with a history of CKD, diabetes mellitus, peripheral neuropathy hypertension, chronic rt foot wound lumbosacral radiculitis pain for which she underwent Permanent spinal cord stimulator placement on 06/22/2024 the level of T7-T8 disc space with removal of the spinous process at T9.  There was also flank pocket created with the pulse generator on the right side.  She was hospitalized at [facility] between 8 /266-8 27 for acute metabolic encephalopathy thought to be secondary to polypharmacy secondary to the Robaxin  and oxycodone ..  Follows podiatry Dr. Tobie for chronic right foot ulcer and was recently given doxycycline  on 07/12/2024 presents to the ED on 07/18/24 after being sent from neurosurgery office for purulent drainage from the surgical scar at lumbar generator site She has no fever or chills  07/18/24 14:09  BP 153/79 !  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 pt stated on around October of 2022   Angioedema    Arterial fibromuscular dysplasia (HCC) 07/08/2015   Arthritis    Cancer (HCC)    Carotid stenosis 12/16/2017   Chronic kidney disease    Chronic pain syndrome 03/20/2024   Chronic urticaria 09/10/2019   Constipation    Diabetes mellitus with polyneuropathy (HCC) 07/17/2003   Fatigue    Fibromuscular  dysplasia (HCC)    Generalized headaches    GERD (gastroesophageal reflux disease)    Hemorrhoids    Hiatal hernia    Hypertension    Idiopathic scoliosis 07/30/2002   Lumbar facet arthropathy 03/20/2024   Lumbosacral radiculitis 12/11/2014   Neuropathy    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   a.) presurgical PCR (+) 06/13/2024 prior to THORACIC LAMINECTOMY FOR SCS IMPLANTATION   Palpitations    Pneumonia    Rectal bleeding    Rectal pain    Spinal stenosis 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 03/20/2024    Past Surgical History:  Procedure Laterality Date   Basal cell cancer  removal     colonoscopy  2010   EXCISION/RELEASE BURSA HIP Right 12/23/2022   Procedure: OPEN EXCISION OF RIGHT TROCHANTERIC BURSA;  Surgeon: Edie Norleen PARAS, MD;  Location: [facility] ORS;  Service: Orthopedics;  Laterality: Right;   EXCISIONAL HEMORRHOIDECTOMY  03/2012   IR ANGIO EXTERNAL CAROTID SEL EXT CAROTID BILAT MOD SED  09/04/2021   IR ANGIO INTRA EXTRACRAN SEL INTERNAL CAROTID BILAT MOD SED  09/04/2021   IR ANGIO VERTEBRAL SEL SUBCLAVIAN INNOMINATE UNI L MOD SED  09/04/2021   IR ANGIO VERTEBRAL SEL VERTEBRAL UNI R MOD SED  09/04/2021   IR US  GUIDE VASC ACCESS RIGHT  09/04/2021   right and left eye cataract surgery  2006, 2008   right in 2006 and  left 2008   THORACIC LAMINECTOMY FOR SPINAL CORD STIMULATOR N/A 06/22/2024   Procedure: THORACIC LAMINECTOMY FOR SPINAL CORD STIMULATOR;  Surgeon: Clois Fret, MD;  Location: [facility] ORS;  Service: Neurosurgery;  Laterality: N/A;  THORACIC LAMINECTOMY FOR SPINAL CORD STIMULATOR PLACEMENT   TUBAL LIGATION  1995    Social History   Socioeconomic History   Marital status: Married    Spouse name: Christopher   Number of children: Not on file   Years of education: Not on file   Highest education level: Not on file  Occupational History   Not on file  Tobacco Use   Smoking status: Never   Smokeless tobacco: Never   Tobacco comments:    tobacco use- no   Vaping Use   Vaping status: Never Used  Substance and Sexual Activity   Alcohol  use: No   Drug use: No   Sexual activity: Not on file  Other Topics Concern   Not on file  Social History Narrative   Full time. Married. Regularly exercises- walks.    Social Drivers of Corporate investment banker Strain: Low Risk  (04/17/2024)   Received from [facility]   Overall Financial Resource Strain (CARDIA)    Difficulty of Paying Living Expenses: Not hard at all  Food Insecurity: No Food Insecurity (07/19/2024)   Hunger Vital Sign    Worried About Running Out of Food in the Last Year: Never true    Ran Out of Food in the Last Year: Never true  Transportation Needs: No Transportation Needs (07/19/2024)   PRAPARE - Administrator, Civil Service (Medical): No    Lack of Transportation (Non-Medical): No  Physical Activity: Not on file  Stress: Not on file  Social Connections: Unknown (07/19/2024)   Social Connection and Isolation Panel    Frequency of Communication with Friends and Family: More than three times a week    Frequency of Social Gatherings with Friends and Family: More than three times a week    Attends Religious Services: Patient declined    Database administrator or Organizations: Patient declined    Attends Banker Meetings: Patient declined    Marital Status: Married  Catering manager Violence: Not At Risk (07/19/2024)   Humiliation, Afraid, Rape, and Kick questionnaire    Fear of Current or Ex-Partner: No    Emotionally Abused: No    Physically Abused: No    Sexually Abused: No    Family History  Problem Relation Age of Onset   Heart failure Mother    COPD Mother    Hypertension Mother    Arthritis Mother    Cancer Father        prostate   Parkinson's disease Father    Colon cancer Father    Cancer Maternal Aunt        colon   Cancer Paternal Grandmother        stomach   Diabetes Brother    Diabetes Brother     Allergies  Allergen Reactions   Lisinopril Hives, Itching, Swelling and Rash    Angioedema   I? Current Facility-Administered Medications  Medication Dose Route Frequency Provider Last Rate Last Admin   0.9 %  sodium chloride  infusion   Intravenous Continuous Mansy, Jan A, MD 100 mL/hr at 07/19/24 0507 Rate Verify at 07/19/24 0507   acetaminophen  (TYLENOL ) tablet 650 mg  650 mg Oral Q6H PRN Mansy, Madison LABOR, MD       Or  acetaminophen  (TYLENOL ) suppository 650 mg  650 mg Rectal Q6H PRN Mansy, Jan A, MD       amLODipine  (NORVASC ) tablet 5 mg  5 mg Oral Daily Mansy, Jan A, MD   5 mg at 07/19/24 0943   ceFEPIme  (MAXIPIME ) 2 g in sodium chloride  0.9 % 100 mL IVPB  2 g Intravenous Q12H Perley Lum DEL, [place]   Stopped at 07/19/24 9052   cholecalciferol  (VITAMIN D3) tablet 5,000 Units  5,000 Units Oral Daily Mansy, Jan A, MD   5,000 Units at 07/19/24 9056   EPINEPHrine  (EPI-PEN) injection 0.3 mg  0.3 mg Intramuscular PRN Mansy, Jan A, MD       estrogen (conjugated)-medroxyprogesterone (PREMPRO) 0.3-1.5 MG per tablet 1 tablet  1 tablet Oral Daily Mansy, Jan A, MD       famotidine  (PEPCID ) tablet 20 mg  20 mg Oral QHS Mansy, Jan A, MD       ferrous sulfate  tablet 325 mg  325 mg Oral QHS Mansy, Jan A, MD       furosemide  (LASIX ) tablet 20 mg  20 mg Oral Daily Mansy, Jan A, MD   20 mg at 07/19/24 9057   glipiZIDE  (GLUCOTROL  XL) 24 hr tablet 2.5 mg  2.5 mg Oral Q breakfast Mansy, Jan A, MD   2.5 mg at 07/19/24 0744   leptospermum manuka honey (MEDIHONEY) paste 1 Application  1 Application Topical Daily Mansy, Jan A, MD   1 Application at 07/19/24 0946   linagliptin  (TRADJENTA ) tablet 5 mg  5 mg Oral Q breakfast Perley Lum DEL, RPH   5 mg at 07/19/24 9255   And   metFORMIN  (GLUCOPHAGE ) tablet 1,000 mg  1,000 mg Oral Q breakfast Perley Lum DEL, RPH   1,000 mg at 07/19/24 9257   magnesium  hydroxide (MILK OF MAGNESIA) suspension 30 mL  30 mL Oral Daily PRN Mansy, Jan A, MD       magnesium  oxide (MAG-OX)  tablet 400 mg  400 mg Oral Daily Mansy, Jan A, MD   400 mg at 07/19/24 0945   morphine  (PF) 2 MG/ML injection 2 mg  2 mg Intravenous Q4H PRN Mansy, Jan A, MD       ondansetron  (ZOFRAN ) tablet 4 mg  4 mg Oral Q6H PRN Mansy, Jan A, MD       Or   ondansetron  (ZOFRAN ) injection 4 mg  4 mg Intravenous Q6H PRN Mansy, Jan A, MD       oxyCODONE  (Oxy IR/ROXICODONE ) immediate release tablet 5 mg  5 mg Oral Q12H PRN Mansy, Jan A, MD       pantoprazole  (PROTONIX ) EC tablet 40 mg  40 mg Oral Daily Mansy, Jan A, MD   40 mg at 07/19/24 9057   phentermine  (ADIPEX-P ) tablet 37.5 mg  37.5 mg Oral q morning Mansy, Jan A, MD       pioglitazone  (ACTOS ) tablet 45 mg  45 mg Oral QHS Mansy, Jan A, MD   45 mg at 07/18/24 2231   propranolol  ER (INDERAL  LA) 24 hr capsule 60 mg  60 mg Oral Daily Mansy, Jan A, MD   60 mg at 07/19/24 0942   rosuvastatin  (CRESTOR ) tablet 10 mg  10 mg Oral Daily Mansy, Jan A, MD   10 mg at 07/19/24 0941   spironolactone  (ALDACTONE ) tablet 25 mg  25 mg Oral Daily Mansy, Jan A, MD   25 mg at 07/19/24 0941   topiramate  (TOPAMAX ) tablet 50 mg  50 mg Oral BID Mansy,  Madison LABOR, MD   50 mg at 07/19/24 0943   traZODone  (DESYREL ) tablet 25 mg  25 mg Oral QHS PRN Mansy, Jan A, MD       [START ON 07/20/2024] vancomycin  (VANCOREADY) IVPB 1750 mg/350 mL  1,750 mg Intravenous Q48H Hunt, Madison H, RPH         Abtx:  Anti-infectives (From admission, onward)    Start     Dose/Rate Route Frequency Ordered Stop   07/20/24 2000  vancomycin  (VANCOREADY) IVPB 1750 mg/350 mL        1,750 mg 175 mL/hr over 120 Minutes Intravenous Every 48 hours 07/18/24 2037 07/26/24 1959   07/19/24 0800  ceFEPIme  (MAXIPIME ) 2 g in sodium chloride  0.9 % 100 mL IVPB        2 g 200 mL/hr over 30 Minutes Intravenous Every 12 hours 07/18/24 2031 07/25/24 1959   07/18/24 2030  vancomycin  (VANCOCIN ) IVPB 1000 mg/200 mL premix  Status:  Discontinued        1,000 mg 200 mL/hr over 60 Minutes Intravenous  Once 07/18/24 2029 07/18/24 2030    07/18/24 2030  ceFEPIme  (MAXIPIME ) 2 g in sodium chloride  0.9 % 100 mL IVPB  Status:  Discontinued        2 g 200 mL/hr over 30 Minutes Intravenous  Once 07/18/24 2029 07/18/24 2035   07/18/24 1945  ceFEPIme  (MAXIPIME ) 2 g in sodium chloride  0.9 % 100 mL IVPB        2 g 200 mL/hr over 30 Minutes Intravenous  Once 07/18/24 1930 07/18/24 2049   07/18/24 1945  vancomycin  (VANCOCIN ) IVPB 1000 mg/200 mL premix       Placed in And Linked Group   1,000 mg 200 mL/hr over 60 Minutes Intravenous  Once 07/18/24 1930 07/18/24 2230   07/18/24 1945  vancomycin  (VANCOREADY) IVPB 1250 mg/250 mL       Placed in And Linked Group   1,250 mg 166.7 mL/hr over 90 Minutes Intravenous  Once 07/18/24 1930 07/18/24 2233       REVIEW OF SYSTEMS:  Const: negative fever, negative chills, negative weight loss Eyes: negative diplopia or visual changes, negative eye pain ENT: negative coryza, negative sore throat Resp: negative cough, hemoptysis, dyspnea Cards: negative for chest pain, palpitations, lower extremity edema GU: negative for frequency, dysuria and hematuria GI: Negative for abdominal pain, diarrhea, bleeding, constipation Skin: as above Heme: negative for easy bruising and gum/nose bleeding MS: negative for myalgias, arthralgias, back pain and muscle weakness Neurolo:negative for headaches, dizziness, vertigo, memory problems  Psych: negative for feelings of anxiety, depression  Endocrine:  diabetes- says well controlled Allergy/Immunology- lisinopril Objective:  VITALS:  BP (!) 160/71 (BP Location: Left Arm)   Pulse 70   Temp 98 F (36.7 C) (Oral)   Resp 18   Ht 5' 8 (1.727 m)   Wt 108 kg   LMP 03/26/2012 (Approximate)   SpO2 100%   BMI 36.20 kg/m   PHYSICAL EXAM:  General: Alert, cooperative, no distress, appears stated age.  Head: Normocephalic, without obvious abnormality, atraumatic. Eyes: Conjunctivae clear, anicteric sclerae. Pupils are equal ENT Nares normal. No  drainage or sinus tenderness. Lips, mucosa, and tongue normal. No Thrush Neck: Supple, symmetrical, no adenopathy, thyroid : non tender no carotid bruit and no JVD. Back: thracic spine scar N Rt lumbar area scar has purulent drainage with surrounding erythema     Lungs: Clear to auscultation bilaterally. No Wheezing or Rhonchi. No rales. Heart: Regular rate and rhythm, no murmur,  rub or gallop. Abdomen: Soft, non-tender,not distended. Bowel sounds normal. No masses Extremities: rt foot Lateral amrgin- circluar wound Rt forearm healing scab from dog 's friendly scratch Skin: No rashes or lesions. Or bruising Lymph: Cervical, supraclavicular normal. Neurologic: Grossly non-focal Pertinent Labs Lab Results CBC    Component Value Date/Time   WBC 5.9 07/19/2024 0536   RBC 3.49 (L) 07/19/2024 0536   HGB 10.7 (L) 07/19/2024 0536   HGB 11.4 09/10/2020 1100   HCT 34.0 (L) 07/19/2024 0536   HCT 33.8 (L) 09/10/2020 1100   PLT 227 07/19/2024 0536   PLT 268 09/10/2020 1100   MCV 97.4 07/19/2024 0536   MCV 89 09/10/2020 1100   MCH 30.7 07/19/2024 0536   MCHC 31.5 07/19/2024 0536   RDW 14.7 07/19/2024 0536   RDW 13.2 09/10/2020 1100   LYMPHSABS 2.0 07/18/2024 1418   LYMPHSABS 2.1 09/10/2020 1100   MONOABS 0.7 07/18/2024 1418   EOSABS 0.3 07/18/2024 1418   EOSABS 0.2 09/10/2020 1100   BASOSABS 0.0 07/18/2024 1418   BASOSABS 0.0 09/10/2020 1100       Latest Ref Rng & Units 07/19/2024    5:36 AM 07/18/2024    2:18 PM 06/26/2024    9:43 AM  CMP  Glucose 70 - 99 mg/dL 890  889  854   BUN 8 - 23 mg/dL 28  30  29    Creatinine 0.44 - 1.00 mg/dL 8.26  8.26  8.37   Sodium 135 - 145 mmol/L 140  140  137   Potassium 3.5 - 5.1 mmol/L 3.7  4.5  4.1   Chloride 98 - 111 mmol/L 111  107  105   CO2 22 - 32 mmol/L 21  25  23    Calcium  8.9 - 10.3 mg/dL 8.9  9.4  9.2   Total Protein 6.5 - 8.1 g/dL  6.6  5.8   Total Bilirubin 0.0 - 1.2 mg/dL  0.6  0.8   Alkaline Phos 38 - 126 U/L  67  64   AST  15 - 41 U/L  15  20   ALT 0 - 44 U/L  11  15       Microbiology: Recent Results (from the past 240 hours)  Blood culture (single)     Status: None (Preliminary result)   Collection Time: 07/18/24  2:18 PM   Specimen: BLOOD  Result Value Ref Range Status   Specimen Description BLOOD BLOOD RIGHT ARM  Final   Special Requests   Final    BOTTLES DRAWN AEROBIC AND ANAEROBIC Blood Culture adequate volume   Culture   Final    NO GROWTH < 24 HOURS Performed at [facility], [address], [place], [place] [address]    Report Status PENDING  Incomplete    IMAGING RESULTS: CT lumbar spine Spinal stimulator generator pack has no abscess sin the pocket Superficial skin thickening I have personally reviewed the films ? Impression/Recommendation  Surgical site infection of the lumbar site  Permanent spinal cord stimulator placement on 06/22/2024 the level of T7-T8 disc space with removal of the spinous process at T9.   flank pocket created with the pulse generator on the right side lumbar area  Purulent discharge- culture taken Likely MRSA Pt currently on vanco and ceftriaxone  ( received cefepime  before) Will DC vanco and give linezolid  Very likely the patient will need I/D if MRSA cultured  And further intervention depending on the situation  Discussed the side effcts of antibiotics especially linezolid  Not  on any SSRIs  DM well controlled A1c was 5.9 Peripheral neuropathy Chronic rt lateral wound- looks superficial- doubt underlying bone involved- but may have to start with a xray culture sent  CKD- avoid nephrotoxic meds  Htn on amlodipine  propanolol  HLD on crestor   ? Anemia ? ?This consult involved complex antimicrobial management I have personally spent  -75--minutes involved in face-to-face and non-face-to-face activities for this patient on the day of the visit. Professional time spent includes the following activities: Preparing to see the patient  (review of tests), Obtaining and/or reviewing separately obtained history (admission/discharge record), Performing a medically appropriate examination and/or evaluation , Ordering medications/tests/procedures, referring and communicating with other health care professionals, Documenting clinical information in the EMR, Independently interpreting results (not separately reported), Communicating results to the patient/husband, Counseling and educating the patient/husband and Care coordination with neurosurgeon and hospitalist  ________________________________________________

## 2024-07-19 NOTE — Progress Notes (Signed)
 Progress Note   Patient: Melody Brooks FMW:[id number] DOB: [birth year] DOA: 07/18/2024     1 DOS: the patient was seen and examined on 07/19/2024   Brief hospital course: Per H&P HPI   PATTI SHORB is a 68 y.o. Caucasian female with medical history significant for osteoarthritis, type diabetes mellitus with peripheral neuropathy, chronic kidney disease, essential hypertension, GERD with hiatal hernia, and lumbar spinal stenosis, who recently underwent implant of spinal nerve stimulator in about a couple of days ago, she started noticing redness around her lower lumbar incision with mild bloody drainage.  This has been getting worse and she was concerned about infected wound.  She denied any fever or chills.  No nausea or vomiting or abdominal pain.  No chest pain or palpitations.  No cough or wheezing or dyspnea.  No dysuria, oliguria or hematuria or flank pain.  She was seen in neurosurgical clinic today and then sent to the emergency room to get a CT scan to see extent of infection.   Assessment and Plan: * Spinal cord stimulator surgical site infection Likely superficial. Patient noted to have some purulent drainage that was noted at outpatient NSGY appointment.  CT L spine with no drainable fluid collection.  She is currently receiving vancomycin  and cefepeime.  Neurosurgery consulted and would like to manage conservatively with antibiotics. -Continue vancomycin , narrowed to ceftriaxone  --Follow-up wound cultures -- Infectious disease consulted for further assistance with antibiotics  Type 2 diabetes mellitus with peripheral neuropathy (HCC) Well-controlled A1c 5.7 - Continue patient's home medications  Chronic back pain Continue home pain medications  Anemia of chronic kidney disease Iron deficiency anemia     Latest Ref Rng & Units 07/19/2024    5:36 AM 07/18/2024    2:18 PM 06/26/2024    9:43 AM  CBC  WBC 4.0 - 10.5 K/uL 5.9  7.5  8.4   Hemoglobin 12.0 - 15.0 g/dL 89.2   89.0  89.0   Hematocrit 36.0 - 46.0 % 34.0  34.1  33.7   Platelets 150 - 400 K/uL 227  259  195    Continue home PO iron Follows with nephrology outpatient for ESA  CKD3b    Latest Ref Rng & Units 07/19/2024    5:36 AM 07/18/2024    2:18 PM 06/26/2024    9:43 AM  BMP  Glucose 70 - 99 mg/dL 890  889  854   BUN 8 - 23 mg/dL 28  30  29    Creatinine 0.44 - 1.00 mg/dL 8.26  8.26  8.37   Sodium 135 - 145 mmol/L 140  140  137   Potassium 3.5 - 5.1 mmol/L 3.7  4.5  4.1   Chloride 98 - 111 mmol/L 111  107  105   CO2 22 - 32 mmol/L 21  25  23    Calcium  8.9 - 10.3 mg/dL 8.9  9.4  9.2     Dyslipidemia - Will continue statin therapy.  Essential hypertension - Will continue antihypertensive therapy.  Class 2 obesity  Complicates care. Patient currently on medications for help with this  Continue these.       Subjective: Patient without any pain.  Physical Exam: Vitals:   07/19/24 0815 07/19/24 0900 07/19/24 0942 07/19/24 1229  BP:  (!) 163/75 (!) 163/75 (!) 160/71  Pulse:  73  70  Resp:  19  18  Temp: 97.9 F (36.6 C)   98 F (36.7 C)  TempSrc: Oral   Oral  SpO2:  100%  100%  Weight:      Height:       Physical Exam  Constitutional: In no distress.  Cardiovascular: Normal rate, regular rhythm. No lower extremity edema  Pulmonary: Non labored breathing on room air, no wheezing or rales.   Abdominal: Soft. Non distended and non tender Musculoskeletal: Normal range of motion.     Neurological: Alert and oriented to person, place, and time. Non focal  Skin: Skin is warm and dry. Bandage on wound on lower back. Some surrounding erythema, minimal drainage. Mildly wTT, see media tab for images.   Data Reviewed:     Latest Ref Rng & Units 07/19/2024    5:36 AM 07/18/2024    2:18 PM 06/26/2024    9:43 AM  CBC  WBC 4.0 - 10.5 K/uL 5.9  7.5  8.4   Hemoglobin 12.0 - 15.0 g/dL 89.2  89.0  89.0   Hematocrit 36.0 - 46.0 % 34.0  34.1  33.7   Platelets 150 - 400 K/uL 227  259  195        Latest Ref Rng & Units 07/19/2024    5:36 AM 07/18/2024    2:18 PM 06/26/2024    9:43 AM  BMP  Glucose 70 - 99 mg/dL 890  889  854   BUN 8 - 23 mg/dL 28  30  29    Creatinine 0.44 - 1.00 mg/dL 8.26  8.26  8.37   Sodium 135 - 145 mmol/L 140  140  137   Potassium 3.5 - 5.1 mmol/L 3.7  4.5  4.1   Chloride 98 - 111 mmol/L 111  107  105   CO2 22 - 32 mmol/L 21  25  23    Calcium  8.9 - 10.3 mg/dL 8.9  9.4  9.2      Family Communication: Spouse at bedside   Disposition: Status is: Inpatient Remains inpatient appropriate because: IV antibiotics   Planned Discharge Destination: Home    Time spent: 35 minutes  Author: Alban Pepper, MD 07/19/2024 4:30 PM  For on call review [email or web address].

## 2024-07-20 ENCOUNTER — Other Ambulatory Visit (HOSPITAL_COMMUNITY): Payer: Self-pay

## 2024-07-20 DIAGNOSIS — L0291 Cutaneous abscess, unspecified: Secondary | ICD-10-CM

## 2024-07-20 DIAGNOSIS — L02212 Cutaneous abscess of back [any part, except buttock]: Secondary | ICD-10-CM

## 2024-07-20 DIAGNOSIS — T8149XA Infection following a procedure, other surgical site, initial encounter: Secondary | ICD-10-CM | POA: Diagnosis not present

## 2024-07-20 DIAGNOSIS — L03312 Cellulitis of back [any part except buttock]: Secondary | ICD-10-CM | POA: Diagnosis not present

## 2024-07-20 LAB — CBC
HCT: 31.5 % — ABNORMAL LOW (ref 36.0–46.0)
Hemoglobin: 10.2 g/dL — ABNORMAL LOW (ref 12.0–15.0)
MCH: 30.9 pg (ref 26.0–34.0)
MCHC: 32.4 g/dL (ref 30.0–36.0)
MCV: 95.5 fL (ref 80.0–100.0)
Platelets: 200 K/uL (ref 150–400)
RBC: 3.3 MIL/uL — ABNORMAL LOW (ref 3.87–5.11)
RDW: 14.6 % (ref 11.5–15.5)
WBC: 5.3 K/uL (ref 4.0–10.5)
nRBC: 0 % (ref 0.0–0.2)

## 2024-07-20 LAB — BASIC METABOLIC PANEL WITH GFR
Anion gap: 8 (ref 5–15)
BUN: 28 mg/dL — ABNORMAL HIGH (ref 8–23)
CO2: 21 mmol/L — ABNORMAL LOW (ref 22–32)
Calcium: 9 mg/dL (ref 8.9–10.3)
Chloride: 111 mmol/L (ref 98–111)
Creatinine, Ser: 1.34 mg/dL — ABNORMAL HIGH (ref 0.44–1.00)
GFR, Estimated: 43 mL/min — ABNORMAL LOW (ref >60)
Glucose, Bld: 119 mg/dL — ABNORMAL HIGH (ref 70–99)
Potassium: 3.5 mmol/L (ref 3.5–5.1)
Sodium: 140 mmol/L (ref 135–145)

## 2024-07-20 LAB — GLUCOSE, CAPILLARY
Glucose-Capillary: 142 mg/dL — ABNORMAL HIGH (ref 70–99)
Glucose-Capillary: 153 mg/dL — ABNORMAL HIGH (ref 70–99)
Glucose-Capillary: 99 mg/dL (ref 70–99)
Glucose-Capillary: 131 mg/dL — ABNORMAL HIGH (ref 70–99)

## 2024-07-20 MED ORDER — INSULIN ASPART 100 UNIT/ML IJ SOLN
0.0000 [IU] | Freq: Three times a day (TID) | INTRAMUSCULAR | Status: DC
  Administered 2024-07-20: 3 [IU] via SUBCUTANEOUS
  Administered 2024-07-21: 2 [IU] via SUBCUTANEOUS
  Administered 2024-07-22: 3 [IU] via SUBCUTANEOUS
  Administered 2024-07-23: 5 [IU] via SUBCUTANEOUS
  Filled 2024-07-20 (×3): qty 1

## 2024-07-20 MED ORDER — DIPHENHYDRAMINE HCL 25 MG PO CAPS
25.0000 mg | ORAL_CAPSULE | Freq: Every day | ORAL | Status: DC
  Administered 2024-07-20 – 2024-07-22 (×3): 25 mg via ORAL
  Filled 2024-07-20 (×3): qty 1

## 2024-07-20 NOTE — Progress Notes (Addendum)
 Pharmacy - Linezolid  benefit check  Performed benefit check for linezolid  in case discharges on the medication, insurance requires prior authorization.    Prior Auth completed - awaiting response  Update: 07/20/2024 at 10:30am: Prior auth was approved    Tiea Manninen, PharmD, BCPS, BCIDP Work Cell: [phone] 07/20/2024 9:46 AM

## 2024-07-20 NOTE — Progress Notes (Signed)
 Progress Note   Patient: Melody Brooks FMW:[id number] DOB: [birth year] DOA: 07/18/2024     2 DOS: the patient was seen and examined on 07/20/2024   Brief hospital course: Per H&P HPI   Melody Brooks is a 68 y.o. Caucasian female with medical history significant for osteoarthritis, type diabetes mellitus with peripheral neuropathy, chronic kidney disease, essential hypertension, GERD with hiatal hernia, and lumbar spinal stenosis, who recently underwent implant of spinal nerve stimulator in about a couple of days ago, she started noticing redness around her lower lumbar incision with mild bloody drainage.  This has been getting worse and she was concerned about infected wound.  She denied any fever or chills.  No nausea or vomiting or abdominal pain.  No chest pain or palpitations.  No cough or wheezing or dyspnea.  No dysuria, oliguria or hematuria or flank pain.  She was seen in neurosurgical clinic today and then sent to the emergency room to get a CT scan to see extent of infection.   Assessment and Plan: * Spinal cord stimulator surgical site infection Likely superficial. Patient noted to have some purulent drainage that was noted at outpatient NSGY appointment.  Cultures were obtained then. CT L spine with no drainable fluid collection.  She is currently receiving vancomycin  and cefepeime.  Neurosurgery consulted and would like to manage conservatively with antibiotics. Infectious disease also consulted for assistance with antibiotics --Patient currently receiving ceftriaxone  and linezolid  --Follow-up wound cultures    Type 2 diabetes mellitus with peripheral neuropathy (HCC) Well-controlled A1c 5.7 - Sliding scale insulin  while inpatient  CBG (last 3)  Recent Labs    07/20/24 0909 07/20/24 1200 07/20/24 1613  GLUCAP 142* 153* 99    Chronic back pain Continue home pain medications  Anemia of chronic kidney disease Iron deficiency anemia     Latest Ref Rng & Units  07/20/2024    4:49 AM 07/19/2024    5:36 AM 07/18/2024    2:18 PM  CBC  WBC 4.0 - 10.5 K/uL 5.3  5.9  7.5   Hemoglobin 12.0 - 15.0 g/dL 89.7  89.2  89.0   Hematocrit 36.0 - 46.0 % 31.5  34.0  34.1   Platelets 150 - 400 K/uL 200  227  259    Continue home PO iron Follows with nephrology outpatient for ESA  CKD3b    Latest Ref Rng & Units 07/20/2024    4:49 AM 07/19/2024    5:36 AM 07/18/2024    2:18 PM  BMP  Glucose 70 - 99 mg/dL 880  890  889   BUN 8 - 23 mg/dL 28  28  30    Creatinine 0.44 - 1.00 mg/dL 8.65  8.26  8.26   Sodium 135 - 145 mmol/L 140  140  140   Potassium 3.5 - 5.1 mmol/L 3.5  3.7  4.5   Chloride 98 - 111 mmol/L 111  111  107   CO2 22 - 32 mmol/L 21  21  25    Calcium  8.9 - 10.3 mg/dL 9.0  8.9  9.4   Serum creatinine improved this morning Continue to monitor while on antibiotic therapy  Dyslipidemia - Will continue statin therapy.  Essential hypertension - Will continue antihypertensive therapy.  Class 2 obesity  Complicates care. Patient currently on medications for help with this  Continue these.       Subjective: No issues overnight.  Physical Exam: Vitals:   07/19/24 0900 07/19/24 0942 07/19/24 1229 07/19/24 2201  BP: ROLLEN)  163/75 (!) 163/75 (!) 160/71 (!) 138/58  Pulse: 73  70 73  Resp: 19  18 16   Temp:   98 F (36.7 C) (!) 97.4 F (36.3 C)  TempSrc:   Oral   SpO2: 100%  100% 99%  Weight:    108.9 kg  Height:    5' 8 (1.727 m)   Physical Exam  Constitutional: In no distress.  Cardiovascular: Normal rate, regular rhythm. No lower extremity edema  Pulmonary: Non labored breathing on room air, no wheezing or rales.   Abdominal: Soft. Non distended and non tender Musculoskeletal: Normal range of motion.     Neurological: Alert and oriented to person, place, and time. Non focal  Skin: R back wound R of midline small amount purulent materiial expressed. Surruonding erythema and blanching.    Data Reviewed:     Latest Ref Rng & Units  07/20/2024    4:49 AM 07/19/2024    5:36 AM 07/18/2024    2:18 PM  CBC  WBC 4.0 - 10.5 K/uL 5.3  5.9  7.5   Hemoglobin 12.0 - 15.0 g/dL 89.7  89.2  89.0   Hematocrit 36.0 - 46.0 % 31.5  34.0  34.1   Platelets 150 - 400 K/uL 200  227  259       Latest Ref Rng & Units 07/20/2024    4:49 AM 07/19/2024    5:36 AM 07/18/2024    2:18 PM  BMP  Glucose 70 - 99 mg/dL 880  890  889   BUN 8 - 23 mg/dL 28  28  30    Creatinine 0.44 - 1.00 mg/dL 8.65  8.26  8.26   Sodium 135 - 145 mmol/L 140  140  140   Potassium 3.5 - 5.1 mmol/L 3.5  3.7  4.5   Chloride 98 - 111 mmol/L 111  111  107   CO2 22 - 32 mmol/L 21  21  25    Calcium  8.9 - 10.3 mg/dL 9.0  8.9  9.4      Family Communication: None at bedside  Disposition: Status is: Inpatient Remains inpatient appropriate because: IV antibiotics.  Will possibly need surgery  Planned Discharge Destination: Home    Time spent: 35 minutes  Author: Alban Pepper, MD 07/20/2024 7:22 AM  For on call review [email or web address].

## 2024-07-20 NOTE — TOC Benefit Eligibility Note (Signed)
 Pharmacy Patient Advocate Encounter  Insurance verification completed.    The patient is insured through [facility]. Patient has Medicare and is not eligible for a copay card, but may be able to apply for patient assistance or Medicare RX Payment Plan (Patient Must reach out to their plan, if eligible for payment plan), if available.    Ran test claim for Linzeolid 600mg  tabs and the current 14 day co-pay is $0.   This test claim was processed through [employer]- copay amounts may vary at other pharmacies due to [employer], or as the patient moves through the different stages of their insurance plan.

## 2024-07-20 NOTE — TOC Initial Note (Signed)
 Transition of Care [facility]) - Initial/Assessment Note    Patient Details  Name: Melody Brooks MRN: [id number] Date of Birth: [birth year]  [facility]) CM/SW Contact:    Dalia GORMAN Fuse, RN Phone Number: 07/20/2024, 1:02 PM  Clinical Narrative:                 Patient is from home with her husband. She is independent of her ADLs and is able to drive. She has a PCP, Dr. Auston, and doesn't have any difficulty obtaining medications. She doesn't use any DME.  No TOC needs, please outreach if needs are identified.  Expected Discharge Plan: Home/Self Care Barriers to Discharge: Continued Medical Work up   Patient Goals and CMS Choice            Expected Discharge Plan and Services                                              Prior Living Arrangements/Services                       Activities of Daily Living   ADL Screening (condition at time of admission) Independently performs ADLs?: Yes (appropriate for developmental age) Is the patient deaf or have difficulty hearing?: No Does the patient have difficulty seeing, even when wearing glasses/contacts?: No Does the patient have difficulty concentrating, remembering, or making decisions?: No  Permission Sought/Granted                  Emotional Assessment              Admission diagnosis:  Cellulitis of back [L03.312] Surgical site infection [T81.49XA] Patient Active Problem List   Diagnosis Date Noted   Surgical site infection 07/19/2024   Cellulitis of back 07/18/2024   Dyslipidemia 07/18/2024   Type 2 diabetes mellitus with peripheral neuropathy (HCC) 07/18/2024   Ulcerated, foot, right, limited to breakdown of skin (HCC) 07/06/2024   Acute encephalopathy 06/26/2024   Stage 3a chronic kidney disease (CKD) (HCC) 06/26/2024   AKI (acute kidney injury) (HCC) 06/26/2024   Visual hallucination 06/26/2024   Pain 03/20/2024   Chronic painful diabetic neuropathy (HCC) 03/20/2024    Spinal stenosis 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 03/20/2024   Lumbar facet arthropathy 03/20/2024   Chronic pain syndrome 03/20/2024   Basal cell carcinoma (BCC) 11/06/2019   Chronic urticaria 09/10/2019   Type 2 diabetes mellitus (HCC) 09/10/2019   Dermatochalasis of eyelid 08/14/2018   Ptosis, both eyelids 08/14/2018   Visual field defect 08/14/2018   Morbid obesity with BMI of 40.0-44.9, adult (HCC) 03/10/2018   Carotid stenosis 12/16/2017   Accumulation of fluid in tissues 07/08/2015   Arterial fibromuscular dysplasia (HCC) 07/08/2015   Acid reflux 07/08/2015   Left arm pain 03/12/2015   Obesity 03/12/2015   Essential hypertension 03/12/2015   Bursitis, trochanteric 12/12/2014   Lumbosacral radiculitis 12/11/2014   Angio-edema 05/24/2013   Internal hemorrhoids with complication 04/07/2012   Hyperlipemia 12/04/2010   PALPITATIONS 12/04/2010   DYSPNEA 12/04/2010   ABNORMAL EKG 12/04/2010   Inflammation with thrombosis 04/23/2010   Anemia, iron deficiency 03/06/2008   B12 deficiency 03/06/2008   Avitaminosis D 03/06/2008   Insomnia 03/01/2008   Idiopathic peripheral neuropathy 12/23/2003   Diabetes mellitus with polyneuropathy (HCC) 07/17/2003   Idiopathic scoliosis 07/30/2002   PCP:  Auston,  Reyes BIRCH, MD Pharmacy:   [facility] [address] [place] - [address] [place] [address] Phone: [phone] Fax: [phone]  [facility] [address] [place] [address] Phone: [phone] Fax: [phone]     Social Drivers of Health (SDOH) Social History: SDOH Screenings   Food Insecurity: No Food Insecurity (07/19/2024)  Housing: Low Risk  (07/19/2024)  Transportation Needs: No Transportation Needs (07/19/2024)  Utilities: Not At Risk (07/19/2024)  Alcohol  Screen: Low Risk  (07/28/2020)  Depression (PHQ2-9): Low Risk  (05/07/2024)  Financial Resource Strain: Low Risk   (04/17/2024)   Received from [facility]  Social Connections: Unknown (07/19/2024)  Tobacco Use: Low Risk  (07/19/2024)   SDOH Interventions:     Readmission Risk Interventions     No data to display

## 2024-07-20 NOTE — Plan of Care (Signed)

## 2024-07-20 NOTE — Progress Notes (Signed)
 Date of Admission:  07/18/2024      ID: Melody Brooks is a 68 y.o. female  Principal Problem:   Cellulitis of back Active Problems:   Essential hypertension   Dyslipidemia   Type 2 diabetes mellitus with peripheral neuropathy (HCC)   Surgical site infection    Subjective: Pt is feeling better   Medications:   amLODipine   5 mg Oral Daily   aspirin  EC  81 mg Oral Daily   cholecalciferol   5,000 Units Oral Daily   cyclobenzaprine   10 mg Oral QHS   diphenhydrAMINE   25 mg Oral QHS   estrogen (conjugated)-medroxyprogesterone  1 tablet Oral Daily   famotidine   20 mg Oral QHS   ferrous sulfate   325 mg Oral QHS   furosemide   20 mg Oral Daily   insulin  aspart  0-15 Units Subcutaneous TID WC   leptospermum manuka honey  1 Application Topical Daily   magnesium  oxide  400 mg Oral Daily   pantoprazole   40 mg Oral Daily   polyethylene glycol  17 g Oral Daily   pregabalin   100 mg Oral BID   propranolol  ER  60 mg Oral Daily   rosuvastatin   10 mg Oral Daily   spironolactone   25 mg Oral Daily   topiramate   50 mg Oral BID   zinc  sulfate (50mg  elemental zinc )  220 mg Oral Daily   zolpidem   5 mg Oral QHS    Objective: Vital signs in last 24 hours: Patient Vitals for the past 24 hrs:  BP Temp Temp src Pulse Resp SpO2 Height Weight  07/20/24 0911 (!) 113/55 97.6 F (36.4 C) Oral 65 19 100 % -- --  07/19/24 2201 (!) 138/58 (!) 97.4 F (36.3 C) -- 73 16 99 % 5' 8 (1.727 m) 108.9 kg     PHYSICAL EXAM:  General: Alert, cooperative, no distress, appears stated age.  Lungs: Clear to auscultation bilaterally. No Wheezing or Rhonchi. No rales. Heart: Regular rate and rhythm, no murmur, rub or gallop. Abdomen: Soft, non-tender,not distended. Bowel sounds normal. No masses Extremities: rt foot lateral margin ulcer Back- rt lumbar area- the redness around surgical site much better- has purulent drainage Skin: No rashes or lesions. Or bruising Lymph: Cervical, supraclavicular  normal. Neurologic: Grossly non-focal  Lab Results    Latest Ref Rng & Units 07/20/2024    4:49 AM 07/19/2024    5:36 AM 07/18/2024    2:18 PM  CBC  WBC 4.0 - 10.5 K/uL 5.3  5.9  7.5   Hemoglobin 12.0 - 15.0 g/dL 89.7  89.2  89.0   Hematocrit 36.0 - 46.0 % 31.5  34.0  34.1   Platelets 150 - 400 K/uL 200  227  259        Latest Ref Rng & Units 07/20/2024    4:49 AM 07/19/2024    5:36 AM 07/18/2024    2:18 PM  CMP  Glucose 70 - 99 mg/dL 880  890  889   BUN 8 - 23 mg/dL 28  28  30    Creatinine 0.44 - 1.00 mg/dL 8.65  8.26  8.26   Sodium 135 - 145 mmol/L 140  140  140   Potassium 3.5 - 5.1 mmol/L 3.5  3.7  4.5   Chloride 98 - 111 mmol/L 111  111  107   CO2 22 - 32 mmol/L 21  21  25    Calcium  8.9 - 10.3 mg/dL 9.0  8.9  9.4   Total Protein 6.5 -  8.1 g/dL   6.6   Total Bilirubin 0.0 - 1.2 mg/dL   0.6   Alkaline Phos 38 - 126 U/L   67   AST 15 - 41 U/L   15   ALT 0 - 44 U/L   11       Microbiology: Wound culture pending Studies/Results: CT Lumbar Spine Wo Contrast Result Date: 07/18/2024 EXAM: CT OF THE LUMBAR SPINE WITHOUT CONTRAST 07/18/2024 05:05:25 PM TECHNIQUE: CT of the lumbar spine was performed without the administration of intravenous contrast. Multiplanar reformatted images are provided for review. Automated exposure control, iterative reconstruction, and/or weight based adjustment of the mA/kV was utilized to reduce the radiation dose to as low as reasonably achievable. COMPARISON: MRI lumbar spine 09/06/2023 CLINICAL HISTORY: Low back pain, infection or inflammation suspected (Ped 0-17y); Concern for potential infection at surgical site. PT had spinal stimulator placed aug 22n. Last night noticed bleeding from site. Went to [employer] today and was advised to come to ED for possible infection. Pt denies fever and denies pain. FINDINGS: BONES AND ALIGNMENT: Moderate dextrocurvature of the lumbar spine centered at L2-L3 with slight component of rotary subluxation. No acute  fracture in the lumbar spine. No destructive osseous lesion. No focal osteopenia or erosive change appreciated. Schmorl's nodes in the lower thoracic spine, particularly at T11. At T12-L1, there is no significant spinal canal or foraminal stenosis. DEGENERATIVE CHANGES: At L1-2, there is mild disc space narrowing and vacuum disc phenomenon, a small disc bulge, and mild facet arthrosis. No significant spinal canal or foraminal stenosis. At L2-L3, there is mild disc space narrowing and vacuum disc phenomenon. There is a small disc bulge, mild-to-moderate facet arthrosis, and thickening of the ligamentum flavum. There is no significant spinal canal stenosis or foraminal stenosis. At L3-L4, there is moderate disc space narrowing and vacuum disc phenomenon. There is a diffuse disc bulge resulting in lateral recess narrowing, moderate facet arthrosis, and thickening of the ligamentum flavum. There is moderate spinal canal stenosis and moderate foraminal stenosis on the right. At L4-5, there is a diffuse disc bulge and moderate facet arthrosis. Thickening of the ligamentum flavum is present. There is lateral recess narrowing and mild spinal canal stenosis. There is mild foraminal stenosis on the left. At L5-S1, there is mild disc space narrowing and vacuum disc phenomenon, small disc bulge, and moderate facet arthrosis. No significant spinal canal stenosis. There is moderate foraminal stenosis on the left. SOFT TISSUES: Spinal stimulator pack noted within the subcutaneous tissues right of midline over the lumbar spine at the level of L2. There is mild thickening of the skin overlying the stimulator site which may reflect postsurgical changes versus cellulitis. Within the limitations of streak artifact, there is no evidence of surrounding fluid collection. No locules of gas appreciated. Spinal stimulator lead courses superiorly within the paraspinal soft tissues to the lower thoracic spine. There is no significant  abnormality of the paraspinal musculature. VASCULATURE: Mild atherosclerosis of the abdominal aorta and branch vessels. KIDNEYS, URETERS, AND BLADDER: There is an exophytic cyst along the lateral aspect of the right kidney, which measures at least 1.9 cm in diameter. GALLBLADDER AND BILE DUCTS: Cholelithiasis. IMPRESSION: 1. Mild thickening of the skin overlying the stimulator site, possibly postsurgical changes versus cellulitis. No surrounding fluid collection or gas appreciated, limited by streak artifact. 2. Degenerative changes as detailed above, most pronounced at L3-L4 with moderate spinal canal stenosis and moderate right foraminal stenosis. 3. Moderate dextrocurvature of the lumbar spine centered at L2-L3. Electronically  signed by: Donnice Mania MD 07/18/2024 06:37 PM EDT RP Workstation: [id number]     Assessment/Plan: Permanent spinal cord stimulator placement on 06/22/2024 at  the level of T7-T8 disc space with removal of the spinous process at T9.   flank pocket created with the pulse generator on the right side lumbar area Surgical site infection Abscess with surrounding cellulitis    Pt currently on IV linezolid  and IV ceftriaxone  culture pending. Depending on the result will tailor antibiotics if MRSA she will need I/D And further intervention like removal of hardware depending on the above situation Will need 2 weeks of antibiotics   Discussed the side effcts of antibiotics especially linezolid  Not on any SSRIs Hold phenteramine while on linezolid    DM well controlled A1c was 5.9 Peripheral neuropathy Chronic rt lateral wound- looks superficial- doubt underlying bone involved- but may have to start with a xray culture sent   CKD- avoid nephrotoxic meds   Htn on amlodipine  propanolol   HLD on crestor    ? Anemia Discussed the management with patient , her husband, Dr.Smith and Dr.Carter Pt will not be seen routinely this weekend  ? On call ID physician available by  phone for urgent issues. Call if needed

## 2024-07-20 NOTE — Care Management Important Message (Signed)
 Important Message  Patient Details  Name: Melody Brooks MRN: [id number] Date of Birth: 01/21/1956   Important Message Given:  Yes - Medicare IM     [name] 07/20/2024, 2:28 PM

## 2024-07-21 ENCOUNTER — Inpatient Hospital Stay

## 2024-07-21 DIAGNOSIS — L0291 Cutaneous abscess, unspecified: Secondary | ICD-10-CM | POA: Diagnosis not present

## 2024-07-21 DIAGNOSIS — L03312 Cellulitis of back [any part except buttock]: Secondary | ICD-10-CM | POA: Diagnosis not present

## 2024-07-21 DIAGNOSIS — T8149XA Infection following a procedure, other surgical site, initial encounter: Secondary | ICD-10-CM | POA: Diagnosis not present

## 2024-07-21 LAB — CBC
HCT: 31.4 % — ABNORMAL LOW (ref 36.0–46.0)
Hemoglobin: 10.1 g/dL — ABNORMAL LOW (ref 12.0–15.0)
MCH: 30.5 pg (ref 26.0–34.0)
MCHC: 32.2 g/dL (ref 30.0–36.0)
MCV: 94.9 fL (ref 80.0–100.0)
Platelets: 214 K/uL (ref 150–400)
RBC: 3.31 MIL/uL — ABNORMAL LOW (ref 3.87–5.11)
RDW: 14.6 % (ref 11.5–15.5)
WBC: 5.8 K/uL (ref 4.0–10.5)
nRBC: 0 % (ref 0.0–0.2)

## 2024-07-21 LAB — IRON AND TIBC
Iron: 66 ug/dL (ref 28–170)
Saturation Ratios: 19 % (ref 10.4–31.8)
TIBC: 356 ug/dL (ref 250–450)
UIBC: 290 ug/dL

## 2024-07-21 LAB — BASIC METABOLIC PANEL WITH GFR
Anion gap: 10 (ref 5–15)
BUN: 24 mg/dL — ABNORMAL HIGH (ref 8–23)
CO2: 23 mmol/L (ref 22–32)
Calcium: 9.1 mg/dL (ref 8.9–10.3)
Chloride: 109 mmol/L (ref 98–111)
Creatinine, Ser: 1.39 mg/dL — ABNORMAL HIGH (ref 0.44–1.00)
GFR, Estimated: 41 mL/min — ABNORMAL LOW (ref >60)
Glucose, Bld: 126 mg/dL — ABNORMAL HIGH (ref 70–99)
Potassium: 3.6 mmol/L (ref 3.5–5.1)
Sodium: 142 mmol/L (ref 135–145)

## 2024-07-21 LAB — GLUCOSE, CAPILLARY
Glucose-Capillary: 92 mg/dL (ref 70–99)
Glucose-Capillary: 140 mg/dL — ABNORMAL HIGH (ref 70–99)
Glucose-Capillary: 105 mg/dL — ABNORMAL HIGH (ref 70–99)
Glucose-Capillary: 134 mg/dL — ABNORMAL HIGH (ref 70–99)

## 2024-07-21 LAB — FERRITIN: Ferritin: 37 ng/mL (ref 11–307)

## 2024-07-21 MED ORDER — SODIUM CHLORIDE 0.9 % IV SOLN
2.0000 g | Freq: Two times a day (BID) | INTRAVENOUS | Status: DC
  Administered 2024-07-21 – 2024-07-23 (×5): 2 g via INTRAVENOUS
  Filled 2024-07-21 (×7): qty 12.5

## 2024-07-21 NOTE — Progress Notes (Signed)
 Progress Note   Patient: Melody Brooks FMW:[id number] DOB: Feb 24, 1956 DOA: 07/18/2024     3 DOS: the patient was seen and examined on 07/21/2024   Brief hospital course: Per H&P HPI   Melody Brooks is a 68 y.o. Caucasian female with medical history significant for osteoarthritis, type diabetes mellitus with peripheral neuropathy, chronic kidney disease, essential hypertension, GERD with hiatal hernia, and lumbar spinal stenosis, who recently underwent implant of spinal nerve stimulator in about a couple of days ago, she started noticing redness around her lower lumbar incision with mild bloody drainage.  This has been getting worse and she was concerned about infected wound.  She denied any fever or chills.  No nausea or vomiting or abdominal pain.  No chest pain or palpitations.  No cough or wheezing or dyspnea.  No dysuria, oliguria or hematuria or flank pain.  She was seen in neurosurgical clinic today and then sent to the emergency room to get a CT scan to see extent of infection.   Assessment and Plan: * Spinal cord stimulator surgical site infection Likely superficial. Patient noted to have some purulent drainage that was noted at outpatient NSGY appointment.  Cultures were obtained then. CT L spine with no drainable fluid collection.  She is currently receiving vancomycin  and cefepeime.  Neurosurgery consulted and would like to manage conservatively with antibiotics. Infectious disease also consulted for assistance with antibiotics -- Ceftriaxone  transition to cefepime  due to right foot infection, continuing linezolid  --Follow-up wound cultures, growing Staph aureus but awaiting sensitivities -- Neurosurgery following for possible removal of her device   Type 2 diabetes mellitus with peripheral neuropathy (HCC) Well-controlled A1c 5.7 - Sliding scale insulin  while inpatient  CBG (last 3)  Recent Labs    07/21/24 0745 07/21/24 1138 07/21/24 1622  GLUCAP 92 140* 105*     Chronic back pain Continue home pain medications  Anemia of chronic kidney disease Iron deficiency anemia     Latest Ref Rng & Units 07/21/2024    3:11 AM 07/20/2024    4:49 AM 07/19/2024    5:36 AM  CBC  WBC 4.0 - 10.5 K/uL 5.8  5.3  5.9   Hemoglobin 12.0 - 15.0 g/dL 89.8  89.7  89.2   Hematocrit 36.0 - 46.0 % 31.4  31.5  34.0   Platelets 150 - 400 K/uL 214  200  227    Continue home PO iron Follows with nephrology outpatient for ESA  CKD3b    Latest Ref Rng & Units 07/21/2024    3:11 AM 07/20/2024    4:49 AM 07/19/2024    5:36 AM  BMP  Glucose 70 - 99 mg/dL 873  880  890   BUN 8 - 23 mg/dL 24  28  28    Creatinine 0.44 - 1.00 mg/dL 8.60  8.65  8.26   Sodium 135 - 145 mmol/L 142  140  140   Potassium 3.5 - 5.1 mmol/L 3.6  3.5  3.7   Chloride 98 - 111 mmol/L 109  111  111   CO2 22 - 32 mmol/L 23  21  21    Calcium  8.9 - 10.3 mg/dL 9.1  9.0  8.9   Serum creatinine i stable Continue to monitor while on antibiotic therapy  Dyslipidemia - Will continue statin therapy.  Essential hypertension - Will continue antihypertensive therapy.  Class 2 obesity  BMI is 36.49.  Complicates care. Patient currently on medications for help with this.  Phentermine  was held given interaction with  linezolid .      Subjective: No issues overnight.  Physical Exam: Vitals:   07/20/24 2030 07/21/24 0439 07/21/24 0742 07/21/24 1622  BP: 105/60 (!) 115/47 115/69 (!) 119/48  Pulse: 71 66 65 65  Resp:  16 18 18   Temp: (!) 97.5 F (36.4 C) 97.7 F (36.5 C) 97.9 F (36.6 C) 98.2 F (36.8 C)  TempSrc:      SpO2: 100% 94% 100% 100%  Weight:      Height:       Constitutional: In no distress.  Cardiovascular: Normal rate, regular rhythm. No lower extremity edema  Pulmonary: Non labored breathing on room air, no wheezing or rales.   Abdominal: Soft. Non distended and non tender Musculoskeletal: Normal range of motion.     Neurological: Alert and oriented to person, place, and time. Non  focal  Skin: Right back wound right of midline, small amount of purulent material.  Surrounding erythema improved  Data Reviewed:     Latest Ref Rng & Units 07/21/2024    3:11 AM 07/20/2024    4:49 AM 07/19/2024    5:36 AM  CBC  WBC 4.0 - 10.5 K/uL 5.8  5.3  5.9   Hemoglobin 12.0 - 15.0 g/dL 89.8  89.7  89.2   Hematocrit 36.0 - 46.0 % 31.4  31.5  34.0   Platelets 150 - 400 K/uL 214  200  227       Latest Ref Rng & Units 07/21/2024    3:11 AM 07/20/2024    4:49 AM 07/19/2024    5:36 AM  BMP  Glucose 70 - 99 mg/dL 873  880  890   BUN 8 - 23 mg/dL 24  28  28    Creatinine 0.44 - 1.00 mg/dL 8.60  8.65  8.26   Sodium 135 - 145 mmol/L 142  140  140   Potassium 3.5 - 5.1 mmol/L 3.6  3.5  3.7   Chloride 98 - 111 mmol/L 109  111  111   CO2 22 - 32 mmol/L 23  21  21    Calcium  8.9 - 10.3 mg/dL 9.1  9.0  8.9      Family Communication: None at bedside  Disposition: Status is: Inpatient Remains inpatient appropriate because: IV antibiotics.  Will possibly need surgery  Planned Discharge Destination: Home    Time spent: 35 minutes  Author: Alban Pepper, MD 07/21/2024 5:29 PM  For on call review [email or web address].

## 2024-07-21 NOTE — Progress Notes (Signed)
 Attending Progress Note  History: Melody Brooks is here for wound infection after a surgical placement of a spinal cord stimulator.  She has been admitted for IV antibiotics.  Hospital Day: 4  Physical Exam: Vitals:   07/21/24 0439 07/21/24 0742  BP: (!) 115/47 115/69  Pulse: 66 65  Resp: 16 18  Temp: 97.7 F (36.5 C) 97.9 F (36.6 C)  SpO2: 94% 100%    AA Ox3 CNI  Incision: lessened drainage, decreasing erythema.   Data:  Recent Labs  Lab 07/19/24 0536 07/20/24 0449 07/21/24 0311  NA 140 140 142  K 3.7 3.5 3.6  CL 111 111 109  CO2 21* 21* 23  BUN 28* 28* 24*  CREATININE 1.73* 1.34* 1.39*  GLUCOSE 109* 119* 126*  CALCIUM  8.9 9.0 9.1   Recent Labs  Lab 07/18/24 1418  AST 15  ALT 11  ALKPHOS 67     Recent Labs  Lab 07/19/24 0536 07/20/24 0449 07/21/24 0311  WBC 5.9 5.3 5.8  HGB 10.7* 10.2* 10.1*  HCT 34.0* 31.5* 31.4*  PLT 227 200 214   No results for input(s): APTT, INR in the last 168 hours.      No results found.  Other tests/results:  Recent Results (from the past 240 hours)  Blood culture (single)     Status: None (Preliminary result)   Collection Time: 07/18/24  2:18 PM   Specimen: BLOOD  Result Value Ref Range Status   Specimen Description BLOOD BLOOD RIGHT ARM  Final   Special Requests   Final    BOTTLES DRAWN AEROBIC AND ANAEROBIC Blood Culture adequate volume   Culture   Final    NO GROWTH 3 DAYS Performed at [facility], [address], [place], [place] [address]    Report Status PENDING  Incomplete  Aerobic Culture w Gram Stain (superficial specimen)     Status: None (Preliminary result)   Collection Time: 07/19/24  6:06 PM   Specimen: Back; Wound  Result Value Ref Range Status   Specimen Description   Final    BACK Performed at [facility], [address], [place], [place] [address]    Special Requests   Final    NONE Performed at [facility], [address] [place], [place]  [address]    Gram Stain   Final    ABUNDANT WBC PRESENT, PREDOMINANTLY PMN NO ORGANISMS SEEN    Culture   Final    FEW STAPHYLOCOCCUS AUREUS CULTURE REINCUBATED FOR BETTER GROWTH Performed at [facility], [address] [place], [place] [address]    Report Status PENDING  Incomplete  Aerobic Culture w Gram Stain (superficial specimen)     Status: None (Preliminary result)   Collection Time: 07/19/24  6:07 PM   Specimen: Wound  Result Value Ref Range Status   Specimen Description   Final    WOUND Performed at [facility], [address], [place], [place] [address]    Special Requests   Final    RIGHT FOOT Performed at [facility], [address] [place], [place] [address]    Gram Stain   Final    RARE WBC PRESENT, PREDOMINANTLY PMN FEW GRAM POSITIVE COCCI FEW GRAM NEGATIVE RODS    Culture   Final    ABUNDANT PSEUDOMONAS AERUGINOSA CULTURE REINCUBATED FOR BETTER GROWTH SUSCEPTIBILITIES TO FOLLOW Performed at [facility], [address], [place], [place] [address]    Report Status PENDING  Incomplete     Assessment/Plan:  Melody Brooks 68 year old woman with a history of spinal cord stimulator.  She has risk factors including diabetes, wound healing issues.  She presented with drainage at her battery site.    - Micro: Appreciate ID recs.  Wound culture with PMNs and staphylococci.  Susceptibilities pending. -Will continue to follow, please make n.p.o. at midnight in case patient worsens and needs removal tomorrow.  Will discuss her case further with Dr. Clois - mobilize - pain control - DVT prophylaxis - PTOT  Penne MICAEL Sharps, MD/MSCR Department of Neurosurgery

## 2024-07-22 ENCOUNTER — Ambulatory Visit: Payer: Self-pay | Admitting: Neurosurgery

## 2024-07-22 ENCOUNTER — Inpatient Hospital Stay: Admitting: Anesthesiology

## 2024-07-22 ENCOUNTER — Encounter
Admission: EM | Disposition: A | Discharge status: Home / Self Care | Payer: Self-pay | Source: Home / Self Care | Attending: Student

## 2024-07-22 DIAGNOSIS — T8140XA Infection following a procedure, unspecified, initial encounter: Secondary | ICD-10-CM | POA: Diagnosis not present

## 2024-07-22 DIAGNOSIS — L0291 Cutaneous abscess, unspecified: Secondary | ICD-10-CM | POA: Diagnosis not present

## 2024-07-22 DIAGNOSIS — T8149XA Infection following a procedure, other surgical site, initial encounter: Secondary | ICD-10-CM | POA: Diagnosis not present

## 2024-07-22 DIAGNOSIS — L03312 Cellulitis of back [any part except buttock]: Secondary | ICD-10-CM | POA: Diagnosis not present

## 2024-07-22 DIAGNOSIS — L7682 Other postprocedural complications of skin and subcutaneous tissue: Secondary | ICD-10-CM | POA: Diagnosis not present

## 2024-07-22 HISTORY — PX: LUMBAR WOUND DEBRIDEMENT: SHX1988

## 2024-07-22 LAB — AEROBIC CULTURE W GRAM STAIN (SUPERFICIAL SPECIMEN)

## 2024-07-22 LAB — BASIC METABOLIC PANEL WITH GFR
Anion gap: 8 (ref 5–15)
BUN: 26 mg/dL — ABNORMAL HIGH (ref 8–23)
CO2: 24 mmol/L (ref 22–32)
Calcium: 9.1 mg/dL (ref 8.9–10.3)
Chloride: 109 mmol/L (ref 98–111)
Creatinine, Ser: 1.56 mg/dL — ABNORMAL HIGH (ref 0.44–1.00)
GFR, Estimated: 36 mL/min — ABNORMAL LOW (ref >60)
Glucose, Bld: 160 mg/dL — ABNORMAL HIGH (ref 70–99)
Potassium: 4.4 mmol/L (ref 3.5–5.1)
Sodium: 141 mmol/L (ref 135–145)

## 2024-07-22 LAB — CBC
HCT: 32.2 % — ABNORMAL LOW (ref 36.0–46.0)
Hemoglobin: 10.4 g/dL — ABNORMAL LOW (ref 12.0–15.0)
MCH: 31 pg (ref 26.0–34.0)
MCHC: 32.3 g/dL (ref 30.0–36.0)
MCV: 95.8 fL (ref 80.0–100.0)
Platelets: 206 K/uL (ref 150–400)
RBC: 3.36 MIL/uL — ABNORMAL LOW (ref 3.87–5.11)
RDW: 14.5 % (ref 11.5–15.5)
WBC: 5.3 K/uL (ref 4.0–10.5)
nRBC: 0 % (ref 0.0–0.2)

## 2024-07-22 LAB — GLUCOSE, CAPILLARY
Glucose-Capillary: 103 mg/dL — ABNORMAL HIGH (ref 70–99)
Glucose-Capillary: 125 mg/dL — ABNORMAL HIGH (ref 70–99)
Glucose-Capillary: 195 mg/dL — ABNORMAL HIGH (ref 70–99)
Glucose-Capillary: 258 mg/dL — ABNORMAL HIGH (ref 70–99)

## 2024-07-22 SURGERY — LUMBAR WOUND DEBRIDEMENT
Anesthesia: General

## 2024-07-22 MED ORDER — FENTANYL CITRATE (PF) 100 MCG/2ML IJ SOLN: 25.0000 ug | INTRAMUSCULAR | Status: DC | PRN

## 2024-07-22 MED ORDER — FENTANYL CITRATE (PF) 100 MCG/2ML IJ SOLN
INTRAMUSCULAR | Status: AC
  Filled 2024-07-22: qty 2

## 2024-07-22 MED ORDER — SODIUM CHLORIDE (PF) 0.9 % IJ SOLN
INTRAMUSCULAR | Status: AC
  Filled 2024-07-22: qty 20

## 2024-07-22 MED ORDER — REMIFENTANIL HCL 1 MG IV SOLR
INTRAVENOUS | Status: AC
  Filled 2024-07-22: qty 1000

## 2024-07-22 MED ORDER — ROCURONIUM BROMIDE 100 MG/10ML IV SOLN
INTRAVENOUS | Status: DC | PRN
  Administered 2024-07-22: 60 mg via INTRAVENOUS

## 2024-07-22 MED ORDER — REMIFENTANIL HCL 1 MG IV SOLR
INTRAVENOUS | Status: DC | PRN
  Administered 2024-07-22: .08 ug/kg/min via INTRAVENOUS

## 2024-07-22 MED ORDER — SUGAMMADEX SODIUM 200 MG/2ML IV SOLN
INTRAVENOUS | Status: DC | PRN
  Administered 2024-07-22: 200 mg via INTRAVENOUS

## 2024-07-22 MED ORDER — BUPIVACAINE-EPINEPHRINE (PF) 0.5% -1:200000 IJ SOLN
INTRAMUSCULAR | Status: AC
  Filled 2024-07-22: qty 20

## 2024-07-22 MED ORDER — OXYCODONE HCL 5 MG PO TABS
5.0000 mg | ORAL_TABLET | ORAL | Status: DC | PRN
Start: 2024-07-22 — End: 2024-07-22

## 2024-07-22 MED ORDER — VANCOMYCIN HCL 1000 MG IV SOLR
INTRAVENOUS | Status: DC | PRN
  Administered 2024-07-22: 1000 mg

## 2024-07-22 MED ORDER — OXYCODONE HCL 5 MG/5ML PO SOLN: 5.0000 mg | Freq: Once | ORAL | Status: DC | PRN

## 2024-07-22 MED ORDER — OXYCODONE HCL 5 MG PO TABS: 5.0000 mg | ORAL_TABLET | Freq: Once | ORAL | Status: DC | PRN

## 2024-07-22 MED ORDER — PROPOFOL 10 MG/ML IV BOLUS
INTRAVENOUS | Status: DC | PRN
  Administered 2024-07-22: 200 mg via INTRAVENOUS

## 2024-07-22 MED ORDER — VASOPRESSIN 20 UNIT/ML IV SOLN
INTRAVENOUS | Status: DC | PRN
  Administered 2024-07-22 (×2): 2 [IU] via INTRAVENOUS

## 2024-07-22 MED ORDER — MIDAZOLAM HCL 2 MG/2ML IJ SOLN
INTRAMUSCULAR | Status: AC
  Filled 2024-07-22: qty 2

## 2024-07-22 MED ORDER — DEXAMETHASONE SODIUM PHOSPHATE 10 MG/ML IJ SOLN
INTRAMUSCULAR | Status: DC | PRN
  Administered 2024-07-22: 10 mg via INTRAVENOUS

## 2024-07-22 MED ORDER — HYDROMORPHONE HCL 1 MG/ML IJ SOLN: 1.0000 mg | INTRAMUSCULAR | Status: DC | PRN

## 2024-07-22 MED ORDER — SODIUM CHLORIDE 0.9 % IV SOLN
INTRAVENOUS | Status: DC | PRN
Start: 2024-07-22 — End: 2024-07-22

## 2024-07-22 MED ORDER — PROPOFOL 10 MG/ML IV BOLUS
INTRAVENOUS | Status: AC
Start: 2024-07-22 — End: 2024-07-22
  Filled 2024-07-22: qty 20

## 2024-07-22 MED ORDER — 0.9 % SODIUM CHLORIDE (POUR BTL) OPTIME
TOPICAL | Status: DC | PRN
  Administered 2024-07-22: 1000 mL

## 2024-07-22 MED ORDER — OXYCODONE HCL 5 MG PO TABS: 10.0000 mg | ORAL_TABLET | ORAL | Status: DC | PRN

## 2024-07-22 MED ORDER — OXYCODONE HCL 5 MG PO TABS
5.0000 mg | ORAL_TABLET | ORAL | Status: DC | PRN
Start: 2024-07-22 — End: 2024-07-23

## 2024-07-22 MED ORDER — MIDAZOLAM HCL 2 MG/2ML IJ SOLN
INTRAMUSCULAR | Status: DC | PRN
  Administered 2024-07-22: 2 mg via INTRAVENOUS

## 2024-07-22 MED ORDER — MORPHINE SULFATE (PF) 2 MG/ML IV SOLN: 2.0000 mg | INTRAVENOUS | Status: DC | PRN

## 2024-07-22 MED ORDER — GLYCOPYRROLATE 0.2 MG/ML IJ SOLN
INTRAMUSCULAR | Status: DC | PRN
  Administered 2024-07-22: .2 mg via INTRAVENOUS

## 2024-07-22 MED ORDER — VASOPRESSIN 20 UNIT/ML IV SOLN
INTRAVENOUS | Status: AC
  Filled 2024-07-22: qty 1

## 2024-07-22 MED ORDER — LIDOCAINE HCL (CARDIAC) PF 100 MG/5ML IV SOSY
PREFILLED_SYRINGE | INTRAVENOUS | Status: DC | PRN
  Administered 2024-07-22: 100 mg via INTRAVENOUS

## 2024-07-22 MED ORDER — VANCOMYCIN HCL 1000 MG IV SOLR
INTRAVENOUS | Status: AC
  Filled 2024-07-22: qty 20

## 2024-07-22 MED ORDER — EPHEDRINE SULFATE-NACL 50-0.9 MG/10ML-% IV SOSY
PREFILLED_SYRINGE | INTRAVENOUS | Status: DC | PRN
  Administered 2024-07-22: 5 mg via INTRAVENOUS
  Administered 2024-07-22: 10 mg via INTRAVENOUS

## 2024-07-22 MED ORDER — ONDANSETRON HCL 4 MG/2ML IJ SOLN
INTRAMUSCULAR | Status: DC | PRN
Start: 2024-07-22 — End: 2024-07-22
  Administered 2024-07-22: 4 mg via INTRAVENOUS

## 2024-07-22 MED ORDER — FENTANYL CITRATE (PF) 100 MCG/2ML IJ SOLN
INTRAMUSCULAR | Status: DC | PRN
  Administered 2024-07-22: 100 ug via INTRAVENOUS

## 2024-07-22 SURGICAL SUPPLY — 28 items
BASIN KIT SINGLE STR (MISCELLANEOUS) ×1 IMPLANT
BLADE BOVIE TIP EXT 4 (BLADE) IMPLANT
BRUSH SCRUB EZ 4% CHG (MISCELLANEOUS) ×1 IMPLANT
BUR NEURO DRILL SOFT 3.0X3.8M (BURR) IMPLANT
DERMABOND ADVANCED .7 DNX12 (GAUZE/BANDAGES/DRESSINGS) ×1 IMPLANT
DERMABOND ADVANCED .7 DNX6 (GAUZE/BANDAGES/DRESSINGS) IMPLANT
DRAPE LAPAROTOMY 100X77 ABD (DRAPES) ×1 IMPLANT
DRSG OPSITE POSTOP 4X6 (GAUZE/BANDAGES/DRESSINGS) IMPLANT
DRSG TEGADERM 4X4.75 (GAUZE/BANDAGES/DRESSINGS) IMPLANT
ELECTRODE REM PT RTRN 9FT ADLT (ELECTROSURGICAL) ×1 IMPLANT
GLOVE BIOGEL PI IND STRL 8 (GLOVE) ×2 IMPLANT
GLOVE SURG SYN 7.5 PF PI (GLOVE) ×2 IMPLANT
GOWN STRL REUS W/ TWL XL LVL3 (GOWN DISPOSABLE) ×2 IMPLANT
KIT TURNOVER KIT A (KITS) ×1 IMPLANT
KIT WILSON FRAME (KITS) ×1 IMPLANT
MANIFOLD NEPTUNE II (INSTRUMENTS) ×1 IMPLANT
NDL SAFETY ECLIPSE 18X1.5 (NEEDLE) ×1 IMPLANT
NS IRRIG 1000ML POUR BTL (IV SOLUTION) ×1 IMPLANT
PACK LAMINECTOMY ARMC (PACKS) ×1 IMPLANT
PAD ARMBOARD POSITIONER FOAM (MISCELLANEOUS) ×2 IMPLANT
SOLUTION IRRIG SURGIPHOR (IV SOLUTION) ×1 IMPLANT
SURGIFLO W/THROMBIN 8M KIT (HEMOSTASIS) IMPLANT
SUT STRATA 3-0 15 PS-2 (SUTURE) ×1 IMPLANT
SUT VIC AB 2-0 CT1 18 (SUTURE) ×1 IMPLANT
SYR 30ML LL (SYRINGE) IMPLANT
TIP FAN IRRIG PULSAVAC PLUS (DISPOSABLE) IMPLANT
TRAP FLUID SMOKE EVACUATOR (MISCELLANEOUS) ×1 IMPLANT
WATER STERILE IRR 500ML POUR (IV SOLUTION) ×1 IMPLANT

## 2024-07-22 NOTE — Anesthesia Postprocedure Evaluation (Signed)
 Anesthesia Post Note  Patient: Melody Brooks  Procedure(s) Performed: Wound Washout, IPG site  Patient location during evaluation: PACU Anesthesia Type: General Level of consciousness: awake and alert Pain management: pain level controlled Vital Signs Assessment: post-procedure vital signs reviewed and stable Respiratory status: spontaneous breathing, nonlabored ventilation, respiratory function stable and patient connected to nasal cannula oxygen Cardiovascular status: blood pressure returned to baseline and stable Postop Assessment: no apparent nausea or vomiting Anesthetic complications: no   No notable events documented.   Last Vitals:  Vitals:   07/22/24 1345 07/22/24 1419  BP: 128/61 (!) 131/50  Pulse: 64 62  Resp: 20 18  Temp: (!) 36.4 C (!) 35.6 C  SpO2: 95% 98%    Last Pain:  Vitals:   07/22/24 1345  TempSrc:   PainSc: 0-No pain                 [name]

## 2024-07-22 NOTE — Transfer of Care (Signed)
 Immediate Anesthesia Transfer of Care Note  Patient: Melody Brooks  Procedure(s) Performed: Wound Washout, IPG site  Patient Location: PACU  Anesthesia Type:General  Level of Consciousness: awake, alert , and oriented  Airway & Oxygen Therapy: Patient Spontanous Breathing and Patient connected to face mask oxygen  Post-op Assessment: Report given to RN and Post -op Vital signs reviewed and stable  Post vital signs: Reviewed and stable  Last Vitals:  Vitals Value Taken Time  BP 97/65 07/22/24 13:23  Temp 36.2 C 07/22/24 13:23  Pulse 64 07/22/24 13:26  Resp 14 07/22/24 13:26  SpO2 92 % 07/22/24 13:26  Vitals shown include unfiled device data.  Last Pain:  Vitals:   07/22/24 1323  TempSrc:   PainSc: Asleep      Patients Stated Pain Goal: 0 (07/20/24 0810)  Complications: No notable events documented.

## 2024-07-22 NOTE — Brief Op Note (Signed)
 07/22/2024  12:58 PM  PATIENT:  Melody Brooks  68 y.o. female  PRE-OPERATIVE DIAGNOSIS:  Wound Infection and Dehiscence  POST-OPERATIVE DIAGNOSIS:  Wound Infection and Dehiscence  PROCEDURE:  Procedure(s): Wound Washout, IPG site (N/A)  SURGEON:  Surgeons and Role:    * Claudene Penne ORN, MD - Primary  PHYSICIAN ASSISTANT:   ASSISTANTS: none   ANESTHESIA:   general  EBL: Minimal  BLOOD ADMINISTERED:none  DRAINS: none   LOCAL MEDICATIONS USED:  NONE  SPECIMEN:  Source of Specimen:  IPG site  DISPOSITION OF SPECIMEN:  Microbiology  COUNTS:  YES  TOURNIQUET:  * No tourniquets in log *  DICTATION: .Dragon Dictation  PLAN OF CARE: Return to floor  PATIENT DISPOSITION:  PACU - hemodynamically stable.   Delay start of Pharmacological VTE agent (>24hrs) due to surgical blood loss or risk of bleeding: no

## 2024-07-22 NOTE — Op Note (Addendum)
 Indications: the patient is a 68yo female who was diagnosed with a superficial wound dehiscence with MRSA colonization.  She was doing well postoperatively until she had a superficial dehiscence and noticed some draining from her wound.   Findings: Small stitch granuloma with small localized area of purulence immediately deep to the skin  Preoperative Diagnosis: Wound Infection and Dehiscence  Postoperative Diagnosis: Wound Infection and Dehiscence   Procedure: Wound washout of IPG site, superficial.   EBL: Minimal IVF: see anesthesia record Drains: none Disposition: Extubated and Stable to PACU Complications: none   No foley catheter was placed.     Preoperative Note:    Risks of surgery discussed in hospital   Operative Note:      The patient was then brought from the preoperative center with intravenous access established.  The patient underwent general anesthesia and endotracheal tube intubation, then was rotated on the OR table with a Wilson frame  The skin was then thoroughly cleansed.  Perioperative antibiotic prophylaxis was administered.  Sterile prep and drapes were then applied and a timeout was then observed.     Once this was complete an incision was opened with the use of a #10 blade knife in right flank incision.  The entirety of the initial incision was opened, at the area of dehiscence we were able to find a small stitch granuloma that had some localized area of purulence.  This was sent for tissue culture.  The area was also then swabbed as well.  An aggressive debridement was performed throughout the area of opening.  The tissue was cut back until fresh fat tissue and epidermal tissue were noted.  The wound was copiously irrigated.  Hemostasis was meticulous.   within the wound area vancomycin  powder was placed.  Any exposed suture was removed.  There is no evidence of any deep tracking down to the battery site.  The wound was explored meticulously.  Manual  pressure was used to try and express any more fluid collection but none was encountered.  At this point we closed in multiple layers as discussed in preop with the family the goal was to leave the stimulator if no clear infection track was leading down into its pocket.  With a clear area of superficial infection around the stitch granuloma I felt that it was reasonable to culture, aggressively debride the wound, clean the wound with copious irrigation and place antibiosis directly within the incision to give a chance for us  to maintain the system.  I closed in multiple layers, superficial was closed with glue.  Sterile dressing was placed.  Family did express their understanding of the risk that there may be infection that we cannot see and that we would need to remove it in the future.  However she will remain on antibiotics at the direction of our infectious disease doctors.  I performed this procedure without an assistant surgeon  Melody MICAEL Sharps, MD

## 2024-07-22 NOTE — Anesthesia Preprocedure Evaluation (Signed)
 Anesthesia Evaluation  Patient identified by MRN, date of birth, ID band Patient awake    Reviewed: Allergy & Precautions, NPO status , Patient's Chart, lab work & pertinent test results  Airway Mallampati: III  TM Distance: >3 FB Neck ROM: full    Dental  (+) Missing   Pulmonary neg pulmonary ROS   Pulmonary exam normal        Cardiovascular hypertension, negative cardio ROS Normal cardiovascular exam     Neuro/Psych  Headaches  Neuromuscular disease  negative psych ROS   GI/Hepatic Neg liver ROS, hiatal hernia,GERD  ,,  Endo/Other  negative endocrine ROSdiabetes, Well Controlled, Type 2, Oral Hypoglycemic Agents    Renal/GU ARF and CRFRenal disease     Musculoskeletal   Abdominal   Peds  Hematology  (+) Blood dyscrasia, anemia   Anesthesia Other Findings Past Medical History: No date: Anemia No date: Aneurysm (HCC)     Comment:  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 pt stated              on around October of 2022 No date: Angioedema 07/08/2015: Arterial fibromuscular dysplasia (HCC) No date: Arthritis No date: Cancer (HCC) 12/16/2017: Carotid stenosis No date: Chronic kidney disease 03/20/2024: Chronic pain syndrome 09/10/2019: Chronic urticaria No date: Constipation 07/17/2003: Diabetes mellitus with polyneuropathy (HCC) No date: Fatigue No date: Fibromuscular dysplasia (HCC) No date: Generalized headaches No date: GERD (gastroesophageal reflux disease) No date: Hemorrhoids No date: Hiatal hernia No date: Hypertension 07/30/2002: Idiopathic scoliosis 03/20/2024: Lumbar facet arthropathy 12/11/2014: Lumbosacral radiculitis No date: Neuropathy 06/13/2024: Nose colonized with MRSA     Comment:  a.) presurgical PCR (+) 06/13/2024 prior to THORACIC               LAMINECTOMY FOR SCS IMPLANTATION No date: Palpitations No date: Pneumonia No date: Rectal bleeding No date: Rectal  pain 03/20/2024: Spinal stenosis of lumbar region without neurogenic  claudication  Past Surgical History: No date: Basal cell cancer  removal 2010: colonoscopy 12/23/2022: EXCISION/RELEASE BURSA HIP; Right     Comment:  Procedure: OPEN EXCISION OF RIGHT TROCHANTERIC BURSA;                Surgeon: Edie Norleen PARAS, MD;  Location: [facility] ORS;                Service: Orthopedics;  Laterality: Right; 03/2012: EXCISIONAL HEMORRHOIDECTOMY 09/04/2021: IR ANGIO EXTERNAL CAROTID SEL EXT CAROTID BILAT MOD SED 09/04/2021: IR ANGIO INTRA EXTRACRAN SEL INTERNAL CAROTID BILAT MOD SED 09/04/2021: IR ANGIO VERTEBRAL SEL SUBCLAVIAN INNOMINATE UNI L MOD SED 09/04/2021: IR ANGIO VERTEBRAL SEL VERTEBRAL UNI R MOD SED 09/04/2021: IR US  GUIDE VASC ACCESS RIGHT 2006, 2008: right and left eye cataract surgery     Comment:  right in 2006 and left 2008 06/22/2024: THORACIC LAMINECTOMY FOR SPINAL CORD STIMULATOR; N/A     Comment:  Procedure: THORACIC LAMINECTOMY FOR SPINAL CORD               STIMULATOR;  Surgeon: Clois Fret, MD;  Location:               [facility] ORS;  Service: Neurosurgery;  Laterality: N/A;                THORACIC LAMINECTOMY FOR SPINAL CORD STIMULATOR PLACEMENT 1995: TUBAL LIGATION  BMI    Body Mass Index: 36.49 kg/m      Reproductive/Obstetrics negative OB ROS  Anesthesia Physical Anesthesia Plan  ASA: 3  Anesthesia Plan: General ETT   Post-op Pain Management: Ofirmev  IV (intra-op)* and Dilaudid  IV   Induction: Intravenous  PONV Risk Score and Plan: 3 and Ondansetron , Dexamethasone , Midazolam  and Treatment may vary due to age or medical condition  Airway Management Planned: Oral ETT  Additional Equipment:   Intra-op Plan:   Post-operative Plan: Extubation in OR  Informed Consent: I have reviewed the patients History and Physical, chart, labs and discussed the procedure including the risks, benefits and alternatives for the  proposed anesthesia with the patient or authorized representative who has indicated his/her understanding and acceptance.     Dental Advisory Given  Plan Discussed with: Anesthesiologist, CRNA and Surgeon  Anesthesia Plan Comments: (Patient consented for risks of anesthesia including but not limited to:  - adverse reactions to medications - damage to eyes, teeth, lips or other oral mucosa - nerve damage due to positioning  - sore throat or hoarseness - Damage to heart, brain, nerves, lungs, other parts of body or loss of life  Patient voiced understanding and assent.)         Anesthesia Quick Evaluation

## 2024-07-22 NOTE — Anesthesia Procedure Notes (Signed)
 Procedure Name: Intubation Date/Time: 07/22/2024 12:15 PM  Performed by: Jaylene Nest, CRNAPre-anesthesia Checklist: Patient identified, Patient being monitored, Timeout performed, Emergency Drugs available and Suction available Patient Re-evaluated:Patient Re-evaluated prior to induction Oxygen Delivery Method: Circle system utilized Preoxygenation: Pre-oxygenation with 100% oxygen Induction Type: IV induction Ventilation: Mask ventilation without difficulty Laryngoscope Size: 3 and McGrath Grade View: Grade I Tube type: Oral Tube size: 7.0 mm Number of attempts: 1 Airway Equipment and Method: Stylet and Video-laryngoscopy Placement Confirmation: ETT inserted through vocal cords under direct vision, positive ETCO2 and breath sounds checked- equal and bilateral Secured at: 21 cm Tube secured with: Tape Dental Injury: Teeth and Oropharynx as per pre-operative assessment

## 2024-07-22 NOTE — Progress Notes (Signed)
 Attending Progress Note  History: Melody Brooks is here for wound infection after a surgical placement of a spinal cord stimulator.  She has been admitted for IV antibiotics. She has had an improvement in drainage and redness, bu there continues to be some drainage noted on palpation and on her dressing. Culture came back MRSA positive.   Hospital Day: 5  Physical Exam: Vitals:   07/22/24 0405 07/22/24 0818  BP: (!) 125/52 (!) 111/43  Pulse: 66 90  Resp: 16 16  Temp: (!) 96.8 F (36 C) 97.8 F (36.6 C)  SpO2: 100% 95%    AA Ox3 CNI  Incision: lessened drainage, decreasing erythema.   Data:  Recent Labs  Lab 07/20/24 0449 07/21/24 0311 07/22/24 0420  NA 140 142 141  K 3.5 3.6 4.4  CL 111 109 109  CO2 21* 23 24  BUN 28* 24* 26*  CREATININE 1.34* 1.39* 1.56*  GLUCOSE 119* 126* 160*  CALCIUM  9.0 9.1 9.1   Recent Labs  Lab 07/18/24 1418  AST 15  ALT 11  ALKPHOS 67     Recent Labs  Lab 07/20/24 0449 07/21/24 0311 07/22/24 0420  WBC 5.3 5.8 5.3  HGB 10.2* 10.1* 10.4*  HCT 31.5* 31.4* 32.2*  PLT 200 214 206   No results for input(s): APTT, INR in the last 168 hours.      DG Foot 2 Views Right Result Date: 07/21/2024 CLINICAL DATA:  Right foot wound at base of fifth metatarsal. EXAM: RIGHT FOOT - 2 VIEW COMPARISON:  05/09/2020. FINDINGS: There is a displaced fracture at the base of the fifth metatarsal, indeterminate in age. No dislocation is seen. There is moderate calcaneal spurring. Degenerative changes are noted in the midfoot. The visualized soft tissues are within normal limits. IMPRESSION: Displaced fracture at the base of the proximal fifth metatarsal. Electronically Signed   By: Leita Birmingham M.D.   On: 07/21/2024 19:00    Other tests/results:  Recent Results (from the past 240 hours)  Blood culture (single)     Status: None (Preliminary result)   Collection Time: 07/18/24  2:18 PM   Specimen: BLOOD  Result Value Ref Range Status   Specimen  Description BLOOD BLOOD RIGHT ARM  Final   Special Requests   Final    BOTTLES DRAWN AEROBIC AND ANAEROBIC Blood Culture adequate volume   Culture   Final    NO GROWTH 4 DAYS Performed at [facility], [address] [place], [place] [address]    Report Status PENDING  Incomplete  Aerobic Culture w Gram Stain (superficial specimen)     Status: None (Preliminary result)   Collection Time: 07/19/24  6:06 PM   Specimen: Back; Wound  Result Value Ref Range Status   Specimen Description   Final    BACK Performed at [facility], [address], [place], [place] [address]    Special Requests   Final    NONE Performed at [facility], [address] [place], [place] [address]    Gram Stain   Final    ABUNDANT WBC PRESENT, PREDOMINANTLY PMN NO ORGANISMS SEEN    Culture   Final    FEW STAPHYLOCOCCUS AUREUS CULTURE REINCUBATED FOR BETTER GROWTH Performed at [facility], [address], [place], [place] [address]    Report Status PENDING  Incomplete  Aerobic Culture w Gram Stain (superficial specimen)     Status: None   Collection Time: 07/19/24  6:07 PM  Specimen: Wound  Result Value Ref Range Status   Specimen Description   Final    WOUND Performed at [facility], [address] [place], [place] [address]    Special Requests   Final    RIGHT FOOT Performed at [facility], [address] [place], [place] [address]    Gram Stain   Final    RARE WBC PRESENT, PREDOMINANTLY PMN FEW GRAM POSITIVE COCCI FEW GRAM NEGATIVE RODS Performed at [facility], [address] [place], [place] [address]    Culture   Final    ABUNDANT PSEUDOMONAS AERUGINOSA ABUNDANT METHICILLIN RESISTANT STAPHYLOCOCCUS AUREUS    Report Status 07/22/2024 FINAL  Final   Organism ID, Bacteria PSEUDOMONAS AERUGINOSA  Final   Organism ID, Bacteria METHICILLIN RESISTANT STAPHYLOCOCCUS AUREUS  Final      Susceptibility   Methicillin  resistant staphylococcus aureus - MIC*    CIPROFLOXACIN >=8 RESISTANT Resistant     ERYTHROMYCIN >=8 RESISTANT Resistant     GENTAMICIN <=0.5 SENSITIVE Sensitive     OXACILLIN >=4 RESISTANT Resistant     TETRACYCLINE <=1 SENSITIVE Sensitive     VANCOMYCIN  1 SENSITIVE Sensitive     TRIMETH /SULFA  >=320 RESISTANT Resistant     CLINDAMYCIN <=0.25 SENSITIVE Sensitive     RIFAMPIN <=0.5 SENSITIVE Sensitive     Inducible Clindamycin NEGATIVE Sensitive     LINEZOLID  2 SENSITIVE Sensitive     * ABUNDANT METHICILLIN RESISTANT STAPHYLOCOCCUS AUREUS   Pseudomonas aeruginosa - MIC*    MEROPENEM <=0.25 SENSITIVE Sensitive     CIPROFLOXACIN 0.12 SENSITIVE Sensitive     IMIPENEM 2 SENSITIVE Sensitive     PIP/TAZO Value in next row Sensitive      8 SENSITIVEThis is a modified FDA-approved test that has been validated and its performance characteristics determined by the reporting laboratory.  This laboratory is certified under the Clinical Laboratory Improvement Amendments CLIA as qualified to perform high complexity clinical laboratory testing.    CEFEPIME  Value in next row Sensitive      8 SENSITIVEThis is a modified FDA-approved test that has been validated and its performance characteristics determined by the reporting laboratory.  This laboratory is certified under the Clinical Laboratory Improvement Amendments CLIA as qualified to perform high complexity clinical laboratory testing.    CEFTAZIDIME/AVIBACTAM Value in next row Sensitive      8 SENSITIVEThis is a modified FDA-approved test that has been validated and its performance characteristics determined by the reporting laboratory.  This laboratory is certified under the Clinical Laboratory Improvement Amendments CLIA as qualified to perform high complexity clinical laboratory testing.    CEFTOLOZANE/TAZOBACTAM Value in next row Sensitive      8 SENSITIVEThis is a modified FDA-approved test that has been validated and its performance characteristics  determined by the reporting laboratory.  This laboratory is certified under the Clinical Laboratory Improvement Amendments CLIA as qualified to perform high complexity clinical laboratory testing.    TOBRAMYCIN Value in next row Sensitive      8 SENSITIVEThis is a modified FDA-approved test that has been validated and its performance characteristics determined by the reporting laboratory.  This laboratory is certified under the Clinical Laboratory Improvement Amendments CLIA as qualified to perform high complexity clinical laboratory testing.    CEFTAZIDIME Value in next row Sensitive      8 SENSITIVEThis is a modified FDA-approved test that has been validated and its performance characteristics determined by the reporting laboratory.  This laboratory is certified under the Clinical  Laboratory Improvement Amendments CLIA as qualified to perform high complexity clinical laboratory testing.    * ABUNDANT PSEUDOMONAS AERUGINOSA     Assessment/Plan:  SINAYA MINOGUE 68 year old woman with a history of spinal cord stimulator.  She has risk factors including diabetes, wound healing issues.  She presented with drainage at her battery site.    - Micro: Appreciate ID recs.  Wound culture with PMNs and MRSA and Pseudomonas.  - Will discuss washout vs removal, patient and family favoring washout to see if there is a chance to save. I did discuss with them that if the infection tracked deeper that I would remove the entire system.  - mobilize - pain control - DVT prophylaxis - PTOT  Penne MICAEL Sharps, MD/MSCR Department of Neurosurgery

## 2024-07-22 NOTE — Progress Notes (Signed)
 Progress Note   Patient: Melody Brooks FMW:[id number] DOB: 1956-07-30 DOA: 07/18/2024     4 DOS: the patient was seen and examined on 07/22/2024   Brief hospital course: Per H&P HPI   Melody Brooks is a 68 y.o. Caucasian female with medical history significant for osteoarthritis, type diabetes mellitus with peripheral neuropathy, chronic kidney disease, essential hypertension, GERD with hiatal hernia, and lumbar spinal stenosis, who recently underwent implant of spinal nerve stimulator in about a couple of days ago, she started noticing redness around her lower lumbar incision with mild bloody drainage.  This has been getting worse and she was concerned about infected wound.  She denied any fever or chills.  No nausea or vomiting or abdominal pain.  No chest pain or palpitations.  No cough or wheezing or dyspnea.  No dysuria, oliguria or hematuria or flank pain.  She was seen in neurosurgical clinic today and then sent to the emergency room to get a CT scan to see extent of infection.   Assessment and Plan: * Spinal cord stimulator surgical site infection Likely superficial. Patient noted to have some purulent drainage that was noted at outpatient NSGY appointment.  Cultures were obtained then growing MRSA as of 9/21 AM  CT L spine with no drainable fluid collection.  She is currently receiving vancomycin  and cefepeime.  Neurosurgery aware of cultures and plan for OR this PM -continue IV antibiotics    Type 2 diabetes mellitus with peripheral neuropathy (HCC) Well-controlled A1c 5.7 - Sliding scale insulin  while inpatient  CBG (last 3)  Recent Labs    07/21/24 2011 07/22/24 0820 07/22/24 1327  GLUCAP 134* 103* 125*    Chronic back pain Continue home pain medications  Anemia of chronic kidney disease Iron deficiency anemia     Latest Ref Rng & Units 07/22/2024    4:20 AM 07/21/2024    3:11 AM 07/20/2024    4:49 AM  CBC  WBC 4.0 - 10.5 K/uL 5.3  5.8  5.3   Hemoglobin 12.0 -  15.0 g/dL 89.5  89.8  89.7   Hematocrit 36.0 - 46.0 % 32.2  31.4  31.5   Platelets 150 - 400 K/uL 206  214  200    Continue home PO iron Follows with nephrology outpatient for ESA  CKD3b    Latest Ref Rng & Units 07/22/2024    4:20 AM 07/21/2024    3:11 AM 07/20/2024    4:49 AM  BMP  Glucose 70 - 99 mg/dL 839  873  880   BUN 8 - 23 mg/dL 26  24  28    Creatinine 0.44 - 1.00 mg/dL 8.43  8.60  8.65   Sodium 135 - 145 mmol/L 141  142  140   Potassium 3.5 - 5.1 mmol/L 4.4  3.6  3.5   Chloride 98 - 111 mmol/L 109  109  111   CO2 22 - 32 mmol/L 24  23  21    Calcium  8.9 - 10.3 mg/dL 9.1  9.1  9.0   Serum creatinine mildly elevated Will continue to monitor for now.   Dyslipidemia - Will continue statin therapy.  Essential hypertension - Will continue antihypertensive therapy.  Class 2 obesity  BMI is 36.49.  Complicates care. Patient currently on medications for help with this.  Phentermine  was held given interaction with linezolid .      Subjective: No issues overnight.   Physical Exam: Vitals:   07/22/24 1323 07/22/24 1330 07/22/24 1345 07/22/24 1419  BP:  97/65 120/65 128/61 (!) 131/50  Pulse: 65 63 64 62  Resp: 12 12 20 18   Temp: (!) 97.1 F (36.2 C)  (!) 97.5 F (36.4 C) (!) 96 F (35.6 C)  TempSrc:      SpO2: 92% 94% 95% 98%  Weight:      Height:         Constitutional: In no distress.  Cardiovascular: Normal rate, regular rhythm. No lower extremity edema  Pulmonary: Non labored breathing on room air, no wheezing or rales.   Abdominal: Soft. Non distended and non tender Musculoskeletal: Normal range of motion.     Neurological: Alert and oriented to person, place, and time. Non focal  Skin: Skin is warm and dry. R back wound R of midline, erythema continues to improve,   Data Reviewed:     Latest Ref Rng & Units 07/22/2024    4:20 AM 07/21/2024    3:11 AM 07/20/2024    4:49 AM  CBC  WBC 4.0 - 10.5 K/uL 5.3  5.8  5.3   Hemoglobin 12.0 - 15.0 g/dL 89.5  89.8   89.7   Hematocrit 36.0 - 46.0 % 32.2  31.4  31.5   Platelets 150 - 400 K/uL 206  214  200       Latest Ref Rng & Units 07/22/2024    4:20 AM 07/21/2024    3:11 AM 07/20/2024    4:49 AM  BMP  Glucose 70 - 99 mg/dL 839  873  880   BUN 8 - 23 mg/dL 26  24  28    Creatinine 0.44 - 1.00 mg/dL 8.43  8.60  8.65   Sodium 135 - 145 mmol/L 141  142  140   Potassium 3.5 - 5.1 mmol/L 4.4  3.6  3.5   Chloride 98 - 111 mmol/L 109  109  111   CO2 22 - 32 mmol/L 24  23  21    Calcium  8.9 - 10.3 mg/dL 9.1  9.1  9.0      Family Communication: None at bedside  Disposition: Status is: Inpatient Remains inpatient appropriate because: IV antibiotics.    Planned Discharge Destination: Home    Time spent: 35 minutes  Author: Alban Pepper, MD 07/22/2024 3:42 PM  For on call review [email or web address].

## 2024-07-23 ENCOUNTER — Telehealth: Payer: Self-pay

## 2024-07-23 ENCOUNTER — Encounter: Payer: Self-pay | Admitting: Neurosurgery

## 2024-07-23 ENCOUNTER — Other Ambulatory Visit: Payer: Self-pay

## 2024-07-23 DIAGNOSIS — T8149XA Infection following a procedure, other surgical site, initial encounter: Secondary | ICD-10-CM | POA: Diagnosis not present

## 2024-07-23 DIAGNOSIS — L03312 Cellulitis of back [any part except buttock]: Secondary | ICD-10-CM | POA: Diagnosis not present

## 2024-07-23 LAB — CBC WITH DIFFERENTIAL/PLATELET
Abs Immature Granulocytes: 0.05 K/uL (ref 0.00–0.07)
Basophils Absolute: 0 K/uL (ref 0.0–0.1)
Basophils Relative: 0 %
Eosinophils Absolute: 0 K/uL (ref 0.0–0.5)
Eosinophils Relative: 0 %
HCT: 32.9 % — ABNORMAL LOW (ref 36.0–46.0)
Hemoglobin: 11 g/dL — ABNORMAL LOW (ref 12.0–15.0)
Immature Granulocytes: 1 %
Lymphocytes Relative: 10 %
Lymphs Abs: 0.7 K/uL (ref 0.7–4.0)
MCH: 31.3 pg (ref 26.0–34.0)
MCHC: 33.4 g/dL (ref 30.0–36.0)
MCV: 93.5 fL (ref 80.0–100.0)
Monocytes Absolute: 0.4 K/uL (ref 0.1–1.0)
Monocytes Relative: 5 %
Neutro Abs: 6.1 K/uL (ref 1.7–7.7)
Neutrophils Relative %: 84 %
Platelets: 229 K/uL (ref 150–400)
RBC: 3.52 MIL/uL — ABNORMAL LOW (ref 3.87–5.11)
RDW: 14 % (ref 11.5–15.5)
WBC: 7.2 K/uL (ref 4.0–10.5)
nRBC: 0 % (ref 0.0–0.2)

## 2024-07-23 LAB — AEROBIC CULTURE W GRAM STAIN (SUPERFICIAL SPECIMEN)

## 2024-07-23 LAB — CULTURE, BLOOD (SINGLE)
Culture: NO GROWTH
Special Requests: ADEQUATE

## 2024-07-23 LAB — BASIC METABOLIC PANEL WITH GFR
Anion gap: 15 (ref 5–15)
BUN: 28 mg/dL — ABNORMAL HIGH (ref 8–23)
CO2: 19 mmol/L — ABNORMAL LOW (ref 22–32)
Calcium: 9.4 mg/dL (ref 8.9–10.3)
Chloride: 106 mmol/L (ref 98–111)
Creatinine, Ser: 1.46 mg/dL — ABNORMAL HIGH (ref 0.44–1.00)
GFR, Estimated: 39 mL/min — ABNORMAL LOW (ref >60)
Glucose, Bld: 200 mg/dL — ABNORMAL HIGH (ref 70–99)
Potassium: 4.4 mmol/L (ref 3.5–5.1)
Sodium: 140 mmol/L (ref 135–145)

## 2024-07-23 LAB — ANAEROBIC AND AEROBIC CULTURE

## 2024-07-23 LAB — IRON AND TIBC
Iron: 67 ug/dL (ref 28–170)
Saturation Ratios: 17 % (ref 10.4–31.8)
TIBC: 391 ug/dL (ref 250–450)
UIBC: 324 ug/dL

## 2024-07-23 LAB — GLUCOSE, CAPILLARY
Glucose-Capillary: 126 mg/dL — ABNORMAL HIGH (ref 70–99)
Glucose-Capillary: 117 mg/dL — ABNORMAL HIGH (ref 70–99)
Glucose-Capillary: 214 mg/dL — ABNORMAL HIGH (ref 70–99)

## 2024-07-23 MED ORDER — LINEZOLID 600 MG PO TABS
600.0000 mg | ORAL_TABLET | Freq: Two times a day (BID) | ORAL | 0 refills | Status: AC
  Filled 2024-07-23: qty 14, 7d supply, fill #0

## 2024-07-23 MED ORDER — LEVOFLOXACIN 500 MG PO TABS
500.0000 mg | ORAL_TABLET | Freq: Every day | ORAL | 0 refills | Status: DC
  Filled 2024-07-23: qty 7, 7d supply, fill #0

## 2024-07-23 MED ORDER — LINEZOLID 600 MG PO TABS
600.0000 mg | ORAL_TABLET | Freq: Two times a day (BID) | ORAL | Status: DC
Start: 2024-07-23 — End: 2024-07-23

## 2024-07-23 NOTE — Telephone Encounter (Signed)
-----   Message from Edsel Jama Goods sent at 07/23/2024 11:10 AM EDT ----- Regarding: RE: post op appts I would leave it as is to keep a close eye on it ----- Message ----- From: Lorey Pallett, RN Sent: 07/23/2024  10:38 AM EDT To: Edsel Jama Goods, PA Subject: post op appts                                  Melody Brooks placed her SCS on 8/22  Dr Claudene took her to the OR 9/21 for washout.  She has her 6 week post op appt on 10/2 with Dr Melody Brooks   How should we handle her post op appts?  Should I leave the 10/2 appt as is? It would be 10 days from her washout (by Claudene) and 6 weeks from her SCS placement (by Melody Brooks)  Should I also schedule an appt with Claudene?

## 2024-07-23 NOTE — Discharge Instructions (Addendum)
 Please take your antibiotics as prescribed. While you are on your antibiotics you will need to hold your phentermine . Please discuss with your primary care doctor about transitioning from these medications.   Please keep your follow up with neurosurgery 10/2.   Please keep your dressing in place on your back until 9/24. At that time it can be removed.

## 2024-07-23 NOTE — Progress Notes (Signed)
 PHARMACIST - PHYSICIAN COMMUNICATION CONCERNING: Antibiotic IV to Oral Route Change Policy  RECOMMENDATION: This patient is receiving linezolid  intravenously. Based on criteria approved by the Pharmacy and Therapeutics Committee, the antibiotic(s) is/are being converted to the equivalent dose of an oral formulation.   DESCRIPTION: These criteria include: Patient being treated for a respiratory tract infection, urinary tract infection, cellulitis or Clostridioides difficile-associated diarrhea if on metronidazole. The patient is not neutropenic and does not exhibit a malabsorptive GI state. The patient is eating (either orally or via tube) and/or has been taking other orally administered medications for at least 24 hours. The patient is improving clinically and has a 24-hour Tmax of <100.5 F.  If you have questions about this conversion, please contact the Pharmacy Department:  [x]   [phone] )  [facility] []   [phone] )  [facility] []   [phone] )  [facility]  []   [phone] )  [facility]   Will M. Lenon, PharmD, BCPS Clinical Pharmacist 07/23/2024 10:17 AM

## 2024-07-23 NOTE — Plan of Care (Signed)
  Problem: Education: Goal: Knowledge of General Education information will improve Description: Including pain rating scale, medication(s)/side effects and non-pharmacologic comfort measures Outcome: Progressing   Problem: Health Behavior/Discharge Planning: Goal: Ability to manage health-related needs will improve Outcome: Progressing   Problem: Clinical Measurements: Goal: Ability to maintain clinical measurements within normal limits will improve Outcome: Progressing Goal: Will remain free from infection Outcome: Progressing Goal: Diagnostic test results will improve Outcome: Progressing Goal: Respiratory complications will improve Outcome: Progressing Goal: Cardiovascular complication will be avoided Outcome: Progressing   Problem: Activity: Goal: Risk for activity intolerance will decrease Outcome: Progressing   Problem: Nutrition: Goal: Adequate nutrition will be maintained Outcome: Progressing   Problem: Coping: Goal: Level of anxiety will decrease Outcome: Progressing   Problem: Elimination: Goal: Will not experience complications related to bowel motility Outcome: Progressing Goal: Will not experience complications related to urinary retention Outcome: Progressing   Problem: Pain Managment: Goal: General experience of comfort will improve and/or be controlled Outcome: Progressing   Problem: Safety: Goal: Ability to remain free from injury will improve Outcome: Progressing   Problem: Skin Integrity: Goal: Risk for impaired skin integrity will decrease Outcome: Progressing   Problem: Clinical Measurements: Goal: Ability to avoid or minimize complications of infection will improve Outcome: Progressing   Problem: Skin Integrity: Goal: Skin integrity will improve Outcome: Progressing   Problem: Education: Goal: Ability to describe self-care measures that may prevent or decrease complications (Diabetes Survival Skills Education) will improve Outcome:  Progressing Goal: Individualized Educational Video(s) Outcome: Progressing   Problem: Coping: Goal: Ability to adjust to condition or change in health will improve Outcome: Progressing   Problem: Fluid Volume: Goal: Ability to maintain a balanced intake and output will improve Outcome: Progressing   Problem: Health Behavior/Discharge Planning: Goal: Ability to identify and utilize available resources and services will improve Outcome: Progressing Goal: Ability to manage health-related needs will improve Outcome: Progressing   Problem: Nutritional: Goal: Maintenance of adequate nutrition will improve Outcome: Progressing Goal: Progress toward achieving an optimal weight will improve Outcome: Progressing   Problem: Skin Integrity: Goal: Risk for impaired skin integrity will decrease Outcome: Progressing

## 2024-07-23 NOTE — Progress Notes (Signed)
   Neurosurgery Progress Note  History: Melody Brooks is s/p  superficial woud washout   POD1: NAEO. Doing well and eager to discharge home.   Physical Exam: Vitals:   07/22/24 2051 07/23/24 0354  BP: (!) 122/58 137/61  Pulse: 80 79  Resp:    Temp: (!) 97.4 F (36.3 C) 97.8 F (36.6 C)  SpO2: 100% 97%    AA Ox3 CNI  Strength:5/5 throughout   Incision c/d/I and covered with clean dressing   Data:  Other tests/results:     Latest Ref Rng & Units 07/23/2024    2:40 AM 07/22/2024    4:20 AM 07/21/2024    3:11 AM  CBC  WBC 4.0 - 10.5 K/uL 7.2  5.3  5.8   Hemoglobin 12.0 - 15.0 g/dL 88.9  89.5  89.8   Hematocrit 36.0 - 46.0 % 32.9  32.2  31.4   Platelets 150 - 400 K/uL 229  206  214       Latest Ref Rng & Units 07/23/2024    2:40 AM 07/22/2024    4:20 AM 07/21/2024    3:11 AM  BMP  Glucose 70 - 99 mg/dL 799  839  873   BUN 8 - 23 mg/dL 28  26  24    Creatinine 0.44 - 1.00 mg/dL 8.53  8.43  8.60   Sodium 135 - 145 mmol/L 140  141  142   Potassium 3.5 - 5.1 mmol/L 4.4  4.4  3.6   Chloride 98 - 111 mmol/L 106  109  109   CO2 22 - 32 mmol/L 19  24  23    Calcium  8.9 - 10.3 mg/dL 9.4  9.1  9.1      Assessment/Plan:  Melody Brooks is a 68 y.o presetig with MRSA wound infection s/p wash out.   - mobilize - pain control - DVT prophylaxis - PTOT as tolerated - ID consulted  - remainder of care per primary team - stable from neurosurgical standpoint for discharge. Outpatient follow up arranged   Edsel Goods PA-C Department of Neurosurgery

## 2024-07-23 NOTE — Progress Notes (Signed)
 Date of Admission:  07/18/2024      ID: Melody Brooks is a 68 y.o. female  Principal Problem:   Cellulitis of back Active Problems:   Essential hypertension   Dyslipidemia   Type 2 diabetes mellitus with peripheral neuropathy (HCC)   Surgical site infection    Subjective: Pt is fdoing fine Yesterday had I/D of the abscess which was superficial and hardware pocket not involved   Medications:   amLODipine   5 mg Oral Daily   aspirin  EC  81 mg Oral Daily   cholecalciferol   5,000 Units Oral Daily   cyclobenzaprine   10 mg Oral QHS   diphenhydrAMINE   25 mg Oral QHS   estrogen (conjugated)-medroxyprogesterone  1 tablet Oral Daily   famotidine   20 mg Oral QHS   ferrous sulfate   325 mg Oral QHS   furosemide   20 mg Oral Daily   insulin  aspart  0-15 Units Subcutaneous TID WC   leptospermum manuka honey  1 Application Topical Daily   linezolid   600 mg Oral Q12H   magnesium  oxide  400 mg Oral Daily   pantoprazole   40 mg Oral Daily   polyethylene glycol  17 g Oral Daily   pregabalin   100 mg Oral BID   propranolol  ER  60 mg Oral Daily   rosuvastatin   10 mg Oral Daily   spironolactone   25 mg Oral Daily   topiramate   50 mg Oral BID   zinc  sulfate (50mg  elemental zinc )  220 mg Oral Daily   zolpidem   5 mg Oral QHS    Objective: Vital signs in last 24 hours: Patient Vitals for the past 24 hrs:  BP Temp Temp src Pulse Resp SpO2  07/23/24 0823 120/69 98 F (36.7 C) -- 79 18 100 %  07/23/24 0354 137/61 97.8 F (36.6 C) -- 79 -- 97 %  07/22/24 2051 (!) 122/58 (!) 97.4 F (36.3 C) Oral 80 -- 100 %  07/22/24 1649 (!) 120/56 97.8 F (36.6 C) -- 66 18 100 %     PHYSICAL EXAM:  General: Alert, cooperative, no distress, appears stated age.  Lungs: Clear to auscultation bilaterally. No Wheezing or Rhonchi. No rales. Heart: Regular rate and rhythm, no murmur, rub or gallop. Abdomen: Soft, non-tender,not distended. Bowel sounds normal. No masses Extremities: rt foot lateral margin  ulcer Back- rt lumbar area- surgical dresisng Skin: No rashes or lesions. Or bruising Lymph: Cervical, supraclavicular normal. Neurologic: Grossly non-focal  Lab Results    Latest Ref Rng & Units 07/23/2024    2:40 AM 07/22/2024    4:20 AM 07/21/2024    3:11 AM  CBC  WBC 4.0 - 10.5 K/uL 7.2  5.3  5.8   Hemoglobin 12.0 - 15.0 g/dL 88.9  89.5  89.8   Hematocrit 36.0 - 46.0 % 32.9  32.2  31.4   Platelets 150 - 400 K/uL 229  206  214        Latest Ref Rng & Units 07/23/2024    2:40 AM 07/22/2024    4:20 AM 07/21/2024    3:11 AM  CMP  Glucose 70 - 99 mg/dL 799  839  873   BUN 8 - 23 mg/dL 28  26  24    Creatinine 0.44 - 1.00 mg/dL 8.53  8.43  8.60   Sodium 135 - 145 mmol/L 140  141  142   Potassium 3.5 - 5.1 mmol/L 4.4  4.4  3.6   Chloride 98 - 111 mmol/L 106  109  109   CO2 22 - 32 mmol/L 19  24  23    Calcium  8.9 - 10.3 mg/dL 9.4  9.1  9.1       Microbiology: MRSA lumbar wound MRSA /pseudomonas rt foot wound Studies/Results: DG Foot 2 Views Right Result Date: 07/21/2024 CLINICAL DATA:  Right foot wound at base of fifth metatarsal. EXAM: RIGHT FOOT - 2 VIEW COMPARISON:  05/09/2020. FINDINGS: There is a displaced fracture at the base of the fifth metatarsal, indeterminate in age. No dislocation is seen. There is moderate calcaneal spurring. Degenerative changes are noted in the midfoot. The visualized soft tissues are within normal limits. IMPRESSION: Displaced fracture at the base of the proximal fifth metatarsal. Electronically Signed   By: Leita Birmingham M.D.   On: 07/21/2024 19:00     Assessment/Plan: Permanent spinal cord stimulator placement on 06/22/2024 at  the level of T7-T8 disc space with removal of the spinous process at T9.   flank pocket created with the pulse generator on the right side lumbar area Surgical site infection Abscess with surrounding cellulitis  S/p I/D it was a superficial abscess and did not invlove the pocket   Pt currently on IV linezolid  and IV  ceftriaxone  culture MRSA Will send her on PO linezolid  for 1 more week   Discussed the side effcts of antibiotics especially linezolid  Not on any SSRIs Hold phenteramine while on linezolid    DM well controlled A1c was 5.9 Peripheral neuropathy  Chronic rt lateral wound- looks superficial- pseudomonas and MRSA doubt underlying bone involved- Xray foot displaced Fracture of base of left met Will give linezolid  and levaquin  for a week Will follow her as OP   CKD- avoid nephrotoxic meds   Htn on amlodipine  propanolol   HLD on crestor    ? Anemia Discussed the management with patient , her husband,in detail  Regarding MRSA, precautions, decolonization after completion of antibiotic meds and side effects of antibiotics  Discussed with  Dr.Carter and ID pharmacist   ? MALVA

## 2024-07-26 ENCOUNTER — Other Ambulatory Visit: Payer: Self-pay | Admitting: Infectious Diseases

## 2024-07-26 ENCOUNTER — Telehealth: Payer: Self-pay

## 2024-07-26 MED ORDER — LEVOFLOXACIN 500 MG PO TABS
500.0000 mg | ORAL_TABLET | Freq: Every day | ORAL | 0 refills | Status: AC
Start: 2024-07-26 — End: 2024-07-29

## 2024-07-26 NOTE — Discharge Summary (Signed)
 Physician Discharge Summary   Patient: Melody Brooks MRN: [id number] DOB: Jun 05, 1956  Admit date:     07/18/2024  Discharge date: 07/23/2024  Discharge Physician: Alban Pepper   PCP: Auston Reyes BIRCH, MD   Recommendations at discharge:    F/u with PCP check BMP ensure serum creatinine is stable   Discharge Diagnoses: Principal Problem:   Cellulitis of back Active Problems:   Essential hypertension   Dyslipidemia   Type 2 diabetes mellitus with peripheral neuropathy [facility])   Surgical site infection  Resolved Problems:   * No resolved hospital problems. [facility] Course: Per H&P HPI    Melody Brooks is a 68 y.o. Caucasian female with medical history significant for osteoarthritis, type diabetes mellitus with peripheral neuropathy, chronic kidney disease, essential hypertension, GERD with hiatal hernia, and lumbar spinal stenosis, who recently underwent implant of spinal nerve stimulator in about a couple of days ago, she started noticing redness around her lower lumbar incision with mild bloody drainage.  This has been getting worse and she was concerned about infected wound.  She denied any fever or chills.  No nausea or vomiting or abdominal pain.  No chest pain or palpitations.  No cough or wheezing or dyspnea.  No dysuria, oliguria or hematuria or flank pain.  She was seen in neurosurgical clinic today and then sent to the emergency room to get a CT scan to see extent of infection.   Assessment and Plan: Spinal cord stimulator surgical site infection Likely superficial. Patient noted to have some purulent drainage that was noted at outpatient NSGY appointment.  Cultures were obtained growing MRSA as of 9/21 AM  CT L spine with no drainable fluid collection.  She was receiving vancomycin  and cefepeime transitioned to linezolid  and ceftraixone.  S/p ID it appeared she had a superficial abscess that did not involved the pocket.  Continue linezolid  for 1 more week.    Chronic R lateral foot wound Superficial. Xray w/o evidence of osteo.  Per ID will receive linezolid  and levaquin  for this.      Type 2 diabetes mellitus with peripheral neuropathy (HCC) Well-controlled A1c 5.7 - Sliding scale insulin  while inpatient   CBG (last 3)  Recent Labs (last 2 labs)       Recent Labs    07/21/24 2011 07/22/24 0820 07/22/24 1327  GLUCAP 134* 103* 125*        Chronic back pain Continued home pain medications   Anemia of chronic kidney disease Iron deficiency anemia      Latest Ref Rng & Units 07/23/2024    2:40 AM 07/22/2024    4:20 AM 07/21/2024    3:11 AM  CBC  WBC 4.0 - 10.5 K/uL 7.2  5.3  5.8   Hemoglobin 12.0 - 15.0 g/dL 88.9  89.5  89.8   Hematocrit 36.0 - 46.0 % 32.9  32.2  31.4   Platelets 150 - 400 K/uL 229  206  214    Hgb stable.  Iron/TIBC/Ferritin/ %Sat    Component Value Date/Time   IRON 67 07/23/2024 0240   TIBC 391 07/23/2024 0240   FERRITIN 37 07/21/2024 0311   IRONPCTSAT 17 07/23/2024 0240    Continued home PO iron Follows with nephrology outpatient for ESA   CKD3b NAGMA    Latest Ref Rng & Units 07/23/2024    2:40 AM 07/22/2024    4:20 AM 07/21/2024    3:11 AM  BMP  Glucose 70 - 99 mg/dL 799  839  126   BUN 8 - 23 mg/dL 28  26  24    Creatinine 0.44 - 1.00 mg/dL 8.53  8.43  8.60   Sodium 135 - 145 mmol/L 140  141  142   Potassium 3.5 - 5.1 mmol/L 4.4  4.4  3.6   Chloride 98 - 111 mmol/L 106  109  109   CO2 22 - 32 mmol/L 19  24  23    Calcium  8.9 - 10.3 mg/dL 9.4  9.1  9.1   Serum creatinine near baseline. Patient with NAGMA in setting of CKD.     Dyslipidemia - Continued statin therapy.   Essential hypertension - Continued antihypertensive therapy.   Class 2 obesity  BMI is 36.49.  Complicates care. Patient currently on medications for help with this.  Phentermine  was held given interaction with linezolid .       Consultants: ID, NSGY Procedures performed: ID of back wound  Disposition: Home Diet  recommendation:  Carb modified diet DISCHARGE MEDICATION: Allergies as of 07/23/2024       Reactions   Lisinopril Hives, Itching, Swelling, Rash   Angioedema        Medication List     PAUSE taking these medications    phentermine  37.5 MG tablet Wait to take this until your doctor or other care provider tells you to start again. Commonly known as: ADIPEX-P  Take 37.5 mg by mouth every morning.       TAKE these medications    acetaminophen  500 MG tablet Commonly known as: TYLENOL  Take 1,000 mg by mouth every 6 (six) hours as needed for moderate pain.   amLODipine  5 MG tablet Commonly known as: NORVASC  Take 5 mg by mouth daily.   aspirin  81 MG tablet Take 81 mg by mouth daily.   B-12 IJ Inject 1,000 Units as directed every 14 (fourteen) days.   cyclobenzaprine  10 MG tablet Commonly known as: FLEXERIL  Take 10 mg by mouth at bedtime.   Dialyvite Vitamin D  5000 125 MCG (5000 UT) capsule Generic drug: Cholecalciferol  Take 5,000 Units by mouth daily.   diphenhydrAMINE  25 mg capsule Commonly known as: BENADRYL  Take 25 mg by mouth at bedtime.   EpiPen  2-Pak 0.3 MG/0.3ML Soaj injection Generic drug: EPINEPHrine  Inject 0.3 mg into the muscle as needed for anaphylaxis.   esomeprazole 20 MG capsule Commonly known as: NEXIUM Take 40 mg by mouth daily at 12 noon.   estrogen (conjugated)-medroxyprogesterone 0.3-1.5 MG tablet Commonly known as: PREMPRO Take 1 tablet by mouth daily.   famotidine  20 MG tablet Commonly known as: PEPCID  Take 20 mg by mouth at bedtime.   ferrous sulfate  325 (65 FE) MG tablet Take 325 mg by mouth at bedtime.   fluticasone -salmeterol 250-50 MCG/ACT Aepb Commonly known as: ADVAIR Inhale 1 puff into the lungs 2 (two) times daily as needed (chest congestion).   furosemide  20 MG tablet Commonly known as: LASIX  Take 20 mg by mouth daily.   glipiZIDE  2.5 MG 24 hr tablet Commonly known as: GLUCOTROL  XL TAKE 1 TABLET BY MOUTH  DAILY  WITH BREAKFAST   Janumet  50-1000 MG tablet Generic drug: sitaGLIPtin -metformin  TAKE 1 TABLET BY MOUTH  TWICE DAILY What changed: when to take this   levofloxacin  500 MG tablet Commonly known as: LEVAQUIN  Take 1 tablet (500 mg total) by mouth daily for 7 days.   linezolid  600 MG tablet Commonly known as: ZYVOX  Take 1 tablet (600 mg total) by mouth every 12 (twelve) hours for 7 days.   LORazepam  2 MG tablet  Commonly known as: ATIVAN  Take 2 mg by mouth as needed.   Lyrica  100 MG capsule Generic drug: pregabalin  Take 1 capsule by mouth 2 (two) times daily.   magnesium  oxide 400 (240 Mg) MG tablet Commonly known as: MAG-OX Take 400 mg by mouth daily.   oxyCODONE  5 MG immediate release tablet Commonly known as: Roxicodone  Take 1 tablet (5 mg total) by mouth every 12 (twelve) hours as needed for severe pain (pain score 7-10).   Ozempic (1 MG/DOSE) 4 MG/3ML Sopn Generic drug: Semaglutide (1 MG/DOSE) Inject 0.75 mLs into the skin once a week.   pioglitazone  45 MG tablet Commonly known as: ACTOS  TAKE 1 TABLET BY MOUTH AT  BEDTIME   predniSONE  10 MG tablet Commonly known as: DELTASONE  Take 10 mg by mouth as needed.   propranolol  ER 60 MG 24 hr capsule Commonly known as: INDERAL  LA Take 60 mg by mouth daily.   rosuvastatin  10 MG tablet Commonly known as: CRESTOR  Take 10 mg by mouth daily.   Santyl  250 UNIT/GM ointment Generic drug: collagenase  Apply 1 Application topically daily. 1.1 cm x 0.6 cm x 0.3 cm   spironolactone  25 MG tablet Commonly known as: ALDACTONE  Take 25 mg by mouth daily.   topiramate  50 MG tablet Commonly known as: TOPAMAX  Take 50 mg by mouth 2 (two) times daily.   zinc  gluconate 50 MG tablet Take 50 mg by mouth daily.   zolpidem  5 MG tablet Commonly known as: AMBIEN  Take 5 mg by mouth at bedtime.        Follow-up Information     Sparks, Reyes BIRCH, MD Follow up.   Specialty: Internal Medicine Why: hospital follow up Contact  information: [address] [facility] [place] [address] [phone]                Discharge Exam: Fredricka Weights   07/18/24 1409 07/18/24 1414 07/19/24 2201  Weight: 108.9 kg 108 kg 108.9 kg   Physical Exam  Constitutional: In no distress.  Cardiovascular: Normal rate, regular rhythm. No lower extremity edema  Pulmonary: Non labored breathing on room air, no wheezing or rales.   Abdominal: Soft. Non distended and non tender Musculoskeletal: Normal range of motion.     Neurological: Alert and oriented to person, place, and time. Non focal  Skin: lateral R foot w small ulcer, R lateral of midline back surgical dressing, no signficant amount of drainage, or eythema appreciated.   Condition at discharge: good  The results of significant diagnostics from this hospitalization (including imaging, microbiology, ancillary and laboratory) are listed below for reference.   Imaging Studies: DG Foot 2 Views Right Result Date: 07/21/2024 CLINICAL DATA:  Right foot wound at base of fifth metatarsal. EXAM: RIGHT FOOT - 2 VIEW COMPARISON:  05/09/2020. FINDINGS: There is a displaced fracture at the base of the fifth metatarsal, indeterminate in age. No dislocation is seen. There is moderate calcaneal spurring. Degenerative changes are noted in the midfoot. The visualized soft tissues are within normal limits. IMPRESSION: Displaced fracture at the base of the proximal fifth metatarsal. Electronically Signed   By: Leita Birmingham M.D.   On: 07/21/2024 19:00   CT Lumbar Spine Wo Contrast Result Date: 07/18/2024 EXAM: CT OF THE LUMBAR SPINE WITHOUT CONTRAST 07/18/2024 05:05:25 PM TECHNIQUE: CT of the lumbar spine was performed without the administration of intravenous contrast. Multiplanar reformatted images are provided for review. Automated exposure control, iterative reconstruction, and/or weight based adjustment of the mA/kV was utilized to  reduce the radiation dose to as low as  reasonably achievable. COMPARISON: MRI lumbar spine 09/06/2023 CLINICAL HISTORY: Low back pain, infection or inflammation suspected (Ped 0-17y); Concern for potential infection at surgical site. PT had spinal stimulator placed aug 22n. Last night noticed bleeding from site. Went to [employer] today and was advised to come to ED for possible infection. Pt denies fever and denies pain. FINDINGS: BONES AND ALIGNMENT: Moderate dextrocurvature of the lumbar spine centered at L2-L3 with slight component of rotary subluxation. No acute fracture in the lumbar spine. No destructive osseous lesion. No focal osteopenia or erosive change appreciated. Schmorl's nodes in the lower thoracic spine, particularly at T11. At T12-L1, there is no significant spinal canal or foraminal stenosis. DEGENERATIVE CHANGES: At L1-2, there is mild disc space narrowing and vacuum disc phenomenon, a small disc bulge, and mild facet arthrosis. No significant spinal canal or foraminal stenosis. At L2-L3, there is mild disc space narrowing and vacuum disc phenomenon. There is a small disc bulge, mild-to-moderate facet arthrosis, and thickening of the ligamentum flavum. There is no significant spinal canal stenosis or foraminal stenosis. At L3-L4, there is moderate disc space narrowing and vacuum disc phenomenon. There is a diffuse disc bulge resulting in lateral recess narrowing, moderate facet arthrosis, and thickening of the ligamentum flavum. There is moderate spinal canal stenosis and moderate foraminal stenosis on the right. At L4-5, there is a diffuse disc bulge and moderate facet arthrosis. Thickening of the ligamentum flavum is present. There is lateral recess narrowing and mild spinal canal stenosis. There is mild foraminal stenosis on the left. At L5-S1, there is mild disc space narrowing and vacuum disc phenomenon, small disc bulge, and moderate facet arthrosis. No significant spinal canal stenosis. There is moderate foraminal stenosis on  the left. SOFT TISSUES: Spinal stimulator pack noted within the subcutaneous tissues right of midline over the lumbar spine at the level of L2. There is mild thickening of the skin overlying the stimulator site which may reflect postsurgical changes versus cellulitis. Within the limitations of streak artifact, there is no evidence of surrounding fluid collection. No locules of gas appreciated. Spinal stimulator lead courses superiorly within the paraspinal soft tissues to the lower thoracic spine. There is no significant abnormality of the paraspinal musculature. VASCULATURE: Mild atherosclerosis of the abdominal aorta and branch vessels. KIDNEYS, URETERS, AND BLADDER: There is an exophytic cyst along the lateral aspect of the right kidney, which measures at least 1.9 cm in diameter. GALLBLADDER AND BILE DUCTS: Cholelithiasis. IMPRESSION: 1. Mild thickening of the skin overlying the stimulator site, possibly postsurgical changes versus cellulitis. No surrounding fluid collection or gas appreciated, limited by streak artifact. 2. Degenerative changes as detailed above, most pronounced at L3-L4 with moderate spinal canal stenosis and moderate right foraminal stenosis. 3. Moderate dextrocurvature of the lumbar spine centered at L2-L3. Electronically signed by: Donnice Mania MD 07/18/2024 06:37 PM EDT RP Workstation: [id number]   VAS US  ABI WITH/WO TBI Result Date: 07/09/2024  LOWER EXTREMITY DOPPLER STUDY Patient Name:  SALLYANNE BIRKHEAD  Date of Exam:   07/06/2024 Medical Rec #: [id number]        Accession #:    [id number] Date of Birth: [birth year]        Patient Gender: F Patient Age:   89 years Exam Location:  [facility] Procedure:      VAS US  ABI WITH/WO TBI Referring Phys: --------------------------------------------------------------------------------  Indications: Toe discoloration, 2nd toe left foot  Performing Technologist: Jerel Croak RVT  Examination Guidelines: A complete evaluation includes at  minimum, Doppler waveform signals and systolic blood pressure reading at the level of bilateral brachial, anterior tibial, and posterior tibial arteries, when vessel segments are accessible. Bilateral testing is considered an integral part of a complete examination. Photoelectric Plethysmograph (PPG) waveforms and toe systolic pressure readings are included as required and additional duplex testing as needed. Limited examinations for reoccurring indications may be performed as noted.  ABI Findings: +---------+------------------+-----+---------+--------+ Right    Rt Pressure (mmHg)IndexWaveform Comment  +---------+------------------+-----+---------+--------+ Brachial 137                                      +---------+------------------+-----+---------+--------+ PTA      152               1.09 triphasic         +---------+------------------+-----+---------+--------+ DP       153               1.09 triphasic         +---------+------------------+-----+---------+--------+ Great Toe138               0.99 Normal            +---------+------------------+-----+---------+--------+ +---------+------------------+-----+---------+-------+ Left     Lt Pressure (mmHg)IndexWaveform Comment +---------+------------------+-----+---------+-------+ Brachial 140                                     +---------+------------------+-----+---------+-------+ PTA      161               1.15 triphasic        +---------+------------------+-----+---------+-------+ DP       160               1.14 triphasic        +---------+------------------+-----+---------+-------+ Great Toe160               1.14 Normal           +---------+------------------+-----+---------+-------+ TOES Findings: +----------+---------------+--------+-------+ Right ToesPressure (mmHg)WaveformComment +----------+---------------+--------+-------+ 1st Digit                Normal           +----------+---------------+--------+-------+ 2nd Digit                Normal          +----------+---------------+--------+-------+  +---------+---------------+--------+--------------+ Left ToesPressure (mmHg)WaveformComment        +---------+---------------+--------+--------------+ 1st Digit               Normal                 +---------+---------------+--------+--------------+ 2nd Digit               Normal  discolored toe +---------+---------------+--------+--------------+    Summary: Right: Resting right ankle-brachial index is within normal range. The right toe-brachial index is normal.  Left: Resting left ankle-brachial index is within normal range. The left toe-brachial index is normal.  *See table(s) above for measurements and observations.  Electronically signed by Selinda Gu MD on 07/09/2024 at 1:11:17 PM.    Final    CT Head Wo Contrast Result Date: 06/26/2024 CLINICAL DATA:  Mental status change, unknown cause. EXAM: CT HEAD WITHOUT CONTRAST TECHNIQUE: Contiguous axial images were obtained from the base of the skull through the vertex without intravenous contrast. RADIATION DOSE REDUCTION:  This exam was performed according to the departmental dose-optimization program which includes automated exposure control, adjustment of the mA and/or kV according to patient size and/or use of iterative reconstruction technique. COMPARISON:  MRI brain from 08/21/2021. FINDINGS: Brain: No evidence of acute infarction, hemorrhage, hydrocephalus, extra-axial collection or mass lesion/mass effect. Ventricles are normal. Cerebral volume is age appropriate. Vascular: No hyperdense vessel or unexpected calcification. Skull: Normal. Negative for fracture or focal lesion. Sinuses/Orbits: No acute finding. Other: Visualized mastoid air cells are unremarkable. No mastoid effusion. IMPRESSION: *No acute intracranial abnormality. Electronically Signed   By: Ree Molt M.D.   On: 06/26/2024 10:37   DG  Chest 1 View Result Date: 06/26/2024 CLINICAL DATA:  93061 Confusion [id number]. EXAM: CHEST  1 VIEW COMPARISON:  04/03/2012. FINDINGS: Linear area of scarring/atelectasis noted overlying the left lateral costophrenic angle. Bilateral lung fields are otherwise clear. Bilateral costophrenic angles are clear. Normal cardio-mediastinal silhouette. No acute osseous abnormalities. The soft tissues are within normal limits. Neurostimulator device noted overlying the right upper abdomen with its lead overlying the midthoracic spine region. IMPRESSION: No active disease. Electronically Signed   By: Ree Molt M.D.   On: 06/26/2024 10:35    Microbiology: Results for orders placed or performed during the hospital encounter of 07/18/24  Blood culture (single)     Status: None   Collection Time: 07/18/24  2:18 PM   Specimen: BLOOD  Result Value Ref Range Status   Specimen Description BLOOD BLOOD RIGHT ARM  Final   Special Requests   Final    BOTTLES DRAWN AEROBIC AND ANAEROBIC Blood Culture adequate volume   Culture   Final    NO GROWTH 5 DAYS Performed at [facility], [address] [place], [place] [address]    Report Status 07/23/2024 FINAL  Final  Aerobic Culture w Gram Stain (superficial specimen)     Status: None   Collection Time: 07/19/24  6:06 PM   Specimen: Back; Wound  Result Value Ref Range Status   Specimen Description   Final    BACK Performed at [facility], [address], [place], [place] [address]    Special Requests   Final    NONE Performed at [facility], [address] [place], [place] [address]    Gram Stain   Final    ABUNDANT WBC PRESENT, PREDOMINANTLY PMN NO ORGANISMS SEEN Performed at [facility], [address] [place], [place] [address]    Culture FEW METHICILLIN RESISTANT STAPHYLOCOCCUS AUREUS  Final   Report Status 07/23/2024 FINAL  Final   Organism ID, Bacteria METHICILLIN RESISTANT STAPHYLOCOCCUS AUREUS  Final       Susceptibility   Methicillin resistant staphylococcus aureus - MIC*    CIPROFLOXACIN >=8 RESISTANT Resistant     ERYTHROMYCIN >=8 RESISTANT Resistant     GENTAMICIN <=0.5 SENSITIVE Sensitive     OXACILLIN >=4 RESISTANT Resistant     TETRACYCLINE <=1 SENSITIVE Sensitive     VANCOMYCIN  1 SENSITIVE Sensitive     TRIMETH /SULFA  >=320 RESISTANT Resistant     CLINDAMYCIN <=0.25 SENSITIVE Sensitive     RIFAMPIN <=0.5 SENSITIVE Sensitive     Inducible Clindamycin NEGATIVE Sensitive     LINEZOLID  2 SENSITIVE Sensitive     * FEW METHICILLIN RESISTANT STAPHYLOCOCCUS AUREUS  Aerobic Culture w Gram Stain (superficial specimen)     Status: None   Collection Time: 07/19/24  6:07 PM   Specimen: Wound  Result Value Ref Range Status   Specimen Description  Final    WOUND Performed at [facility], [address] [place], [place] [address]    Special Requests   Final    RIGHT FOOT Performed at [facility], [address] [place], [place] [address]    Gram Stain   Final    RARE WBC PRESENT, PREDOMINANTLY PMN FEW GRAM POSITIVE COCCI FEW GRAM NEGATIVE RODS Performed at [facility], [address] [place], [place] [address]    Culture   Final    ABUNDANT PSEUDOMONAS AERUGINOSA ABUNDANT METHICILLIN RESISTANT STAPHYLOCOCCUS AUREUS    Report Status 07/22/2024 FINAL  Final   Organism ID, Bacteria PSEUDOMONAS AERUGINOSA  Final   Organism ID, Bacteria METHICILLIN RESISTANT STAPHYLOCOCCUS AUREUS  Final      Susceptibility   Methicillin resistant staphylococcus aureus - MIC*    CIPROFLOXACIN >=8 RESISTANT Resistant     ERYTHROMYCIN >=8 RESISTANT Resistant     GENTAMICIN <=0.5 SENSITIVE Sensitive     OXACILLIN >=4 RESISTANT Resistant     TETRACYCLINE <=1 SENSITIVE Sensitive     VANCOMYCIN  1 SENSITIVE Sensitive     TRIMETH /SULFA  >=320 RESISTANT Resistant     CLINDAMYCIN <=0.25 SENSITIVE Sensitive     RIFAMPIN <=0.5 SENSITIVE Sensitive     Inducible Clindamycin NEGATIVE  Sensitive     LINEZOLID  2 SENSITIVE Sensitive     * ABUNDANT METHICILLIN RESISTANT STAPHYLOCOCCUS AUREUS   Pseudomonas aeruginosa - MIC*    MEROPENEM <=0.25 SENSITIVE Sensitive     CIPROFLOXACIN 0.12 SENSITIVE Sensitive     IMIPENEM 2 SENSITIVE Sensitive     PIP/TAZO Value in next row Sensitive      8 SENSITIVEThis is a modified FDA-approved test that has been validated and its performance characteristics determined by the reporting laboratory.  This laboratory is certified under the Clinical Laboratory Improvement Amendments CLIA as qualified to perform high complexity clinical laboratory testing.    CEFEPIME  Value in next row Sensitive      8 SENSITIVEThis is a modified FDA-approved test that has been validated and its performance characteristics determined by the reporting laboratory.  This laboratory is certified under the Clinical Laboratory Improvement Amendments CLIA as qualified to perform high complexity clinical laboratory testing.    CEFTAZIDIME/AVIBACTAM Value in next row Sensitive      8 SENSITIVEThis is a modified FDA-approved test that has been validated and its performance characteristics determined by the reporting laboratory.  This laboratory is certified under the Clinical Laboratory Improvement Amendments CLIA as qualified to perform high complexity clinical laboratory testing.    CEFTOLOZANE/TAZOBACTAM Value in next row Sensitive      8 SENSITIVEThis is a modified FDA-approved test that has been validated and its performance characteristics determined by the reporting laboratory.  This laboratory is certified under the Clinical Laboratory Improvement Amendments CLIA as qualified to perform high complexity clinical laboratory testing.    TOBRAMYCIN Value in next row Sensitive      8 SENSITIVEThis is a modified FDA-approved test that has been validated and its performance characteristics determined by the reporting laboratory.  This laboratory is certified under the Clinical  Laboratory Improvement Amendments CLIA as qualified to perform high complexity clinical laboratory testing.    CEFTAZIDIME Value in next row Sensitive      8 SENSITIVEThis is a modified FDA-approved test that has been validated and its performance characteristics determined by the reporting laboratory.  This laboratory is certified under the Clinical Laboratory Improvement Amendments CLIA as qualified to perform high complexity clinical laboratory testing.    *  ABUNDANT PSEUDOMONAS AERUGINOSA  Aerobic/Anaerobic Culture w Gram Stain (surgical/deep wound)     Status: None (Preliminary result)   Collection Time: 07/22/24  1:00 PM   Specimen: Soft Tissue, Other  Result Value Ref Range Status   Specimen Description   Final    TISSUE Performed at [facility] [facility], [address], [place], [place] [address]    Special Requests   Final    SOFT TISS Performed at [facility], [address] [place], [place] [address]    Gram Stain   Final    NO WBC SEEN NO ORGANISMS SEEN Performed at [facility], [address], [place], [place] [address]    Culture   Final    RARE STAPHYLOCOCCUS AUREUS SUSCEPTIBILITIES TO FOLLOW NO ANAEROBES ISOLATED; CULTURE IN PROGRESS FOR 5 DAYS    Report Status PENDING  Incomplete  Aerobic/Anaerobic Culture w Gram Stain (surgical/deep wound)     Status: None (Preliminary result)   Collection Time: 07/22/24  1:00 PM   Specimen: Abscess  Result Value Ref Range Status   Specimen Description   Final    ABSCESS Performed at [facility], [address], [place], [place] [address]    Special Requests   Final    NONE Performed at [facility], [address], [place], [place] [address]    Gram Stain   Final    NO WBC SEEN NO ORGANISMS SEEN Performed at [facility], [address], [place], [place] [address]    Culture   Final    RARE STAPHYLOCOCCUS AUREUS NO ANAEROBES ISOLATED; CULTURE IN PROGRESS FOR 5 DAYS     Report Status PENDING  Incomplete    Labs: CBC: Recent Labs  Lab 07/19/24 0536 07/20/24 0449 07/21/24 0311 07/22/24 0420 07/23/24 0240  WBC 5.9 5.3 5.8 5.3 7.2  NEUTROABS  --   --   --   --  6.1  HGB 10.7* 10.2* 10.1* 10.4* 11.0*  HCT 34.0* 31.5* 31.4* 32.2* 32.9*  MCV 97.4 95.5 94.9 95.8 93.5  PLT 227 200 214 206 229   Basic Metabolic Panel: Recent Labs  Lab 07/19/24 0536 07/20/24 0449 07/21/24 0311 07/22/24 0420 07/23/24 0240  NA 140 140 142 141 140  K 3.7 3.5 3.6 4.4 4.4  CL 111 111 109 109 106  CO2 21* 21* 23 24 19*  GLUCOSE 109* 119* 126* 160* 200*  BUN 28* 28* 24* 26* 28*  CREATININE 1.73* 1.34* 1.39* 1.56* 1.46*  CALCIUM  8.9 9.0 9.1 9.1 9.4   Liver Function Tests: No results for input(s): AST, ALT, ALKPHOS, BILITOT, PROT, ALBUMIN in the last 168 hours. CBG: Recent Labs  Lab 07/22/24 1327 07/22/24 1650 07/22/24 2138 07/23/24 0827 07/23/24 1204  GLUCAP 125* 195* 258* 117* 214*    Discharge time spent: greater than 30 minutes.  Signed: Alban Pepper, MD [facility] 07/26/2024

## 2024-07-26 NOTE — Telephone Encounter (Signed)
 Patient called reporting that she has been taking her Levaquin  twice a day instead of once of day. Patient reports she has ran out of the Levaquin . Yesterday she states she only took one tablet as prescribed. Per Dr. Fayette she will send in an additional 3 days of Levaquin  for the patient. Patient informed and verbalized understanding. Melody Brooks, CMA

## 2024-07-27 LAB — AEROBIC/ANAEROBIC CULTURE W GRAM STAIN (SURGICAL/DEEP WOUND)
Culture: NO GROWTH
Culture: NO GROWTH
Gram Stain: NONE SEEN
Gram Stain: NONE SEEN

## 2024-07-31 ENCOUNTER — Encounter: Payer: Self-pay | Admitting: Infectious Diseases

## 2024-07-31 ENCOUNTER — Ambulatory Visit: Attending: Infectious Diseases | Admitting: Infectious Diseases

## 2024-07-31 VITALS — BP 119/70 | HR 71 | Temp 97.3°F | Ht 68.0 in | Wt 245.0 lb

## 2024-07-31 DIAGNOSIS — A4902 Methicillin resistant Staphylococcus aureus infection, unspecified site: Secondary | ICD-10-CM

## 2024-07-31 DIAGNOSIS — L299 Pruritus, unspecified: Secondary | ICD-10-CM | POA: Insufficient documentation

## 2024-07-31 DIAGNOSIS — L03312 Cellulitis of back [any part except buttock]: Secondary | ICD-10-CM

## 2024-07-31 DIAGNOSIS — L0291 Cutaneous abscess, unspecified: Secondary | ICD-10-CM | POA: Diagnosis present

## 2024-07-31 DIAGNOSIS — G4710 Hypersomnia, unspecified: Secondary | ICD-10-CM | POA: Insufficient documentation

## 2024-07-31 DIAGNOSIS — E1142 Type 2 diabetes mellitus with diabetic polyneuropathy: Secondary | ICD-10-CM | POA: Insufficient documentation

## 2024-07-31 DIAGNOSIS — E11621 Type 2 diabetes mellitus with foot ulcer: Secondary | ICD-10-CM | POA: Diagnosis not present

## 2024-07-31 DIAGNOSIS — G061 Intraspinal abscess and granuloma: Secondary | ICD-10-CM

## 2024-07-31 DIAGNOSIS — Z7984 Long term (current) use of oral hypoglycemic drugs: Secondary | ICD-10-CM | POA: Insufficient documentation

## 2024-07-31 DIAGNOSIS — B9562 Methicillin resistant Staphylococcus aureus infection as the cause of diseases classified elsewhere: Secondary | ICD-10-CM | POA: Diagnosis not present

## 2024-07-31 DIAGNOSIS — L97509 Non-pressure chronic ulcer of other part of unspecified foot with unspecified severity: Secondary | ICD-10-CM | POA: Diagnosis not present

## 2024-07-31 DIAGNOSIS — Z7985 Long-term (current) use of injectable non-insulin antidiabetic drugs: Secondary | ICD-10-CM | POA: Diagnosis not present

## 2024-07-31 DIAGNOSIS — L0390 Cellulitis, unspecified: Secondary | ICD-10-CM | POA: Diagnosis not present

## 2024-07-31 DIAGNOSIS — Z22322 Carrier or suspected carrier of Methicillin resistant Staphylococcus aureus: Secondary | ICD-10-CM | POA: Insufficient documentation

## 2024-07-31 DIAGNOSIS — I129 Hypertensive chronic kidney disease with stage 1 through stage 4 chronic kidney disease, or unspecified chronic kidney disease: Secondary | ICD-10-CM | POA: Diagnosis not present

## 2024-07-31 DIAGNOSIS — T8141XD Infection following a procedure, superficial incisional surgical site, subsequent encounter: Secondary | ICD-10-CM | POA: Diagnosis not present

## 2024-07-31 DIAGNOSIS — N189 Chronic kidney disease, unspecified: Secondary | ICD-10-CM | POA: Insufficient documentation

## 2024-07-31 DIAGNOSIS — B965 Pseudomonas (aeruginosa) (mallei) (pseudomallei) as the cause of diseases classified elsewhere: Secondary | ICD-10-CM | POA: Insufficient documentation

## 2024-07-31 DIAGNOSIS — Z79899 Other long term (current) drug therapy: Secondary | ICD-10-CM | POA: Diagnosis not present

## 2024-07-31 DIAGNOSIS — E1122 Type 2 diabetes mellitus with diabetic chronic kidney disease: Secondary | ICD-10-CM | POA: Diagnosis not present

## 2024-07-31 DIAGNOSIS — T8141XA Infection following a procedure, superficial incisional surgical site, initial encounter: Secondary | ICD-10-CM | POA: Diagnosis not present

## 2024-07-31 DIAGNOSIS — R7989 Other specified abnormal findings of blood chemistry: Secondary | ICD-10-CM | POA: Insufficient documentation

## 2024-07-31 DIAGNOSIS — Z8614 Personal history of Methicillin resistant Staphylococcus aureus infection: Secondary | ICD-10-CM | POA: Insufficient documentation

## 2024-07-31 MED ORDER — MUPIROCIN 2 % EX OINT: 1.0000 | TOPICAL_OINTMENT | Freq: Two times a day (BID) | CUTANEOUS | 0 refills | Status: AC

## 2024-07-31 MED ORDER — CHLORHEXIDINE GLUCONATE 2 % EX SOLN: 15.0000 mL | Freq: Every day | CUTANEOUS | 1 refills | Status: DC

## 2024-07-31 NOTE — Patient Instructions (Addendum)
 During your follow-up visit, we discussed the status of your back wound, foot ulcer, kidney function, excessive sleepiness, chronic pain, diabetes, and hypertension. Your back wound is healing well, but your foot ulcer requires ongoing care. We also addressed your recent kidney function decline and the impact of your medications on your sleepiness.  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 Continue using Santyl  for wound care and follow up with your foot doctor for further management.  -MRSA INFECTION OF BACK, RESOLVED: The MRSA infection in your back has resolved, and the wound is healing well. Avoid scratching the area and monitor for any signs of infection.   -CHRONIC KIDNEY DISEASE WITH RECENT ACUTE WORSENING: Your chronic kidney disease has worsened recently, likely due to your medications. This has affected your kidney function and may be contributing to your sleepiness. Increase your hydration, monitor your kidney function, and avoid unnecessary medications to reduce the strain on your kidneys.  -EXCESSIVE DAYTIME SLEEPINESS: Your excessive daytime sleepiness is likely due to your medications and kidney function. Discuss possible adjustments to your medications with your primary care provider to reduce the doses of those that cause drowsiness.  -CHRONIC PAIN, BACK AND FOOT: Your chronic pain in your back and foot is being managed with medications. Try to minimize the use of sedating medications due to your kidney issues.  -TYPE 2 DIABETES MELLITUS: Your type 2 diabetes is being managed with Farxiga. Continue taking your medication as prescribed.  -HYPERTENSION: Your hypertension is being managed with amlodipine . Continue taking your medication as prescribed.  You will start the decolonization protocol for MRSA with mupirocin  ointment Twice a day to your nares And  wash with chlorhexidine  2%  Once a day for 10 days after you get clearance from the neurosurgeon that you can have a shower and get the surgical site wet  INSTRUCTIONS:  Please follow up with your foot doctor for the management of your foot ulcer. Discuss medication adjustments with your primary care provider to address your excessive sleepiness and kidney function. Continue monitoring your kidney function and stay hydrated.

## 2024-07-31 NOTE — Progress Notes (Signed)
 NAME: Melody Brooks  DOB: February 18, 1956  MRN: [id number]  Date/Time: 07/31/2024 12:15 PM  Subjective:  Pt is here with her husband She consented to the use of AI scribe  ? Vickye Astorino is a 68 year old female who presents for follow-up after hospital discharge. 07/18/24-07/23/24  history of CKD, diabetes mellitus, peripheral neuropathy hypertension, chronic rt foot wound, lumbosacral radiculitis pain for which she underwent Permanent spinal cord stimulator placement on 06/22/2024 at the level of T7-T8 disc space with removal of the spinous process at T9.  Right  flank pocket created with the pulse generator on the right side.  She was hospitlaized for purulent drainage at the surgicla site of the flank pocket and undewent I/D for MRSA infeciton by neurosurgeon on  it was a superfical c=infeciton and did not involve the pocket she was discharged hom eon PO linezolid  and Po levauin H er back wound, previously infected with MRSA, is not completely healed. She changed the dressing on Saturday and noted some drainage. No fever or chills. She experiences itching at the site but refrains from scratching.  She feels tired, and her husband states she sleeps all the time . Her hemoglobin was 11, consistent with her levels at hospital discharge. Her white blood cell count is 9.4, and her platelet count is 230. Her kidney function has declined, with creatinine levels rising from 1.46 to 2.2.  She experiences excessive sleepiness, falling asleep frequently during the day. She sleeps well at night but can sleep up to 23 hours a day if not woken up. No sleep apnea and reports good thyroid  function. Her medications include Topamax , which was recently started, and Ambien , which she takes nightly. She also takes Flexeril  at bedtime, which causes significant drowsiness.  Her medication regimen includes propanediol, Topamax , Farxiga for diabetes, amlodipine  for blood pressure, spironolactone , furosemide ,  Crestor , and occasionally oxycodone  for back pain. She has not taken phentermine  or Ativan  recently. She completed her course of Levaquin  and linezolid  antibiotics.  She has a history of MRSA and pseudomonas infections in her foot, which is still healing. She uses Santyl  for wound care and reports some drainage, likely due to walking. Her foot is tender but improving.  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 pt stated on around October of 2022   Angioedema    Arterial fibromuscular dysplasia 07/08/2015   Arthritis    Cancer (HCC)    Carotid stenosis 12/16/2017   Chronic kidney disease    Chronic pain syndrome 03/20/2024   Chronic urticaria 09/10/2019   Constipation    Diabetes mellitus with polyneuropathy (HCC) 07/17/2003   Fatigue    Fibromuscular dysplasia    Generalized headaches    GERD (gastroesophageal reflux disease)    Hemorrhoids    Hiatal hernia    Hypertension    Idiopathic scoliosis 07/30/2002   Lumbar facet arthropathy 03/20/2024   Lumbosacral radiculitis 12/11/2014   Neuropathy    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   a.) presurgical PCR (+) 06/13/2024 prior to THORACIC LAMINECTOMY FOR SCS IMPLANTATION   Palpitations    Pneumonia    Rectal bleeding    Rectal pain    Spinal stenosis 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 03/20/2024    Past Surgical History:  Procedure Laterality Date   Basal cell cancer  removal  colonoscopy  2010   EXCISION/RELEASE BURSA HIP Right 12/23/2022   Procedure: OPEN EXCISION OF RIGHT TROCHANTERIC BURSA;  Surgeon: Edie Norleen PARAS, MD;  Location: [facility] ORS;  Service: Orthopedics;  Laterality: Right;   EXCISIONAL HEMORRHOIDECTOMY  03/2012   IR ANGIO EXTERNAL CAROTID SEL EXT CAROTID BILAT MOD SED  09/04/2021   IR ANGIO INTRA EXTRACRAN SEL INTERNAL CAROTID BILAT MOD SED   09/04/2021   IR ANGIO VERTEBRAL SEL SUBCLAVIAN INNOMINATE UNI L MOD SED  09/04/2021   IR ANGIO VERTEBRAL SEL VERTEBRAL UNI R MOD SED  09/04/2021   IR US  GUIDE VASC ACCESS RIGHT  09/04/2021   LUMBAR WOUND DEBRIDEMENT N/A 07/22/2024   Procedure: Wound Washout, IPG site;  Surgeon: Claudene Penne ORN, MD;  Location: [facility] ORS;  Service: Neurosurgery;  Laterality: N/A;   right and left eye cataract surgery  2006, 2008   right in 2006 and left 2008   THORACIC LAMINECTOMY FOR SPINAL CORD STIMULATOR N/A 06/22/2024   Procedure: THORACIC LAMINECTOMY FOR SPINAL CORD STIMULATOR;  Surgeon: Clois Fret, MD;  Location: [facility] ORS;  Service: Neurosurgery;  Laterality: N/A;  THORACIC LAMINECTOMY FOR SPINAL CORD STIMULATOR PLACEMENT   TUBAL LIGATION  1995    Social History   Socioeconomic History   Marital status: Married    Spouse name: Christopher   Number of children: Not on file   Years of education: Not on file   Highest education level: Not on file  Occupational History   Not on file  Tobacco Use   Smoking status: Never   Smokeless tobacco: Never   Tobacco comments:    tobacco use- no  Vaping Use   Vaping status: Never Used  Substance and Sexual Activity   Alcohol  use: No   Drug use: No   Sexual activity: Not on file  Other Topics Concern   Not on file  Social History Narrative   Full time. Married. Regularly exercises- walks.    Social Drivers of Corporate investment banker Strain: Low Risk  (04/17/2024)   Received from [facility]   Overall Financial Resource Strain (CARDIA)    Difficulty of Paying Living Expenses: Not hard at all  Food Insecurity: No Food Insecurity (07/19/2024)   Hunger Vital Sign    Worried About Running Out of Food in the Last Year: Never true    Ran Out of Food in the Last Year: Never true  Transportation Needs: No Transportation Needs (07/19/2024)   PRAPARE - Administrator, Civil Service (Medical): No    Lack of Transportation  (Non-Medical): No  Physical Activity: Not on file  Stress: Not on file  Social Connections: Unknown (07/19/2024)   Social Connection and Isolation Panel    Frequency of Communication with Friends and Family: More than three times a week    Frequency of Social Gatherings with Friends and Family: More than three times a week    Attends Religious Services: Patient declined    Database administrator or Organizations: Patient declined    Attends Banker Meetings: Patient declined    Marital Status: Married  Catering manager Violence: Not At Risk (07/19/2024)   Humiliation, Afraid, Rape, and Kick questionnaire    Fear of Current or Ex-Partner: No    Emotionally Abused: No    Physically Abused: No    Sexually Abused: No    Family History  Problem Relation Age of Onset   Heart failure Mother    COPD Mother  Hypertension Mother    Arthritis Mother    Cancer Father        prostate   Parkinson's disease Father    Colon cancer Father    Cancer Maternal Aunt        colon   Cancer Paternal Grandmother        stomach   Diabetes Brother    Diabetes Brother    Allergies  Allergen Reactions   Lisinopril Hives, Itching, Swelling and Rash    Angioedema   I? Current Outpatient Medications  Medication Sig Dispense Refill   acetaminophen  (TYLENOL ) 500 MG tablet Take 1,000 mg by mouth every 6 (six) hours as needed for moderate pain.     amLODipine  (NORVASC ) 5 MG tablet Take 5 mg by mouth daily.     aspirin  81 MG tablet Take 81 mg by mouth daily.     Cholecalciferol  (DIALYVITE VITAMIN D  5000) 125 MCG (5000 UT) capsule Take 5,000 Units by mouth daily.     collagenase  (SANTYL ) 250 UNIT/GM ointment Apply 1 Application topically daily. 1.1 cm x 0.6 cm x 0.3 cm 30 g 0   Cyanocobalamin  (B-12 IJ) Inject 1,000 Units as directed every 14 (fourteen) days.     cyclobenzaprine  (FLEXERIL ) 10 MG tablet Take 10 mg by mouth at bedtime.     dapagliflozin propanediol (FARXIGA) 5 MG TABS tablet  Take 5 mg by mouth.     diphenhydrAMINE  (BENADRYL ) 25 mg capsule Take 25 mg by mouth at bedtime.     EPIPEN  2-PAK 0.3 MG/0.3ML SOAJ injection Inject 0.3 mg into the muscle as needed for anaphylaxis.     esomeprazole (NEXIUM) 20 MG capsule Take 40 mg by mouth daily at 12 noon.     estrogen, conjugated,-medroxyprogesterone (PREMPRO) 0.3-1.5 MG tablet Take 1 tablet by mouth daily.     famotidine  (PEPCID ) 20 MG tablet Take 20 mg by mouth at bedtime.     ferrous sulfate  325 (65 FE) MG tablet Take 325 mg by mouth at bedtime.     fluticasone -salmeterol (ADVAIR) 250-50 MCG/ACT AEPB Inhale 1 puff into the lungs 2 (two) times daily as needed (chest congestion).     furosemide  (LASIX ) 20 MG tablet Take 20 mg by mouth daily.     glipiZIDE  (GLUCOTROL  XL) 2.5 MG 24 hr tablet TAKE 1 TABLET BY MOUTH  DAILY WITH BREAKFAST 90 tablet 2   JANUMET  50-1000 MG tablet TAKE 1 TABLET BY MOUTH  TWICE DAILY (Patient taking differently: Take 1 tablet by mouth daily.) 180 tablet 2   LORazepam  (ATIVAN ) 2 MG tablet Take 2 mg by mouth as needed.     magnesium  oxide (MAG-OX) 400 (240 Mg) MG tablet Take 400 mg by mouth daily.     oxyCODONE  (ROXICODONE ) 5 MG immediate release tablet Take 1 tablet (5 mg total) by mouth every 12 (twelve) hours as needed for severe pain (pain score 7-10). 10 tablet 0   OZEMPIC, 1 MG/DOSE, 4 MG/3ML SOPN Inject 0.75 mLs into the skin once a week.     [Paused] phentermine  (ADIPEX-P ) 37.5 MG tablet Take 37.5 mg by mouth every morning.     pioglitazone  (ACTOS ) 45 MG tablet TAKE 1 TABLET BY MOUTH AT  BEDTIME 60 tablet 5   predniSONE  (DELTASONE ) 10 MG tablet Take 10 mg by mouth as needed.     pregabalin  (LYRICA ) 100 MG capsule Take 1 capsule by mouth 2 (two) times daily.     propranolol  ER (INDERAL  LA) 60 MG 24 hr capsule Take 60 mg by  mouth daily.     rosuvastatin  (CRESTOR ) 10 MG tablet Take 10 mg by mouth daily.     spironolactone  (ALDACTONE ) 25 MG tablet Take 25 mg by mouth daily.     topiramate   (TOPAMAX ) 50 MG tablet Take 50 mg by mouth 2 (two) times daily.     zinc  gluconate 50 MG tablet Take 50 mg by mouth daily.     zolpidem  (AMBIEN ) 5 MG tablet Take 5 mg by mouth at bedtime.     No current facility-administered medications for this visit.     Abtx:  Anti-infectives (From admission, onward)    None       REVIEW OF SYSTEMS:  Const: negative fever, negative chills, negative weight loss Eyes: negative diplopia or visual changes, negative eye pain ENT: negative coryza, negative sore throat Resp: negative cough, hemoptysis, dyspnea Cards: negative for chest pain, palpitations, lower extremity edema GU: negative for frequency, dysuria and hematuria GI: Negative for abdominal pain, diarrhea, bleeding, constipation Skin: negative for rash and pruritus Heme: negative for easy bruising and gum/nose bleeding MS: weakness Neurolo:excess sleepiness Psych: anxiety, Endocrine:  diabetes Allergy/Immunology-lisinopril Objective:  VITALS:  BP 119/70   Pulse 71   Temp (!) 97.3 F (36.3 C) (Temporal)   Ht 5' 8 (1.727 m)   Wt 245 lb (111.1 kg)   LMP 03/26/2012 (Approximate)   SpO2 97%   BMI 37.25 kg/m   PHYSICAL EXAM:  General: Alert, cooperative, no distress, appears stated age. tired Head: Normocephalic, without obvious abnormality, atraumatic. Eyes: Conjunctivae clear, anicteric sclerae. Pupils are equal ENT Nares normal. No drainage or sinus tenderness. Lips, mucosa, and tongue normal. No Thrush Neck: Supple, symmetrical, no adenopathy, thyroid : non tender no carotid bruit and no JVD. Back: rt lumbar area Surgical wound has healed- just old blood  Lungs: Clear to auscultation bilaterally. No Wheezing or Rhonchi. No rales. Heart: Regular rate and rhythm, no murmur, rub or gallop. Abdomen: Soft, non-tender,not distended. Bowel sounds normal. No masses Extremities: rt lateral margin superficial ulcer covered with slough  Skin: No rashes or lesions. Or  bruising Lymph: Cervical, supraclavicular normal. Neurologic: Grossly non-focal Pertinent Labs Lab Results 07/27/24 WBC 9.4 Hb 11 PLT 230  CMP Glu 160 Cr 2,2 ( was 1.46 on 07/23/24)    Microbiology: Recent Results (from the past 240 hours)  Aerobic/Anaerobic Culture w Gram Stain (surgical/deep wound)     Status: None   Collection Time: 07/22/24  1:00 PM   Specimen: Soft Tissue, Other  Result Value Ref Range Status   Specimen Description   Final    TISSUE Performed at [facility], [address], [place], [place] [address]    Special Requests   Final    SOFT TISS Performed at [facility], [address] [place], [place] [address]    Gram Stain NO WBC SEEN NO ORGANISMS SEEN   Final   Culture   Final    RARE METHICILLIN RESISTANT STAPHYLOCOCCUS AUREUS NO ANAEROBES ISOLATED Performed at [facility], [address], [place], [place] [address]    Report Status 07/27/2024 FINAL  Final   Organism ID, Bacteria METHICILLIN RESISTANT STAPHYLOCOCCUS AUREUS  Final      Susceptibility   Methicillin resistant staphylococcus aureus - MIC*    CIPROFLOXACIN >=8 RESISTANT Resistant     ERYTHROMYCIN >=8 RESISTANT Resistant     GENTAMICIN <=0.5 SENSITIVE Sensitive     OXACILLIN >=4 RESISTANT Resistant     TETRACYCLINE <=1 SENSITIVE Sensitive  VANCOMYCIN  1 SENSITIVE Sensitive     TRIMETH /SULFA  >=320 RESISTANT Resistant     CLINDAMYCIN <=0.25 SENSITIVE Sensitive     RIFAMPIN <=0.5 SENSITIVE Sensitive     Inducible Clindamycin NEGATIVE Sensitive     LINEZOLID  2 SENSITIVE Sensitive     * RARE METHICILLIN RESISTANT STAPHYLOCOCCUS AUREUS  Aerobic/Anaerobic Culture w Gram Stain (surgical/deep wound)     Status: None   Collection Time: 07/22/24  1:00 PM   Specimen: Abscess  Result Value Ref Range Status   Specimen Description   Final    ABSCESS Performed at [facility], [address] [place], [place] [address]    Special Requests   Final     NONE Performed at [facility], [address] [place], [place] [address]    Gram Stain NO WBC SEEN NO ORGANISMS SEEN   Final   Culture   Final    RARE STAPHYLOCOCCUS AUREUS SUSCEPTIBILITIES PERFORMED ON PREVIOUS CULTURE WITHIN THE LAST 5 DAYS. NO ANAEROBES ISOLATED Performed at [facility], [address] [place], [place] [address]    Report Status 07/27/2024 FINAL  Final    ? Impression/Recommendation  Permanent spinal cord stimulator placement on 06/22/2024 at  the level of T7-T8 disc space with removal of the spinous process at T9.   flank pocket created with the pulse generator on the right side lumbar area Surgical site infection Abscess with surrounding cellulitis  S/p I/D it was a superficial abscess and did not invlove the pocket MRSA infection of surgical site on the back - at the site of the pulse generator has resolved Completed 2 weeks of antibiotic in total Of which 1 week was linezolid    wound is healing well with no signs of active infection. Avoid scratching the back and monitor for signs of infection.  MRSA colonization- will do decolonization protocol with mupirocin  and chlorhexidine  wash?   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 She is using Santyl  for debridement , has completed linezolid  and levaquin  . No further  antibiotics needed and also due to renal concerns. Continue Santyl  application and follow up with the foot doctor for further management.   Chronic kidney disease with recent acute worsening   Chronic kidney disease has acutely worsened,  Creatinine increased from 1.46 to 2.2, impairing renal function and affecting medication clearance, contributing to excessive daytime sleepiness.from ambien , cyclobenzaprine , topomax, pregabalin , oxycodone    Advise increased hydration, discuss with PCP to decrease or discontinue  medications  monitor kidney function, and avoid unnecessary medications to reduce renal load.  Excessive daytime sleepiness   Excessive daytime sleepiness is likely due to polypharmacy and renal impairment. Medications such as Topamax , Flexeril , Ambien , and oxycodone  may contribute to sleepiness, especially with impaired renal clearance. Discuss medication adjustments with the primary care provider and consider reducing doses of sedating medications.   Type 2 diabetes mellitus   Type 2 diabetes mellitus is currently managed with Farxiga.  Hypertension   Hypertension is currently managed with amlodipine .  ________________________________________________ Discussed with patient, and her husband Follow PRN  errors.

## 2024-08-02 ENCOUNTER — Ambulatory Visit: Admitting: Neurosurgery

## 2024-08-02 ENCOUNTER — Encounter: Payer: Self-pay | Admitting: Neurosurgery

## 2024-08-02 VITALS — BP 124/70 | Ht 68.0 in | Wt 245.0 lb

## 2024-08-02 DIAGNOSIS — T8130XA Disruption of wound, unspecified, initial encounter: Secondary | ICD-10-CM

## 2024-08-02 DIAGNOSIS — G894 Chronic pain syndrome: Secondary | ICD-10-CM

## 2024-08-02 DIAGNOSIS — T8140XD Infection following a procedure, unspecified, subsequent encounter: Secondary | ICD-10-CM

## 2024-08-02 DIAGNOSIS — Z09 Encounter for follow-up examination after completed treatment for conditions other than malignant neoplasm: Secondary | ICD-10-CM

## 2024-08-02 NOTE — Progress Notes (Signed)
   REFERRING PHYSICIAN:  Auston Reyes BIRCH, Md [address] [facility] [place],  [place] [address]  DOS: 06/22/24  [employer] SCS placement 07/22/2024 - washout of pocket (superficial)  HISTORY OF PRESENT ILLNESS: Melody Brooks is status post above surgery.    She unfortunately had a bite from her dog and suffered a superficial pocket infection.  She has been on antibiotics.    PHYSICAL EXAMINATION:  General: Patient is well developed, well nourished, calm, collected, and in no apparent distress.   NEUROLOGICAL:  General: In no acute distress.   Awake, alert, oriented to person, place, and time.  Pupils equal round and reactive to light.  Facial tone is symmetric.     Strength:           Side Iliopsoas Quads Hamstring PF DF EHL  R 5 5 5 5 5 5   L 5 5 5 5 5 5    Incisions c/d/i   ROS (Neurologic):  Negative except as noted above  IMAGING: Nothing new to review.   ASSESSMENT/PLAN:  Melody Brooks is doing well s/p above surgery and washout.  Her wound looks excellent.  Hopefully her infection is completely cleared.  She is seeing the wound clinic in a couple of weeks for her foot wound.  Will see her back in clinic in a few weeks.    Reeves Daisy MD Department of neurosurgery

## 2024-08-09 ENCOUNTER — Ambulatory Visit: Admitting: Podiatry

## 2024-08-16 ENCOUNTER — Observation Stay

## 2024-08-16 ENCOUNTER — Emergency Department

## 2024-08-16 ENCOUNTER — Inpatient Hospital Stay

## 2024-08-16 ENCOUNTER — Other Ambulatory Visit: Payer: Self-pay

## 2024-08-16 ENCOUNTER — Encounter: Payer: Self-pay | Admitting: Intensive Care

## 2024-08-16 ENCOUNTER — Encounter: Attending: Physician Assistant | Admitting: Physician Assistant

## 2024-08-16 ENCOUNTER — Inpatient Hospital Stay
Admission: EM | Admit: 2024-08-16 | Discharge: 2024-08-23 | DRG: 617 | Disposition: A | Discharge status: Skilled Nursing Facility | Attending: Internal Medicine | Admitting: Internal Medicine

## 2024-08-16 DIAGNOSIS — Z7951 Long term (current) use of inhaled steroids: Secondary | ICD-10-CM

## 2024-08-16 DIAGNOSIS — I5032 Chronic diastolic (congestive) heart failure: Secondary | ICD-10-CM | POA: Diagnosis present

## 2024-08-16 DIAGNOSIS — E1142 Type 2 diabetes mellitus with diabetic polyneuropathy: Secondary | ICD-10-CM | POA: Diagnosis present

## 2024-08-16 DIAGNOSIS — Z8249 Family history of ischemic heart disease and other diseases of the circulatory system: Secondary | ICD-10-CM

## 2024-08-16 DIAGNOSIS — E11649 Type 2 diabetes mellitus with hypoglycemia without coma: Secondary | ICD-10-CM | POA: Diagnosis not present

## 2024-08-16 DIAGNOSIS — I10 Essential (primary) hypertension: Secondary | ICD-10-CM | POA: Diagnosis present

## 2024-08-16 DIAGNOSIS — Z7984 Long term (current) use of oral hypoglycemic drugs: Secondary | ICD-10-CM

## 2024-08-16 DIAGNOSIS — L97514 Non-pressure chronic ulcer of other part of right foot with necrosis of bone: Secondary | ICD-10-CM | POA: Diagnosis present

## 2024-08-16 DIAGNOSIS — E872 Acidosis, unspecified: Secondary | ICD-10-CM | POA: Diagnosis present

## 2024-08-16 DIAGNOSIS — B9562 Methicillin resistant Staphylococcus aureus infection as the cause of diseases classified elsewhere: Secondary | ICD-10-CM | POA: Diagnosis present

## 2024-08-16 DIAGNOSIS — I130 Hypertensive heart and chronic kidney disease with heart failure and stage 1 through stage 4 chronic kidney disease, or unspecified chronic kidney disease: Secondary | ICD-10-CM | POA: Diagnosis present

## 2024-08-16 DIAGNOSIS — R7989 Other specified abnormal findings of blood chemistry: Secondary | ICD-10-CM

## 2024-08-16 DIAGNOSIS — D509 Iron deficiency anemia, unspecified: Secondary | ICD-10-CM | POA: Diagnosis present

## 2024-08-16 DIAGNOSIS — E11628 Type 2 diabetes mellitus with other skin complications: Secondary | ICD-10-CM | POA: Diagnosis present

## 2024-08-16 DIAGNOSIS — M84474A Pathological fracture, right foot, initial encounter for fracture: Secondary | ICD-10-CM | POA: Diagnosis present

## 2024-08-16 DIAGNOSIS — Z8261 Family history of arthritis: Secondary | ICD-10-CM

## 2024-08-16 DIAGNOSIS — Z825 Family history of asthma and other chronic lower respiratory diseases: Secondary | ICD-10-CM

## 2024-08-16 DIAGNOSIS — L0390 Cellulitis, unspecified: Principal | ICD-10-CM

## 2024-08-16 DIAGNOSIS — E1122 Type 2 diabetes mellitus with diabetic chronic kidney disease: Secondary | ICD-10-CM | POA: Diagnosis present

## 2024-08-16 DIAGNOSIS — E785 Hyperlipidemia, unspecified: Secondary | ICD-10-CM | POA: Diagnosis present

## 2024-08-16 DIAGNOSIS — E669 Obesity, unspecified: Secondary | ICD-10-CM | POA: Diagnosis present

## 2024-08-16 DIAGNOSIS — K219 Gastro-esophageal reflux disease without esophagitis: Secondary | ICD-10-CM | POA: Diagnosis present

## 2024-08-16 DIAGNOSIS — Z9682 Presence of neurostimulator: Secondary | ICD-10-CM

## 2024-08-16 DIAGNOSIS — L03115 Cellulitis of right lower limb: Secondary | ICD-10-CM | POA: Diagnosis present

## 2024-08-16 DIAGNOSIS — M86171 Other acute osteomyelitis, right ankle and foot: Secondary | ICD-10-CM | POA: Diagnosis not present

## 2024-08-16 DIAGNOSIS — L089 Local infection of the skin and subcutaneous tissue, unspecified: Secondary | ICD-10-CM | POA: Diagnosis not present

## 2024-08-16 DIAGNOSIS — Z79899 Other long term (current) drug therapy: Secondary | ICD-10-CM

## 2024-08-16 DIAGNOSIS — Z888 Allergy status to other drugs, medicaments and biological substances status: Secondary | ICD-10-CM

## 2024-08-16 DIAGNOSIS — E66812 Obesity, class 2: Secondary | ICD-10-CM | POA: Diagnosis present

## 2024-08-16 DIAGNOSIS — Z820 Family history of epilepsy and other diseases of the nervous system: Secondary | ICD-10-CM

## 2024-08-16 DIAGNOSIS — Z6836 Body mass index (BMI) 36.0-36.9, adult: Secondary | ICD-10-CM

## 2024-08-16 DIAGNOSIS — N1832 Chronic kidney disease, stage 3b: Secondary | ICD-10-CM | POA: Diagnosis present

## 2024-08-16 DIAGNOSIS — Z7982 Long term (current) use of aspirin: Secondary | ICD-10-CM | POA: Diagnosis not present

## 2024-08-16 DIAGNOSIS — Z7985 Long-term (current) use of injectable non-insulin antidiabetic drugs: Secondary | ICD-10-CM

## 2024-08-16 DIAGNOSIS — M869 Osteomyelitis, unspecified: Secondary | ICD-10-CM | POA: Diagnosis present

## 2024-08-16 DIAGNOSIS — N179 Acute kidney failure, unspecified: Secondary | ICD-10-CM | POA: Diagnosis present

## 2024-08-16 DIAGNOSIS — G894 Chronic pain syndrome: Secondary | ICD-10-CM | POA: Diagnosis present

## 2024-08-16 DIAGNOSIS — M4120 Other idiopathic scoliosis, site unspecified: Secondary | ICD-10-CM | POA: Diagnosis present

## 2024-08-16 DIAGNOSIS — Z800 Family history of malignant neoplasm of digestive organs: Secondary | ICD-10-CM

## 2024-08-16 DIAGNOSIS — Z833 Family history of diabetes mellitus: Secondary | ICD-10-CM

## 2024-08-16 DIAGNOSIS — E1169 Type 2 diabetes mellitus with other specified complication: Principal | ICD-10-CM | POA: Diagnosis present

## 2024-08-16 DIAGNOSIS — Z8701 Personal history of pneumonia (recurrent): Secondary | ICD-10-CM

## 2024-08-16 DIAGNOSIS — M84674A Pathological fracture in other disease, right foot, initial encounter for fracture: Secondary | ICD-10-CM | POA: Diagnosis not present

## 2024-08-16 DIAGNOSIS — Z85828 Personal history of other malignant neoplasm of skin: Secondary | ICD-10-CM

## 2024-08-16 LAB — APTT: aPTT: 25 s (ref 24–36)

## 2024-08-16 LAB — URINALYSIS, W/ REFLEX TO CULTURE (INFECTION SUSPECTED)
Bilirubin Urine: NEGATIVE
Glucose, UA: 50 mg/dL — AB
Hgb urine dipstick: NEGATIVE
Ketones, ur: NEGATIVE mg/dL
Nitrite: NEGATIVE
Protein, ur: NEGATIVE mg/dL
Specific Gravity, Urine: 1.016 (ref 1.005–1.030)
pH: 5 (ref 5.0–8.0)

## 2024-08-16 LAB — CBC WITH DIFFERENTIAL/PLATELET
Abs Immature Granulocytes: 0.03 K/uL (ref 0.00–0.07)
Basophils Absolute: 0 K/uL (ref 0.0–0.1)
Basophils Relative: 0 %
Eosinophils Absolute: 0 K/uL (ref 0.0–0.5)
Eosinophils Relative: 0 %
HCT: 36.1 % (ref 36.0–46.0)
Hemoglobin: 11.6 g/dL — ABNORMAL LOW (ref 12.0–15.0)
Immature Granulocytes: 0 %
Lymphocytes Relative: 7 %
Lymphs Abs: 0.7 K/uL (ref 0.7–4.0)
MCH: 30.8 pg (ref 26.0–34.0)
MCHC: 32.1 g/dL (ref 30.0–36.0)
MCV: 95.8 fL (ref 80.0–100.0)
Monocytes Absolute: 0.8 K/uL (ref 0.1–1.0)
Monocytes Relative: 8 %
Neutro Abs: 8.2 K/uL — ABNORMAL HIGH (ref 1.7–7.7)
Neutrophils Relative %: 85 %
Platelets: 268 K/uL (ref 150–400)
RBC: 3.77 MIL/uL — ABNORMAL LOW (ref 3.87–5.11)
RDW: 14.7 % (ref 11.5–15.5)
WBC: 9.8 K/uL (ref 4.0–10.5)
nRBC: 0 % (ref 0.0–0.2)

## 2024-08-16 LAB — COMPREHENSIVE METABOLIC PANEL WITH GFR
ALT: 13 U/L (ref 0–44)
AST: 19 U/L (ref 15–41)
Albumin: 3.9 g/dL (ref 3.5–5.0)
Alkaline Phosphatase: 70 U/L (ref 38–126)
Anion gap: 13 (ref 5–15)
BUN: 25 mg/dL — ABNORMAL HIGH (ref 8–23)
CO2: 21 mmol/L — ABNORMAL LOW (ref 22–32)
Calcium: 9.7 mg/dL (ref 8.9–10.3)
Chloride: 100 mmol/L (ref 98–111)
Creatinine, Ser: 1.85 mg/dL — ABNORMAL HIGH (ref 0.44–1.00)
GFR, Estimated: 29 mL/min — ABNORMAL LOW (ref >60)
Glucose, Bld: 191 mg/dL — ABNORMAL HIGH (ref 70–99)
Potassium: 4.3 mmol/L (ref 3.5–5.1)
Sodium: 134 mmol/L — ABNORMAL LOW (ref 135–145)
Total Bilirubin: 0.8 mg/dL (ref 0.0–1.2)
Total Protein: 7.5 g/dL (ref 6.5–8.1)

## 2024-08-16 LAB — PROTIME-INR
INR: 1 (ref 0.8–1.2)
Prothrombin Time: 13.7 s (ref 11.4–15.2)

## 2024-08-16 LAB — BRAIN NATRIURETIC PEPTIDE: B Natriuretic Peptide: 66 pg/mL (ref 0.0–100.0)

## 2024-08-16 LAB — LACTIC ACID, PLASMA
Lactic Acid, Venous: 2.5 mmol/L (ref 0.5–1.9)
Lactic Acid, Venous: 1.6 mmol/L (ref 0.5–1.9)

## 2024-08-16 LAB — SEDIMENTATION RATE: Sed Rate: 63 mm/h — ABNORMAL HIGH (ref 0–30)

## 2024-08-16 LAB — GLUCOSE, CAPILLARY: Glucose-Capillary: 110 mg/dL — ABNORMAL HIGH (ref 70–99)

## 2024-08-16 MED ORDER — SODIUM CHLORIDE 0.9 % IV SOLN: Freq: Once | INTRAVENOUS | Status: DC

## 2024-08-16 MED ORDER — HYDRALAZINE HCL 20 MG/ML IJ SOLN: 5.0000 mg | INTRAMUSCULAR | Status: DC | PRN

## 2024-08-16 MED ORDER — PREGABALIN 50 MG PO CAPS
100.0000 mg | ORAL_CAPSULE | Freq: Two times a day (BID) | ORAL | Status: DC
  Administered 2024-08-16 – 2024-08-23 (×14): 100 mg via ORAL
  Filled 2024-08-16 (×14): qty 2

## 2024-08-16 MED ORDER — VANCOMYCIN HCL IN DEXTROSE 1-5 GM/200ML-% IV SOLN
1000.0000 mg | Freq: Once | INTRAVENOUS | Status: AC
  Administered 2024-08-16: 1000 mg via INTRAVENOUS
  Filled 2024-08-16: qty 200

## 2024-08-16 MED ORDER — SODIUM CHLORIDE 0.9 % IV SOLN
2.0000 g | INTRAVENOUS | Status: DC
  Administered 2024-08-16: 2 g via INTRAVENOUS
  Filled 2024-08-16 (×2): qty 20

## 2024-08-16 MED ORDER — VANCOMYCIN HCL 750 MG/150ML IV SOLN
750.0000 mg | INTRAVENOUS | Status: AC
Start: 2024-08-17 — End: ?

## 2024-08-16 MED ORDER — SODIUM CHLORIDE 0.9 % IV SOLN: INTRAVENOUS | Status: DC

## 2024-08-16 MED ORDER — INSULIN ASPART 100 UNIT/ML IJ SOLN
0.0000 [IU] | Freq: Three times a day (TID) | INTRAMUSCULAR | Status: DC
  Administered 2024-08-17: 1 [IU] via SUBCUTANEOUS
  Filled 2024-08-16: qty 1

## 2024-08-16 MED ORDER — ASPIRIN 81 MG PO TBEC
81.0000 mg | DELAYED_RELEASE_TABLET | Freq: Every day | ORAL | Status: DC
  Administered 2024-08-18 – 2024-08-23 (×6): 81 mg via ORAL
  Filled 2024-08-16 (×6): qty 1

## 2024-08-16 MED ORDER — PROPRANOLOL HCL ER 60 MG PO CP24
60.0000 mg | ORAL_CAPSULE | Freq: Every day | ORAL | Status: DC
  Administered 2024-08-18 – 2024-08-23 (×6): 60 mg via ORAL
  Filled 2024-08-16 (×8): qty 1

## 2024-08-16 MED ORDER — ACETAMINOPHEN 325 MG PO TABS
650.0000 mg | ORAL_TABLET | Freq: Four times a day (QID) | ORAL | Status: DC | PRN
  Administered 2024-08-17 – 2024-08-23 (×5): 650 mg via ORAL
  Filled 2024-08-16 (×5): qty 2

## 2024-08-16 MED ORDER — VANCOMYCIN HCL 1250 MG/250ML IV SOLN
1250.0000 mg | Freq: Once | INTRAVENOUS | Status: AC
  Administered 2024-08-16: 1250 mg via INTRAVENOUS
  Filled 2024-08-16 (×2): qty 250

## 2024-08-16 MED ORDER — INSULIN ASPART 100 UNIT/ML IJ SOLN: 0.0000 [IU] | Freq: Every day | INTRAMUSCULAR | Status: DC

## 2024-08-16 MED ORDER — FERROUS SULFATE 325 (65 FE) MG PO TABS
325.0000 mg | ORAL_TABLET | Freq: Every day | ORAL | Status: DC
  Administered 2024-08-17 – 2024-08-22 (×6): 325 mg via ORAL
  Filled 2024-08-16 (×6): qty 1

## 2024-08-16 MED ORDER — HEPARIN SODIUM (PORCINE) 5000 UNIT/ML IJ SOLN
5000.0000 [IU] | Freq: Three times a day (TID) | INTRAMUSCULAR | Status: DC
  Administered 2024-08-16 – 2024-08-17 (×2): 5000 [IU] via SUBCUTANEOUS
  Filled 2024-08-16 (×2): qty 1

## 2024-08-16 MED ORDER — ROSUVASTATIN CALCIUM 10 MG PO TABS
10.0000 mg | ORAL_TABLET | Freq: Every day | ORAL | Status: DC
  Administered 2024-08-17 – 2024-08-23 (×7): 10 mg via ORAL
  Filled 2024-08-16 (×7): qty 1

## 2024-08-16 MED ORDER — METRONIDAZOLE 500 MG PO TABS
500.0000 mg | ORAL_TABLET | Freq: Two times a day (BID) | ORAL | Status: AC
Start: 2024-08-16 — End: ?
  Administered 2024-08-16 – 2024-08-17 (×2): 500 mg via ORAL
  Filled 2024-08-16 (×2): qty 1

## 2024-08-16 MED ORDER — ONDANSETRON HCL 4 MG/2ML IJ SOLN: 4.0000 mg | Freq: Three times a day (TID) | INTRAMUSCULAR | Status: DC | PRN

## 2024-08-16 NOTE — ED Triage Notes (Signed)
 Patient seen at wound clinic today for wound on right foot. Sent to ER by wound doctor and he was concerned because redness is spreading up leg and believes bone is involved. Husband reports patient has been confused today.   Has spine stimulator

## 2024-08-16 NOTE — ED Notes (Signed)
 Pt husband wanted to address concerns about pt's neuro-stimulator. Prior to going to MRI, [employer] must be called to place device into MRI mode. The representatives names are in the case.  He also is concerned about the patient being on too many antibiotics. If unncessary, he would like them not to be given for the sake of her kidneys.  Lastly, he would like pt's wound care specialist, PA Andre Dustman at [facility] notified that she is here. Pt just saw the wound care specialist earlier today.

## 2024-08-16 NOTE — Progress Notes (Signed)
 Pharmacy Antibiotic Note  Melody Brooks is a 68 y.o. female admitted on 08/16/2024 with right foot wound infection/cellulitis.   Pharmacy has been consulted for Vancomycin  dosing. Patient is also receiving ceftriaxone  and Flagyl    Plan: Vancomycin  1000mg  + 1250mg  ordered for total loading dose of 2250mg .  Start Vancomycin  750 mg IV Q 24 hrs. Goal AUC 400-550. Expected AUC: 478 SCr used: 1.85, Vd 0.5  Plan to monitor renal function and adjust dose as needed.    Height: 5' 8 (172.7 cm) Weight: 108.9 kg (240 lb) IBW/kg (Calculated) : 63.9  Temp (24hrs), Avg:97.5 F (36.4 C), Min:97.5 F (36.4 C), Max:97.5 F (36.4 C)  Recent Labs  Lab 08/16/24 1523 08/16/24 1828  WBC 9.8  --   CREATININE 1.85*  --   LATICACIDVEN 2.5* 1.6    Estimated Creatinine Clearance: 37.6 mL/min (A) (by C-G formula based on SCr of 1.85 mg/dL (H)).    Allergies  Allergen Reactions   Lisinopril Hives, Itching, Swelling and Rash    Angioedema    Antimicrobials this admission: 10/16 Vancomycin   >>  10/ 16 Ceftriaxone  >>  10/16 Flagyl>>   Dose adjustments this admission: N/A  Microbiology results: 10/15 BCx: pending   Thank you for allowing pharmacy to be a part of this patient's care.  Estill CHRISTELLA Lutes, PharmD, BCPS Clinical Pharmacist 08/16/2024 9:23 PM

## 2024-08-16 NOTE — ED Provider Notes (Signed)
 [facility] Provider Note    Event Date/Time   First MD Initiated Contact with Patient 08/16/24 1953     (approximate)   History   Wound Infection   HPI  Melody Brooks is a 68 y.o. female presents to the emergency department today because of concerns for wound to her right foot and altered mental status.  Patient has had a wound to this foot for some time.  Has been getting worse.  Went to wound care for the first time today.  They were concerned that the infection might be going to the bone.  They did write the patient to get prescription antibiotics.  She got her first dose this morning however show early there after her husband noticed that she started being confused and altered.  He states that she had a similar confusion once in the past with an infection.  The patient does complain of pain to the right foot.     Physical Exam   Triage Vital Signs: ED Triage Vitals  Encounter Vitals Group     BP 08/16/24 1519 (!) 117/57     Girls Systolic BP Percentile --      Girls Diastolic BP Percentile --      Boys Systolic BP Percentile --      Boys Diastolic BP Percentile --      Pulse Rate 08/16/24 1519 84     Resp 08/16/24 1519 18     Temp 08/16/24 1519 (!) 97.5 F (36.4 C)     Temp Source 08/16/24 1519 Oral     SpO2 08/16/24 1519 100 %     Weight 08/16/24 1508 240 lb (108.9 kg)     Height 08/16/24 1508 5' 8 (1.727 m)     Head Circumference --      Peak Flow --      Pain Score 08/16/24 1508 7     Pain Loc --      Pain Education --      Exclude from Growth Chart --     Most recent vital signs: Vitals:   08/16/24 1519 08/16/24 1815  BP: (!) 117/57 126/88  Pulse: 84 85  Resp: 18 18  Temp: (!) 97.5 F (36.4 C) (!) 97.5 F (36.4 C)  SpO2: 100% 94%   General: Awake, alert, oriented. CV:  Good peripheral perfusion. Regular rate and rhythm. Resp:  Normal effort. Lungs clear. Abd:  No distention.  Other:  Erythema and warmth to the dorsum of  the foot and shin. Roughly .75 cm wound to lateral aspect of the patients right foot.    ED Results / Procedures / Treatments   Labs (all labs ordered are listed, but only abnormal results are displayed) Labs Reviewed  COMPREHENSIVE METABOLIC PANEL WITH GFR - Abnormal; Notable for the following components:      Result Value   Sodium 134 (*)    CO2 21 (*)    Glucose, Bld 191 (*)    BUN 25 (*)    Creatinine, Ser 1.85 (*)    GFR, Estimated 29 (*)    All other components within normal limits  LACTIC ACID, PLASMA - Abnormal; Notable for the following components:   Lactic Acid, Venous 2.5 (*)    All other components within normal limits  CBC WITH DIFFERENTIAL/PLATELET - Abnormal; Notable for the following components:   RBC 3.77 (*)    Hemoglobin 11.6 (*)    Neutro Abs 8.2 (*)    All other  components within normal limits  CULTURE, BLOOD (ROUTINE X 2)  CULTURE, BLOOD (ROUTINE X 2)  LACTIC ACID, PLASMA  PROTIME-INR  URINALYSIS, W/ REFLEX TO CULTURE (INFECTION SUSPECTED)     EKG  I, Guadalupe Eagles, attending physician, personally viewed and interpreted this EKG  EKG Time: 1519 Rate: 84 Rhythm: normal sinus rhythm Axis: left axis deviation Intervals: qtc 380 QRS: narrow, q waves v1 ST changes: no st elevation Impression: abnormal ekg   RADIOLOGY I independently interpreted and visualized the right foot. My interpretation: No acute abnormality Radiology interpretation:  IMPRESSION:  Mildly displaced avulsion fracture of the base of the fifth  metatarsal with nonunion.     PROCEDURES:  Critical Care performed: Yes  CRITICAL CARE Performed by: Guadalupe Eagles   Total critical care time: 30 minutes  Critical care time was exclusive of separately billable procedures and treating other patients.  Critical care was necessary to treat or prevent imminent or life-threatening deterioration.  Critical care was time spent personally by me on the following activities:  development of treatment plan with patient and/or surrogate as well as nursing, discussions with consultants, evaluation of patient's response to treatment, examination of patient, obtaining history from patient or surrogate, ordering and performing treatments and interventions, ordering and review of laboratory studies, ordering and review of radiographic studies, pulse oximetry and re-evaluation of patient's condition.   Procedures    MEDICATIONS ORDERED IN ED: Medications - No data to display   IMPRESSION / MDM / ASSESSMENT AND PLAN / ED COURSE  I reviewed the triage vital signs and the nursing notes.                              Differential diagnosis includes, but is not limited to, cellulitis, wound infection, osteomyelitis.  Patient's presentation is most consistent with acute presentation with potential threat to life or bodily function.   Patient presented to the emergency department today because of concerns for confusion in the setting of right foot wound and infection.  On exam there does appear to be cellulitis surrounding a small wound to the lateral aspect of the right foot.  Blood work did show initial lactic acidosis.  However this did improve on repeat.  Foot x-ray did not show any obvious signs of osteomyelitis.  Will plan on starting IV antibiotics.  Will discussed with the hospitalist service for admission.      FINAL CLINICAL IMPRESSION(S) / ED DIAGNOSES   Final diagnoses:  Cellulitis, unspecified cellulitis site  Wound infection  Elevated lactic acid level     Note:  This document was prepared using Dragon voice recognition software and may include unintentional dictation errors.    Eagles Guadalupe, MD 08/16/24 2101

## 2024-08-16 NOTE — H&P (Signed)
 History and Physical    Melody Brooks FMW:[id number] DOB: 1956-04-02 DOA: 08/16/2024  Referring MD/NP/PA:   PCP: Melody Reyes BIRCH, MD   Patient coming from:  The patient is coming from home.     Chief Complaint: right foot wound with infection  HPI: Melody Brooks is a 68 y.o. female with medical history significant of DM, HTN, HLD, dCHF, CKD-3b, angioedema, chronic pain, anemia, obesity, s/p spinal cord stimulator placement, who presents with right foot wound with infection.  Patient was recently hospitalized from 9/17 - 9/22 due to spinal cord stimulator surgical site infection. Cultures were obtained growing MRSA as of 9/21. S/p ID, it appeared she had a superficial abscess that did not involved the pocket. Pt was discharged on linezolid  for 1 week. Pt also had chronic right foot wound with infection for more than 4 months.  Patient was discharged on Levaquin  in addition to linezolid . Pt states that her spinal cord stimulator surgical site infection has healed, but her right foot pain has been worsening.   Pt was seen at wound clinic today for right foot wound. and found to have worsening infection. Her right food redness and swelling are spreading up to lower leg, concerning for osteomyelitis, and then sent to ED for further evaluation and treatment.  Pt was given linezolid  prescription.  Patient took 1 pill before coming to ED. Patient has moderate pain in the right foot, which is constant, aching, nonradiating, aggravated by walking.  No fever or chills.  Denies chest pain, cough, SOB.  No nausea, vomiting, diarrhea or abdominal pain.  No symptoms of UTI.  Her husband noticed that pt started being confused and altered.  He states that pt had a similar confusion once in the past with an infection.  When I saw pt in ED, she is lethargic, but easily arousable.  She is oriented x 3.  She answered all questions appropriately.  She moves all extremities normally.  No facial droop or slurred  speech.   Data reviewed independently and ED Course: pt was found to have WBC 9.8, worsening renal function, INR 1.0, lactic acid 2.5- > 1.6, temperature 97.5, blood pressure 95/77, heart rate 81, RR 18, oxygen saturation 94% on room air.  Patient is admitted to telemetry bed for observation.  Dr. Malvin of podiatry is consulted.  X-ray of right foot: Mildly displaced avulsion fracture of the base of the fifth metatarsal with nonunion.  CT-right foot without contrast: pending    EKG: I have personally reviewed.  Sinus rhythm, QTc 380, LAD, low voltage, poor R wave progression.   Review of Systems:   General: no fevers, chills, no body weight gain, has fatigue HEENT: no blurry vision, hearing changes or sore throat Respiratory: no dyspnea, coughing, wheezing CV: no chest pain, no palpitations GI: no nausea, vomiting, abdominal pain, diarrhea, constipation GU: no dysuria, burning on urination, increased urinary frequency, hematuria  Ext: has right foot wound, and right foot and lower leg swelling and pain.  Neuro: no unilateral weakness, numbness, or tingling, no vision change or hearing loss. Has lethargy. Skin: no rash, no skin tear. MSK: No muscle spasm, no deformity, no limitation of range of movement in spin Heme: No easy bruising.  Travel history: No recent long distant travel.   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 pt stated on around October of 2022   Angioedema    Arterial fibromuscular dysplasia 07/08/2015   Arthritis    Cancer (HCC)    Carotid stenosis 12/16/2017   Chronic kidney disease    Chronic pain syndrome 03/20/2024   Chronic urticaria 09/10/2019   Constipation    Diabetes mellitus with polyneuropathy (HCC) 07/17/2003   Fatigue    Fibromuscular dysplasia    Generalized  headaches    GERD (gastroesophageal reflux disease)    Hemorrhoids    Hiatal hernia    Hypertension    Idiopathic scoliosis 07/30/2002   Lumbar facet arthropathy 03/20/2024   Lumbosacral radiculitis 12/11/2014   Neuropathy    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   a.) presurgical PCR (+) 06/13/2024 prior to THORACIC LAMINECTOMY FOR SCS IMPLANTATION   Palpitations    Pneumonia    Rectal bleeding    Rectal pain    Spinal stenosis 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 03/20/2024    Past Surgical History:  Procedure Laterality Date   Basal cell cancer  removal     colonoscopy  2010   EXCISION/RELEASE BURSA HIP Right 12/23/2022   Procedure: OPEN EXCISION OF RIGHT TROCHANTERIC BURSA;  Surgeon: Edie Norleen PARAS, MD;  Location: [facility] ORS;  Service: Orthopedics;  Laterality: Right;   EXCISIONAL HEMORRHOIDECTOMY  03/2012   IR ANGIO EXTERNAL CAROTID SEL EXT CAROTID BILAT MOD SED  09/04/2021   IR ANGIO INTRA EXTRACRAN SEL INTERNAL CAROTID BILAT MOD SED  09/04/2021   IR ANGIO VERTEBRAL SEL SUBCLAVIAN INNOMINATE UNI L MOD SED  09/04/2021   IR ANGIO VERTEBRAL SEL VERTEBRAL UNI R MOD SED  09/04/2021   IR US  GUIDE VASC ACCESS RIGHT  09/04/2021   LUMBAR WOUND DEBRIDEMENT N/A 07/22/2024   Procedure: Wound Washout, IPG site;  Surgeon: Claudene Penne ORN, MD;  Location: [facility] ORS;  Service: Neurosurgery;  Laterality: N/A;   right and left eye cataract surgery  2006, 2008   right in 2006 and left 2008   THORACIC LAMINECTOMY FOR SPINAL CORD STIMULATOR N/A 06/22/2024   Procedure: THORACIC LAMINECTOMY FOR SPINAL CORD STIMULATOR;  Surgeon: Clois Fret, MD;  Location: [facility] ORS;  Service: Neurosurgery;  Laterality: N/A;  THORACIC LAMINECTOMY FOR SPINAL CORD STIMULATOR PLACEMENT   TUBAL LIGATION  1995    Social History:  reports that she has never smoked. She has never used smokeless tobacco. She reports that she does not drink alcohol  and does not use drugs.  Family History:  Family History  Problem  Relation Age of Onset   Heart failure Mother    COPD Mother    Hypertension Mother    Arthritis Mother    Cancer Father        prostate   Parkinson's disease Father    Colon cancer Father    Cancer Maternal Aunt        colon   Cancer Paternal Grandmother        stomach   Diabetes Brother    Diabetes Brother      Prior to Admission medications   Medication Sig Start Date End Date Taking? Authorizing Provider  acetaminophen  (TYLENOL ) 500 MG tablet Take 1,000 mg by mouth every 6 (six) hours as needed for moderate pain.    [provider]  amLODipine  (NORVASC ) 5 MG tablet Take 5 mg by mouth daily. 11/25/23   [provider]  aspirin  81 MG tablet Take 81 mg by mouth daily.    [provider]  Chlorhexidine  Gluconate 2 % SOLN Apply 15 mLs topically daily. 07/31/24   Fayette Bodily, MD  Cholecalciferol  (DIALYVITE VITAMIN D  5000) 125 MCG (5000 UT) capsule Take 5,000 Units by mouth daily.    [provider]  collagenase  (SANTYL ) 250 UNIT/GM ointment Apply 1 Application topically daily. 1.1 cm x 0.6 cm x 0.3 cm 07/12/24   Tobie Franky SQUIBB, DPM  Cyanocobalamin  (B-12 IJ) Inject 1,000 Units as directed every 14 (fourteen) days.    [provider]  cyclobenzaprine  (FLEXERIL ) 10 MG tablet Take 10 mg by mouth at bedtime. 07/13/24   [provider]  dapagliflozin propanediol (FARXIGA) 5 MG TABS tablet Take 5 mg by mouth. 07/18/24 07/18/25  [provider]  diphenhydrAMINE  (BENADRYL ) 25 mg capsule Take 25 mg by mouth at bedtime.    [provider]  EPIPEN  2-PAK 0.3 MG/0.3ML SOAJ injection Inject 0.3 mg into the muscle as needed for anaphylaxis. 10/05/15   [provider]  esomeprazole (NEXIUM) 20 MG capsule Take 40 mg by mouth daily at 12 noon.    [provider]  estrogen, conjugated,-medroxyprogesterone (PREMPRO) 0.3-1.5 MG tablet Take 1 tablet by mouth daily.    [provider]  famotidine  (PEPCID ) 20  MG tablet Take 20 mg by mouth at bedtime.    [provider]  ferrous sulfate  325 (65 FE) MG tablet Take 325 mg by mouth at bedtime.    [provider]  fluticasone -salmeterol (ADVAIR) 250-50 MCG/ACT AEPB Inhale 1 puff into the lungs 2 (two) times daily as needed (chest congestion).    [provider]  furosemide  (LASIX ) 20 MG tablet Take 20 mg by mouth daily.    [provider]  glipiZIDE  (GLUCOTROL  XL) 2.5 MG 24 hr tablet TAKE 1 TABLET BY MOUTH  DAILY WITH BREAKFAST 04/21/20   Chrismon, Marinda E, PA-C  JANUMET  50-1000 MG tablet TAKE 1 TABLET BY MOUTH  TWICE DAILY Patient taking differently: Take 1 tablet by mouth daily. 04/21/20   Chrismon, Marinda BRAVO, PA-C  LORazepam  (ATIVAN ) 2 MG tablet Take 2 mg by mouth as needed.    [provider]  magnesium  oxide (MAG-OX) 400 (240 Mg) MG tablet Take 400 mg by mouth daily.    [provider]  mupirocin  ointment (BACTROBAN ) 2 % Apply 1 Application topically 2 (two) times daily. 07/31/24   Fayette Bodily, MD  OZEMPIC, 1 MG/DOSE, 4 MG/3ML SOPN Inject 0.75 mLs into the skin once a week. 07/07/24   [provider]  pioglitazone  (ACTOS ) 45 MG tablet TAKE 1 TABLET BY MOUTH AT  BEDTIME 04/11/20   Chrismon, Marinda BRAVO, PA-C  predniSONE  (DELTASONE ) 10 MG tablet Take 10 mg by mouth as needed.    [provider]  pregabalin  (LYRICA ) 100 MG capsule Take 1 capsule by mouth 2 (two) times daily.    [provider]  propranolol  ER (INDERAL  LA) 60 MG 24 hr capsule Take 60 mg by mouth daily.    [provider]  rosuvastatin  (CRESTOR ) 10 MG tablet Take 10 mg by mouth daily. 03/11/21   [provider]  spironolactone  (ALDACTONE ) 25 MG tablet Take 25 mg by mouth daily.    [provider]  topiramate  (TOPAMAX ) 50 MG tablet Take 50 mg by mouth 2 (two) times daily. 06/03/24   [provider]  zinc  gluconate 50 MG tablet Take 50 mg by mouth daily.    [provider]  zolpidem  (AMBIEN ) 5 MG tablet Take 5 mg by mouth at bedtime.  [provider]    Physical Exam: Vitals:   08/16/24 2000 08/16/24 2030 08/16/24 2100 08/16/24 2250  BP: 95/77 (!) 117/52 (!) 125/41 (!) 126/55  Pulse: 81 85 80 76  Resp:    16  Temp:    98 F (36.7 C)  TempSrc:    Oral  SpO2: 100% 100% 100% 100%  Weight:      Height:       General: Not in acute distress HEENT:       Eyes: PERRL, EOMI, no jaundice       ENT: No discharge from the ears and nose, no pharynx injection, no tonsillar enlargement.        Neck: No JVD, no bruit, no mass felt. Heme: No neck lymph node enlargement. Cardiac: S1/S2, RRR, No murmurs, No gallops or rubs. Respiratory: No rales, wheezing, rhonchi or rubs. GI: Soft, nondistended, nontender, no rebound pain, no organomegaly, BS present. GU: No hematuria Ext:  1+DP/PT pulse bilaterally. Has a deep wound in the lateral side of right foot. Has swelling, tenderness, redness, warmth, spreading up to lower leg       Musculoskeletal: No joint deformities, No joint redness or warmth, no limitation of ROM in spin. Skin: No rashes.  Neuro: Lethargic, but easily arousable, still oriented X3, cranial nerves II-XII grossly intact, moves all extremities normally.  Psych: Patient is not psychotic, no suicidal or hemocidal ideation.  Labs on Admission: I have personally reviewed following labs and imaging studies  CBC: Recent Labs  Lab 08/16/24 1523  WBC 9.8  NEUTROABS 8.2*  HGB 11.6*  HCT 36.1  MCV 95.8  PLT 268   Basic Metabolic Panel: Recent Labs  Lab 08/16/24 1523  NA 134*  K 4.3  CL 100  CO2 21*  GLUCOSE 191*  BUN 25*  CREATININE 1.85*  CALCIUM  9.7   GFR: Estimated Creatinine Clearance: 37.6 mL/min (A) (by C-G formula based on SCr of 1.85 mg/dL (H)). Liver Function Tests: Recent Labs  Lab 08/16/24 1523  AST 19  ALT 13  ALKPHOS 70  BILITOT 0.8  PROT 7.5  ALBUMIN 3.9   No results for input(s): LIPASE,  AMYLASE in the last 168 hours. No results for input(s): AMMONIA in the last 168 hours. Coagulation Profile: Recent Labs  Lab 08/16/24 1523  INR 1.0   Cardiac Enzymes: No results for input(s): CKTOTAL, CKMB, CKMBINDEX, TROPONINI in the last 168 hours. BNP (last 3 results) No results for input(s): PROBNP in the last 8760 hours. HbA1C: No results for input(s): HGBA1C in the last 72 hours. CBG: Recent Labs  Lab 08/16/24 2241  GLUCAP 110*   Lipid Profile: No results for input(s): CHOL, HDL, LDLCALC, TRIG, CHOLHDL, LDLDIRECT in the last 72 hours. Thyroid  Function Tests: No results for input(s): TSH, T4TOTAL, FREET4, T3FREE, THYROIDAB in the last 72 hours. Anemia Panel: No results for input(s): VITAMINB12, FOLATE, FERRITIN, TIBC, IRON, RETICCTPCT in the last 72 hours. Urine analysis:    Component Value Date/Time   COLORURINE YELLOW (A) 08/16/2024 2204   APPEARANCEUR HAZY (A) 08/16/2024 2204   LABSPEC 1.016 08/16/2024 2204   PHURINE 5.0 08/16/2024 2204   GLUCOSEU 50 (A) 08/16/2024 2204   HGBUR NEGATIVE 08/16/2024 2204   BILIRUBINUR NEGATIVE 08/16/2024 2204   BILIRUBINUR neg 05/17/2019 1356   KETONESUR NEGATIVE 08/16/2024 2204   PROTEINUR NEGATIVE 08/16/2024 2204   UROBILINOGEN 0.2 05/17/2019 1356   NITRITE NEGATIVE 08/16/2024 2204   LEUKOCYTESUR TRACE (A) 08/16/2024 2204   Sepsis Labs: @LABRCNTIP (procalcitonin:4,lacticidven:4) )No results found  for this or any previous visit (from the past 240 hours).   Radiological Exams on Admission:   Assessment/Plan Principal Problem:   Diabetic foot infection (HCC) Active Problems:   Cellulitis of right lower extremity   Essential hypertension   Hyperlipemia   Chronic diastolic CHF (congestive heart failure) (HCC)   Type 2 diabetes mellitus with peripheral neuropathy (HCC)   Anemia, iron deficiency   Acute renal failure superimposed on stage 3b chronic kidney disease (HCC)    Obesity   Assessment and Plan:   Diabetic foot infection_right foot with wound: Concerning for osteomyelitis.  Patient also has bone fracture.  X-ray showed mildly displaced avulsion fracture of the base of the fifth metatarsal with nonunion.  - will admit to tele bed as inpatient - Empiric antimicrobial treatment with vancomycin  and Rocephin  - Add flagyl orally - Blood cultures x 2  - ESR and CRP - wound care consult - CT-right right foot - IVF: NS at 75 cc/h - consulted Dr. Malvin of podiatry  Cellulitis of right lower extremity -Antibiotics as above - Follow-up lower extremity venous Doppler to rule out DVT of right leg  Essential hypertension -IV hydralazine as needed - Propranolol   Hyperlipemia -Crestor   Chronic diastolic CHF (congestive heart failure) (HCC): 2D echo on 04/15/2022 showed EF> 55% with grade 1 diastolic dysfunction.  BNP normal 66.  CHF seem to be compensated. - Hold Lasix  and spironolactone  due to worsening renal function  Type 2 diabetes mellitus with peripheral neuropathy [facility]): Recent A1c 5.7, well-controlled.  Patient is taking glipizide , Janumet , Ozempic -ssi  Anemia, iron deficiency: Hemoglobin stable 11.6. -Continue iron supplement  Acute renal failure superimposed on stage 3b chronic kidney disease [facility]): Patient baseline creatinine 1.46 on 07/23/2024.  Her creatinine is 1.85, BUN 25, GFR 29. -IV fluid as above - Hold Lasix  and spironolactone   Obesity: Patient has Obesity Class II, with body weight 108.9  Kg and BMI 36.49  kg/m2.  - Encourage losing weight - Exercise and healthy diet        DVT ppx: SQ Heparin           Code Status: Full code   Family Communication:   Yes, patient's husband at bed side.        Disposition Plan:  Anticipate discharge back to previous environment  Consults called:  Dr. Malvin of podiatry is consulted.  Admission status and Level of care: Telemetry Medical: as inpt        Dispo: The  patient is from: Home              Anticipated d/c is to: Home              Anticipated d/c date is: 2 days              Patient currently is not medically stable to d/c.    Severity of Illness:  The appropriate patient status for this patient is INPATIENT. Inpatient status is judged to be reasonable and necessary in order to provide the required intensity of service to ensure the patient's safety. The patient's presenting symptoms, physical exam findings, and initial radiographic and laboratory data in the context of their chronic comorbidities is felt to place them at high risk for further clinical deterioration. Furthermore, it is not anticipated that the patient will be medically stable for discharge from the hospital within 2 midnights of admission.   * I certify that at the point of admission it is my clinical judgment that the patient  will require inpatient hospital care spanning beyond 2 midnights from the point of admission due to high intensity of service, high risk for further deterioration and high frequency of surveillance required.*       Date of Service 08/17/2024    Melody Brooks   If 7PM-7AM, please contact night-coverage [email or web address] 08/17/2024, 1:42 AM

## 2024-08-16 NOTE — ED Notes (Signed)
 Drew pt blood at this time, had to restick pt at this time for Lavender and blue top labwork that needed to be drawn. Pt tolerated well. Drew pt blood from right antecubital and left hand.

## 2024-08-17 ENCOUNTER — Inpatient Hospital Stay

## 2024-08-17 ENCOUNTER — Encounter
Admission: EM | Disposition: A | Discharge status: Skilled Nursing Facility | Payer: Self-pay | Source: Ambulatory Visit | Attending: Osteopathic Medicine

## 2024-08-17 ENCOUNTER — Encounter: Payer: Self-pay | Admitting: Internal Medicine

## 2024-08-17 ENCOUNTER — Telehealth: Payer: Self-pay | Admitting: Neurosurgery

## 2024-08-17 DIAGNOSIS — M84474A Pathological fracture, right foot, initial encounter for fracture: Secondary | ICD-10-CM

## 2024-08-17 DIAGNOSIS — L089 Local infection of the skin and subcutaneous tissue, unspecified: Secondary | ICD-10-CM | POA: Diagnosis not present

## 2024-08-17 DIAGNOSIS — M86171 Other acute osteomyelitis, right ankle and foot: Secondary | ICD-10-CM | POA: Diagnosis not present

## 2024-08-17 DIAGNOSIS — M84674A Pathological fracture in other disease, right foot, initial encounter for fracture: Secondary | ICD-10-CM | POA: Diagnosis not present

## 2024-08-17 DIAGNOSIS — E11628 Type 2 diabetes mellitus with other skin complications: Secondary | ICD-10-CM | POA: Diagnosis not present

## 2024-08-17 DIAGNOSIS — M869 Osteomyelitis, unspecified: Secondary | ICD-10-CM

## 2024-08-17 HISTORY — PX: TRANSMETATARSAL AMPUTATION: SHX6197

## 2024-08-17 HISTORY — PX: IRRIGATION AND DEBRIDEMENT FOOT: SHX6602

## 2024-08-17 LAB — BASIC METABOLIC PANEL WITH GFR
Anion gap: 9 (ref 5–15)
BUN: 25 mg/dL — ABNORMAL HIGH (ref 8–23)
CO2: 19 mmol/L — ABNORMAL LOW (ref 22–32)
Calcium: 8.6 mg/dL — ABNORMAL LOW (ref 8.9–10.3)
Chloride: 106 mmol/L (ref 98–111)
Creatinine, Ser: 1.71 mg/dL — ABNORMAL HIGH (ref 0.44–1.00)
GFR, Estimated: 32 mL/min — ABNORMAL LOW (ref >60)
Glucose, Bld: 176 mg/dL — ABNORMAL HIGH (ref 70–99)
Potassium: 3.8 mmol/L (ref 3.5–5.1)
Sodium: 134 mmol/L — ABNORMAL LOW (ref 135–145)

## 2024-08-17 LAB — GLUCOSE, CAPILLARY
Glucose-Capillary: 133 mg/dL — ABNORMAL HIGH (ref 70–99)
Glucose-Capillary: 61 mg/dL — ABNORMAL LOW (ref 70–99)
Glucose-Capillary: 27 mg/dL — CL (ref 70–99)
Glucose-Capillary: 65 mg/dL — ABNORMAL LOW (ref 70–99)
Glucose-Capillary: 76 mg/dL (ref 70–99)
Glucose-Capillary: 108 mg/dL — ABNORMAL HIGH (ref 70–99)
Glucose-Capillary: 70 mg/dL (ref 70–99)
Glucose-Capillary: 121 mg/dL — ABNORMAL HIGH (ref 70–99)

## 2024-08-17 LAB — C-REACTIVE PROTEIN: CRP: 12.6 mg/dL — ABNORMAL HIGH (ref <1.0)

## 2024-08-17 LAB — CBC
HCT: 29.3 % — ABNORMAL LOW (ref 36.0–46.0)
Hemoglobin: 9.7 g/dL — ABNORMAL LOW (ref 12.0–15.0)
MCH: 30.7 pg (ref 26.0–34.0)
MCHC: 33.1 g/dL (ref 30.0–36.0)
MCV: 92.7 fL (ref 80.0–100.0)
Platelets: 225 K/uL (ref 150–400)
RBC: 3.16 MIL/uL — ABNORMAL LOW (ref 3.87–5.11)
RDW: 14.6 % (ref 11.5–15.5)
WBC: 6.7 K/uL (ref 4.0–10.5)
nRBC: 0 % (ref 0.0–0.2)

## 2024-08-17 SURGERY — IRRIGATION AND DEBRIDEMENT FOOT
Anesthesia: General | Site: Foot | Laterality: Right

## 2024-08-17 MED ORDER — LIDOCAINE HCL 1 % IJ SOLN
INTRAMUSCULAR | Status: DC | PRN
  Administered 2024-08-17: 10 mL via INTRAMUSCULAR

## 2024-08-17 MED ORDER — PROPOFOL 10 MG/ML IV BOLUS
INTRAVENOUS | Status: AC
  Filled 2024-08-17: qty 20

## 2024-08-17 MED ORDER — COLLAGENASE 250 UNIT/GM EX OINT
TOPICAL_OINTMENT | Freq: Every day | CUTANEOUS | Status: DC
Start: 2024-08-17 — End: 2024-08-23
  Filled 2024-08-17: qty 30

## 2024-08-17 MED ORDER — LINEZOLID 600 MG/300ML IV SOLN
600.0000 mg | Freq: Two times a day (BID) | INTRAVENOUS | Status: DC
  Administered 2024-08-17 – 2024-08-21 (×7): 600 mg via INTRAVENOUS
  Filled 2024-08-17 (×8): qty 300

## 2024-08-17 MED ORDER — DEXTROSE 50 % IV SOLN: 1.0000 | INTRAVENOUS | Status: DC | PRN

## 2024-08-17 MED ORDER — PHENYLEPHRINE 80 MCG/ML (10ML) SYRINGE FOR IV PUSH (FOR BLOOD PRESSURE SUPPORT)
PREFILLED_SYRINGE | INTRAVENOUS | Status: DC | PRN
  Administered 2024-08-17: 80 ug via INTRAVENOUS

## 2024-08-17 MED ORDER — DEXTROSE 50 % IV SOLN
INTRAVENOUS | Status: AC
  Filled 2024-08-17: qty 50

## 2024-08-17 MED ORDER — CHLORHEXIDINE GLUCONATE 0.12 % MT SOLN: 15.0000 mL | Freq: Once | OROMUCOSAL | Status: DC

## 2024-08-17 MED ORDER — HEPARIN SODIUM (PORCINE) 5000 UNIT/ML IJ SOLN
5000.0000 [IU] | Freq: Three times a day (TID) | INTRAMUSCULAR | Status: DC
  Administered 2024-08-17 – 2024-08-23 (×18): 5000 [IU] via SUBCUTANEOUS
  Filled 2024-08-17 (×18): qty 1

## 2024-08-17 MED ORDER — FENTANYL CITRATE (PF) 100 MCG/2ML IJ SOLN
INTRAMUSCULAR | Status: AC
  Filled 2024-08-17: qty 2

## 2024-08-17 MED ORDER — MIDAZOLAM HCL 2 MG/2ML IJ SOLN
INTRAMUSCULAR | Status: AC
  Filled 2024-08-17: qty 2

## 2024-08-17 MED ORDER — PROPOFOL 10 MG/ML IV BOLUS
INTRAVENOUS | Status: DC | PRN
  Administered 2024-08-17: 120 mg via INTRAVENOUS

## 2024-08-17 MED ORDER — ORAL CARE MOUTH RINSE: 15.0000 mL | Freq: Once | OROMUCOSAL | Status: DC

## 2024-08-17 MED ORDER — LIDOCAINE HCL (CARDIAC) PF 100 MG/5ML IV SOSY
PREFILLED_SYRINGE | INTRAVENOUS | Status: DC | PRN
  Administered 2024-08-17: 100 mg via INTRAVENOUS

## 2024-08-17 MED ORDER — LIDOCAINE HCL (PF) 1 % IJ SOLN
INTRAMUSCULAR | Status: AC
  Filled 2024-08-17: qty 30

## 2024-08-17 MED ORDER — HYDROMORPHONE HCL 1 MG/ML IJ SOLN
0.5000 mg | INTRAMUSCULAR | Status: DC | PRN
  Administered 2024-08-17 – 2024-08-18 (×2): 0.5 mg via INTRAVENOUS
  Filled 2024-08-17 (×2): qty 0.5

## 2024-08-17 MED ORDER — FENTANYL CITRATE (PF) 100 MCG/2ML IJ SOLN
INTRAMUSCULAR | Status: DC | PRN
  Administered 2024-08-17: 50 ug via INTRAVENOUS

## 2024-08-17 MED ORDER — PIPERACILLIN-TAZOBACTAM 3.375 G IVPB
3.3750 g | Freq: Three times a day (TID) | INTRAVENOUS | Status: DC
  Administered 2024-08-17 – 2024-08-20 (×8): 3.375 g via INTRAVENOUS
  Filled 2024-08-17 (×9): qty 50

## 2024-08-17 MED ORDER — FENTANYL CITRATE (PF) 100 MCG/2ML IJ SOLN: 25.0000 ug | INTRAMUSCULAR | Status: DC | PRN

## 2024-08-17 MED ORDER — DROPERIDOL 2.5 MG/ML IJ SOLN: 0.6250 mg | Freq: Once | INTRAMUSCULAR | Status: DC | PRN

## 2024-08-17 MED ORDER — LACTATED RINGERS IV SOLN: INTRAVENOUS | Status: DC | PRN

## 2024-08-17 MED ORDER — DEXTROSE 50 % IV SOLN
25.0000 mL | Freq: Once | INTRAVENOUS | Status: AC
  Administered 2024-08-17: 25 mL via INTRAVENOUS

## 2024-08-17 MED ORDER — OXYCODONE HCL 5 MG PO TABS
5.0000 mg | ORAL_TABLET | ORAL | Status: DC | PRN
  Administered 2024-08-18 – 2024-08-19 (×3): 5 mg via ORAL
  Administered 2024-08-22: 10 mg via ORAL
  Filled 2024-08-17 (×2): qty 1
  Filled 2024-08-17: qty 2
  Filled 2024-08-17: qty 1

## 2024-08-17 MED ORDER — SODIUM CHLORIDE 0.9 % IV SOLN: INTRAVENOUS | Status: DC

## 2024-08-17 MED ORDER — MIDAZOLAM HCL (PF) 2 MG/2ML IJ SOLN
INTRAMUSCULAR | Status: DC | PRN
  Administered 2024-08-17: 2 mg via INTRAVENOUS

## 2024-08-17 MED ORDER — PROPOFOL 500 MG/50ML IV EMUL
INTRAVENOUS | Status: DC | PRN
  Administered 2024-08-17: 150 ug/kg/min via INTRAVENOUS

## 2024-08-17 MED ORDER — SODIUM CHLORIDE 0.9 % IR SOLN
Status: DC | PRN
  Administered 2024-08-17: 3000 mL

## 2024-08-17 MED ADMIN — Sodium Chloride Irrigation Soln 0.9%: 500 mL | NDC 99999050048

## 2024-08-17 MED FILL — Bupivacaine HCl Preservative Free (PF) Inj 0.5%: INTRAMUSCULAR | Qty: 30 | Status: AC

## 2024-08-17 MED FILL — Lidocaine HCl Local Preservative Free (PF) Inj 2%: INTRAMUSCULAR | Qty: 5 | Status: AC

## 2024-08-17 MED FILL — Propofol IV Emul 1000 MG/100ML (10 MG/ML): INTRAVENOUS | Qty: 100 | Status: AC

## 2024-08-17 SURGICAL SUPPLY — 59 items
BLADE MED AGGRESSIVE (BLADE) IMPLANT
BLADE OSC/SAGITTAL MD 5.5X18 (BLADE) IMPLANT
BLADE SURG 15 STRL LF DISP TIS (BLADE) ×2 IMPLANT
BLADE SURG MINI STRL (BLADE) IMPLANT
BNDG COHESIVE 4X5 TAN STRL LF (GAUZE/BANDAGES/DRESSINGS) ×1 IMPLANT
BNDG ELASTIC 4INX 5YD STR LF (GAUZE/BANDAGES/DRESSINGS) ×1 IMPLANT
BNDG ESMARCH 4X12 STRL LF (GAUZE/BANDAGES/DRESSINGS) ×1 IMPLANT
BNDG GAUZE DERMACEA FLUFF 4 (GAUZE/BANDAGES/DRESSINGS) ×1 IMPLANT
BNDG STRETCH GAUZE 3IN X12FT (GAUZE/BANDAGES/DRESSINGS) ×1 IMPLANT
CNTNR URN SCR LID CUP LEK RST (MISCELLANEOUS) IMPLANT
CUFF TOURN SGL QUICK 12 (TOURNIQUET CUFF) IMPLANT
CUFF TOURN SGL QUICK 18X4 (TOURNIQUET CUFF) IMPLANT
CUFF TRNQT CYL 24X4X16.5-23 (TOURNIQUET CUFF) IMPLANT
DRAPE FLUOR MINI C-ARM 54X84 (DRAPES) IMPLANT
DRSG EMULSION OIL 3X3 NADH (GAUZE/BANDAGES/DRESSINGS) IMPLANT
DRSG EMULSION OIL 3X8 NADH (GAUZE/BANDAGES/DRESSINGS) ×1 IMPLANT
DRSG TELFA 3X8 NADH STRL (GAUZE/BANDAGES/DRESSINGS) ×1 IMPLANT
DURAPREP 26ML APPLICATOR (WOUND CARE) ×1 IMPLANT
ELECTRODE REM PT RTRN 9FT ADLT (ELECTROSURGICAL) ×1 IMPLANT
GAUZE PACKING 0.25INX5YD STRL (GAUZE/BANDAGES/DRESSINGS) IMPLANT
GAUZE PAD ABD 8X10 STRL (GAUZE/BANDAGES/DRESSINGS) IMPLANT
GAUZE SPONGE 4X4 12PLY STRL (GAUZE/BANDAGES/DRESSINGS) ×1 IMPLANT
GAUZE STRETCH 2X75IN STRL (MISCELLANEOUS) ×1 IMPLANT
GAUZE XEROFORM 1X8 LF (GAUZE/BANDAGES/DRESSINGS) ×1 IMPLANT
GLOVE BIOGEL PI IND STRL 7.5 (GLOVE) ×1 IMPLANT
GLOVE SURG SYN 7.5 PF PI (GLOVE) ×1 IMPLANT
GOWN STRL REUS W/ TWL XL LVL3 (GOWN DISPOSABLE) ×1 IMPLANT
GOWN STRL REUS W/TWL MED LVL3 (GOWN DISPOSABLE) ×1 IMPLANT
HANDPIECE VERSAJET DEBRIDEMENT (MISCELLANEOUS) IMPLANT
IV 0.9% NACL 1000 ML (IV SOLUTION) ×1 IMPLANT
KIT TURNOVER KIT A (KITS) ×1 IMPLANT
LABEL OR SOLS (LABEL) ×1 IMPLANT
MANIFOLD NEPTUNE II (INSTRUMENTS) ×1 IMPLANT
NDL BIOPSY JAMSHIDI 11X6 (NEEDLE) IMPLANT
NDL FILTER BLUNT 18X1 1/2 (NEEDLE) ×1 IMPLANT
NDL HYPO 25X1 1.5 SAFETY (NEEDLE) ×1 IMPLANT
NEEDLE BIOPSY JAMSHIDI 11X6 (NEEDLE) IMPLANT
NEEDLE FILTER BLUNT 18X1 1/2 (NEEDLE) IMPLANT
NEEDLE HYPO 25X1 1.5 SAFETY (NEEDLE) IMPLANT
NS IRRIG 500ML POUR BTL (IV SOLUTION) ×1 IMPLANT
PACK EXTREMITY ARMC (MISCELLANEOUS) ×1 IMPLANT
PACKING GAUZE IODOFORM 1INX5YD (GAUZE/BANDAGES/DRESSINGS) IMPLANT
PAD ABD DERMACEA PRESS 5X9 (GAUZE/BANDAGES/DRESSINGS) ×1 IMPLANT
PAD PREP OB/GYN DISP 24X41 (PERSONAL CARE ITEMS) ×1 IMPLANT
PENCIL SMOKE EVACUATOR (MISCELLANEOUS) ×1 IMPLANT
SOL .9 NS 3000ML IRR UROMATIC (IV SOLUTION) IMPLANT
SOLUTION PREP PVP 2OZ (MISCELLANEOUS) ×1 IMPLANT
STAPLER SKIN PROX 35W (STAPLE) ×1 IMPLANT
STOCKINETTE 48X4 2 PLY STRL (GAUZE/BANDAGES/DRESSINGS) ×1 IMPLANT
STOCKINETTE IMPERVIOUS 9X36 MD (GAUZE/BANDAGES/DRESSINGS) ×1 IMPLANT
SUT MNCRL AB 3-0 PS2 27 (SUTURE) IMPLANT
SUT PROLENE 2 0 FS (SUTURE) IMPLANT
SUT PROLENE 3 0 PS 2 (SUTURE) ×1 IMPLANT
SUTURE ETHLN 4-0 FS2 18XMF BLK (SUTURE) IMPLANT
SWAB CULTURE AMIES ANAERIB BLU (MISCELLANEOUS) IMPLANT
SYR 10ML LL (SYRINGE) ×2 IMPLANT
TIP FAN IRRIG PULSAVAC PLUS (DISPOSABLE) IMPLANT
TRAP FLUID SMOKE EVACUATOR (MISCELLANEOUS) ×1 IMPLANT
WATER STERILE IRR 500ML POUR (IV SOLUTION) ×1 IMPLANT

## 2024-08-17 NOTE — Anesthesia Postprocedure Evaluation (Signed)
 Anesthesia Post Note  Patient: Melody Brooks  Procedure(s) Performed: IRRIGATION AND DEBRIDEMENT FOOT (Right: Foot) AMPUTATION, PARTIAL FIFTH METATARSAL (Right: Foot)  Patient location during evaluation: PACU Anesthesia Type: General Level of consciousness: awake and alert Pain management: pain level controlled Vital Signs Assessment: post-procedure vital signs reviewed and stable Respiratory status: spontaneous breathing, nonlabored ventilation, respiratory function stable and patient connected to nasal cannula oxygen Cardiovascular status: blood pressure returned to baseline and stable Postop Assessment: no apparent nausea or vomiting Anesthetic complications: no   No notable events documented.   Last Vitals:  Vitals:   08/17/24 1415 08/17/24 1430  BP: (!) 125/59 (!) 126/56  Pulse: 75 77  Resp: 15 17  Temp: 36.7 C   SpO2: 96% 98%    Last Pain:  Vitals:   08/17/24 1430  TempSrc:   PainSc: 0-No pain                 [name]

## 2024-08-17 NOTE — Op Note (Signed)
 Full Operative Report  Date of Operation: 3:37 PM, 08/17/2024   Patient: Melody Brooks - 68 y.o. female  Surgeon: Malvin Marsa [facility], DPM   Assistant: None  Diagnosis: Ulceration lateral midfoot right foot with necrosis of bone, osteomyelitis of fifth metatarsal base  Procedure:  1.  Incision and drainage with fifth metatarsal base resection, right foot 2.  Complex closure of wound right lateral midfoot approximately 5 x 1 cm    Anesthesia: General  No responsible provider has been recorded for the case.  Anesthesiologist: Chesley Lendia CROME, MD CRNA: Delores Evalene BROCKS, CRNA; Brien Sotero PARAS, CRNA   Estimated Blood Loss: Minimal   Hemostasis: 1) Anatomical dissection, mechanical compression, electrocautery 2) ankle tourniquet inflated to 250 mmHg  Implants: * No implants in log *  Materials: Prolene 2-0 and skin staples  Injectables: 1) Pre-operatively: 20 cc of 50:50 mixture 1%lidocaine  plain and 0.5% marcaine  plain 2) Post-operatively: None   Specimens: - Pathology: Fifth metatarsal base with distal margin inked - Microbiology: Bone culture fifth metatarsal styloid process, separately swab culture, separately deep tissue culture from the ulceration   Antibiotics: IV antibiotics given per schedule on the floor  Drains: None  Complications: Patient tolerated the procedure well without complication.   Operative findings: As below in detailed report  Indications for Procedure: Melody Brooks presents to [facility], [place] with a chief complaint of draining ulceration at the lateral aspect of the right midfoot with concern for underlying osteomyelitis and pathologic avulsion type fracture of the fifth metatarsal base.  The patient has failed conservative treatments of various modalities. At this time the patient has elected to proceed with surgical correction. All alternatives, risks, and complications of the procedures were thoroughly explained to the  patient. Patient exhibits appropriate understanding of all discussion points and informed consent was signed and obtained in the chart with no guarantees to surgical outcome given or implied.  Description of Procedure: Patient was brought to the operating room. Patient remained on their hospital bed in the supine position. A surgical timeout was performed and all members of the operating room, the procedure, and the surgical site were identified. anesthesia occurred as per anesthesia record. Local anesthetic as previously described was then injected about the operative field in a local infiltrative block.  The operative lower extremity as noted above was then prepped and draped in the usual sterile manner. The following procedure then began.  Attention was directed to the right lateral midfoot.  A elliptical incision was carried out around the ulceration so as to excise it in its entirety and extend both distal and proximal to the wound.  Full-thickness incision was carried to bone of fifth metatarsal base.  The ulceration was excised.  A deep tissue culture was harvested and separately a wound tissue swab was performed.  Both of these were sent for micro.  There was evidence of fracture avulsion type of the fifth metatarsal base.  There was noted to be some purulence approximately 1 to 3 cc present at the styloid process area of the fifth metatarsal base.  This was tracking slightly proximally.  The periosteum of the fifth metatarsal base was then excised and the bone was exposed to approximately the midshaft area.  Then using a sagittal saw a transverse osteotomy was made through the mid shaft of the fifth metatarsal base.  The fifth metatarsal base was then resected in entirety along with the fractured fragment.  A bone culture was harvested off the fracture fragment  fifth metatarsal tuberosity.  The distal cut margin of the fifth metatarsal did appear to be healthy the bone and firm did not appear grossly  necrotic.  This was inked and sent for pathologic examination with the rest of the fifth metatarsal base and the styloid process that had fracture.  The wound was then further irrigated with rongeur and 15 blade with excision of any necrotic or fibrotic tissues.  The wound was then irrigated thoroughly with sterile saline via power pulse lavage 3 L.  There is no further purulence or tracking infection noted no evidence of residual bone infection.  The wound measured approximately 5 x 1 cm postdebridement.  Decision was then made to close the surgical site.  Complex closure was then performed with 2-0 Prolene and skin staples.  The skin was approximated with some tension however was closed nicely without any evidence of bleeding upon the tourniquet being let down.  The surgical site was then dressed with Xeroform 4 x 4 gauze Kerlix ABD pad Ace wrap. The patient tolerated both the procedure and anesthesia well with vital signs stable throughout. The patient was transferred in good condition and all vital signs stable  from the OR to recovery under the discretion of anesthesia.  Condition: Vital signs stable, neurovascular status unchanged from preoperative   Surgical plan:  Hopeful for clean margin at the distal cut margin of the fifth metatarsal base.  Await distal margin surgical pathology to be sure.  Recommend infectious disease consult patient has follow-up with Dr. Fayette recently for her spinal cord stimulator infection.  Continue IV antibiotics broad-spectrum for now and narrow as cultures allow.  Recommend nonweightbearing due to loss of the peroneus brevis tendon.  Patient is aware that she may need further surgery at some point in the future when infection is cleared if she begins to have varus deformity of her foot.  Also aware of risk of recurrent residual infection that would require further surgery.  No further surgery anticipated this admission however antibiotic route and duration will  depend on surgical pathology result.  The patient will be nonweightbearing in a postop shoe to the operative limb until further instructed. The dressing is to remain clean, dry, and intact. Will continue to follow unless noted elsewhere.   Marsa Honour, DPM [facility]

## 2024-08-17 NOTE — Consult Note (Addendum)
 WOC Nurse Consult Note: patient has had ongoing wound to R lateral foot followed by podiatry (last seen 07/12/2024) and infectious disease (last seen 9/302025); has been referred to wound care center, seen by them 08/16/2024 but notes are not available; per H&P wound care MD had concerns for worsening redness going up into leg and had concerns for infection to bone.   Patient also has back wound from prior I&D; per infectious disease note this is largely healed; would require only a silicone foam or Dry gauze and tape.   Reason for Consult: R diabetic foot wound  Wound type: R lateral foot r/t neuropathy  Pressure Injury POA: NA  Measurement: see nursing flowsheet; per last podiatry note 1.1  cm x 0.6 cm x 0.3 cm post debridement  Wound bed:60% tan 40% red  Drainage (amount, consistency, odor) see nursing flowsheet  Periwound: erythema and edema Dressing procedure/placement/frequency: Cleanse R lateral foot wound with Vashe wound cleanser, do not rinse and allow to air dry.  Using a Q tip applicator Apply 1/4 thick layer of Santyl  to wound bed, top with saline moistened gauze and dry gauze.  Secure with Kerlix roll gauze.    Podiatry has been consulted. X-ray with no evidence of osteomyelitis.    Any wound care orders placed by podiatry supercede wound care orders placed by [facility] RN.    WOC team will not follow Re-consult if further needs arise.   Thank you,    Powell Bar MSN, RN-BC, [employer]

## 2024-08-17 NOTE — Progress Notes (Signed)
 PROGRESS NOTE    Melody Brooks   FMW:[id number] DOB: 01-14-1956  DOA: 08/16/2024 Date of Service: 08/17/24 which is hospital day 1  PCP: Auston Reyes BIRCH, MD    Hospital course / significant events:   HPI: Melody Brooks is a 68 y.o. female with medical history significant of DM, HTN, HLD, dCHF, CKD-3b, angioedema, chronic pain, anemia, obesity, s/p spinal cord stimulator placement, who presents with right foot wound with concern for infection. She was recently hospitalized from 9/17 - 9/22 for spinal cord stimulator surgical site infection - superficial, I&D abscess 09/21, no concern for infection of the actual device. Cultures were obtained growing MRSA as of 9/21.  Pt was discharged on linezolid  for 1 week. Pt also had chronic right foot wound with infection for more than 4 months so was discharged on Levaquin  in addition to linezolid . Pt states that her spinal cord stimulator surgical site infection has healed, but her right foot pain has been worsening. Pt was seen at wound clinic 10/16 for right foot wound, concern for worsening infection. Her right food redness and swelling are spreading up to lower leg, concerning for osteomyelitis, and then sent to ED for further evaluation and treatment.   10/16: to ED. R foot XR (+)mildly displaced avulsion fracture of the base of the fifth metatarsal with nonunion. R foot CT (+)confirms 5th MT fx unchanged, soft tissue generalized edema, no focal fluid collection, no sq gas, no bone lesion  10/17: to OR w/ podiatry, I&D and complex closure.      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  Ulceration lateral midfoot right foot with necrosis of bone, osteomyelitis of fifth metatarsal base  Podiatry consult - to OR today I&D + bone resection 5th metatarsal  Await cultures    Essential hypertension IV hydralazine as needed Home Propranolol    Hyperlipemia Crestor    Chronic diastolic CHF (congestive heart failure) not in exacerbation 2D echo on 04/15/2022 showed EF> 55% with grade 1 diastolic dysfunction.   For now, hold Lasix  and spironolactone  due to worsening renal function I&O   Type 2 diabetes mellitus with peripheral neuropathy  Recent A1c 5.7, well-controlled.   Patient is taking glipizide , Janumet , Ozempic SSI here for strict glc control - hypoglycemia so dc SSI  Continue glc checks for now, can restart insulin  if hyperglycemic   Anemia, iron deficiency:  Hemoglobin stable 11.6 on admission --> 9.7 HD2 likely dilutional d/t IV fluids. Continue iron supplement Monitor CBC   Acute renal failure superimposed on stage 3b chronic kidney disease Patient baseline creatinine 1.46 on 07/23/2024.   Her creatinine is 1.85, BUN 25, GFR 29. IV fluid  dc once taking po  Monitor BMP Hold Lasix  and spironolactone  for now   Class 2 obesity based on BMI: Body mass index is 36.74 kg/m.[name] Significantly low or high BMI is associated with higher medical risk.  Underweight - under 18  overweight - 25 to 29 obese - 30  or more Class 1 obesity: BMI of 30.0 to 34 Class 2 obesity: BMI of 35.0 to 39 Class 3 obesity: BMI of 40.0 to 49 Super Morbid Obesity: BMI 50-59 Super-super Morbid Obesity: BMI 60+ Healthy nutrition and physical activity advised as adjunct to other disease management and risk reduction treatments Continue Ozempic outpatient     DVT prophylaxis: heparin  IV fluids: no continuous IV fluids  Nutrition:  cardiac/carb Central lines / other devices: none  Code Status: FULL CODE ACP documentation reviewed:  none on file in [facility]  TOC needs: TBD expet may need HH/DME Medical barriers to dispo: IV abx pending cultures . Expected medical readiness for discharge 2-3 days.              Subjective / Brief ROS:  Patient reports feeling good following surgery Denies CP/SOB.  Pain controlled.  Denies new weakness.  Tolerating diet.  Reports no concerns w/ urination/defecation.   Family Communication: husband and son are at bedside on rounds     Objective Findings:  Vitals:   08/17/24 1430 08/17/24 1445 08/17/24 1500 08/17/24 1517  BP: (!) 126/56 127/64 (!) 122/50 (!) 140/49  Pulse: 77 75 76 75  Resp: 17 17 20 20   Temp:   98.5 F (36.9 C) 98.6 F (37 C)  TempSrc:    Oral  SpO2: 98% 100% 98% 97%  Weight:      Height:        Intake/Output Summary (Last 24 hours) at 08/17/2024 1737 Last data filed at 08/17/2024 1418 Gross per 24 hour  Intake 1265.16 ml  Output 5 ml  Net 1260.16 ml   Filed Weights   08/16/24 1508 08/17/24 0500 08/17/24 1259  Weight: 108.9 kg 109.6 kg 110 kg    Examination:  Physical Exam Constitutional:      General: She is not in acute distress. Pulmonary:     Effort: Pulmonary effort is normal.     Breath sounds: Normal breath sounds.  Neurological:     Mental Status: She is alert and oriented to person, place, and time. Mental status is at baseline.  Psychiatric:        Mood and Affect: Mood normal.        Behavior: Behavior normal.          Scheduled Medications:   aspirin  EC  81 mg Oral Daily   collagenase    Topical Daily   ferrous sulfate   325 mg Oral QHS   heparin  injection (subcutaneous)  5,000 Units Subcutaneous Q8H   pregabalin   100 mg Oral BID   propranolol  ER  60 mg Oral Daily   rosuvastatin   10 mg Oral Daily    Continuous Infusions:  linezolid  (ZYVOX ) IV     piperacillin-tazobactam (ZOSYN)  IV      PRN  Medications:  acetaminophen , dextrose , hydrALAZINE, ondansetron  (ZOFRAN ) IV  Antimicrobials from admission:  Anti-infectives (From admission, onward)    Start     Dose/Rate Route Frequency Ordered Stop   08/17/24 2300  vancomycin  (VANCOREADY) IVPB 750 mg/150 mL  Status:  Discontinued        750 mg 150 mL/hr over 60 Minutes Intravenous Every 24 hours 08/16/24 2129 08/17/24 1016   08/17/24 2200  linezolid  (ZYVOX ) IVPB 600 mg        600 mg 300 mL/hr over 60 Minutes Intravenous Every 12 hours 08/17/24 1016     08/17/24 1400  piperacillin-tazobactam (ZOSYN) IVPB 3.375 g        3.375 g 12.5 mL/hr over  240 Minutes Intravenous Every 8 hours 08/17/24 1007     08/16/24 2230  vancomycin  (VANCOREADY) IVPB 1250 mg/250 mL        1,250 mg 166.7 mL/hr over 90 Minutes Intravenous  Once 08/16/24 2128 08/17/24 0054   08/16/24 2200  metroNIDAZOLE (FLAGYL) tablet 500 mg  Status:  Discontinued        500 mg Oral Every 12 hours 08/16/24 2113 08/17/24 1007   08/16/24 2130  cefTRIAXone  (ROCEPHIN ) 2 g in sodium chloride  0.9 % 100 mL IVPB  Status:  Discontinued        2 g 200 mL/hr over 30 Minutes Intravenous Every 24 hours 08/16/24 2112 08/17/24 1007   08/16/24 2045  vancomycin  (VANCOCIN ) IVPB 1000 mg/200 mL premix        1,000 mg 200 mL/hr over 60 Minutes Intravenous  Once 08/16/24 2038 08/16/24 2324           Data Reviewed:  I have personally reviewed the following...  CBC: Recent Labs  Lab 08/16/24 1523 08/17/24 0632  WBC 9.8 6.7  NEUTROABS 8.2*  --   HGB 11.6* 9.7*  HCT 36.1 29.3*  MCV 95.8 92.7  PLT 268 225   Basic Metabolic Panel: Recent Labs  Lab 08/16/24 1523 08/17/24 0632  NA 134* 134*  K 4.3 3.8  CL 100 106  CO2 21* 19*  GLUCOSE 191* 176*  BUN 25* 25*  CREATININE 1.85* 1.71*  CALCIUM  9.7 8.6*   GFR: Estimated Creatinine Clearance: 40.9 mL/min (A) (by C-G formula based on SCr of 1.71 mg/dL (H)). Liver Function Tests: Recent Labs  Lab 08/16/24 1523  AST 19  ALT 13   ALKPHOS 70  BILITOT 0.8  PROT 7.5  ALBUMIN 3.9   No results for input(s): LIPASE, AMYLASE in the last 168 hours. No results for input(s): AMMONIA in the last 168 hours. Coagulation Profile: Recent Labs  Lab 08/16/24 1523  INR 1.0   Cardiac Enzymes: No results for input(s): CKTOTAL, CKMB, CKMBINDEX, TROPONINI in the last 168 hours. BNP (last 3 results) No results for input(s): PROBNP in the last 8760 hours. HbA1C: No results for input(s): HGBA1C in the last 72 hours. CBG: Recent Labs  Lab 08/17/24 1223 08/17/24 1246 08/17/24 1250 08/17/24 1315 08/17/24 1418  GLUCAP 61* 27* 65* 76 108*   Lipid Profile: No results for input(s): CHOL, HDL, LDLCALC, TRIG, CHOLHDL, LDLDIRECT in the last 72 hours. Thyroid  Function Tests: No results for input(s): TSH, T4TOTAL, FREET4, T3FREE, THYROIDAB in the last 72 hours. Anemia Panel: No results for input(s): VITAMINB12, FOLATE, FERRITIN, TIBC, IRON, RETICCTPCT in the last 72 hours. Most Recent Urinalysis On File:     Component Value Date/Time   COLORURINE YELLOW (A) 08/16/2024 2204   APPEARANCEUR HAZY (A) 08/16/2024 2204   LABSPEC 1.016 08/16/2024 2204   PHURINE 5.0 08/16/2024 2204   GLUCOSEU 50 (A) 08/16/2024 2204   HGBUR NEGATIVE 08/16/2024 2204   BILIRUBINUR NEGATIVE 08/16/2024 2204   BILIRUBINUR neg 05/17/2019 1356   KETONESUR NEGATIVE 08/16/2024 2204   PROTEINUR NEGATIVE 08/16/2024 2204   UROBILINOGEN 0.2 05/17/2019 1356   NITRITE NEGATIVE 08/16/2024 2204   LEUKOCYTESUR TRACE (A) 08/16/2024 2204   Sepsis Labs: @LABRCNTIP (procalcitonin:4,lacticidven:4) Microbiology: Recent Results (from the past 240 hours)  Culture, blood (Routine x 2)     Status: None (Preliminary result)   Collection Time: 08/16/24  3:23 PM   Specimen: BLOOD  Result Value Ref Range Status   Specimen Description BLOOD BLOOD RIGHT ARM  Final   Special Requests  Final    BOTTLES DRAWN AEROBIC AND  ANAEROBIC Blood Culture results may not be optimal due to an inadequate volume of blood received in culture bottles   Culture   Final    NO GROWTH < 24 HOURS Performed at [facility], [address] [place], [place] [address]    Report Status PENDING  Incomplete  Culture, blood (Routine x 2)     Status: None (Preliminary result)   Collection Time: 08/16/24  6:28 PM   Specimen: BLOOD  Result Value Ref Range Status   Specimen Description BLOOD BLOOD LEFT ARM  Final   Special Requests   Final    BOTTLES DRAWN AEROBIC AND ANAEROBIC Blood Culture results may not be optimal due to an inadequate volume of blood received in culture bottles   Culture   Final    NO GROWTH < 12 HOURS Performed at [facility], [address] [place], [place] [address]    Report Status PENDING  Incomplete      Radiology Studies last 3 days: CT FOOT RIGHT WO CONTRAST Result Date: 08/16/2024 EXAM: CT RIGHT FOOT, WITHOUT IV CONTRAST 08/16/2024 10:25:54 PM TECHNIQUE: Axial images were acquired through the right foot without IV contrast. Reformatted images were reviewed. Automated exposure control, iterative reconstruction, and/or weight based adjustment of the mA/kV was utilized to reduce the radiation dose to as low as reasonably achievable. COMPARISON: Right foot series today, right foot series 07/21/2024. No prior cross-sectional imaging of the right foot for comparison. CLINICAL HISTORY: Foot swelling, diabetic, osteomyelitis suspected, xray done. FINDINGS: BONES AND JOINTS: There is a mildly displaced transverse 5th metatarsal base fracture, first seen on 07/21/2024, age indeterminate with no healing callus. There are no further fractures. . There is a shallow depression in the overlying skin adjacent to the fracture; this could represent a small skin wound, but there is no underlying bony destruction, although an early infection in the fracture bed is difficult to exclude. There is mild hallux  valgus with otherwise normal intraosseous alignment. There is mild midfoot arthrosis, early degenerative change in the toe joints, and mild nonerosive arthrosis in the 1st MTP joint, but no evidence of an erosive arthropathy, Charcot's arthropathy, or other significant focal bone abnormality or focal pathologic process. There are small noninflammatory-appearing dorsal and plantar calcaneal spurs. The ankle mortise is symmetric. No dislocation. The joint spaces are normal, except for the described arthrosis. SOFT TISSUES: There is moderate generalized edema in the ankle and foot. Partial atrophy in the plantar intrinsic foot musculature, which may be seen with diabetes. Area tendons are not well evaluated with CT, but they are grossly intact. There are no focal fluid collections either in the subcutaneous plane or deep spaces. No soft tissue gas is seen. Shallow depression in the overlying skin adjacent to the 5th metatarsal base fracture, as described in BONES AND JOINTS. IMPRESSION: 1. Mildly displaced transverse 5th metatarsal base fracture without healing callus, first seen on 07/21/2024 films and unchanged in appearance ; early fracture bed infection is difficult to exclude, but there is no destructive bone lesion . 2. Moderate generalized edema in the ankle and foot without focal fluid collection or soft tissue gas. 3. Partial atrophy of the plantar intrinsic foot musculature, which may be seen with diabetes. Electronically signed by: Francis Quam MD 08/16/2024 10:52 PM EDT RP Workstation: [id number]   DG Foot Complete Right Result Date: 08/16/2024 CLINICAL DATA:  Wound infection. EXAM: RIGHT FOOT COMPLETE - 3+ VIEW COMPARISON:  Radiograph dated  07/21/2024. FINDINGS: Mildly displaced avulsion fracture of the base of the fifth metatarsal with nonunion. No new fracture. No dislocation. Focal skin irregularity over the lateral foot likely related to dressing. Clinical correlation is recommended. No soft  tissue gas. IMPRESSION: Mildly displaced avulsion fracture of the base of the fifth metatarsal with nonunion. Electronically Signed   By: Vanetta Chou M.D.   On: 08/16/2024 16:37         Shalandria Elsbernd, DO [facility] 08/17/2024, 5:37 PM    Dictation software may have been used to generate the above note. Typos may occur and escape review in typed/dictated notes. Please contact Dr Marsa directly for clarity if needed.  Staff may message me via secure chat in Epic  but this may not receive an immediate response,  please page me for urgent matters!  If 7PM-7AM, please contact night coverage [email or web address]

## 2024-08-17 NOTE — Transfer of Care (Signed)
 Immediate Anesthesia Transfer of Care Note  Patient: Melody Brooks  Procedure(s) Performed: IRRIGATION AND DEBRIDEMENT FOOT (Right: Foot) AMPUTATION, PARTIAL FIFTH METATARSAL (Right: Foot)  Patient Location: PACU  Anesthesia Type:General  Level of Consciousness: sedated  Airway & Oxygen Therapy: Patient Spontanous Breathing and Patient connected to face mask oxygen  Post-op Assessment: Report given to RN and Post -op Vital signs reviewed and stable  Post vital signs: Reviewed and stable  Last Vitals:  Vitals Value Taken Time  BP 125/59 08/17/24 14:15  Temp    Pulse 75 08/17/24 14:18  Resp 14 08/17/24 14:18  SpO2 99 % 08/17/24 14:18  Vitals shown include unfiled device data.  Last Pain:  Vitals:   08/17/24 1259  TempSrc: Temporal  PainSc: 0-No pain         Complications: No notable events documented.

## 2024-08-17 NOTE — Progress Notes (Signed)
 IV team to bedside to obtain vascular access. I was able to place a 22g to her R upper arm, but did not see any other veins that were suitable for peripheral access either manually or with ultrasound. I would recommend central access for any additional vascular access needs.

## 2024-08-17 NOTE — Anesthesia Preprocedure Evaluation (Signed)
 Anesthesia Evaluation  Patient identified by MRN, date of birth, ID band Patient awake    Reviewed: Allergy & Precautions, NPO status , Patient's Chart, lab work & pertinent test results  Airway Mallampati: III  TM Distance: >3 FB Neck ROM: full    Dental  (+) Missing   Pulmonary neg pulmonary ROS   Pulmonary exam normal        Cardiovascular hypertension, negative cardio ROS Normal cardiovascular exam     Neuro/Psych  Headaches  Neuromuscular disease  negative psych ROS   GI/Hepatic Neg liver ROS, hiatal hernia,GERD  ,,  Endo/Other  negative endocrine ROSdiabetes, Well Controlled, Type 2, Oral Hypoglycemic Agents    Renal/GU ARF and CRFRenal disease     Musculoskeletal   Abdominal   Peds  Hematology  (+) Blood dyscrasia, anemia   Anesthesia Other Findings Past Medical History: No date: Anemia No date: Aneurysm (HCC)     Comment:  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 pt stated              on around October of 2022 No date: Angioedema 07/08/2015: Arterial fibromuscular dysplasia (HCC) No date: Arthritis No date: Cancer (HCC) 12/16/2017: Carotid stenosis No date: Chronic kidney disease 03/20/2024: Chronic pain syndrome 09/10/2019: Chronic urticaria No date: Constipation 07/17/2003: Diabetes mellitus with polyneuropathy (HCC) No date: Fatigue No date: Fibromuscular dysplasia (HCC) No date: Generalized headaches No date: GERD (gastroesophageal reflux disease) No date: Hemorrhoids No date: Hiatal hernia No date: Hypertension 07/30/2002: Idiopathic scoliosis 03/20/2024: Lumbar facet arthropathy 12/11/2014: Lumbosacral radiculitis No date: Neuropathy 06/13/2024: Nose colonized with MRSA     Comment:  a.) presurgical PCR (+) 06/13/2024 prior to THORACIC               LAMINECTOMY FOR SCS IMPLANTATION No date: Palpitations No date: Pneumonia No date: Rectal bleeding No date: Rectal  pain 03/20/2024: Spinal stenosis of lumbar region without neurogenic  claudication  Past Surgical History: No date: Basal cell cancer  removal 2010: colonoscopy 12/23/2022: EXCISION/RELEASE BURSA HIP; Right     Comment:  Procedure: OPEN EXCISION OF RIGHT TROCHANTERIC BURSA;                Surgeon: Edie Norleen PARAS, MD;  Location: [facility] ORS;                Service: Orthopedics;  Laterality: Right; 03/2012: EXCISIONAL HEMORRHOIDECTOMY 09/04/2021: IR ANGIO EXTERNAL CAROTID SEL EXT CAROTID BILAT MOD SED 09/04/2021: IR ANGIO INTRA EXTRACRAN SEL INTERNAL CAROTID BILAT MOD SED 09/04/2021: IR ANGIO VERTEBRAL SEL SUBCLAVIAN INNOMINATE UNI L MOD SED 09/04/2021: IR ANGIO VERTEBRAL SEL VERTEBRAL UNI R MOD SED 09/04/2021: IR US  GUIDE VASC ACCESS RIGHT 2006, 2008: right and left eye cataract surgery     Comment:  right in 2006 and left 2008 06/22/2024: THORACIC LAMINECTOMY FOR SPINAL CORD STIMULATOR; N/A     Comment:  Procedure: THORACIC LAMINECTOMY FOR SPINAL CORD               STIMULATOR;  Surgeon: Clois Fret, MD;  Location:               [facility] ORS;  Service: Neurosurgery;  Laterality: N/A;                THORACIC LAMINECTOMY FOR SPINAL CORD STIMULATOR PLACEMENT 1995: TUBAL LIGATION  BMI    Body Mass Index: 36.49 kg/m      Reproductive/Obstetrics negative OB ROS  Anesthesia Physical Anesthesia Plan  ASA: 3  Anesthesia Plan: General   Post-op Pain Management: Ofirmev  IV (intra-op)* and Dilaudid  IV   Induction: Intravenous  PONV Risk Score and Plan: 3 and Ondansetron , Dexamethasone , Midazolam  and Treatment may vary due to age or medical condition  Airway Management Planned: Oral ETT and LMA  Additional Equipment:   Intra-op Plan:   Post-operative Plan: Extubation in OR  Informed Consent: I have reviewed the patients History and Physical, chart, labs and discussed the procedure including the risks, benefits and alternatives for  the proposed anesthesia with the patient or authorized representative who has indicated his/her understanding and acceptance.     Dental Advisory Given  Plan Discussed with: Anesthesiologist, CRNA and Surgeon  Anesthesia Plan Comments: (Patient consented for risks of anesthesia including but not limited to:  - adverse reactions to medications - damage to eyes, teeth, lips or other oral mucosa - nerve damage due to positioning  - sore throat or hoarseness - Damage to heart, brain, nerves, lungs, other parts of body or loss of life  Patient voiced understanding and assent.)         Anesthesia Quick Evaluation

## 2024-08-17 NOTE — Inpatient Diabetes Management (Signed)
 Inpatient Diabetes Program Recommendations  AACE/ADA: New Consensus Statement on Inpatient Glycemic Control (2015)  Target Ranges:  Prepandial:   less than 140 mg/dL      Peak postprandial:   less than 180 mg/dL (1-2 hours)      Critically ill patients:  140 - 180 mg/dL    Latest Reference Range & Units 08/16/24 22:41 08/17/24 08:31 08/17/24 12:23  Glucose-Capillary 70 - 99 mg/dL 889 (H) 866 (H)  1 unit Novolog   61 (L)  (H): Data is abnormally high (L): Data is abnormally low   Admit with: Right foot wound with infection   History: DM, CKD  Home DM Meds: Glipizide  2.5 mg daily        Janumet  50/1000 mg daily        Farxiga 5 mg daily        Ozempic weekly        Actos  45 mg at HS  Current Orders: Novolog  Sensitive Correction Scale/ SSI (0-9 units) TID AC + HS   MD- Note Hypoglycemia at 12pm today after getting 1 unit Novolog  SSi at 8am  Please consider reducing the Novolog  SSI to the 0-6 unit Very Sensitive scale     --Will follow patient during hospitalization--  Adina Rudolpho Arrow RN, MSN, CDCES Diabetes Coordinator Inpatient Glycemic Control Team Team Pager: [phone] (8a-5p)

## 2024-08-17 NOTE — Progress Notes (Signed)
 OT Cancellation Note  Patient Details Name: Melody Brooks MRN: [id number] DOB: [birth year]   Cancelled Treatment:    Reason Eval/Treat Not Completed: Patient at procedure or test/ unavailable. Order received, chart reviewed. Pt off unit for right fifth metatarsal base resection with I&D. Will hold and await new orders as pt appropriate to initiate services.   Elston Slot, M.S. OTR/L  08/17/24, 1:16 PM  ascom [phone]

## 2024-08-17 NOTE — Plan of Care (Signed)

## 2024-08-17 NOTE — Hospital Course (Addendum)
 Hospital course / significant events:   HPI: Melody Brooks is a 68 y.o. female with medical history significant of DM, HTN, HLD, dCHF, CKD-3b, angioedema, chronic pain, anemia, obesity, s/p spinal cord stimulator placement, who presents with right foot wound with concern for infection. She was recently hospitalized from 9/17 - 9/22 for spinal cord stimulator surgical site infection - superficial, I&D abscess 09/21, no concern for infection of the actual device. Cultures were obtained growing MRSA as of 9/21.  Pt was discharged on linezolid  for 1 week. Pt also had chronic right foot wound with infection for more than 4 months so was discharged on Levaquin  in addition to linezolid . Pt states that her spinal cord stimulator surgical site infection has healed, but her right foot pain has been worsening. Pt was seen at wound clinic 10/16 for right foot wound, concern for worsening infection. Her right food redness and swelling are spreading up to lower leg, concerning for osteomyelitis, and then sent to ED for further evaluation and treatment.   10/16: to ED. R foot XR (+)mildly displaced avulsion fracture of the base of the fifth metatarsal with nonunion. R foot CT (+)confirms 5th MT fx unchanged, soft tissue generalized edema, no focal fluid collection, no sq gas, no bone lesion  10/17: to OR w/ podiatry, I&D and complex closure.  10/18: cultures no concerns thus far (on specimens from R foot / bone: rare GPC, few WBC), pathology pending. Cellulitis is improving, continue abx. PT/OT recs for SNF, TOC consult order placed.  10/19: await cultures and placement 10/20: (+)MRSA, narrow abx to linezolid . If good margins on bone path will just need po abx      Consultants:  Podiatry   Procedures/Surgeries: 08/17/24 Incision and drainage with fifth metatarsal base resection, right foot; Complex closure of wound right lateral midfoot approximately 5 x 1 cm - Dr Malvin       ASSESSMENT & PLAN:    Diabetic foot infection on right foot with wound (+)MRSA Cellulitis RLE - improving  NO sepsis  Ruled out DVT  (+)MRSA, narrow abx to linezolid . There were clear margins on bone path - will just need po abx  Podiatry consult - to OR 10/17 I&D Once patient is discharged to rehab facility recommend dressing changes every other day: Paint incision with Betadine, Xeroform, ABD, large Kerlix, Ace wrap. Podiatry has signed off. F/u in office 3 weeks post discharge for suture and staple removal.   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   Restart farxiga, Lasix  and spironolactone  which were held d/t AKI  I&O   Type 2 diabetes mellitus with peripheral neuropathy  Recent A1c 5.7, well-controlled.   Patient is taking glipizide , Janumet , Ozempic SSI here for strict glc control - hypoglycemia so dc SSI  Continue glc checks for now, can restart insulin  if hyperglycemic   Anemia, iron deficiency:  Hemoglobin stable 11.6 on admission --> 9.7 HD2 likely dilutional d/t IV fluids. Continue iron supplement Monitor CBC   Acute renal failure  superimposed on stage 3b chronic kidney disease - resolved/improved Patient baseline creatinine 1.46 on 07/23/2024.   Now at basline Monitor BMP resume Lasix  and spironolactone     Class 2 obesity based on BMI: Body mass index is 36.74 kg/m.[name] Significantly low or high BMI is associated with higher medical risk.  Underweight - under 18   overweight - 25 to 29 obese - 30 or more Class 1 obesity: BMI of 30.0 to 34 Class 2 obesity: BMI of 35.0 to 39 Class 3 obesity: BMI of 40.0 to 49 Super Morbid Obesity: BMI 50-59 Super-super Morbid Obesity: BMI 60+ Healthy nutrition and physical activity advised as adjunct to other disease management and risk reduction treatments Continue Ozempic outpatient     DVT prophylaxis: heparin  IV fluids: no continuous IV fluids  Nutrition: cardiac/carb Central lines / other devices: none  Code Status: FULL CODE ACP documentation reviewed:  none on file in [facility]  [facility] needs: SNF rehab Medical barriers to dispo: IV abx  while here but medically stable

## 2024-08-17 NOTE — Consult Note (Signed)
 PODIATRY CONSULTATION  NAME Melody Brooks MRN [id number] DOB May 13, 1956 DOA 08/16/2024   Reason for consult:  Chief Complaint  Patient presents with   Wound Infection    Attending/Consulting physician: N. Maysun Meditz DO  History of present illness:  Melody Brooks is a 68 y.o. female with medical history significant of DM, HTN, HLD, dCHF, CKD-3b, angioedema, chronic pain, anemia, obesity, s/p spinal cord stimulator placement, who presents with right foot wound with infection.   Patient was recently hospitalized from 9/17 - 9/22 due to spinal cord stimulator surgical site infection. Cultures were obtained growing MRSA as of 9/21. S/p ID, it appeared she had a superficial abscess that did not involved the pocket. Pt was discharged on linezolid  for 1 week. Pt also had chronic right foot wound with infection for more than 4 months.  Patient was discharged on Levaquin  in addition to linezolid . Pt states that her spinal cord stimulator surgical site infection has healed, but her right foot pain has been worsening.    Pt was seen at wound clinic today for right foot wound. and found to have worsening infection. Her right food redness and swelling are spreading up to lower leg, concerning for osteomyelitis, and then sent to ED for further evaluation and treatment.  Pt was given linezolid  prescription.  Patient took 1 pill before coming to ED. Patient has moderate pain in the right foot, which is constant, aching, nonradiating, aggravated by walking.  No fever or chills.  Denies chest pain, cough, SOB.  No nausea, vomiting, diarrhea or abdominal pain.  No symptoms of UTI.  Patient is unsure how the wound started.  She does report neuropathy in her right foot.  Has seen Dr. Tobie for this in the past.  She denies any trauma to the right foot that could have caused fracture.  Has been going to wound care clinic at least once previously was concern for worsening infection cellulitis and possible  osteomyelitis.  Was sent to the hospital.  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 pt stated on around October of 2022   Angioedema    Arterial fibromuscular dysplasia 07/08/2015   Arthritis    Cancer (HCC)    Carotid stenosis 12/16/2017   Chronic kidney disease    Chronic pain syndrome 03/20/2024   Chronic urticaria 09/10/2019   Constipation    Diabetes mellitus with polyneuropathy (HCC) 07/17/2003   Fatigue    Fibromuscular dysplasia    Generalized headaches    GERD (gastroesophageal reflux disease)    Hemorrhoids    Hiatal hernia    Hypertension    Idiopathic scoliosis 07/30/2002   Lumbar facet arthropathy 03/20/2024   Lumbosacral radiculitis 12/11/2014   Neuropathy    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   a.) presurgical PCR (+) 06/13/2024 prior to THORACIC LAMINECTOMY FOR SCS IMPLANTATION   Palpitations    Pneumonia    Rectal bleeding    Rectal pain    Spinal stenosis 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 03/20/2024       Latest Ref Rng & Units 08/17/2024    6:32 AM 08/16/2024    3:23 PM 07/23/2024    2:40 AM  CBC  WBC 4.0 - 10.5 K/uL 6.7  9.8  7.2   Hemoglobin 12.0 - 15.0 g/dL 9.7  88.3  88.9   Hematocrit 36.0 - 46.0 % 29.3  36.1  32.9   Platelets 150 -  400 K/uL 225  268  229        Latest Ref Rng & Units 08/17/2024    6:32 AM 08/16/2024    3:23 PM 07/23/2024    2:40 AM  BMP  Glucose 70 - 99 mg/dL 823  808  799   BUN 8 - 23 mg/dL 25  25  28    Creatinine 0.44 - 1.00 mg/dL 8.28  8.14  8.53   Sodium 135 - 145 mmol/L 134  134  140   Potassium 3.5 - 5.1 mmol/L 3.8  4.3  4.4   Chloride 98 - 111 mmol/L 106  100  106   CO2 22 - 32 mmol/L 19  21  19    Calcium  8.9 - 10.3 mg/dL 8.6  9.7  9.4       Physical Exam: Lower Extremity Exam  Right foot strong DP and PT pulse  Significant erythema and edema of the foot erythema extends up into the leg.  Circular ulceration with  fibronecrotic wound bed at the lateral aspect fifth metatarsal base.  Sensation is significantly diminished to right foot        ASSESSMENT/PLAN OF CARE 68 y.o. female with PMHx significant for   DM, HTN, HLD, dCHF, CKD-3b, angioedema, chronic pain, anemia, obesity, s/p spinal cord stimulator placement  with ulceration right lateral midfoot with concern for underlying osteomyelitis and pathologic fracture of the fifth metatarsal base.  WBC 6.7 CRP pending ESR 63  CT right foot without contrast: 1. Mildly displaced transverse 5th metatarsal base fracture without healing callus, first seen on 07/21/2024 films and unchanged in appearance ; early fracture bed infection is difficult to exclude, but there is no destructive bone lesion . 2. Moderate generalized edema in the ankle and foot without focal fluid collection or soft tissue gas.  -N.p.o. for OR today for right fifth metatarsal base resection with I&D, possible antibiotic beads possible wound VAC versus closure.  She agrees to proceed. -She is at high risk for progressive varus deformity of the foot given loss of the peroneus brevis tendon insertion in the future.  She was advised that this may happen and may require additional surgery.  At risk for nonhealing and further infection resulting in limb loss. - Continue IV abx broad spectrum pending further culture data - Anticoagulation: Heparin  subcutaneous has been given unfortunately.  I discontinued this.  Resume 24 hours postoperatively - Wound care: Leave surgical dressing clean dry intact - WB status: Will be nonweightbearing to the right foot and postop shoe - Will continue to follow   Thank you for the consult.  Please contact me directly with any questions or concerns.           Marolyn JULIANNA Honour, DPM [facility] / [facility]    [address] [place], [place] [address]                Office [phone]  Fax [phone]

## 2024-08-17 NOTE — Telephone Encounter (Signed)
 Patient's spouse called to let our office know that the patient is in the hospital for an infection in her right foot. He states that the infection is not in her back this time. He wanted Dr. Clois to be aware.

## 2024-08-17 NOTE — Progress Notes (Signed)
 CBG obtained by Mliss Grimes with a result of 27. A second CBG obtained due to the first test being done on a finger that was cold and pale. Second result of 65. MD Dario notified and verbal orders given. See MAR for medication administration. Patient denies neuropathy or any circulation issues in right middle finger. Finger appears white and cold to touch. The rest of her fingers are pink and warm.

## 2024-08-17 NOTE — Progress Notes (Signed)
 Patient being picked up to be transferred to OR for scheduled surgery. Nurse Assistant checked BG with a result of 61. This care RN sent message to Dr. Lenon to please add Dextrose  50 % solution ampule so that it could be given at OR as PRN medication was not available at this time. Transport indicated that BG can be re-checked at OR. Patient asymptomatic at this time.

## 2024-08-18 ENCOUNTER — Encounter: Payer: Self-pay | Admitting: Podiatry

## 2024-08-18 DIAGNOSIS — L089 Local infection of the skin and subcutaneous tissue, unspecified: Secondary | ICD-10-CM | POA: Diagnosis not present

## 2024-08-18 DIAGNOSIS — E11628 Type 2 diabetes mellitus with other skin complications: Secondary | ICD-10-CM | POA: Diagnosis not present

## 2024-08-18 LAB — GLUCOSE, CAPILLARY
Glucose-Capillary: 126 mg/dL — ABNORMAL HIGH (ref 70–99)
Glucose-Capillary: 190 mg/dL — ABNORMAL HIGH (ref 70–99)

## 2024-08-18 NOTE — Evaluation (Signed)
 Occupational Therapy Evaluation Patient Details Name: LILIAN FUHS MRN: [id number] DOB: 1956/09/11 Today's Date: 08/18/2024   History of Present Illness   AYELET GRUENEWALD is a 68 y.o. female with medical history significant of DM, HTN, HLD, dCHF, CKD-3b, angioedema, chronic pain, anemia, obesity, s/p spinal cord stimulator placement, who presents with right foot wound with infection. Now s/p I&D R foot 10/17.     Clinical Impressions Patient received for OT evaluation. See flowsheet below for details of function. Generally, patient requiring MIN A for bed mobility, MOD A for functional transfer to recliner, and MIN-MAX A for ADLs. Patient will benefit from continued OT while in acute care.      If plan is discharge home, recommend the following:   A lot of help with walking and/or transfers;A lot of help with bathing/dressing/bathroom;Assistance with cooking/housework;Assist for transportation;Help with stairs or ramp for entrance     Functional Status Assessment   Patient has had a recent decline in their functional status and demonstrates the ability to make significant improvements in function in a reasonable and predictable amount of time.     Equipment Recommendations   Wheelchair (measurements OT)     Recommendations for Other Services         Precautions/Restrictions   Restrictions [employer] Bearing Restrictions Per Provider Order: Yes RLE Weight Bearing Per Provider Order: Non weight bearing Other Position/Activity Restrictions: post-op shoe on R.     Mobility Bed Mobility Overal bed mobility: Needs Assistance Bed Mobility: Supine to Sit     Supine to sit: Min assist     General bed mobility comments: One loss of balance posteriorly while trying to transfer to EOB; pt able to recover with MIN A.    Transfers Overall transfer level: Needs assistance Equipment used: Rolling walker (2 wheels) Transfers: Sit to/from Stand Sit to Stand: Mod  assist, From elevated surface           General transfer comment: Difficulty maintaining NWB RLE status      Balance                                           ADL either performed or assessed with clinical judgement   ADL Overall ADL's : Needs assistance/impaired Eating/Feeding: Set up   Grooming: Set up;Oral care;Sitting Grooming Details (indicate cue type and reason): oral care in recliner at end of session           Upper Body Dressing Details (indicate cue type and reason): anticipate set up from seated Lower Body Dressing: Minimal assistance Lower Body Dressing Details (indicate cue type and reason): pt putting on regular sock and shoe on L foot; needed help with adjusting laces (unable to get two hands down to shoe) Toilet Transfer: Moderate assistance (simulated (transfer to recliner))     Toileting - Clothing Manipulation Details (indicate cue type and reason): anticipate MAX A while pt standing at RW; needs BIL UE to maintain standing balance at RW and NWB in RLE.   Tub/Shower Transfer Details (indicate cue type and reason): Not yet safe at this time Functional mobility during ADLs: Minimal assistance;Rolling walker (2 wheels) General ADL Comments: Pt with difficulty maintaining NWB RLE while in standing and transferring, although she was trying and aware.     Vision Baseline Vision/History: 0 No visual deficits Ability to See in Adequate Light: 0 Adequate Patient Visual Report:  No change from baseline       Perception Perception: Within Functional Limits       Praxis Praxis: Not tested       Pertinent Vitals/Pain Pain Assessment Pain Assessment: 0-10 Pain Score: 5  Pain Location: R foot Pain Descriptors / Indicators: Aching Pain Intervention(s): Limited activity within patient's tolerance     Extremity/Trunk Assessment Upper Extremity Assessment Upper Extremity Assessment: Generalized weakness   Lower Extremity  Assessment Lower Extremity Assessment: Defer to PT evaluation;RLE deficits/detail RLE Deficits / Details: NWB with post op shoe   Cervical / Trunk Assessment Cervical / Trunk Assessment: Normal   Communication Communication Communication: No apparent difficulties   Cognition Arousal: Alert Behavior During Therapy: WFL for tasks assessed/performed Cognition: No apparent impairments             OT - Cognition Comments: Ox4 and very pleasant.                 Following commands: Intact       Cueing  General Comments   Cueing Techniques: Verbal cues  Transferred to recliner; MIN A balance and max cues. Unable to take any steps forward.   Exercises     Shoulder Instructions      Home Living Family/patient expects to be discharged to:: Private residence Living Arrangements: Spouse/significant other Available Help at Discharge: Family;Available 24 hours/day Type of Home: House Home Access: Stairs to enter [employer] of Steps: 2 Entrance Stairs-Rails: Can reach both Home Layout: Two level;Able to live on main level with bedroom/bathroom Alternate Level Stairs-Number of Steps: flight   Bathroom Shower/Tub: Arts development officer Toilet: Handicapped height     Home Equipment: Agricultural consultant (2 wheels);Rollator (4 wheels);BSC/3in1;Shower seat (pt has ordered the shower seat)          Prior Functioning/Environment Prior Level of Function : Needs assist       Physical Assist :  (IADLs)     Mobility Comments: Pt using rollator the past few weeks 2/2 R foot wound. Denies falls ADLs Comments: IADL assist from husband the past few weeks (spinal cord stimulator in August and subsequent infection in Sept with hospitalization); (I) ADLs prior.    OT Problem List:     OT Treatment/Interventions: Self-care/ADL training;Therapeutic exercise;Therapeutic activities;Patient/family education      OT Goals(Current goals can be found in the care plan  section)   Acute Rehab OT Goals Patient Stated Goal: Get better OT Goal Formulation: With patient Time For Goal Achievement: 09/01/24 Potential to Achieve Goals: Good ADL Goals Pt Will Perform Lower Body Dressing: with modified independence;with adaptive equipment;sit to/from stand Pt Will Transfer to Toilet: bedside commode;with modified independence Pt Will Perform Toileting - Clothing Manipulation and hygiene: sit to/from stand;with modified independence   OT Frequency:  Min 2X/week    Co-evaluation              AM-PAC OT 6 Clicks Daily Activity     Outcome Measure Help from another person eating meals?: None Help from another person taking care of personal grooming?: A Little Help from another person toileting, which includes using toliet, bedpan, or urinal?: A Lot Help from another person bathing (including washing, rinsing, drying)?: A Lot Help from another person to put on and taking off regular upper body clothing?: None Help from another person to put on and taking off regular lower body clothing?: A Lot 6 Click Score: 17   End of Session Equipment Utilized During Treatment: Gait belt;Rolling walker (  2 wheels) Nurse Communication: Mobility status  Activity Tolerance: Patient tolerated treatment well;Patient limited by fatigue Patient left: in chair;with call bell/phone within reach (nursing notified of patient status)  OT Visit Diagnosis: Unsteadiness on feet (R26.81);Muscle weakness (generalized) (M62.81)                Time: 9099-9055 OT Time Calculation (min): 44 min Charges:  OT General Charges $OT Visit: 1 Visit OT Evaluation $OT Eval Moderate Complexity: 1 Mod OT Treatments $Self Care/Home Management : 8-22 mins $Therapeutic Activity: 8-22 mins  Jasmine Arlean Shams, MS, OTR/L  Jasmine Shams 08/18/2024, 10:00 AM

## 2024-08-18 NOTE — Evaluation (Signed)
 Physical Therapy Evaluation Patient Details Name: Melody Brooks MRN: [id number] DOB: July 27, 1956 Today's Date: 08/18/2024  History of Present Illness  ARRIANA LOHMANN is a 68 y.o. female with medical history significant of DM, HTN, HLD, dCHF, CKD-3b, angioedema, chronic pain, anemia, obesity, s/p spinal cord stimulator placement, who presents with right foot wound with infection. Now s/p I&D R foot 10/17.  Clinical Impression  Pt is a pleasant 68 year old female who was admitted for diabetic foot injry. Pt performs  transfers with min assist with session focused on improving transfers safely. Pt is currently not safe/able to ambulate due to [facility] restrictions. Pt demonstrates deficits with strength/mobility/balance. Reviewed and practiced several seated HEP exercises including LAQ, hip abd/add, and chair push ups. Pt is motivated to improve baseline. Would benefit from skilled PT to address above deficits and promote optimal return to PLOF. Pt will continue to receive skilled PT services while admitted and will defer to TOC/care team for updates regarding disposition planning. Will benefit optimally from moderate intensity post-acute PT services (<3 hours/day) and consistent staff assist for ADLs/mobility.     If plan is discharge home, recommend the following: A little help with walking and/or transfers;A little help with bathing/dressing/bathroom;Assist for transportation;Help with stairs or ramp for entrance   Can travel by private vehicle   No    Equipment Recommendations BSC/3in1  Recommendations for Other Services       Functional Status Assessment Patient has had a recent decline in their functional status and demonstrates the ability to make significant improvements in function in a reasonable and predictable amount of time.     Precautions / Restrictions Precautions Precautions: Fall Recall of Precautions/Restrictions: Intact Restrictions Weight Bearing Restrictions Per  Provider Order: Yes RLE Weight Bearing Per Provider Order: Non weight bearing Other Position/Activity Restrictions: post-op shoe on R.      Mobility  Bed Mobility               General bed mobility comments: complete session performed from recliner    Transfers Overall transfer level: Needs assistance Equipment used: Rolling walker (2 wheels) Transfers: Sit to/from Stand Sit to Stand: Min assist           General transfer comment: able to maintain NWB status. Able to performed repeated reps with decreasing physical assist required. Cues for sequencing and hand placement    Ambulation/Gait               General Gait Details: not safe to perform at this time due to weakness  Stairs            Wheelchair Mobility     Tilt Bed    Modified Rankin (Stroke Patients Only)       Balance Overall balance assessment: Needs assistance Sitting-balance support: Feet supported Sitting balance-Leahy Scale: Good     Standing balance support: Bilateral upper extremity supported Standing balance-Leahy Scale: Fair                               Pertinent Vitals/Pain Pain Assessment Pain Assessment: 0-10 Pain Score: 5  Pain Location: R foot Pain Descriptors / Indicators: Aching Pain Intervention(s): Limited activity within patient's tolerance    Home Living Family/patient expects to be discharged to:: Private residence Living Arrangements: Spouse/significant other Available Help at Discharge: Family;Available 24 hours/day Type of Home: House Home Access: Stairs to enter Entrance Stairs-Rails: Can reach both Entrance Stairs-Number of Steps: 2  Alternate Level Stairs-Number of Steps: flight Home Layout: Two level;Able to live on main level with bedroom/bathroom Home Equipment: Rolling Walker (2 wheels);Rollator (4 wheels);BSC/3in1;Shower seat Additional Comments: previous equipment from past surgeries    Prior Function Prior Level of  Function : Needs assist             Mobility Comments: Pt using rollator the past few weeks 2/2 R foot wound. Denies falls ADLs Comments: IADL assist from husband the past few weeks (spinal cord stimulator in August and subsequent infection in Sept with hospitalization); (I) ADLs prior.     Extremity/Trunk Assessment   Upper Extremity Assessment Upper Extremity Assessment: Generalized weakness    Lower Extremity Assessment Lower Extremity Assessment: Generalized weakness RLE Deficits / Details: B LE grossly 3+/5       Communication   Communication Communication: No apparent difficulties    Cognition Arousal: Alert Behavior During Therapy: WFL for tasks assessed/performed   PT - Cognitive impairments: No apparent impairments                       PT - Cognition Comments: pleasant and agreeable to session Following commands: Intact       Cueing Cueing Techniques: Verbal cues     General Comments      Exercises     Assessment/Plan    PT Assessment Patient needs continued PT services  PT Problem List Decreased strength;Decreased activity tolerance;Decreased balance;Decreased mobility;Decreased knowledge of use of DME;Decreased safety awareness;Pain       PT Treatment Interventions DME instruction;Gait training;Therapeutic activities;Therapeutic exercise;Balance training    PT Goals (Current goals can be found in the Care Plan section)  Acute Rehab PT Goals Patient Stated Goal: to go home if possible PT Goal Formulation: With patient Time For Goal Achievement: 09/01/24 Potential to Achieve Goals: Good    Frequency Min 2X/week     Co-evaluation               AM-PAC PT 6 Clicks Mobility  Outcome Measure Help needed turning from your back to your side while in a flat bed without using bedrails?: A Lot Help needed moving from lying on your back to sitting on the side of a flat bed without using bedrails?: A Little Help needed moving to  and from a bed to a chair (including a wheelchair)?: A Lot Help needed standing up from a chair using your arms (e.g., wheelchair or bedside chair)?: A Little Help needed to walk in hospital room?: A Lot Help needed climbing 3-5 steps with a railing? : A Lot 6 Click Score: 14    End of Session Equipment Utilized During Treatment: Gait belt Activity Tolerance: Patient tolerated treatment well Patient left: in chair;with chair alarm set Nurse Communication: Mobility status PT Visit Diagnosis: Muscle weakness (generalized) (M62.81);Difficulty in walking, not elsewhere classified (R26.2);Unsteadiness on feet (R26.81)    Time: 8852-8796 PT Time Calculation (min) (ACUTE ONLY): 16 min   Charges:   PT Evaluation $PT Eval Low Complexity: 1 Low   PT General Charges $$ ACUTE PT VISIT: 1 Visit         Corean Dade, PT, DPT, [facility] [phone]   Laylani Pudwill 08/18/2024, 1:42 PM

## 2024-08-18 NOTE — Progress Notes (Signed)
 PROGRESS NOTE    JAKYLAH Brooks   FMW:[id number] DOB: 10/19/1956  DOA: 08/16/2024 Date of Service: 08/18/24 which is hospital day 2  PCP: Auston Reyes BIRCH, MD    Hospital course / significant events:   HPI: Melody Brooks is a 68 y.o. female with medical history significant of DM, HTN, HLD, dCHF, CKD-3b, angioedema, chronic pain, anemia, obesity, s/p spinal cord stimulator placement, who presents with right foot wound with concern for infection. She was recently hospitalized from 9/17 - 9/22 for spinal cord stimulator surgical site infection - superficial, I&D abscess 09/21, no concern for infection of the actual device. Cultures were obtained growing MRSA as of 9/21.  Pt was discharged on linezolid  for 1 week. Pt also had chronic right foot wound with infection for more than 4 months so was discharged on Levaquin  in addition to linezolid . Pt states that her spinal cord stimulator surgical site infection has healed, but her right foot pain has been worsening. Pt was seen at wound clinic 10/16 for right foot wound, concern for worsening infection. Her right food redness and swelling are spreading up to lower leg, concerning for osteomyelitis, and then sent to ED for further evaluation and treatment.   10/16: to ED. R foot XR (+)mildly displaced avulsion fracture of the base of the fifth metatarsal with nonunion. R foot CT (+)confirms 5th MT fx unchanged, soft tissue generalized edema, no focal fluid collection, no sq gas, no bone lesion  10/17: to OR w/ podiatry, I&D and complex closure.  10/18: cultures no concerns thus far (on specimens from R foot / bone: rare GPC, few WBC), pathology pending. Cellulitis is improving, continue abx. PT/OT recs for SNF, TOC consult order placed.      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  Ulceration lateral midfoot right foot with necrosis of bone, osteomyelitis of fifth metatarsal base  Podiatry consult - to OR 10/17 I&D + bone resection 5th metatarsal  Await cultures    Essential hypertension IV hydralazine as needed Home Propranolol  dose reduced  / holding parameters    Hyperlipemia Crestor    Chronic diastolic CHF (congestive heart failure) not in exacerbation 2D echo on 04/15/2022 showed EF> 55% with grade 1 diastolic dysfunction.   For now, hold Lasix  and spironolactone  due to worsening renal function I&O   Type 2 diabetes mellitus with peripheral neuropathy  Recent A1c 5.7, well-controlled.   Patient is taking glipizide , Janumet , Ozempic SSI here for strict glc control - hypoglycemia so dc SSI  Continue glc checks for now, can restart insulin  if hyperglycemic   Anemia, iron deficiency:  Hemoglobin stable 11.6 on admission --> 9.7 HD2 likely dilutional d/t IV fluids. Continue iron supplement Monitor CBC   Acute renal failure superimposed on stage 3b chronic kidney disease Patient baseline creatinine 1.46 on 07/23/2024.   Her creatinine is 1.85, BUN 25, GFR 29. IV fluid  dc once taking po  Monitor BMP Hold Lasix  and spironolactone   for now   Class 2 obesity based on BMI: Body mass index is 36.74 kg/m.[name] Significantly low or high BMI is associated with higher medical risk.  Underweight - under 18  overweight - 25 to 29 obese - 30 or more Class 1 obesity: BMI of 30.0 to 34 Class 2 obesity: BMI of 35.0 to 39 Class 3 obesity: BMI of 40.0 to 49 Super Morbid Obesity: BMI 50-59 Super-super Morbid Obesity: BMI  60+ Healthy nutrition and physical activity advised as adjunct to other disease management and risk reduction treatments Continue Ozempic outpatient     DVT prophylaxis: heparin  IV fluids: no continuous IV fluids  Nutrition: cardiac/carb Central lines / other devices: none  Code Status: FULL CODE ACP documentation reviewed:  none on file in [facility]  [facility] needs: SNF rehab Medical barriers to dispo: IV abx pending cultures . Expected medical readiness for discharge 1-2 days.              Subjective / Brief ROS:  Patient reports feeling good following surgery Feeling weak, worked some w/ PT/OT and ok w/ rehab  Denies CP/SOB.  Pain controlled.  Denies new weakness.  Tolerating diet.  Reports no concerns w/ urination/defecation.   Family Communication: none at this time     Objective Findings:  Vitals:   08/17/24 1925 08/18/24 0500 08/18/24 0508 08/18/24 0816  BP: (!) 135/49  (!) 126/48 (!) 115/48  Pulse: 90  89 81  Resp: 17  17 16   Temp: 99 F (37.2 C)  98.7 F (37.1 C) 98.6 F (37 C)  TempSrc: Oral     SpO2: 100%  96% 95%  Weight:  112.3 kg    Height:        Intake/Output Summary (Last 24 hours) at 08/18/2024 1419 Last data filed at 08/18/2024 1405 Gross per 24 hour  Intake 400.08 ml  Output --  Net 400.08 ml   Filed Weights   08/17/24 0500 08/17/24 1259 08/18/24 0500  Weight: 109.6 kg 110 kg 112.3 kg    Examination:  Physical Exam Constitutional:      General: She is not in acute distress. Pulmonary:     Effort: Pulmonary effort is normal.     Breath sounds: Normal breath sounds.  Skin:    General: Skin is warm and dry.     Findings: No erythema.     Comments: Skin erythema receding compared to photos on admission  Neurological:     Mental Status: She is alert and oriented to person, place, and time. Mental status is at baseline.  Psychiatric:        Mood and Affect: Mood normal.        Behavior: Behavior normal.           Scheduled Medications:   aspirin  EC  81 mg Oral Daily   collagenase    Topical Daily   ferrous sulfate   325 mg Oral QHS   heparin  injection (subcutaneous)  5,000 Units Subcutaneous Q8H   pregabalin   100 mg Oral BID   propranolol  ER  60 mg Oral Daily   rosuvastatin   10 mg Oral Daily    Continuous Infusions:  linezolid  (ZYVOX ) IV 600 mg (08/18/24 0952)   piperacillin-tazobactam (ZOSYN)  IV Stopped (08/18/24 0950)    PRN Medications:  acetaminophen , dextrose , hydrALAZINE, HYDROmorphone  (DILAUDID ) injection, ondansetron  (ZOFRAN ) IV, oxyCODONE   Antimicrobials from admission:  Anti-infectives (From admission, onward)    Start     Dose/Rate Route Frequency Ordered Stop   08/17/24 2300  vancomycin  (VANCOREADY) IVPB 750 mg/150 mL  Status:  Discontinued        750 mg 150 mL/hr over 60 Minutes Intravenous Every 24 hours 08/16/24 2129 08/17/24 1016   08/17/24 2200  linezolid  (ZYVOX ) IVPB 600 mg        600 mg 300 mL/hr over 60 Minutes Intravenous Every 12 hours 08/17/24 1016     08/17/24 1400  piperacillin-tazobactam (ZOSYN) IVPB 3.375 g        3.375 g 12.5 mL/hr over 240 Minutes Intravenous Every 8 hours 08/17/24 1007     08/16/24 2230  vancomycin  (VANCOREADY) IVPB 1250 mg/250 mL        1,250 mg 166.7 mL/hr over 90 Minutes Intravenous  Once 08/16/24 2128 08/17/24 0054   08/16/24 2200  metroNIDAZOLE (FLAGYL) tablet 500 mg  Status:  Discontinued        500 mg Oral Every 12 hours 08/16/24 2113 08/17/24 1007   08/16/24 2130  cefTRIAXone  (ROCEPHIN ) 2 g in sodium chloride  0.9 % 100 mL IVPB  Status:  Discontinued        2 g 200 mL/hr over 30 Minutes Intravenous Every 24 hours 08/16/24 2112 08/17/24 1007   08/16/24 2045  vancomycin  (VANCOCIN ) IVPB 1000 mg/200 mL premix        1,000 mg 200 mL/hr over 60 Minutes Intravenous  Once 08/16/24 2038 08/16/24 2324           Data Reviewed:  I have personally reviewed the following...  CBC: Recent Labs  Lab 08/16/24 1523  08/17/24 0632  WBC 9.8 6.7  NEUTROABS 8.2*  --   HGB 11.6* 9.7*  HCT 36.1 29.3*  MCV 95.8 92.7  PLT 268 225   Basic Metabolic Panel: Recent Labs  Lab 08/16/24 1523 08/17/24 0632  NA 134* 134*  K 4.3 3.8  CL 100 106  CO2 21* 19*  GLUCOSE 191* 176*  BUN 25* 25*  CREATININE 1.85* 1.71*  CALCIUM  9.7 8.6*   GFR: Estimated Creatinine Clearance: 41.4 mL/min (A) (by C-G formula based on SCr of 1.71 mg/dL (H)). Liver Function Tests: Recent Labs  Lab 08/16/24 1523  AST 19  ALT 13  ALKPHOS 70  BILITOT 0.8  PROT 7.5  ALBUMIN 3.9   No results for input(s): LIPASE, AMYLASE in the last 168 hours. No results for input(s): AMMONIA in the last 168 hours. Coagulation Profile: Recent Labs  Lab 08/16/24 1523  INR 1.0   Cardiac Enzymes: No results for input(s): CKTOTAL, CKMB, CKMBINDEX, TROPONINI in the last 168 hours. BNP (last 3 results) No results for input(s): PROBNP in the last 8760 hours. HbA1C: No results for input(s): HGBA1C in the last 72 hours. CBG: Recent Labs  Lab 08/17/24 1418 08/17/24 1754 08/17/24 2130 08/18/24 0818 08/18/24 1136  GLUCAP 108* 70 121* 126* 190*   Lipid Profile: No results for input(s): CHOL, HDL, LDLCALC, TRIG, CHOLHDL, LDLDIRECT in the last 72 hours. Thyroid  Function Tests: No results for input(s): TSH, T4TOTAL, FREET4, T3FREE, THYROIDAB in the last 72 hours. Anemia Panel: No results for input(s): VITAMINB12, FOLATE, FERRITIN, TIBC, IRON, RETICCTPCT in the last 72 hours. Most Recent Urinalysis On File:     Component Value Date/Time   COLORURINE YELLOW (A) 08/16/2024 2204   APPEARANCEUR HAZY (A) 08/16/2024 2204   LABSPEC 1.016 08/16/2024 2204   PHURINE 5.0 08/16/2024 2204   GLUCOSEU 50 (A) 08/16/2024 2204   HGBUR NEGATIVE 08/16/2024 2204   BILIRUBINUR NEGATIVE 08/16/2024 2204   BILIRUBINUR neg 05/17/2019 1356   KETONESUR NEGATIVE  08/16/2024 2204   PROTEINUR NEGATIVE 08/16/2024  2204   UROBILINOGEN 0.2 05/17/2019 1356   NITRITE NEGATIVE 08/16/2024 2204   LEUKOCYTESUR TRACE (A) 08/16/2024 2204   Sepsis Labs: @LABRCNTIP (procalcitonin:4,lacticidven:4) Microbiology: Recent Results (from the past 240 hours)  Culture, blood (Routine x 2)     Status: None (Preliminary result)   Collection Time: 08/16/24  3:23 PM   Specimen: BLOOD  Result Value Ref Range Status   Specimen Description BLOOD BLOOD RIGHT ARM  Final   Special Requests   Final    BOTTLES DRAWN AEROBIC AND ANAEROBIC Blood Culture results may not be optimal due to an inadequate volume of blood received in culture bottles   Culture   Final    NO GROWTH 2 DAYS Performed at [facility], [address] [place], [place] [address]    Report Status PENDING  Incomplete  Culture, blood (Routine x 2)     Status: None (Preliminary result)   Collection Time: 08/16/24  6:28 PM   Specimen: BLOOD  Result Value Ref Range Status   Specimen Description BLOOD BLOOD LEFT ARM  Final   Special Requests   Final    BOTTLES DRAWN AEROBIC AND ANAEROBIC Blood Culture results may not be optimal due to an inadequate volume of blood received in culture bottles   Culture   Final    NO GROWTH 2 DAYS Performed at [facility], [address] [place], [place] [address]    Report Status PENDING  Incomplete  Aerobic/Anaerobic Culture w Gram Stain (surgical/deep wound)     Status: None (Preliminary result)   Collection Time: 08/17/24  1:45 PM   Specimen: Foot, Right; Tissue  Result Value Ref Range Status   Specimen Description   Final    FOOT Performed at [facility], [address] [place], [place] [address]    Special Requests   Final    NONE Performed at [facility], [address] [place], [place] [address]    Gram Stain   Final    RARE WBC PRESENT, PREDOMINANTLY PMN RARE GRAM POSITIVE COCCI    Culture   Final    NO GROWTH < 12 HOURS Performed at [facility],  [address] [place], [place] [address]    Report Status PENDING  Incomplete  Aerobic/Anaerobic Culture w Gram Stain (surgical/deep wound)     Status: None (Preliminary result)   Collection Time: 08/17/24  1:47 PM   Specimen: Bone; Tissue  Result Value Ref Range Status   Specimen Description   Final    BONE Performed at [facility], [address] [place], [place] [address]    Special Requests   Final    NONE Performed at [facility], [address] [place], [place] [address]    Gram Stain   Final    WBC PRESENT, PREDOMINANTLY PMN RARE GRAM POSITIVE COCCI    Culture   Final    NO GROWTH < 12 HOURS Performed at [facility], [address], [place], [place] [address]    Report Status PENDING  Incomplete  Aerobic/Anaerobic Culture w Gram Stain (surgical/deep wound)     Status: None (Preliminary result)   Collection Time: 08/17/24  1:48 PM   Specimen: Foot, Right; Tissue  Result Value Ref Range Status   Specimen Description   Final    FOOT Performed at [facility] [address] [place], [place] [address]    Special Requests  Final    NONE Performed at [facility], [address] [place], [place] [address]    Gram Stain   Final    FEW WBC PRESENT, PREDOMINANTLY MONONUCLEAR RARE GRAM POSITIVE COCCI    Culture   Final    NO GROWTH < 12 HOURS Performed at [facility], [address], [place], [place] [address]    Report Status PENDING  Incomplete      Radiology Studies last 3 days: US  Venous Img Lower Unilateral Right (DVT) Result Date: 08/18/2024 CLINICAL DATA:  Right lower extremity swelling status post recent foot surgery. EXAM: RIGHT LOWER EXTREMITY VENOUS DOPPLER ULTRASOUND TECHNIQUE: Gray-scale sonography with compression, as well as color and duplex ultrasound, were performed to evaluate the deep venous system(s) from the level of the common femoral vein through the popliteal and proximal calf veins.  COMPARISON:  None Available. FINDINGS: VENOUS Normal compressibility of the common femoral, superficial femoral, and popliteal veins, as well as the visualized calf veins. Visualized portions of profunda femoral vein and great saphenous vein unremarkable. No filling defects to suggest DVT on grayscale or color Doppler imaging. Doppler waveforms show normal direction of venous flow, normal respiratory plasticity and response to augmentation. Limited views of the contralateral common femoral vein are unremarkable. OTHER None. Limitations: none IMPRESSION: Negative. Electronically Signed   By: Wilkie Lent M.D.   On: 08/18/2024 08:35   DG Foot 2 Views Right Result Date: 08/17/2024 EXAM: 2 VIEW(S) XRAY OF THE RIGHT FOOT 08/17/2024 03:01:16 PM COMPARISON: 08/16/2024 CLINICAL HISTORY: Post-operative state [id number]. Status post Right foot surgery. FINDINGS: BONES AND JOINTS: Interval resection of the proximal fifth metatarsal. No acute fracture. No joint dislocation. SOFT TISSUES: Lateral skin staples in place. Soft tissue swelling and soft tissue gas. IMPRESSION: 1. Interval resection of the proximal fifth metatarsal. 2. Soft tissue swelling and soft tissue gas compatible with postsurgical changes. Electronically signed by: Donnice Mania MD 08/17/2024 09:38 PM EDT RP Workstation: [id number]   CT FOOT RIGHT WO CONTRAST Result Date: 08/16/2024 EXAM: CT RIGHT FOOT, WITHOUT IV CONTRAST 08/16/2024 10:25:54 PM TECHNIQUE: Axial images were acquired through the right foot without IV contrast. Reformatted images were reviewed. Automated exposure control, iterative reconstruction, and/or weight based adjustment of the mA/kV was utilized to reduce the radiation dose to as low as reasonably achievable. COMPARISON: Right foot series today, right foot series 07/21/2024. No prior cross-sectional imaging of the right foot for comparison. CLINICAL HISTORY: Foot swelling, diabetic, osteomyelitis suspected, xray done. FINDINGS:  BONES AND JOINTS: There is a mildly displaced transverse 5th metatarsal base fracture, first seen on 07/21/2024, age indeterminate with no healing callus. There are no further fractures. . There is a shallow depression in the overlying skin adjacent to the fracture; this could represent a small skin wound, but there is no underlying bony destruction, although an early infection in the fracture bed is difficult to exclude. There is mild hallux valgus with otherwise normal intraosseous alignment. There is mild midfoot arthrosis, early degenerative change in the toe joints, and mild nonerosive arthrosis in the 1st MTP joint, but no evidence of an erosive arthropathy, Charcot's arthropathy, or other significant focal bone abnormality or focal pathologic process. There are small noninflammatory-appearing dorsal and plantar calcaneal spurs. The ankle mortise is symmetric. No dislocation. The joint spaces are normal, except for the described arthrosis. SOFT TISSUES: There is moderate generalized edema in the ankle and foot. Partial atrophy in the plantar intrinsic foot musculature, which may be seen with diabetes. Area  tendons are not well evaluated with CT, but they are grossly intact. There are no focal fluid collections either in the subcutaneous plane or deep spaces. No soft tissue gas is seen. Shallow depression in the overlying skin adjacent to the 5th metatarsal base fracture, as described in BONES AND JOINTS. IMPRESSION: 1. Mildly displaced transverse 5th metatarsal base fracture without healing callus, first seen on 07/21/2024 films and unchanged in appearance ; early fracture bed infection is difficult to exclude, but there is no destructive bone lesion . 2. Moderate generalized edema in the ankle and foot without focal fluid collection or soft tissue gas. 3. Partial atrophy of the plantar intrinsic foot musculature, which may be seen with diabetes. Electronically signed by: Francis Quam MD 08/16/2024 10:52 PM  EDT RP Workstation: [id number]   DG Foot Complete Right Result Date: 08/16/2024 CLINICAL DATA:  Wound infection. EXAM: RIGHT FOOT COMPLETE - 3+ VIEW COMPARISON:  Radiograph dated 07/21/2024. FINDINGS: Mildly displaced avulsion fracture of the base of the fifth metatarsal with nonunion. No new fracture. No dislocation. Focal skin irregularity over the lateral foot likely related to dressing. Clinical correlation is recommended. No soft tissue gas. IMPRESSION: Mildly displaced avulsion fracture of the base of the fifth metatarsal with nonunion. Electronically Signed   By: Vanetta Chou M.D.   On: 08/16/2024 16:37         Billy Rocco, DO [facility] 08/18/2024, 2:19 PM    Dictation software may have been used to generate the above note. Typos may occur and escape review in typed/dictated notes. Please contact Dr Marsa directly for clarity if needed.  Staff may message me via secure chat in Epic  but this may not receive an immediate response,  please page me for urgent matters!  If 7PM-7AM, please contact night coverage [email or web address]

## 2024-08-18 NOTE — Care Management Important Message (Signed)
 Important Message  Patient Details  Name: Melody Brooks MRN: [id number] Date of Birth: 10/15/1956   Important Message Given:  Yes - Medicare IM     [name] 08/18/2024, 2:11 PM

## 2024-08-18 NOTE — Plan of Care (Signed)

## 2024-08-19 DIAGNOSIS — L089 Local infection of the skin and subcutaneous tissue, unspecified: Secondary | ICD-10-CM | POA: Diagnosis not present

## 2024-08-19 DIAGNOSIS — E11628 Type 2 diabetes mellitus with other skin complications: Secondary | ICD-10-CM | POA: Diagnosis not present

## 2024-08-19 LAB — BASIC METABOLIC PANEL WITH GFR
Anion gap: 10 (ref 5–15)
BUN: 18 mg/dL (ref 8–23)
CO2: 22 mmol/L (ref 22–32)
Calcium: 9 mg/dL (ref 8.9–10.3)
Chloride: 106 mmol/L (ref 98–111)
Creatinine, Ser: 1.35 mg/dL — ABNORMAL HIGH (ref 0.44–1.00)
GFR, Estimated: 43 mL/min — ABNORMAL LOW (ref >60)
Glucose, Bld: 115 mg/dL — ABNORMAL HIGH (ref 70–99)
Potassium: 3.6 mmol/L (ref 3.5–5.1)
Sodium: 138 mmol/L (ref 135–145)

## 2024-08-19 LAB — CBC
HCT: 28.9 % — ABNORMAL LOW (ref 36.0–46.0)
Hemoglobin: 9.2 g/dL — ABNORMAL LOW (ref 12.0–15.0)
MCH: 29.9 pg (ref 26.0–34.0)
MCHC: 31.8 g/dL (ref 30.0–36.0)
MCV: 93.8 fL (ref 80.0–100.0)
Platelets: 201 K/uL (ref 150–400)
RBC: 3.08 MIL/uL — ABNORMAL LOW (ref 3.87–5.11)
RDW: 14.4 % (ref 11.5–15.5)
WBC: 4.7 K/uL (ref 4.0–10.5)
nRBC: 0 % (ref 0.0–0.2)

## 2024-08-19 MED ORDER — POLYETHYLENE GLYCOL 3350 17 G PO PACK
17.0000 g | PACK | Freq: Two times a day (BID) | ORAL | Status: DC | PRN
  Administered 2024-08-19: 17 g via ORAL
  Filled 2024-08-19: qty 1

## 2024-08-19 MED ORDER — SENNOSIDES-DOCUSATE SODIUM 8.6-50 MG PO TABS
2.0000 | ORAL_TABLET | Freq: Every day | ORAL | Status: DC | PRN
  Administered 2024-08-19: 2 via ORAL
  Filled 2024-08-19: qty 2

## 2024-08-19 NOTE — Progress Notes (Signed)
   Subjective: 2 Days Post-Op Procedure(s) (LRB): IRRIGATION AND DEBRIDEMENT FOOT (Right) AMPUTATION, PARTIAL FIFTH METATARSAL (Right) DOS: 08/17/2024. Dr. Marolyn Honour DPM  Objective: Vital signs in last 24 hours: Temp:  [97.5 F (36.4 C)-98.1 F (36.7 C)] 97.5 F (36.4 C) (10/19 0851) Pulse Rate:  [61-68] 61 (10/19 0851) Resp:  [16-20] 16 (10/19 0851) BP: (114-130)/(47-65) 114/65 (10/19 0851) SpO2:  [97 %-100 %] 99 % (10/19 0851) Weight:  [106 kg] 106 kg (10/19 0500)  Recent Labs    08/16/24 1523 08/17/24 0632 08/19/24 0503  HGB 11.6* 9.7* 9.2*   Recent Labs    08/17/24 0632 08/19/24 0503  WBC 6.7 4.7  RBC 3.16* 3.08*  HCT 29.3* 28.9*  PLT 225 201   Recent Labs    08/17/24 0632 08/19/24 0503  NA 134* 138  K 3.8 3.6  CL 106 106  CO2 19* 22  BUN 25* 18  CREATININE 1.71* 1.35*  GLUCOSE 176* 115*  CALCIUM  8.6* 9.0   Recent Labs    08/16/24 1523  INR 1.0   Aerobic/Anaerobic Culture w Gram Stain (surgical/deep wound) Order: [id number]  Status: Preliminary result     Next appt: 08/20/2024 at 07:30 AM in Wound Care (STONE III, Andre Cava, PA-C)   Test Result Released: No   Specimen Information: Foot, Right; Tissue  0 Result Notes    Component Ref Range & Units (hover) 2 d ago  Specimen Description FOOT Performed at [facility], [address], [place], [place] [address]  Special Requests NONE Performed at [facility], [address] [place], [place] [address]  Gram Stain FEW WBC PRESENT, PREDOMINANTLY MONONUCLEAR RARE GRAM POSITIVE COCCI Performed at [facility], [address], [place], [place] [address]  Culture FEW STAPHYLOCOCCUS AUREUS  Report Status PENDING      RT foot POV#1. 08/19/2024  Physical Exam: Sutures and staples intact. No active drainage or bleeding. Slight maceration noted along incision site. Improved erythema. Moderate edema noted localized to the foot.   Assessment/Plan: 2 Days Post-Op  Procedure(s) (LRB): IRRIGATION AND DEBRIDEMENT FOOT (Right) AMPUTATION, PARTIAL FIFTH METATARSAL (Right) DOS: 08/17/2024. Dr. Marolyn Honour DPM  -Patient seen at bedside -Dressings changed.  Leave clean dry and intact -Bone path results pending.  Infectious disease consult pending. -Continue IV ABX linezolid  and Zosyn as ordered -According to the patient she has requested placement to a rehab facility.  Hospitalist has been notified.  Pending placement this week. -Minimal FWB surgical shoe -Once patient is discharged to rehab facility recommend dressing changes every other day.  Order placed: Paint incision with Betadine, Xeroform, ABD, large Kerlix, Ace wrap. -Podiatry will sign off. F/u in office 3 weeks post discharge for suture and staple removal.     Melody Brooks 08/19/2024, 11:34 AM  Melody Brooks, DPM [facility]  Dr. Thresa EMERSON Brooks, DPM    [address] [place], [place] [address]                Office [phone]  Fax [phone]

## 2024-08-19 NOTE — Progress Notes (Signed)
 PROGRESS NOTE    Melody Brooks   FMW:[id number] DOB: Mar 16, 1956  DOA: 08/16/2024 Date of Service: 08/19/24 which is hospital day 3  PCP: Auston Reyes BIRCH, MD    Hospital course / significant events:   HPI: Melody Brooks is a 68 y.o. female with medical history significant of DM, HTN, HLD, dCHF, CKD-3b, angioedema, chronic pain, anemia, obesity, s/p spinal cord stimulator placement, who presents with right foot wound with concern for infection. She was recently hospitalized from 9/17 - 9/22 for spinal cord stimulator surgical site infection - superficial, I&D abscess 09/21, no concern for infection of the actual device. Cultures were obtained growing MRSA as of 9/21.  Pt was discharged on linezolid  for 1 week. Pt also had chronic right foot wound with infection for more than 4 months so was discharged on Levaquin  in addition to linezolid . Pt states that her spinal cord stimulator surgical site infection has healed, but her right foot pain has been worsening. Pt was seen at wound clinic 10/16 for right foot wound, concern for worsening infection. Her right food redness and swelling are spreading up to lower leg, concerning for osteomyelitis, and then sent to ED for further evaluation and treatment.   10/16: to ED. R foot XR (+)mildly displaced avulsion fracture of the base of the fifth metatarsal with nonunion. R foot CT (+)confirms 5th MT fx unchanged, soft tissue generalized edema, no focal fluid collection, no sq gas, no bone lesion  10/17: to OR w/ podiatry, I&D and complex closure.  10/18: cultures no concerns thus far (on specimens from R foot / bone: rare GPC, few WBC), pathology pending. Cellulitis is improving, continue abx. PT/OT recs for SNF, TOC consult order placed.      Consultants:  Podiatry   Procedures/Surgeries: 08/17/24 Incision and drainage with fifth metatarsal base resection, right foot; Complex closure of wound right lateral midfoot approximately 5 x 1 cm - Dr  Malvin       ASSESSMENT & PLAN:   Diabetic foot infection on right foot with wound Cellulitis RLE  NO sepsis  Ruled out DVT Continue IV ABX linezolid  and Zosyn  Podiatry consult - to OR 10/17 I&D NWB postop shoe  Await cultures --> no concerns thus far  Minimal FWB surgical shoe Once patient is discharged to rehab facility recommend dressing changes every other day: Paint incision with Betadine, Xeroform, ABD, large Kerlix, Ace wrap. Podiatry has signed off. F/u in office 3 weeks post discharge for suture and staple removal.   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   For now, hold Lasix  and spironolactone  due to worsening renal function I&O   Type 2 diabetes mellitus with peripheral neuropathy  Recent A1c 5.7, well-controlled.   Patient is taking glipizide , Janumet , Ozempic SSI here for strict glc control - hypoglycemia so dc SSI  Continue glc checks for now, can restart insulin  if hyperglycemic   Anemia, iron deficiency:  Hemoglobin stable 11.6 on admission --> 9.7 HD2 likely dilutional d/t IV  fluids. Continue iron supplement Monitor CBC   Acute renal failure superimposed on stage 3b chronic kidney disease Patient baseline creatinine 1.46 on 07/23/2024.   Her creatinine is 1.85, BUN 25, GFR 29. IV fluid  dc once taking po  Monitor BMP Hold Lasix  and spironolactone  for now   Class 2 obesity based on BMI: Body mass index is 36.74 kg/m.[name]  Significantly low or high BMI is associated with higher medical risk.  Underweight - under 18  overweight - 25 to 29 obese - 30 or more Class 1 obesity: BMI of 30.0 to 34 Class 2 obesity: BMI of 35.0 to 39 Class 3 obesity: BMI of 40.0 to 49 Super Morbid Obesity: BMI 50-59 Super-super Morbid Obesity: BMI 60+ Healthy nutrition and physical activity advised as adjunct to other disease management and risk reduction treatments Continue Ozempic outpatient     DVT prophylaxis: heparin  IV fluids: no continuous IV fluids  Nutrition: cardiac/carb Central lines / other devices: none  Code Status: FULL CODE ACP documentation reviewed:  none on file in [facility]  [facility] needs: SNF rehab Medical barriers to dispo: IV abx pending cultures . Expected medical readiness for discharge tomorrow / day after               Subjective / Brief ROS:  Patient reports feeling good today Pain controlled  Feeling weak, worked some w/ PT/OT and ok w/ rehab  Denies CP/SOB.   Family Communication: none at this time     Objective Findings:  Vitals:   08/18/24 1943 08/19/24 0434 08/19/24 0500 08/19/24 0851  BP: (!) 129/53 (!) 130/55  114/65  Pulse: 64 64  61  Resp: 20 18  16   Temp: 97.9 F (36.6 C) 97.7 F (36.5 C)  (!) 97.5 F (36.4 C)  TempSrc:    Oral  SpO2: 100% 97%  99%  Weight:   106 kg   Height:        Intake/Output Summary (Last 24 hours) at 08/19/2024 1606 Last data filed at 08/19/2024 1300 Gross per 24 hour  Intake 300 ml  Output --  Net 300 ml   Filed Weights   08/17/24 1259 08/18/24 0500 08/19/24 0500  Weight: 110 kg 112.3 kg 106 kg    Examination:  Physical Exam Constitutional:      General: She is not in acute distress. Cardiovascular:     Rate and Rhythm: Normal rate and regular rhythm.  Pulmonary:     Effort: Pulmonary effort is normal.     Breath sounds: Normal breath sounds.  Skin:    General: Skin is warm and dry.     Findings: No erythema.      Comments: Skin erythema receding compared to photos on admission  Neurological:     Mental Status: She is alert and oriented to person, place, and time. Mental status is at baseline.  Psychiatric:        Mood and Affect: Mood normal.        Behavior: Behavior normal.          Scheduled Medications:   aspirin  EC  81 mg Oral Daily   collagenase    Topical Daily   ferrous sulfate   325 mg Oral QHS   heparin  injection (subcutaneous)  5,000 Units Subcutaneous Q8H   pregabalin   100 mg Oral BID   propranolol  ER  60 mg Oral Daily   rosuvastatin   10 mg Oral Daily    Continuous Infusions:  linezolid  (ZYVOX ) IV 600 mg (08/19/24 1152)  piperacillin-tazobactam (ZOSYN)  IV 3.375 g (08/19/24 1438)    PRN Medications:  acetaminophen , dextrose , hydrALAZINE, HYDROmorphone  (DILAUDID ) injection, ondansetron  (ZOFRAN ) IV, oxyCODONE , polyethylene glycol, senna-docusate  Antimicrobials from admission:  Anti-infectives (From admission, onward)    Start     Dose/Rate Route Frequency Ordered Stop   08/17/24 2300  vancomycin  (VANCOREADY) IVPB 750 mg/150 mL  Status:  Discontinued        750 mg 150 mL/hr over 60 Minutes Intravenous Every 24 hours 08/16/24 2129 08/17/24 1016   08/17/24 2200  linezolid  (ZYVOX ) IVPB 600 mg        600 mg 300 mL/hr over 60 Minutes Intravenous Every 12 hours 08/17/24 1016     08/17/24 1400  piperacillin-tazobactam (ZOSYN) IVPB 3.375 g        3.375 g 12.5 mL/hr over 240 Minutes Intravenous Every 8 hours 08/17/24 1007     08/16/24 2230  vancomycin  (VANCOREADY) IVPB 1250 mg/250 mL        1,250 mg 166.7 mL/hr over 90 Minutes Intravenous  Once 08/16/24 2128 08/17/24 0054   08/16/24 2200  metroNIDAZOLE (FLAGYL) tablet 500 mg  Status:  Discontinued        500 mg Oral Every 12 hours 08/16/24 2113 08/17/24 1007   08/16/24 2130  cefTRIAXone  (ROCEPHIN ) 2 g in sodium chloride  0.9 % 100 mL IVPB  Status:  Discontinued        2 g 200 mL/hr over 30 Minutes Intravenous Every 24 hours  08/16/24 2112 08/17/24 1007   08/16/24 2045  vancomycin  (VANCOCIN ) IVPB 1000 mg/200 mL premix        1,000 mg 200 mL/hr over 60 Minutes Intravenous  Once 08/16/24 2038 08/16/24 2324           Data Reviewed:  I have personally reviewed the following...  CBC: Recent Labs  Lab 08/16/24 1523 08/17/24 0632 08/19/24 0503  WBC 9.8 6.7 4.7  NEUTROABS 8.2*  --   --   HGB 11.6* 9.7* 9.2*  HCT 36.1 29.3* 28.9*  MCV 95.8 92.7 93.8  PLT 268 225 201   Basic Metabolic Panel: Recent Labs  Lab 08/16/24 1523 08/17/24 0632 08/19/24 0503  NA 134* 134* 138  K 4.3 3.8 3.6  CL 100 106 106  CO2 21* 19* 22  GLUCOSE 191* 176* 115*  BUN 25* 25* 18  CREATININE 1.85* 1.71* 1.35*  CALCIUM  9.7 8.6* 9.0   GFR: Estimated Creatinine Clearance: 50.8 mL/min (A) (by C-G formula based on SCr of 1.35 mg/dL (H)). Liver Function Tests: Recent Labs  Lab 08/16/24 1523  AST 19  ALT 13  ALKPHOS 70  BILITOT 0.8  PROT 7.5  ALBUMIN 3.9   No results for input(s): LIPASE, AMYLASE in the last 168 hours. No results for input(s): AMMONIA in the last 168 hours. Coagulation Profile: Recent Labs  Lab 08/16/24 1523  INR 1.0   Cardiac Enzymes: No results for input(s): CKTOTAL, CKMB, CKMBINDEX, TROPONINI in the last 168 hours. BNP (last 3 results) No results for input(s): PROBNP in the last 8760 hours. HbA1C: No results for input(s): HGBA1C in the last 72 hours. CBG: Recent Labs  Lab 08/17/24 1418 08/17/24 1754 08/17/24 2130 08/18/24 0818 08/18/24 1136  GLUCAP 108* 70 121* 126* 190*   Lipid Profile: No results for input(s): CHOL, HDL, LDLCALC, TRIG, CHOLHDL, LDLDIRECT in the last 72 hours. Thyroid  Function Tests: No results for input(s): TSH, T4TOTAL, FREET4, T3FREE, THYROIDAB in the last 72 hours. Anemia Panel: No results for input(s): VITAMINB12, FOLATE, FERRITIN, TIBC,  IRON, RETICCTPCT in the last 72 hours. Most Recent Urinalysis On  File:     Component Value Date/Time   COLORURINE YELLOW (A) 08/16/2024 2204   APPEARANCEUR HAZY (A) 08/16/2024 2204   LABSPEC 1.016 08/16/2024 2204   PHURINE 5.0 08/16/2024 2204   GLUCOSEU 50 (A) 08/16/2024 2204   HGBUR NEGATIVE 08/16/2024 2204   BILIRUBINUR NEGATIVE 08/16/2024 2204   BILIRUBINUR neg 05/17/2019 1356   KETONESUR NEGATIVE 08/16/2024 2204   PROTEINUR NEGATIVE 08/16/2024 2204   UROBILINOGEN 0.2 05/17/2019 1356   NITRITE NEGATIVE 08/16/2024 2204   LEUKOCYTESUR TRACE (A) 08/16/2024 2204   Sepsis Labs: @LABRCNTIP (procalcitonin:4,lacticidven:4) Microbiology: Recent Results (from the past 240 hours)  Culture, blood (Routine x 2)     Status: None (Preliminary result)   Collection Time: 08/16/24  3:23 PM   Specimen: BLOOD  Result Value Ref Range Status   Specimen Description BLOOD BLOOD RIGHT ARM  Final   Special Requests   Final    BOTTLES DRAWN AEROBIC AND ANAEROBIC Blood Culture results may not be optimal due to an inadequate volume of blood received in culture bottles   Culture   Final    NO GROWTH 3 DAYS Performed at [facility], [address] [place], [place] [address]    Report Status PENDING  Incomplete  Culture, blood (Routine x 2)     Status: None (Preliminary result)   Collection Time: 08/16/24  6:28 PM   Specimen: BLOOD  Result Value Ref Range Status   Specimen Description BLOOD BLOOD LEFT ARM  Final   Special Requests   Final    BOTTLES DRAWN AEROBIC AND ANAEROBIC Blood Culture results may not be optimal due to an inadequate volume of blood received in culture bottles   Culture   Final    NO GROWTH 3 DAYS Performed at [facility], [address] [place], [place] [address]    Report Status PENDING  Incomplete  Aerobic/Anaerobic Culture w Gram Stain (surgical/deep wound)     Status: None (Preliminary result)   Collection Time: 08/17/24  1:45 PM   Specimen: Foot, Right; Tissue  Result Value Ref Range Status   Specimen  Description   Final    FOOT Performed at [facility], [address] [place], [place] [address]    Special Requests   Final    NONE Performed at [facility], [address] [place], [place] [address]    Gram Stain   Final    RARE WBC PRESENT, PREDOMINANTLY PMN RARE GRAM POSITIVE COCCI Performed at [facility], [address], [place], [place] [address]    Culture   Final    RARE STAPHYLOCOCCUS AUREUS SUSCEPTIBILITIES TO FOLLOW NO ANAEROBES ISOLATED; CULTURE IN PROGRESS FOR 5 DAYS    Report Status PENDING  Incomplete  Aerobic/Anaerobic Culture w Gram Stain (surgical/deep wound)     Status: None (Preliminary result)   Collection Time: 08/17/24  1:47 PM   Specimen: Bone; Tissue  Result Value Ref Range Status   Specimen Description   Final    BONE Performed at [facility], [address], [place], [place] [address]    Special Requests   Final    NONE Performed at [facility], [address] [place], [place] [address]    Gram Stain   Final    WBC PRESENT, PREDOMINANTLY PMN RARE GRAM POSITIVE COCCI Performed at [facility], [address], [place], [place] [address]    Culture  Final    RARE STAPHYLOCOCCUS AUREUS SUSCEPTIBILITIES TO FOLLOW NO ANAEROBES ISOLATED; CULTURE IN PROGRESS FOR 5 DAYS    Report Status PENDING  Incomplete  Aerobic/Anaerobic Culture w Gram Stain (surgical/deep wound)     Status: None (Preliminary result)   Collection Time: 08/17/24  1:48 PM   Specimen: Foot, Right; Tissue  Result Value Ref Range Status   Specimen Description   Final    FOOT Performed at [facility], [address], [place], [place] [address]    Special Requests   Final    NONE Performed at [facility], [address], [place], [place] [address]    Gram Stain   Final    FEW WBC PRESENT, PREDOMINANTLY MONONUCLEAR RARE GRAM POSITIVE COCCI Performed at [facility], [address],  [place], [place] [address]    Culture   Final    FEW STAPHYLOCOCCUS AUREUS NO ANAEROBES ISOLATED; CULTURE IN PROGRESS FOR 5 DAYS    Report Status PENDING  Incomplete      Radiology Studies last 3 days: US  Venous Img Lower Unilateral Right (DVT) Result Date: 08/18/2024 CLINICAL DATA:  Right lower extremity swelling status post recent foot surgery. EXAM: RIGHT LOWER EXTREMITY VENOUS DOPPLER ULTRASOUND TECHNIQUE: Gray-scale sonography with compression, as well as color and duplex ultrasound, were performed to evaluate the deep venous system(s) from the level of the common femoral vein through the popliteal and proximal calf veins. COMPARISON:  None Available. FINDINGS: VENOUS Normal compressibility of the common femoral, superficial femoral, and popliteal veins, as well as the visualized calf veins. Visualized portions of profunda femoral vein and great saphenous vein unremarkable. No filling defects to suggest DVT on grayscale or color Doppler imaging. Doppler waveforms show normal direction of venous flow, normal respiratory plasticity and response to augmentation. Limited views of the contralateral common femoral vein are unremarkable. OTHER None. Limitations: none IMPRESSION: Negative. Electronically Signed   By: Wilkie Lent M.D.   On: 08/18/2024 08:35   DG Foot 2 Views Right Result Date: 08/17/2024 EXAM: 2 VIEW(S) XRAY OF THE RIGHT FOOT 08/17/2024 03:01:16 PM COMPARISON: 08/16/2024 CLINICAL HISTORY: Post-operative state [id number]. Status post Right foot surgery. FINDINGS: BONES AND JOINTS: Interval resection of the proximal fifth metatarsal. No acute fracture. No joint dislocation. SOFT TISSUES: Lateral skin staples in place. Soft tissue swelling and soft tissue gas. IMPRESSION: 1. Interval resection of the proximal fifth metatarsal. 2. Soft tissue swelling and soft tissue gas compatible with postsurgical changes. Electronically signed by: Donnice Mania MD 08/17/2024 09:38 PM EDT RP Workstation:  [id number]   CT FOOT RIGHT WO CONTRAST Result Date: 08/16/2024 EXAM: CT RIGHT FOOT, WITHOUT IV CONTRAST 08/16/2024 10:25:54 PM TECHNIQUE: Axial images were acquired through the right foot without IV contrast. Reformatted images were reviewed. Automated exposure control, iterative reconstruction, and/or weight based adjustment of the mA/kV was utilized to reduce the radiation dose to as low as reasonably achievable. COMPARISON: Right foot series today, right foot series 07/21/2024. No prior cross-sectional imaging of the right foot for comparison. CLINICAL HISTORY: Foot swelling, diabetic, osteomyelitis suspected, xray done. FINDINGS: BONES AND JOINTS: There is a mildly displaced transverse 5th metatarsal base fracture, first seen on 07/21/2024, age indeterminate with no healing callus. There are no further fractures. . There is a shallow depression in the overlying skin adjacent to the fracture; this could represent a small skin wound, but there is no underlying bony destruction, although an early infection in the fracture bed is difficult to exclude. There is mild  hallux valgus with otherwise normal intraosseous alignment. There is mild midfoot arthrosis, early degenerative change in the toe joints, and mild nonerosive arthrosis in the 1st MTP joint, but no evidence of an erosive arthropathy, Charcot's arthropathy, or other significant focal bone abnormality or focal pathologic process. There are small noninflammatory-appearing dorsal and plantar calcaneal spurs. The ankle mortise is symmetric. No dislocation. The joint spaces are normal, except for the described arthrosis. SOFT TISSUES: There is moderate generalized edema in the ankle and foot. Partial atrophy in the plantar intrinsic foot musculature, which may be seen with diabetes. Area tendons are not well evaluated with CT, but they are grossly intact. There are no focal fluid collections either in the subcutaneous plane or deep spaces. No soft tissue gas  is seen. Shallow depression in the overlying skin adjacent to the 5th metatarsal base fracture, as described in BONES AND JOINTS. IMPRESSION: 1. Mildly displaced transverse 5th metatarsal base fracture without healing callus, first seen on 07/21/2024 films and unchanged in appearance ; early fracture bed infection is difficult to exclude, but there is no destructive bone lesion . 2. Moderate generalized edema in the ankle and foot without focal fluid collection or soft tissue gas. 3. Partial atrophy of the plantar intrinsic foot musculature, which may be seen with diabetes. Electronically signed by: Francis Quam MD 08/16/2024 10:52 PM EDT RP Workstation: [id number]   DG Foot Complete Right Result Date: 08/16/2024 CLINICAL DATA:  Wound infection. EXAM: RIGHT FOOT COMPLETE - 3+ VIEW COMPARISON:  Radiograph dated 07/21/2024. FINDINGS: Mildly displaced avulsion fracture of the base of the fifth metatarsal with nonunion. No new fracture. No dislocation. Focal skin irregularity over the lateral foot likely related to dressing. Clinical correlation is recommended. No soft tissue gas. IMPRESSION: Mildly displaced avulsion fracture of the base of the fifth metatarsal with nonunion. Electronically Signed   By: Vanetta Chou M.D.   On: 08/16/2024 16:37         Aidenn Skellenger, DO [facility] 08/19/2024, 4:06 PM    Dictation software may have been used to generate the above note. Typos may occur and escape review in typed/dictated notes. Please contact Dr Marsa directly for clarity if needed.  Staff may message me via secure chat in Epic  but this may not receive an immediate response,  please page me for urgent matters!  If 7PM-7AM, please contact night coverage [email or web address]

## 2024-08-20 ENCOUNTER — Other Ambulatory Visit (HOSPITAL_COMMUNITY): Payer: Self-pay

## 2024-08-20 ENCOUNTER — Encounter: Admitting: Physician Assistant

## 2024-08-20 DIAGNOSIS — E11628 Type 2 diabetes mellitus with other skin complications: Secondary | ICD-10-CM | POA: Diagnosis not present

## 2024-08-20 DIAGNOSIS — L089 Local infection of the skin and subcutaneous tissue, unspecified: Secondary | ICD-10-CM | POA: Diagnosis not present

## 2024-08-20 NOTE — Plan of Care (Signed)
  Problem: Nutritional: Goal: Maintenance of adequate nutrition will improve Outcome: Progressing   Problem: Health Behavior/Discharge Planning: Goal: Ability to manage health-related needs will improve Outcome: Progressing   Problem: Coping: Goal: Level of anxiety will decrease Outcome: Progressing   Problem: Pain Managment: Goal: General experience of comfort will improve and/or be controlled Outcome: Progressing

## 2024-08-20 NOTE — Progress Notes (Signed)
 PROGRESS NOTE    Melody Brooks   FMW:[id number] DOB: 1956-06-28  DOA: 08/16/2024 Date of Service: 08/20/24 which is hospital day 4  PCP: Auston Reyes BIRCH, MD    Hospital course / significant events:   HPI: Melody Brooks is a 68 y.o. female with medical history significant of DM, HTN, HLD, dCHF, CKD-3b, angioedema, chronic pain, anemia, obesity, s/p spinal cord stimulator placement, who presents with right foot wound with concern for infection. She was recently hospitalized from 9/17 - 9/22 for spinal cord stimulator surgical site infection - superficial, I&D abscess 09/21, no concern for infection of the actual device. Cultures were obtained growing MRSA as of 9/21.  Pt was discharged on linezolid  for 1 week. Pt also had chronic right foot wound with infection for more than 4 months so was discharged on Levaquin  in addition to linezolid . Pt states that her spinal cord stimulator surgical site infection has healed, but her right foot pain has been worsening. Pt was seen at wound clinic 10/16 for right foot wound, concern for worsening infection. Her right food redness and swelling are spreading up to lower leg, concerning for osteomyelitis, and then sent to ED for further evaluation and treatment.   10/16: to ED. R foot XR (+)mildly displaced avulsion fracture of the base of the fifth metatarsal with nonunion. R foot CT (+)confirms 5th MT fx unchanged, soft tissue generalized edema, no focal fluid collection, no sq gas, no bone lesion  10/17: to OR w/ podiatry, I&D and complex closure.  10/18: cultures no concerns thus far (on specimens from R foot / bone: rare GPC, few WBC), pathology pending. Cellulitis is improving, continue abx. PT/OT recs for SNF, TOC consult order placed.  10/19: await cultures and placement 10/20: (+)MRSA, narrow abx to linezolid . If good margins on bone path will just need po abx      Consultants:  Podiatry   Procedures/Surgeries: 08/17/24 Incision and  drainage with fifth metatarsal base resection, right foot; Complex closure of wound right lateral midfoot approximately 5 x 1 cm - Dr Malvin       ASSESSMENT & PLAN:   Diabetic foot infection on right foot with wound (+)MRSA Cellulitis RLE  NO sepsis  Ruled out DVT  (+)MRSA, narrow abx to linezolid . If good margins on bone path will just need po abx  Podiatry consult - to OR 10/17 I&D NWB postop shoe  Minimal FWB surgical shoe Once patient is discharged to rehab facility recommend dressing changes every other day: Paint incision with Betadine, Xeroform, ABD, large Kerlix, Ace wrap. Podiatry has signed off. F/u in office 3 weeks post discharge for suture and staple removal.   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   For now, hold Lasix  and spironolactone  due to worsening renal function I&O   Type 2 diabetes mellitus with peripheral neuropathy  Recent A1c 5.7, well-controlled.   Patient is taking glipizide , Janumet , Ozempic SSI here for strict glc control - hypoglycemia so dc SSI  Continue  glc checks for now, can restart insulin  if hyperglycemic   Anemia, iron deficiency:  Hemoglobin stable 11.6 on admission --> 9.7 HD2 likely dilutional d/t IV fluids. Continue iron supplement Monitor CBC   Acute renal failure superimposed on stage 3b chronic kidney disease Patient baseline creatinine 1.46 on 07/23/2024.   Her creatinine is 1.85, BUN 25, GFR 29. IV fluid   dc once taking po  Monitor BMP Hold Lasix  and spironolactone  for now   Class 2 obesity based on BMI: Body mass index is 36.74 kg/m.[name] Significantly low or high BMI is associated with higher medical risk.  Underweight - under 18  overweight - 25 to 29 obese - 30 or more Class 1 obesity: BMI of 30.0 to 34 Class 2 obesity: BMI of 35.0 to 39 Class 3 obesity: BMI of 40.0 to 49 Super Morbid Obesity: BMI 50-59 Super-super Morbid Obesity: BMI 60+ Healthy nutrition and physical activity advised as adjunct to other disease management and risk reduction treatments Continue Ozempic outpatient     DVT prophylaxis: heparin  IV fluids: no continuous IV fluids  Nutrition: cardiac/carb Central lines / other devices: none  Code Status: FULL CODE ACP documentation reviewed:  none on file in [facility]  TOC needs: SNF rehab Medical barriers to dispo: IV abx  while here but medically stable             Subjective / Brief ROS:  Patient reports feeling good today Pain controlled  Feeling weak, worked some w/ PT/OT and ok w/ rehab, feeling a bit better today  Denies CP/SOB.   Family Communication: none at this time     Objective Findings:  Vitals:   08/19/24 1959 08/20/24 0504 08/20/24 0739 08/20/24 1555  BP: (!) 124/53 (!) 136/55 (!) 135/57 (!) 132/55  Pulse: (!) 59 70 66 (!) 57  Resp: 16 16 17 17   Temp: 98.3 F (36.8 C) 98.6 F (37 C) 98.4 F (36.9 C) 97.6 F (36.4 C)  TempSrc:      SpO2: 100% 96% 96% 100%  Weight:  105.8 kg    Height:        Intake/Output Summary (Last 24 hours) at 08/20/2024 1714 Last data filed at 08/20/2024 1449 Gross per 24 hour  Intake 869.89 ml  Output 300 ml  Net 569.89 ml   Filed Weights   08/18/24 0500 08/19/24 0500 08/20/24 0504  Weight: 112.3 kg 106 kg 105.8 kg    Examination:  Physical Exam Constitutional:      General: She is not in acute distress. Cardiovascular:     Rate and Rhythm: Normal rate and regular rhythm.   Pulmonary:     Effort: Pulmonary effort is normal.     Breath sounds: Normal breath sounds.  Skin:    General: Skin is warm and dry.     Findings: No erythema.     Comments: Skin erythema receding compared to photos on admission  Neurological:     Mental Status: She is alert and oriented to person, place, and time. Mental status is at baseline.  Psychiatric:        Mood and Affect: Mood normal.        Behavior: Behavior normal.          Scheduled Medications:   aspirin  EC  81 mg Oral Daily   collagenase    Topical Daily   ferrous sulfate   325 mg Oral QHS   heparin  injection (subcutaneous)  5,000 Units Subcutaneous Q8H   pregabalin   100 mg Oral BID  propranolol  ER  60 mg Oral Daily   rosuvastatin   10 mg Oral Daily    Continuous Infusions:  linezolid  (ZYVOX ) IV 600 mg (08/20/24 1544)    PRN Medications:  acetaminophen , dextrose , hydrALAZINE, HYDROmorphone  (DILAUDID ) injection, ondansetron  (ZOFRAN ) IV, oxyCODONE , polyethylene glycol, senna-docusate  Antimicrobials from admission:  Anti-infectives (From admission, onward)    Start     Dose/Rate Route Frequency Ordered Stop   08/17/24 2300  vancomycin  (VANCOREADY) IVPB 750 mg/150 mL  Status:  Discontinued        750 mg 150 mL/hr over 60 Minutes Intravenous Every 24 hours 08/16/24 2129 08/17/24 1016   08/17/24 2200  linezolid  (ZYVOX ) IVPB 600 mg        600 mg 300 mL/hr over 60 Minutes Intravenous Every 12 hours 08/17/24 1016     08/17/24 1400  piperacillin-tazobactam (ZOSYN) IVPB 3.375 g  Status:  Discontinued        3.375 g 12.5 mL/hr over 240 Minutes Intravenous Every 8 hours 08/17/24 1007 08/20/24 1140   08/16/24 2230  vancomycin  (VANCOREADY) IVPB 1250 mg/250 mL        1,250 mg 166.7 mL/hr over 90 Minutes Intravenous  Once 08/16/24 2128 08/17/24 0054   08/16/24 2200  metroNIDAZOLE (FLAGYL) tablet 500 mg  Status:  Discontinued        500 mg Oral Every 12 hours 08/16/24 2113 08/17/24 1007   08/16/24 2130   cefTRIAXone  (ROCEPHIN ) 2 g in sodium chloride  0.9 % 100 mL IVPB  Status:  Discontinued        2 g 200 mL/hr over 30 Minutes Intravenous Every 24 hours 08/16/24 2112 08/17/24 1007   08/16/24 2045  vancomycin  (VANCOCIN ) IVPB 1000 mg/200 mL premix        1,000 mg 200 mL/hr over 60 Minutes Intravenous  Once 08/16/24 2038 08/16/24 2324           Data Reviewed:  I have personally reviewed the following...  CBC: Recent Labs  Lab 08/16/24 1523 08/17/24 0632 08/19/24 0503  WBC 9.8 6.7 4.7  NEUTROABS 8.2*  --   --   HGB 11.6* 9.7* 9.2*  HCT 36.1 29.3* 28.9*  MCV 95.8 92.7 93.8  PLT 268 225 201   Basic Metabolic Panel: Recent Labs  Lab 08/16/24 1523 08/17/24 0632 08/19/24 0503  NA 134* 134* 138  K 4.3 3.8 3.6  CL 100 106 106  CO2 21* 19* 22  GLUCOSE 191* 176* 115*  BUN 25* 25* 18  CREATININE 1.85* 1.71* 1.35*  CALCIUM  9.7 8.6* 9.0   GFR: Estimated Creatinine Clearance: 50.8 mL/min (A) (by C-G formula based on SCr of 1.35 mg/dL (H)). Liver Function Tests: Recent Labs  Lab 08/16/24 1523  AST 19  ALT 13  ALKPHOS 70  BILITOT 0.8  PROT 7.5  ALBUMIN 3.9   No results for input(s): LIPASE, AMYLASE in the last 168 hours. No results for input(s): AMMONIA in the last 168 hours. Coagulation Profile: Recent Labs  Lab 08/16/24 1523  INR 1.0   Cardiac Enzymes: No results for input(s): CKTOTAL, CKMB, CKMBINDEX, TROPONINI in the last 168 hours. BNP (last 3 results) No results for input(s): PROBNP in the last 8760 hours. HbA1C: No results for input(s): HGBA1C in the last 72 hours. CBG: Recent Labs  Lab 08/17/24 1418 08/17/24 1754 08/17/24 2130 08/18/24 0818 08/18/24 1136  GLUCAP 108* 70 121* 126* 190*   Lipid Profile: No results for input(s): CHOL, HDL, LDLCALC, TRIG, CHOLHDL, LDLDIRECT in the last 72 hours. Thyroid  Function Tests:  No results for input(s): TSH, T4TOTAL, FREET4, T3FREE, THYROIDAB in the last 72  hours. Anemia Panel: No results for input(s): VITAMINB12, FOLATE, FERRITIN, TIBC, IRON, RETICCTPCT in the last 72 hours. Most Recent Urinalysis On File:     Component Value Date/Time   COLORURINE YELLOW (A) 08/16/2024 2204   APPEARANCEUR HAZY (A) 08/16/2024 2204   LABSPEC 1.016 08/16/2024 2204   PHURINE 5.0 08/16/2024 2204   GLUCOSEU 50 (A) 08/16/2024 2204   HGBUR NEGATIVE 08/16/2024 2204   BILIRUBINUR NEGATIVE 08/16/2024 2204   BILIRUBINUR neg 05/17/2019 1356   KETONESUR NEGATIVE 08/16/2024 2204   PROTEINUR NEGATIVE 08/16/2024 2204   UROBILINOGEN 0.2 05/17/2019 1356   NITRITE NEGATIVE 08/16/2024 2204   LEUKOCYTESUR TRACE (A) 08/16/2024 2204   Sepsis Labs: @LABRCNTIP (procalcitonin:4,lacticidven:4) Microbiology: Recent Results (from the past 240 hours)  Culture, blood (Routine x 2)     Status: None (Preliminary result)   Collection Time: 08/16/24  3:23 PM   Specimen: BLOOD  Result Value Ref Range Status   Specimen Description BLOOD BLOOD RIGHT ARM  Final   Special Requests   Final    BOTTLES DRAWN AEROBIC AND ANAEROBIC Blood Culture results may not be optimal due to an inadequate volume of blood received in culture bottles   Culture   Final    NO GROWTH 4 DAYS Performed at [facility], [address] [place], [place] [address]    Report Status PENDING  Incomplete  Culture, blood (Routine x 2)     Status: None (Preliminary result)   Collection Time: 08/16/24  6:28 PM   Specimen: BLOOD  Result Value Ref Range Status   Specimen Description BLOOD BLOOD LEFT ARM  Final   Special Requests   Final    BOTTLES DRAWN AEROBIC AND ANAEROBIC Blood Culture results may not be optimal due to an inadequate volume of blood received in culture bottles   Culture   Final    NO GROWTH 4 DAYS Performed at [facility], [address] [place], [place] [address]    Report Status PENDING  Incomplete  Aerobic/Anaerobic Culture w Gram Stain (surgical/deep wound)      Status: None (Preliminary result)   Collection Time: 08/17/24  1:45 PM   Specimen: Foot, Right; Tissue  Result Value Ref Range Status   Specimen Description   Final    FOOT Performed at [facility], [address] [place], [place] [address]    Special Requests   Final    NONE Performed at [facility], [address] [place], [place] [address]    Gram Stain   Final    RARE WBC PRESENT, PREDOMINANTLY PMN RARE GRAM POSITIVE COCCI Performed at [facility], [address] [place], [place] [address]    Culture   Final    RARE METHICILLIN RESISTANT STAPHYLOCOCCUS AUREUS NO ANAEROBES ISOLATED; CULTURE IN PROGRESS FOR 5 DAYS    Report Status PENDING  Incomplete   Organism ID, Bacteria METHICILLIN RESISTANT STAPHYLOCOCCUS AUREUS  Final      Susceptibility   Methicillin resistant staphylococcus aureus - MIC*    CIPROFLOXACIN >=8 RESISTANT Resistant     ERYTHROMYCIN >=8 RESISTANT Resistant     GENTAMICIN <=0.5 SENSITIVE Sensitive     OXACILLIN >=4 RESISTANT Resistant     TETRACYCLINE <=1 SENSITIVE Sensitive     VANCOMYCIN  <=0.5 SENSITIVE Sensitive     TRIMETH /SULFA  160 RESISTANT Resistant     CLINDAMYCIN <=0.25 SENSITIVE Sensitive     RIFAMPIN <=0.5 SENSITIVE Sensitive  Inducible Clindamycin NEGATIVE Sensitive     LINEZOLID  2 SENSITIVE Sensitive     * RARE METHICILLIN RESISTANT STAPHYLOCOCCUS AUREUS  Aerobic/Anaerobic Culture w Gram Stain (surgical/deep wound)     Status: None (Preliminary result)   Collection Time: 08/17/24  1:47 PM   Specimen: Bone; Tissue  Result Value Ref Range Status   Specimen Description   Final    BONE Performed at [facility], [address], [place], [place] [address]    Special Requests   Final    NONE Performed at [facility], [address] [place], [place] [address]    Gram Stain   Final    WBC PRESENT, PREDOMINANTLY PMN RARE GRAM POSITIVE COCCI Performed at [facility],  [address], [place], [place] [address]    Culture   Final    RARE METHICILLIN RESISTANT STAPHYLOCOCCUS AUREUS NO ANAEROBES ISOLATED; CULTURE IN PROGRESS FOR 5 DAYS    Report Status PENDING  Incomplete   Organism ID, Bacteria METHICILLIN RESISTANT STAPHYLOCOCCUS AUREUS  Final      Susceptibility   Methicillin resistant staphylococcus aureus - MIC*    CIPROFLOXACIN >=8 RESISTANT Resistant     ERYTHROMYCIN >=8 RESISTANT Resistant     GENTAMICIN <=0.5 SENSITIVE Sensitive     OXACILLIN >=4 RESISTANT Resistant     TETRACYCLINE <=1 SENSITIVE Sensitive     VANCOMYCIN  <=0.5 SENSITIVE Sensitive     TRIMETH /SULFA  160 RESISTANT Resistant     CLINDAMYCIN <=0.25 SENSITIVE Sensitive     RIFAMPIN <=0.5 SENSITIVE Sensitive     Inducible Clindamycin NEGATIVE Sensitive     LINEZOLID  2 SENSITIVE Sensitive     * RARE METHICILLIN RESISTANT STAPHYLOCOCCUS AUREUS  Aerobic/Anaerobic Culture w Gram Stain (surgical/deep wound)     Status: None (Preliminary result)   Collection Time: 08/17/24  1:48 PM   Specimen: Foot, Right; Tissue  Result Value Ref Range Status   Specimen Description   Final    FOOT Performed at [facility], [address], [place], [place] [address]    Special Requests   Final    NONE Performed at [facility], [address] [place], [place] [address]    Gram Stain   Final    FEW WBC PRESENT, PREDOMINANTLY MONONUCLEAR RARE GRAM POSITIVE COCCI Performed at [facility], [address] [place], [place] [address]    Culture   Final    FEW STAPHYLOCOCCUS AUREUS SUSCEPTIBILITIES PERFORMED ON PREVIOUS CULTURE WITHIN THE LAST 5 DAYS. NO ANAEROBES ISOLATED; CULTURE IN PROGRESS FOR 5 DAYS    Report Status PENDING  Incomplete      Radiology Studies last 3 days: US  Venous Img Lower Unilateral Right (DVT) Result Date: 08/18/2024 CLINICAL DATA:  Right lower extremity swelling status post recent foot surgery. EXAM: RIGHT LOWER EXTREMITY VENOUS DOPPLER  ULTRASOUND TECHNIQUE: Gray-scale sonography with compression, as well as color and duplex ultrasound, were performed to evaluate the deep venous system(s) from the level of the common femoral vein through the popliteal and proximal calf veins. COMPARISON:  None Available. FINDINGS: VENOUS Normal compressibility of the common femoral, superficial femoral, and popliteal veins, as well as the visualized calf veins. Visualized portions of profunda femoral vein and great saphenous vein unremarkable. No filling defects to suggest DVT on grayscale or color Doppler imaging. Doppler waveforms show normal direction of venous flow, normal respiratory plasticity and response to augmentation. Limited views of the contralateral common femoral vein are unremarkable. OTHER None. Limitations: none IMPRESSION: Negative.  Electronically Signed   By: Wilkie Lent M.D.   On: 08/18/2024 08:35   DG Foot 2 Views Right Result Date: 08/17/2024 EXAM: 2 VIEW(S) XRAY OF THE RIGHT FOOT 08/17/2024 03:01:16 PM COMPARISON: 08/16/2024 CLINICAL HISTORY: Post-operative state [id number]. Status post Right foot surgery. FINDINGS: BONES AND JOINTS: Interval resection of the proximal fifth metatarsal. No acute fracture. No joint dislocation. SOFT TISSUES: Lateral skin staples in place. Soft tissue swelling and soft tissue gas. IMPRESSION: 1. Interval resection of the proximal fifth metatarsal. 2. Soft tissue swelling and soft tissue gas compatible with postsurgical changes. Electronically signed by: Donnice Mania MD 08/17/2024 09:38 PM EDT RP Workstation: [id number]   CT FOOT RIGHT WO CONTRAST Result Date: 08/16/2024 EXAM: CT RIGHT FOOT, WITHOUT IV CONTRAST 08/16/2024 10:25:54 PM TECHNIQUE: Axial images were acquired through the right foot without IV contrast. Reformatted images were reviewed. Automated exposure control, iterative reconstruction, and/or weight based adjustment of the mA/kV was utilized to reduce the radiation dose to as low as  reasonably achievable. COMPARISON: Right foot series today, right foot series 07/21/2024. No prior cross-sectional imaging of the right foot for comparison. CLINICAL HISTORY: Foot swelling, diabetic, osteomyelitis suspected, xray done. FINDINGS: BONES AND JOINTS: There is a mildly displaced transverse 5th metatarsal base fracture, first seen on 07/21/2024, age indeterminate with no healing callus. There are no further fractures. . There is a shallow depression in the overlying skin adjacent to the fracture; this could represent a small skin wound, but there is no underlying bony destruction, although an early infection in the fracture bed is difficult to exclude. There is mild hallux valgus with otherwise normal intraosseous alignment. There is mild midfoot arthrosis, early degenerative change in the toe joints, and mild nonerosive arthrosis in the 1st MTP joint, but no evidence of an erosive arthropathy, Charcot's arthropathy, or other significant focal bone abnormality or focal pathologic process. There are small noninflammatory-appearing dorsal and plantar calcaneal spurs. The ankle mortise is symmetric. No dislocation. The joint spaces are normal, except for the described arthrosis. SOFT TISSUES: There is moderate generalized edema in the ankle and foot. Partial atrophy in the plantar intrinsic foot musculature, which may be seen with diabetes. Area tendons are not well evaluated with CT, but they are grossly intact. There are no focal fluid collections either in the subcutaneous plane or deep spaces. No soft tissue gas is seen. Shallow depression in the overlying skin adjacent to the 5th metatarsal base fracture, as described in BONES AND JOINTS. IMPRESSION: 1. Mildly displaced transverse 5th metatarsal base fracture without healing callus, first seen on 07/21/2024 films and unchanged in appearance ; early fracture bed infection is difficult to exclude, but there is no destructive bone lesion . 2. Moderate  generalized edema in the ankle and foot without focal fluid collection or soft tissue gas. 3. Partial atrophy of the plantar intrinsic foot musculature, which may be seen with diabetes. Electronically signed by: Francis Quam MD 08/16/2024 10:52 PM EDT RP Workstation: [id number]         Laneta Blunt, DO [facility] 08/20/2024, 5:14 PM    Dictation software may have been used to generate the above note. Typos may occur and escape review in typed/dictated notes. Please contact Dr Blunt directly for clarity if needed.  Staff may message me via secure chat in Epic  but this may not receive an immediate response,  please page me for urgent matters!  If 7PM-7AM, please contact night coverage [email or web address]

## 2024-08-20 NOTE — Progress Notes (Signed)
 Physical Therapy Treatment Patient Details Name: Melody Brooks MRN: [id number] DOB: [birth year] Today's Date: 08/20/2024   History of Present Illness Melody Brooks is a 68 y.o. female with medical history significant of DM, HTN, HLD, dCHF, CKD-3b, angioedema, chronic pain, anemia, obesity, s/p spinal cord stimulator placement, who presents with right foot wound with infection. Now s/p I&D R foot 10/17.    PT Comments  Pt sitting EOB and ready for session.  She stands x 2 to RW with min a x 1 and good ability to maintain NWB in standing with post op shoe.  She does inquire about knee scooter.  One is brought to room and while she is able to get on/off with min a x 2 for safety and guidance she is very hesitant to step more than a few steps forward and back.  Excessive forward and right lean while attempting to offload L foot.  Education is provided and she opts not to continue with it at this time but is encouraged to revisit it as she becomes more confident in mobility in rehab.  Discussed other ways to transfer in and out of WC or BSC and she is able to demo squat pivot transfer to [facility] which she stated does feel safer than RW at this time.  Son has built ramp to access home upon discharge.  She stated she feels rehab is still the best option at this time as she remains hesitant with general weakness and is concerned about her husbands ability to safely assist her at this time he has his own issues.  Pt did feel more hopeful after session and hopes she will be able to progress to increased confidence in mobility to allow for a shorter rehab stay before transition home.   If plan is discharge home, recommend the following: A little help with walking and/or transfers;A little help with bathing/dressing/bathroom;Assist for transportation;Help with stairs or ramp for entrance   Can travel by private vehicle        Equipment Recommendations  BSC/3in1    Recommendations for Other Services        Precautions / Restrictions Precautions Precautions: Fall Recall of Precautions/Restrictions: Intact Restrictions Weight Bearing Restrictions Per Provider Order: Yes RLE Weight Bearing Per Provider Order: Non weight bearing Other Position/Activity Restrictions: post-op shoe on R.     Mobility  Bed Mobility Overal bed mobility: Modified Independent               Patient Response: Cooperative  Transfers Overall transfer level: Needs assistance Equipment used: Rolling walker (2 wheels) Transfers: Sit to/from Stand, Bed to chair/wheelchair/BSC Sit to Stand: Min assist     Squat pivot transfers: Contact guard assist          Ambulation/Gait Ambulation/Gait assistance: Min assist, +2 physical assistance Gait Distance (Feet): 2 Feet Assistive device: Knee scooter Gait Pattern/deviations: Step-to pattern Gait velocity: dec     General Gait Details: few steps forward and back - hesitant,   Stairs             Wheelchair Mobility     Tilt Bed Tilt Bed Patient Response: Cooperative  Modified Rankin (Stroke Patients Only)       Balance Overall balance assessment: Needs assistance Sitting-balance support: Feet supported Sitting balance-Leahy Scale: Good     Standing balance support: Bilateral upper extremity supported Standing balance-Leahy Scale: Fair  Communication Communication Communication: No apparent difficulties  Cognition Arousal: Alert Behavior During Therapy: WFL for tasks assessed/performed   PT - Cognitive impairments: No apparent impairments                       PT - Cognition Comments: pleasant and agreeable to session Following commands: Intact      Cueing Cueing Techniques: Verbal cues  Exercises      General Comments        Pertinent Vitals/Pain Pain Assessment Pain Assessment: Faces Faces Pain Scale: Hurts a little bit Pain Location: R foot Pain Descriptors /  Indicators: Aching Pain Intervention(s): Limited activity within patient's tolerance, Monitored during session, Repositioned    Home Living                          Prior Function            PT Goals (current goals can now be found in the care plan section) Progress towards PT goals: Progressing toward goals    Frequency    Min 2X/week      PT Plan      Co-evaluation              AM-PAC PT 6 Clicks Mobility   Outcome Measure  Help needed turning from your back to your side while in a flat bed without using bedrails?: None Help needed moving from lying on your back to sitting on the side of a flat bed without using bedrails?: A Little Help needed moving to and from a bed to a chair (including a wheelchair)?: A Little Help needed standing up from a chair using your arms (e.g., wheelchair or bedside chair)?: A Little Help needed to walk in hospital room?: A Lot Help needed climbing 3-5 steps with a railing? : Total 6 Click Score: 16    End of Session Equipment Utilized During Treatment: Gait belt Activity Tolerance: Patient tolerated treatment well Patient left: in bed;with bed alarm set;with call bell/phone within reach Nurse Communication: Mobility status PT Visit Diagnosis: Muscle weakness (generalized) (M62.81);Difficulty in walking, not elsewhere classified (R26.2);Unsteadiness on feet (R26.81)     Time: 8587-8541 PT Time Calculation (min) (ACUTE ONLY): 46 min  Charges:    $Therapeutic Activity: 38-52 mins PT General Charges $$ ACUTE PT VISIT: 1 Visit                   Lauraine Gills, PTA 08/20/24, 3:12 PM

## 2024-08-21 ENCOUNTER — Ambulatory Visit: Admitting: Neurosurgery

## 2024-08-21 DIAGNOSIS — E11628 Type 2 diabetes mellitus with other skin complications: Secondary | ICD-10-CM | POA: Diagnosis not present

## 2024-08-21 DIAGNOSIS — L089 Local infection of the skin and subcutaneous tissue, unspecified: Secondary | ICD-10-CM | POA: Diagnosis not present

## 2024-08-21 LAB — CULTURE, BLOOD (ROUTINE X 2)
Culture: NO GROWTH
Culture: NO GROWTH

## 2024-08-21 LAB — SURGICAL PATHOLOGY

## 2024-08-21 LAB — GLUCOSE, CAPILLARY: Glucose-Capillary: 143 mg/dL — ABNORMAL HIGH (ref 70–99)

## 2024-08-21 MED ORDER — DAPAGLIFLOZIN PROPANEDIOL 5 MG PO TABS
5.0000 mg | ORAL_TABLET | Freq: Every day | ORAL | Status: DC
  Administered 2024-08-22 – 2024-08-23 (×2): 5 mg via ORAL
  Filled 2024-08-21 (×2): qty 1

## 2024-08-21 MED ORDER — TOPIRAMATE 25 MG PO TABS
50.0000 mg | ORAL_TABLET | Freq: Two times a day (BID) | ORAL | Status: DC
  Administered 2024-08-21 – 2024-08-23 (×4): 50 mg via ORAL
  Filled 2024-08-21 (×5): qty 2

## 2024-08-21 MED ORDER — SPIRONOLACTONE 25 MG PO TABS
25.0000 mg | ORAL_TABLET | Freq: Every day | ORAL | Status: DC
  Administered 2024-08-22 – 2024-08-23 (×2): 25 mg via ORAL
  Filled 2024-08-21 (×2): qty 1

## 2024-08-21 MED ORDER — FUROSEMIDE 20 MG PO TABS
20.0000 mg | ORAL_TABLET | Freq: Every day | ORAL | Status: DC
  Administered 2024-08-22 – 2024-08-23 (×2): 20 mg via ORAL
  Filled 2024-08-21 (×2): qty 1

## 2024-08-21 MED ORDER — LINEZOLID 600 MG PO TABS
600.0000 mg | ORAL_TABLET | Freq: Two times a day (BID) | ORAL | Status: DC
Start: 2024-08-21 — End: 2024-08-28
  Administered 2024-08-21 – 2024-08-23 (×4): 600 mg via ORAL
  Filled 2024-08-21 (×5): qty 1

## 2024-08-21 NOTE — Progress Notes (Signed)
 PROGRESS NOTE    Melody Brooks   FMW:[id number] DOB: 07-21-1956  DOA: 08/16/2024 Date of Service: 08/21/24 which is hospital day 5  PCP: Auston Reyes BIRCH, MD    Hospital course / significant events:   HPI: Melody Brooks is a 68 y.o. female with medical history significant of DM, HTN, HLD, dCHF, CKD-3b, angioedema, chronic pain, anemia, obesity, s/p spinal cord stimulator placement, who presents with right foot wound with concern for infection. She was recently hospitalized from 9/17 - 9/22 for spinal cord stimulator surgical site infection - superficial, I&D abscess 09/21, no concern for infection of the actual device. Cultures were obtained growing MRSA as of 9/21.  Pt was discharged on linezolid  for 1 week. Pt also had chronic right foot wound with infection for more than 4 months so was discharged on Levaquin  in addition to linezolid . Pt states that her spinal cord stimulator surgical site infection has healed, but her right foot pain has been worsening. Pt was seen at wound clinic 10/16 for right foot wound, concern for worsening infection. Her right food redness and swelling are spreading up to lower leg, concerning for osteomyelitis, and then sent to ED for further evaluation and treatment.   10/16: to ED. R foot XR (+)mildly displaced avulsion fracture of the base of the fifth metatarsal with nonunion. R foot CT (+)confirms 5th MT fx unchanged, soft tissue generalized edema, no focal fluid collection, no sq gas, no bone lesion  10/17: to OR w/ podiatry, I&D and complex closure.  10/18: cultures no concerns thus far (on specimens from R foot / bone: rare GPC, few WBC), pathology pending. Cellulitis is improving, continue abx. PT/OT recs for SNF, TOC consult order placed.  10/19: await cultures and placement 10/20: (+)MRSA, narrow abx to linezolid . If good margins on bone path will just need po abx      Consultants:  Podiatry   Procedures/Surgeries: 08/17/24 Incision and  drainage with fifth metatarsal base resection, right foot; Complex closure of wound right lateral midfoot approximately 5 x 1 cm - Dr Malvin       ASSESSMENT & PLAN:   Diabetic foot infection on right foot with wound (+)MRSA Cellulitis RLE - improving  NO sepsis  Ruled out DVT  (+)MRSA, narrow abx to linezolid . There were clear margins on bone path - will just need po abx  Podiatry consult - to OR 10/17 I&D Once patient is discharged to rehab facility recommend dressing changes every other day: Paint incision with Betadine, Xeroform, ABD, large Kerlix, Ace wrap. Podiatry has signed off. F/u in office 3 weeks post discharge for suture and staple removal.   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   Restart farxiga, Lasix  and spironolactone  which were held d/t AKI  I&O   Type 2 diabetes mellitus with peripheral neuropathy  Recent A1c 5.7, well-controlled.   Patient is taking glipizide , Janumet , Ozempic SSI here for strict glc control - hypoglycemia so dc SSI  Continue glc checks for now,  can restart insulin  if hyperglycemic   Anemia, iron deficiency:  Hemoglobin stable 11.6 on admission --> 9.7 HD2 likely dilutional d/t IV fluids. Continue iron supplement Monitor CBC   Acute renal failure superimposed on stage 3b chronic kidney disease - resolved/improved Patient baseline creatinine 1.46 on 07/23/2024.   Now at basline Monitor BMP resume Lasix  and  spironolactone     Class 2 obesity based on BMI: Body mass index is 36.74 kg/m.[name] Significantly low or high BMI is associated with higher medical risk.  Underweight - under 18  overweight - 25 to 29 obese - 30 or more Class 1 obesity: BMI of 30.0 to 34 Class 2 obesity: BMI of 35.0 to 39 Class 3 obesity: BMI of 40.0 to 49 Super Morbid Obesity: BMI 50-59 Super-super Morbid Obesity: BMI 60+ Healthy nutrition and physical activity advised as adjunct to other disease management and risk reduction treatments Continue Ozempic outpatient     DVT prophylaxis: heparin  IV fluids: no continuous IV fluids  Nutrition: cardiac/carb Central lines / other devices: none  Code Status: FULL CODE ACP documentation reviewed:  none on file in [facility]  TOC needs: SNF rehab Medical barriers to dispo: IV abx  while here but medically stable             Subjective / Brief ROS:  Patient reports feeling good today Pain controlled  Feeling weak, worked some w/ PT/OT and ok w/ rehab, feeling a bit better today  Denies CP/SOB.   Family Communication: none at this time     Objective Findings:  Vitals:   08/20/24 1555 08/20/24 1957 08/21/24 0516 08/21/24 0800  BP: (!) 132/55 (!) 140/51 (!) 144/59 (!) 139/58  Pulse: (!) 57 77 70 64  Resp: 17 17 17 17   Temp: 97.6 F (36.4 C) 97.6 F (36.4 C) 97.6 F (36.4 C) 97.6 F (36.4 C)  TempSrc:  Oral Oral Oral  SpO2: 100% 100% 98% 98%  Weight:      Height:        Intake/Output Summary (Last 24 hours) at 08/21/2024 1706 Last data filed at 08/21/2024 0955 Gross per 24 hour  Intake 780 ml  Output 450 ml  Net 330 ml   Filed Weights   08/18/24 0500 08/19/24 0500 08/20/24 0504  Weight: 112.3 kg 106 kg 105.8 kg    Examination:  Physical Exam Constitutional:      General: She is not in acute distress. Cardiovascular:     Rate and Rhythm: Normal rate and regular rhythm.  Pulmonary:     Effort: Pulmonary effort is normal.     Breath  sounds: Normal breath sounds.  Skin:    General: Skin is warm and dry.     Findings: No erythema.     Comments: Skin erythema receding compared to photos on admission  Neurological:     Mental Status: She is alert and oriented to person, place, and time. Mental status is at baseline.  Psychiatric:        Mood and Affect: Mood normal.        Behavior: Behavior normal.          Scheduled Medications:   aspirin  EC  81 mg Oral Daily   collagenase    Topical Daily   [START ON 08/22/2024] dapagliflozin propanediol  5 mg Oral Daily   ferrous sulfate   325 mg Oral QHS   [START ON 08/22/2024] furosemide   20 mg Oral Daily   heparin  injection (subcutaneous)  5,000 Units Subcutaneous Q8H   linezolid   600 mg Oral Q12H   pregabalin   100 mg Oral BID   propranolol  ER  60 mg Oral Daily   rosuvastatin   10 mg Oral Daily   [START ON 08/22/2024] spironolactone   25 mg Oral Daily   topiramate   50 mg Oral BID    Continuous Infusions:    PRN Medications:  acetaminophen , dextrose , hydrALAZINE, HYDROmorphone  (DILAUDID ) injection, ondansetron  (ZOFRAN ) IV, oxyCODONE , polyethylene glycol, senna-docusate  Antimicrobials from admission:  Anti-infectives (From admission, onward)    Start     Dose/Rate Route Frequency Ordered Stop   08/21/24 1600  linezolid  (ZYVOX ) tablet 600 mg        600 mg Oral Every 12 hours 08/21/24 1019     08/17/24 2300  vancomycin  (VANCOREADY) IVPB 750 mg/150 mL  Status:  Discontinued        750 mg 150 mL/hr over 60 Minutes Intravenous Every 24 hours 08/16/24 2129 08/17/24 1016   08/17/24 2200  linezolid  (ZYVOX ) IVPB 600 mg  Status:  Discontinued        600 mg 300 mL/hr over 60 Minutes Intravenous Every 12 hours 08/17/24 1016 08/21/24 1019   08/17/24 1400  piperacillin-tazobactam (ZOSYN) IVPB 3.375 g  Status:  Discontinued        3.375 g 12.5 mL/hr over 240 Minutes Intravenous Every 8 hours 08/17/24 1007 08/20/24 1140   08/16/24 2230  vancomycin  (VANCOREADY) IVPB 1250  mg/250 mL        1,250 mg 166.7 mL/hr over 90 Minutes Intravenous  Once 08/16/24 2128 08/17/24 0054   08/16/24 2200  metroNIDAZOLE (FLAGYL) tablet 500 mg  Status:  Discontinued        500 mg Oral Every 12 hours 08/16/24 2113 08/17/24 1007   08/16/24 2130  cefTRIAXone  (ROCEPHIN ) 2 g in sodium chloride  0.9 % 100 mL IVPB  Status:  Discontinued        2 g 200 mL/hr over 30 Minutes Intravenous Every 24 hours 08/16/24 2112 08/17/24 1007   08/16/24 2045  vancomycin  (VANCOCIN ) IVPB 1000 mg/200 mL premix        1,000 mg 200 mL/hr over 60 Minutes Intravenous  Once 08/16/24 2038 08/16/24 2324           Data Reviewed:  I have personally reviewed the following...  CBC: Recent Labs  Lab 08/16/24 1523 08/17/24 0632 08/19/24 0503  WBC 9.8 6.7 4.7  NEUTROABS 8.2*  --   --   HGB 11.6* 9.7* 9.2*  HCT 36.1 29.3* 28.9*  MCV 95.8 92.7 93.8  PLT 268 225 201   Basic Metabolic Panel: Recent Labs  Lab 08/16/24 1523 08/17/24 0632 08/19/24 0503  Melody 134* 134* 138  K 4.3 3.8 3.6  CL 100 106 106  CO2 21* 19* 22  GLUCOSE 191* 176* 115*  BUN 25* 25* 18  CREATININE 1.85* 1.71* 1.35*  CALCIUM  9.7 8.6* 9.0   GFR: Estimated Creatinine Clearance: 50.8 mL/min (A) (by C-G formula based on SCr of 1.35 mg/dL (H)). Liver Function Tests: Recent Labs  Lab 08/16/24 1523  AST 19  ALT 13  ALKPHOS 70  BILITOT 0.8  PROT 7.5  ALBUMIN 3.9   No results for input(s): LIPASE, AMYLASE in the last 168 hours. No results for input(s): AMMONIA in the last 168 hours. Coagulation Profile: Recent Labs  Lab 08/16/24 1523  INR 1.0   Cardiac Enzymes: No results for input(s): CKTOTAL, CKMB, CKMBINDEX, TROPONINI in the last 168 hours. BNP (last 3 results) No results for input(s): PROBNP in the last 8760  hours. HbA1C: No results for input(s): HGBA1C in the last 72 hours. CBG: Recent Labs  Lab 08/17/24 1418 08/17/24 1754 08/17/24 2130 08/18/24 0818 08/18/24 1136  GLUCAP 108* 70 121*  126* 190*   Lipid Profile: No results for input(s): CHOL, HDL, LDLCALC, TRIG, CHOLHDL, LDLDIRECT in the last 72 hours. Thyroid  Function Tests: No results for input(s): TSH, T4TOTAL, FREET4, T3FREE, THYROIDAB in the last 72 hours. Anemia Panel: No results for input(s): VITAMINB12, FOLATE, FERRITIN, TIBC, IRON, RETICCTPCT in the last 72 hours. Most Recent Urinalysis On File:     Component Value Date/Time   COLORURINE YELLOW (A) 08/16/2024 2204   APPEARANCEUR HAZY (A) 08/16/2024 2204   LABSPEC 1.016 08/16/2024 2204   PHURINE 5.0 08/16/2024 2204   GLUCOSEU 50 (A) 08/16/2024 2204   HGBUR NEGATIVE 08/16/2024 2204   BILIRUBINUR NEGATIVE 08/16/2024 2204   BILIRUBINUR neg 05/17/2019 1356   KETONESUR NEGATIVE 08/16/2024 2204   PROTEINUR NEGATIVE 08/16/2024 2204   UROBILINOGEN 0.2 05/17/2019 1356   NITRITE NEGATIVE 08/16/2024 2204   LEUKOCYTESUR TRACE (A) 08/16/2024 2204   Sepsis Labs: @LABRCNTIP (procalcitonin:4,lacticidven:4) Microbiology: Recent Results (from the past 240 hours)  Culture, blood (Routine x 2)     Status: None   Collection Time: 08/16/24  3:23 PM   Specimen: BLOOD  Result Value Ref Range Status   Specimen Description BLOOD BLOOD RIGHT ARM  Final   Special Requests   Final    BOTTLES DRAWN AEROBIC AND ANAEROBIC Blood Culture results may not be optimal due to an inadequate volume of blood received in culture bottles   Culture   Final    NO GROWTH 5 DAYS Performed at [facility], [address] [place], [place] [address]    Report Status 08/21/2024 FINAL  Final  Culture, blood (Routine x 2)     Status: None   Collection Time: 08/16/24  6:28 PM   Specimen: BLOOD  Result Value Ref Range Status   Specimen Description BLOOD BLOOD LEFT ARM  Final   Special Requests   Final    BOTTLES DRAWN AEROBIC AND ANAEROBIC Blood Culture results may not be optimal due to an inadequate volume of blood received in culture bottles   Culture    Final    NO GROWTH 5 DAYS Performed at [facility], [address] [place], [place] [address]    Report Status 08/21/2024 FINAL  Final  Aerobic/Anaerobic Culture w Gram Stain (surgical/deep wound)     Status: None (Preliminary result)   Collection Time: 08/17/24  1:45 PM   Specimen: Foot, Right; Tissue  Result Value Ref Range Status   Specimen Description   Final    FOOT Performed at [facility], [address], [place], [place] [address]    Special Requests   Final    NONE Performed at [facility], [address] [place], [place] [address]    Gram Stain   Final    RARE WBC PRESENT, PREDOMINANTLY PMN RARE GRAM POSITIVE COCCI Performed at [facility], [address] [place], [place] [address]    Culture   Final    RARE METHICILLIN RESISTANT STAPHYLOCOCCUS AUREUS NO ANAEROBES ISOLATED; CULTURE IN PROGRESS FOR 5 DAYS    Report Status PENDING  Incomplete   Organism ID, Bacteria METHICILLIN RESISTANT STAPHYLOCOCCUS AUREUS  Final      Susceptibility   Methicillin resistant staphylococcus aureus - MIC*    CIPROFLOXACIN >=8 RESISTANT Resistant     ERYTHROMYCIN >=8 RESISTANT Resistant  GENTAMICIN <=0.5 SENSITIVE Sensitive     OXACILLIN >=4 RESISTANT Resistant     TETRACYCLINE <=1 SENSITIVE Sensitive     VANCOMYCIN  <=0.5 SENSITIVE Sensitive     TRIMETH /SULFA  160 RESISTANT Resistant     CLINDAMYCIN <=0.25 SENSITIVE Sensitive     RIFAMPIN <=0.5 SENSITIVE Sensitive     Inducible Clindamycin NEGATIVE Sensitive     LINEZOLID  2 SENSITIVE Sensitive     * RARE METHICILLIN RESISTANT STAPHYLOCOCCUS AUREUS  Aerobic/Anaerobic Culture w Gram Stain (surgical/deep wound)     Status: None (Preliminary result)   Collection Time: 08/17/24  1:47 PM   Specimen: Bone; Tissue  Result Value Ref Range Status   Specimen Description   Final    BONE Performed at [facility], [address], [place], [place] [address]    Special Requests   Final     NONE Performed at [facility], [address], [place], [place] [address]    Gram Stain   Final    WBC PRESENT, PREDOMINANTLY PMN RARE GRAM POSITIVE COCCI Performed at [facility], [address], [place], [place] [address]    Culture   Final    RARE METHICILLIN RESISTANT STAPHYLOCOCCUS AUREUS NO ANAEROBES ISOLATED; CULTURE IN PROGRESS FOR 5 DAYS    Report Status PENDING  Incomplete   Organism ID, Bacteria METHICILLIN RESISTANT STAPHYLOCOCCUS AUREUS  Final      Susceptibility   Methicillin resistant staphylococcus aureus - MIC*    CIPROFLOXACIN >=8 RESISTANT Resistant     ERYTHROMYCIN >=8 RESISTANT Resistant     GENTAMICIN <=0.5 SENSITIVE Sensitive     OXACILLIN >=4 RESISTANT Resistant     TETRACYCLINE <=1 SENSITIVE Sensitive     VANCOMYCIN  <=0.5 SENSITIVE Sensitive     TRIMETH /SULFA  160 RESISTANT Resistant     CLINDAMYCIN <=0.25 SENSITIVE Sensitive     RIFAMPIN <=0.5 SENSITIVE Sensitive     Inducible Clindamycin NEGATIVE Sensitive     LINEZOLID  2 SENSITIVE Sensitive     * RARE METHICILLIN RESISTANT STAPHYLOCOCCUS AUREUS  Aerobic/Anaerobic Culture w Gram Stain (surgical/deep wound)     Status: None (Preliminary result)   Collection Time: 08/17/24  1:48 PM   Specimen: Foot, Right; Tissue  Result Value Ref Range Status   Specimen Description   Final    FOOT Performed at [facility], [address], [place], [place] [address]    Special Requests   Final    NONE Performed at [facility], [address] [place], [place] [address]    Gram Stain   Final    FEW WBC PRESENT, PREDOMINANTLY MONONUCLEAR RARE GRAM POSITIVE COCCI Performed at [facility], [address], [place], [place] [address]    Culture   Final    FEW STAPHYLOCOCCUS AUREUS SUSCEPTIBILITIES PERFORMED ON PREVIOUS CULTURE WITHIN THE LAST 5 DAYS. NO ANAEROBES ISOLATED; CULTURE IN PROGRESS FOR 5 DAYS    Report Status PENDING  Incomplete      Radiology Studies  last 3 days: US  Venous Img Lower Unilateral Right (DVT) Result Date: 08/18/2024 CLINICAL DATA:  Right lower extremity swelling status post recent foot surgery. EXAM: RIGHT LOWER EXTREMITY VENOUS DOPPLER ULTRASOUND TECHNIQUE: Gray-scale sonography with compression, as well as color and duplex ultrasound, were performed to evaluate the deep venous system(s) from the level of the common femoral vein through the popliteal and proximal calf veins. COMPARISON:  None Available. FINDINGS: VENOUS Normal compressibility of the common femoral, superficial femoral, and popliteal veins, as well as the visualized  calf veins. Visualized portions of profunda femoral vein and great saphenous vein unremarkable. No filling defects to suggest DVT on grayscale or color Doppler imaging. Doppler waveforms show normal direction of venous flow, normal respiratory plasticity and response to augmentation. Limited views of the contralateral common femoral vein are unremarkable. OTHER None. Limitations: none IMPRESSION: Negative. Electronically Signed   By: Wilkie Lent M.D.   On: 08/18/2024 08:35         Andras Grunewald, DO [facility] 08/21/2024, 5:06 PM    Dictation software may have been used to generate the above note. Typos may occur and escape review in typed/dictated notes. Please contact Dr Marsa directly for clarity if needed.  Staff may message me via secure chat in Epic  but this may not receive an immediate response,  please page me for urgent matters!  If 7PM-7AM, please contact night coverage [email or web address]

## 2024-08-21 NOTE — TOC Initial Note (Signed)
 Transition of Care [facility]) - Initial/Assessment Note    Patient Details  Name: Melody Brooks MRN: [id number] Date of Birth: [birth year]  Transition of Care [facility]) CM/SW Contact:    Alvaro Louder, LCSW Phone Number: 08/21/2024, 9:14 AM  Clinical Narrative:     Per Chart review patient from Home PCP is Reyes Costa LCSWA Faxed out information to SNF's in [place]. LCSWA will present Facilities to patient at the bedside.             TOC to follow for discharge       Patient Goals and CMS Choice            Expected Discharge Plan and Services                                              Prior Living Arrangements/Services                       Activities of Daily Living   ADL Screening (condition at time of admission) Independently performs ADLs?: No Does the patient have a NEW difficulty with bathing/dressing/toileting/self-feeding that is expected to last >3 days?: Yes (Initiates electronic notice to provider for possible OT consult) Does the patient have a NEW difficulty with getting in/out of bed, walking, or climbing stairs that is expected to last >3 days?: Yes (Initiates electronic notice to provider for possible PT consult) Does the patient have a NEW difficulty with communication that is expected to last >3 days?: No Is the patient deaf or have difficulty hearing?: No Does the patient have difficulty seeing, even when wearing glasses/contacts?: No Does the patient have difficulty concentrating, remembering, or making decisions?: No  Permission Sought/Granted                  Emotional Assessment              Admission diagnosis:  Wound infection [T14.8XXA, L08.9] Elevated lactic acid level [R79.89] Diabetic foot infection (HCC) [Z88.371, L08.9] Cellulitis, unspecified cellulitis site [L03.90] Patient Active Problem List   Diagnosis Date Noted   Pyogenic inflammation of bone (HCC) 08/17/2024   Pathological fracture of  right foot 08/17/2024   Diabetic foot infection (HCC) 08/16/2024   Chronic diastolic CHF (congestive heart failure) (HCC) 08/16/2024   Acute renal failure superimposed on stage 3b chronic kidney disease (HCC) 08/16/2024   Cellulitis of right lower extremity 08/16/2024   Surgical site infection 07/19/2024   Cellulitis of back 07/18/2024   Dyslipidemia 07/18/2024   Type 2 diabetes mellitus with peripheral neuropathy (HCC) 07/18/2024   Ulcerated, foot, right, limited to breakdown of skin (HCC) 07/06/2024   Acute encephalopathy 06/26/2024   Stage 3a chronic kidney disease (CKD) (HCC) 06/26/2024   AKI (acute kidney injury) 06/26/2024   Visual hallucination 06/26/2024   Pain 03/20/2024   Chronic painful diabetic neuropathy (HCC) 03/20/2024   Spinal stenosis 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 03/20/2024   Lumbar facet arthropathy 03/20/2024   Chronic pain syndrome 03/20/2024   Basal cell carcinoma (BCC) 11/06/2019   Chronic urticaria 09/10/2019   Type 2 diabetes mellitus (HCC) 09/10/2019   Dermatochalasis of eyelid 08/14/2018   Ptosis, both eyelids 08/14/2018   Visual field defect 08/14/2018   Morbid obesity with BMI of 40.0-44.9, adult (HCC) 03/10/2018   Carotid stenosis 12/16/2017   Accumulation of fluid in tissues  07/08/2015   Arterial fibromuscular dysplasia 07/08/2015   Acid reflux 07/08/2015   Left arm pain 03/12/2015   Obesity 03/12/2015   Essential hypertension 03/12/2015   Bursitis, trochanteric 12/12/2014   Lumbosacral radiculitis 12/11/2014   Angio-edema 05/24/2013   Internal hemorrhoids with complication 04/07/2012   Hyperlipemia 12/04/2010   PALPITATIONS 12/04/2010   DYSPNEA 12/04/2010   ABNORMAL EKG 12/04/2010   Inflammation with thrombosis 04/23/2010   Anemia, iron deficiency 03/06/2008   B12 deficiency 03/06/2008   Avitaminosis D 03/06/2008   Insomnia 03/01/2008   Idiopathic peripheral neuropathy 12/23/2003   Diabetes mellitus with polyneuropathy  (HCC) 07/17/2003   Idiopathic scoliosis 07/30/2002   PCP:  Auston Reyes BIRCH, MD Pharmacy:   [facility] [address] [place] - [address] [place] [address] Phone: [phone] Fax: [phone]  [facility] [address] [place] [address] Phone: [phone] Fax: [phone]     Social Drivers of Health (SDOH) Social History: SDOH Screenings   Food Insecurity: No Food Insecurity (08/17/2024)  Housing: High Risk (08/17/2024)  Transportation Needs: No Transportation Needs (08/17/2024)  Utilities: Not At Risk (08/17/2024)  Alcohol  Screen: Low Risk  (07/28/2020)  Depression (PHQ2-9): Low Risk  (05/07/2024)  Financial Resource Strain: Low Risk  (04/17/2024)   Received from [facility]  Social Connections: Unknown (08/17/2024)  Tobacco Use: Low Risk  (08/17/2024)   SDOH Interventions:     Readmission Risk Interventions     No data to display

## 2024-08-21 NOTE — NC FL2 (Signed)
 [place]  MEDICAID FL2 LEVEL OF CARE FORM     IDENTIFICATION  Patient Name: Melody Brooks Birthdate: 1956/04/11 Sex: female Admission Date (Current Location): 08/16/2024  [facility] and [place] Number:  Chiropodist and Address:  [facility], [address], [place], [place] [address]      Provider Number: [id number]  Attending Physician Name and Address:  Marsa Edelman, DO  Relative Name and Phone Number:       Current Level of Care: Hospital Recommended Level of Care: Skilled Nursing Facility Prior Approval Number:    Date Approved/Denied:   PASRR Number: [id number]  Discharge Plan: SNF    Current Diagnoses: Patient Active Problem List   Diagnosis Date Noted   Pyogenic inflammation of bone (HCC) 08/17/2024   Pathological fracture of right foot 08/17/2024   Diabetic foot infection (HCC) 08/16/2024   Chronic diastolic CHF (congestive heart failure) (HCC) 08/16/2024   Acute renal failure superimposed on stage 3b chronic kidney disease (HCC) 08/16/2024   Cellulitis of right lower extremity 08/16/2024   Surgical site infection 07/19/2024   Cellulitis of back 07/18/2024   Dyslipidemia 07/18/2024   Type 2 diabetes mellitus with peripheral neuropathy (HCC) 07/18/2024   Ulcerated, foot, right, limited to breakdown of skin (HCC) 07/06/2024   Acute encephalopathy 06/26/2024   Stage 3a chronic kidney disease (CKD) (HCC) 06/26/2024   AKI (acute kidney injury) 06/26/2024   Visual hallucination 06/26/2024   Pain 03/20/2024   Chronic painful diabetic neuropathy (HCC) 03/20/2024   Spinal stenosis 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 03/20/2024   Lumbar facet arthropathy 03/20/2024   Chronic pain syndrome 03/20/2024   Basal cell carcinoma (BCC) 11/06/2019   Chronic urticaria 09/10/2019   Type 2 diabetes mellitus (HCC) 09/10/2019   Dermatochalasis of eyelid 08/14/2018   Ptosis, both eyelids 08/14/2018   Visual field defect  08/14/2018   Morbid obesity with BMI of 40.0-44.9, adult (HCC) 03/10/2018   Carotid stenosis 12/16/2017   Accumulation of fluid in tissues 07/08/2015   Arterial fibromuscular dysplasia 07/08/2015   Acid reflux 07/08/2015   Left arm pain 03/12/2015   Obesity 03/12/2015   Essential hypertension 03/12/2015   Bursitis, trochanteric 12/12/2014   Lumbosacral radiculitis 12/11/2014   Angio-edema 05/24/2013   Internal hemorrhoids with complication 04/07/2012   Hyperlipemia 12/04/2010   PALPITATIONS 12/04/2010   DYSPNEA 12/04/2010   ABNORMAL EKG 12/04/2010   Inflammation with thrombosis 04/23/2010   Anemia, iron deficiency 03/06/2008   B12 deficiency 03/06/2008   Avitaminosis D 03/06/2008   Insomnia 03/01/2008   Idiopathic peripheral neuropathy 12/23/2003   Diabetes mellitus with polyneuropathy (HCC) 07/17/2003   Idiopathic scoliosis 07/30/2002    Orientation RESPIRATION BLADDER Height & Weight     Self, Time, Situation, Place  Normal Continent Weight: 233 lb 4 oz (105.8 kg) Height:  5' 8 (172.7 cm)  BEHAVIORAL SYMPTOMS/MOOD NEUROLOGICAL BOWEL NUTRITION STATUS      Continent Diet  AMBULATORY STATUS COMMUNICATION OF NEEDS Skin   Limited Assist Verbally Surgical wounds (Surgical closed incision right hip)                       Personal Care Assistance Level of Assistance  Bathing, Feeding, Dressing Bathing Assistance: Limited assistance Feeding assistance: Independent Dressing Assistance: Limited assistance     Functional Limitations Info  Sight, Hearing, Speech Sight Info: Adequate Hearing Info: Adequate Speech Info: Adequate    SPECIAL CARE FACTORS FREQUENCY  PT (By licensed PT), OT (By licensed OT)  PT Frequency: 5x/week OT Frequency: 5x/week            Contractures      Additional Factors Info  Code Status, Allergies Code Status Info: Full Allergies Info: Lisinopril           Current Medications (08/21/2024):  This is the current hospital  active medication list Current Facility-Administered Medications  Medication Dose Route Frequency Provider Last Rate Last Admin   acetaminophen  (TYLENOL ) tablet 650 mg  650 mg Oral Q6H PRN Niu, Xilin, MD   650 mg at 08/21/24 0215   aspirin  EC tablet 81 mg  81 mg Oral Daily Niu, Xilin, MD   81 mg at 08/21/24 0900   collagenase  (SANTYL ) ointment   Topical Daily Niu, Xilin, MD   Given at 08/17/24 9074   dextrose  50 % solution 50 mL  1 ampule Intravenous PRN Alexander, Natalie, DO       ferrous sulfate  tablet 325 mg  325 mg Oral QHS Niu, Xilin, MD   325 mg at 08/20/24 2133   heparin  injection 5,000 Units  5,000 Units Subcutaneous Q8H Alexander, Natalie, DO   5,000 Units at 08/21/24 9486   hydrALAZINE (APRESOLINE) injection 5 mg  5 mg Intravenous Q2H PRN Niu, Xilin, MD       HYDROmorphone  (DILAUDID ) injection 0.5 mg  0.5 mg Intravenous Q4H PRN Alexander, Natalie, DO   0.5 mg at 08/18/24 0546   linezolid  (ZYVOX ) IVPB 600 mg  600 mg Intravenous Q12H Marsa Edelman, DO 300 mL/hr at 08/21/24 0213 600 mg at 08/21/24 9786   ondansetron  (ZOFRAN ) injection 4 mg  4 mg Intravenous Q8H PRN Niu, Xilin, MD       oxyCODONE  (Oxy IR/ROXICODONE ) immediate release tablet 5-10 mg  5-10 mg Oral Q4H PRN Alexander, Natalie, DO   5 mg at 08/19/24 2247   polyethylene glycol (MIRALAX  / GLYCOLAX ) packet 17 g  17 g Oral BID PRN Alexander, Natalie, DO   17 g at 08/19/24 1158   pregabalin  (LYRICA ) capsule 100 mg  100 mg Oral BID Niu, Xilin, MD   100 mg at 08/21/24 0900   propranolol  ER (INDERAL  LA) 24 hr capsule 60 mg  60 mg Oral Daily Alexander, Natalie, DO   60 mg at 08/21/24 0901   rosuvastatin  (CRESTOR ) tablet 10 mg  10 mg Oral Daily Niu, Xilin, MD   10 mg at 08/21/24 0900   senna-docusate (Senokot-S) tablet 2 tablet  2 tablet Oral Daily PRN Alexander, Natalie, DO   2 tablet at 08/19/24 1158     Discharge Medications: Please see discharge summary for a list of discharge medications.  Relevant Imaging  Results:  Relevant Lab Results:   Additional Information SSN: [id number]  Alvaro Louder, LCSW

## 2024-08-21 NOTE — Progress Notes (Signed)
 PHARMACIST - PHYSICIAN COMMUNICATION DR:   [place] CONCERNING: Antibiotic IV to Oral Route Change Policy  RECOMMENDATION: This patient is receiving Linezolid  by the intravenous route.  Based on criteria approved by the Pharmacy and Therapeutics Committee, the antibiotic(s) is/are being converted to the equivalent oral dose form(s).   DESCRIPTION: These criteria include: Patient being treated for a respiratory tract infection, urinary tract infection, cellulitis or clostridium difficile associated diarrhea if on metronidazole The patient is not neutropenic and does not exhibit a GI malabsorption state The patient is eating (either orally or via tube) and/or has been taking other orally administered medications for a least 24 hours The patient is improving clinically and has a Tmax < 100.5  If you have questions about this conversion, please contact the Pharmacy Department   [x]   [phone] )  [facility]

## 2024-08-21 NOTE — Progress Notes (Signed)
 Occupational Therapy Treatment Patient Details Name: Melody Brooks MRN: [id number] DOB: 19-Jul-1956 Today's Date: 08/21/2024   History of present illness Melody Brooks is a 68 y.o. female with medical history significant of DM, HTN, HLD, dCHF, CKD-3b, angioedema, chronic pain, anemia, obesity, s/p spinal cord stimulator placement, who presents with right foot wound with infection. Now s/p I&D R foot 10/17.   OT comments  Chart reviewed to date, pt greeted semi supine in bed, agreeable to OT tx session targeting improving functional activity tolerance in prep for ADL tasks. Pt requires MIN A for STS with RW from elevated height, MIN A for SPT to bedside chair. MOD A required for LB dressing due to pt reported spinal precautions. Provided red theraband for BUE strengthening with good carry over of demo of exercises. Pt is left in chair, all needs met. OT will continue to follow.       If plan is discharge home, recommend the following:  A lot of help with walking and/or transfers;A lot of help with bathing/dressing/bathroom;Assistance with cooking/housework;Assist for transportation;Help with stairs or ramp for entrance   Equipment Recommendations  Wheelchair (measurements OT)    Recommendations for Other Services      Precautions / Restrictions Precautions Precautions: Fall Restrictions Weight Bearing Restrictions Per Provider Order: Yes RLE Weight Bearing Per Provider Order: Non weight bearing Other Position/Activity Restrictions: post-op shoe on R.       Mobility Bed Mobility Overal bed mobility: Modified Independent                  Transfers Overall transfer level: Needs assistance Equipment used: Rolling walker (2 wheels) Transfers: Sit to/from Stand Sit to Stand: Min assist, From elevated surface                 Balance Overall balance assessment: Needs assistance Sitting-balance support: Feet supported Sitting balance-Leahy Scale: Good     Standing  balance support: Bilateral upper extremity supported, During functional activity, Reliant on assistive device for balance Standing balance-Leahy Scale: Fair                             ADL either performed or assessed with clinical judgement   ADL Overall ADL's : Needs assistance/impaired Eating/Feeding: Set up;Sitting                   Lower Body Dressing: Minimal assistance Lower Body Dressing Details (indicate cue type and reason): post op shoe- pt reports she is not supposed to bend/lift/twist after spinal implant in Aug 2025 Toilet Transfer: Minimal assistance;Rolling walker (2 wheels) Toilet Transfer Details (indicate cue type and reason): simulated, SPT to beside chair                Extremity/Trunk Assessment              Vision       Perception     Praxis     Communication Communication Communication: No apparent difficulties   Cognition Arousal: Alert Behavior During Therapy: WFL for tasks assessed/performed Cognition: No apparent impairments                               Following commands: Intact        Cueing   Cueing Techniques: Verbal cues  Exercises Other Exercises Other Exercises: provided red theraband with demo on BUE strengthning HEP (chest pull/shoulder flexion), good  carry over    Shoulder Instructions       General Comments vss    Pertinent Vitals/ Pain       Pain Assessment Pain Assessment: No/denies pain  Home Living                                          Prior Functioning/Environment              Frequency  Min 2X/week        Progress Toward Goals  OT Goals(current goals can now be found in the care plan section)  Progress towards OT goals: Progressing toward goals  Acute Rehab OT Goals Time For Goal Achievement: 09/01/24 ADL Goals Pt/caregiver will Perform Home Exercise Program: Both right and left upper extremity;With theraband;Increased strength   Plan      Co-evaluation                 AM-PAC OT 6 Clicks Daily Activity     Outcome Measure   Help from another person eating meals?: None Help from another person taking care of personal grooming?: A Little Help from another person toileting, which includes using toliet, bedpan, or urinal?: A Lot Help from another person bathing (including washing, rinsing, drying)?: A Lot Help from another person to put on and taking off regular upper body clothing?: None Help from another person to put on and taking off regular lower body clothing?: A Lot 6 Click Score: 17    End of Session Equipment Utilized During Treatment: Rolling walker (2 wheels)  OT Visit Diagnosis: Unsteadiness on feet (R26.81);Muscle weakness (generalized) (M62.81)   Activity Tolerance Patient tolerated treatment well   Patient Left in chair;with call bell/phone within reach   Nurse Communication Mobility status        Time: 8687-8668 OT Time Calculation (min): 19 min  Charges: OT General Charges $OT Visit: 1 Visit OT Treatments $Therapeutic Activity: 8-22 mins Therisa Sheffield, OTD OTR/L  08/21/24, 1:55 PM

## 2024-08-22 DIAGNOSIS — L089 Local infection of the skin and subcutaneous tissue, unspecified: Secondary | ICD-10-CM | POA: Diagnosis not present

## 2024-08-22 DIAGNOSIS — E11628 Type 2 diabetes mellitus with other skin complications: Secondary | ICD-10-CM | POA: Diagnosis not present

## 2024-08-22 LAB — AEROBIC/ANAEROBIC CULTURE W GRAM STAIN (SURGICAL/DEEP WOUND)
Culture: NO GROWTH
Culture: NO GROWTH
Culture: NO GROWTH

## 2024-08-22 NOTE — TOC Progression Note (Addendum)
 Transition of Care [facility]) - Progression Note    Patient Details  Name: Melody Brooks MRN: [id number] Date of Birth: 1956/10/23  Transition of Care [facility]) CM/SW Contact  Linet Brash  [place], [place] Phone Number: 08/22/2024, 8:41 AM  Clinical Narrative:    1:43 PM: Auth Approved for patient to admit to [facility] tomorrow [id number] Dates:10/22-10/24/2025 Next Review Date: 08/24/2024    Auth pending for patient to admit to [facility]. Pending AuthID:[id number]   TOC to follow for discharge                     Expected Discharge Plan and Services                                               Social Drivers of Health (SDOH) Interventions SDOH Screenings   Food Insecurity: No Food Insecurity (08/17/2024)  Housing: High Risk (08/17/2024)  Transportation Needs: No Transportation Needs (08/17/2024)  Utilities: Not At Risk (08/17/2024)  Alcohol  Screen: Low Risk  (07/28/2020)  Depression (PHQ2-9): Low Risk  (05/07/2024)  Financial Resource Strain: Low Risk  (04/17/2024)   Received from [facility]  Social Connections: Unknown (08/17/2024)  Tobacco Use: Low Risk  (08/17/2024)    Readmission Risk Interventions     No data to display

## 2024-08-22 NOTE — Progress Notes (Signed)
 Progress Note   Patient: Melody Brooks FMW:[id number] DOB: 11/12/1955 DOA: 08/16/2024     6 DOS: the patient was seen and examined on 08/22/2024   Brief hospital course:   HPI: Melody Brooks is a 68 y.o. female with medical history significant of DM, HTN, HLD, dCHF, CKD-3b, angioedema, chronic pain, anemia, obesity, s/p spinal cord stimulator placement, who presents with right foot wound with concern for infection. She was recently hospitalized from 9/17 - 9/22 for spinal cord stimulator surgical site infection - superficial, I&D abscess 09/21, no concern for infection of the actual device. Cultures were obtained growing MRSA as of 9/21.  Pt was discharged on linezolid  for 1 week. Pt also had chronic right foot wound with infection for more than 4 months so was discharged on Levaquin  in addition to linezolid . Pt states that her spinal cord stimulator surgical site infection has healed, but her right foot pain has been worsening. Pt was seen at wound clinic 10/16 for right foot wound, concern for worsening infection. Her right food redness and swelling are spreading up to lower leg, concerning for osteomyelitis, and then sent to ED for further evaluation and treatment.    10/16: to ED. R foot XR (+)mildly displaced avulsion fracture of the base of the fifth metatarsal with nonunion. R foot CT (+)confirms 5th MT fx unchanged, soft tissue generalized edema, no focal fluid collection, no sq gas, no bone lesion  10/17: to OR w/ podiatry, I&D and complex closure.  10/18: cultures no concerns thus far (on specimens from R foot / bone: rare GPC, few WBC), pathology pending. Cellulitis is improving, continue abx. PT/OT recs for SNF, TOC consult order placed.  10/19: await cultures and placement 10/20: (+)MRSA, narrow abx to linezolid . If good margins on bone path will just need po abx      Consultants:  Podiatry    Procedures/Surgeries: 08/17/24 Incision and drainage with fifth metatarsal base  resection, right foot; Complex closure of wound right lateral midfoot approximately 5 x 1 cm - Dr Malvin   ASSESSMENT & PLAN:   Diabetic foot infection on right foot with wound (+)MRSA Cellulitis RLE - improving  NO sepsis  Ruled out DVT  (+)MRSA, narrow abx to linezolid . There were clear margins on bone path - will just need po abx  Podiatry consult - to OR 10/17 I&D Once patient is discharged to rehab facility recommend dressing changes every other day: Paint incision with Betadine, Xeroform, ABD, large Kerlix, Ace wrap. Podiatry has signed off. F/u in office 3 weeks post discharge for suture and staple removal.    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   Restart farxiga, Lasix  and spironolactone  which were held d/t AKI  I&O   Type 2 diabetes mellitus with peripheral neuropathy  Recent A1c 5.7, well-controlled.   Patient is taking glipizide , Janumet , Ozempic SSI here for strict glc control - hypoglycemia so dc SSI  Continue glc checks for now, can restart insulin  if hyperglycemic   Anemia, iron deficiency:  Hemoglobin stable 11.6 on admission --> 9.7 HD2 likely dilutional d/t IV fluids. Continue iron supplement Monitor CBC   Acute renal failure superimposed on stage 3b chronic kidney disease - resolved/improved Patient baseline creatinine 1.46 on 07/23/2024.   Now at basline Monitor BMP resumed Lasix  and spironolactone       Class 2 obesity based on BMI: Body mass index  is 36.74 kg/m.[name] Continue Ozempic outpatient        DVT prophylaxis: heparin    Code Status: FULL CODE  TOC needs: SNF rehab Medical barriers to dispo: IV abx  while here but medically stable       Subjective / Brief ROS:  Patient denies pain in the foot TOC working on placement   Family Communication: none at this time     Physical Exam Constitutional:      General: She is not in acute distress. Cardiovascular:     Rate and Rhythm: Normal rate and regular rhythm.  Pulmonary:     Effort: Pulmonary effort is normal.     Breath sounds: Normal breath sounds.  Neurological:     Mental Status: She is alert and oriented to person, place, and time. Mental status is at baseline.  Psychiatric:        Mood and Affect: Mood normal.        Behavior: Behavior normal.    Data reviewed:  Vitals:   08/21/24 0800 08/21/24 1939 08/22/24 0425 08/22/24 0814  BP: (!) 139/58 (!) 134/57 (!) 157/61 134/73  Pulse: 64 71 71 72  Resp: 17 16 16 16   Temp: 97.6 F (36.4 C) 97.6 F (36.4 C) (!) 97.5 F (36.4 C) 98.5 F (36.9 C)  TempSrc: Oral   Oral  SpO2: 98% 100% 97% 100%  Weight:   109.3 kg   Height:          Latest Ref Rng & Units 08/19/2024    5:03 AM 08/17/2024    6:32 AM 08/16/2024    3:23 PM  CBC  WBC 4.0 - 10.5 K/uL 4.7  6.7  9.8   Hemoglobin 12.0 - 15.0 g/dL 9.2  9.7  88.3   Hematocrit 36.0 - 46.0 % 28.9  29.3  36.1   Platelets 150 - 400 K/uL 201  225  268        Latest Ref Rng & Units 08/19/2024    5:03 AM 08/17/2024    6:32 AM 08/16/2024    3:23 PM  BMP  Glucose 70 - 99 mg/dL 884  823  808   BUN 8 - 23 mg/dL 18  25  25    Creatinine 0.44 - 1.00 mg/dL 8.64  8.28  8.14   Sodium 135 - 145 mmol/L 138  134  134   Potassium 3.5 - 5.1 mmol/L 3.6  3.8  4.3   Chloride 98 - 111 mmol/L 106  106  100   CO2 22 - 32 mmol/L 22  19  21    Calcium  8.9 - 10.3 mg/dL 9.0  8.6  9.7      Author: Drue ONEIDA Potter, MD 08/22/2024 2:45 PM  For on call review [email or web address]

## 2024-08-23 ENCOUNTER — Other Ambulatory Visit: Payer: Self-pay

## 2024-08-23 DIAGNOSIS — L089 Local infection of the skin and subcutaneous tissue, unspecified: Secondary | ICD-10-CM | POA: Diagnosis not present

## 2024-08-23 DIAGNOSIS — E11628 Type 2 diabetes mellitus with other skin complications: Secondary | ICD-10-CM | POA: Diagnosis not present

## 2024-08-23 LAB — CBC WITH DIFFERENTIAL/PLATELET
Abs Immature Granulocytes: 0.08 K/uL — ABNORMAL HIGH (ref 0.00–0.07)
Basophils Absolute: 0.1 K/uL (ref 0.0–0.1)
Basophils Relative: 1 %
Eosinophils Absolute: 0.3 K/uL (ref 0.0–0.5)
Eosinophils Relative: 4 %
HCT: 32.7 % — ABNORMAL LOW (ref 36.0–46.0)
Hemoglobin: 10.5 g/dL — ABNORMAL LOW (ref 12.0–15.0)
Immature Granulocytes: 1 %
Lymphocytes Relative: 43 %
Lymphs Abs: 3.1 K/uL (ref 0.7–4.0)
MCH: 30.1 pg (ref 26.0–34.0)
MCHC: 32.1 g/dL (ref 30.0–36.0)
MCV: 93.7 fL (ref 80.0–100.0)
Monocytes Absolute: 0.5 K/uL (ref 0.1–1.0)
Monocytes Relative: 7 %
Neutro Abs: 3.1 K/uL (ref 1.7–7.7)
Neutrophils Relative %: 44 %
Platelets: 272 K/uL (ref 150–400)
RBC: 3.49 MIL/uL — ABNORMAL LOW (ref 3.87–5.11)
RDW: 14.4 % (ref 11.5–15.5)
WBC: 7.1 K/uL (ref 4.0–10.5)
nRBC: 0 % (ref 0.0–0.2)

## 2024-08-23 LAB — BASIC METABOLIC PANEL WITH GFR
Anion gap: 11 (ref 5–15)
BUN: 19 mg/dL (ref 8–23)
CO2: 24 mmol/L (ref 22–32)
Calcium: 9.3 mg/dL (ref 8.9–10.3)
Chloride: 107 mmol/L (ref 98–111)
Creatinine, Ser: 1.27 mg/dL — ABNORMAL HIGH (ref 0.44–1.00)
GFR, Estimated: 46 mL/min — ABNORMAL LOW (ref >60)
Glucose, Bld: 125 mg/dL — ABNORMAL HIGH (ref 70–99)
Potassium: 3.6 mmol/L (ref 3.5–5.1)
Sodium: 142 mmol/L (ref 135–145)

## 2024-08-23 MED ORDER — LINEZOLID 600 MG PO TABS: 600.0000 mg | ORAL_TABLET | Freq: Two times a day (BID) | ORAL | Status: AC

## 2024-08-23 MED ORDER — POLYETHYLENE GLYCOL 3350 17 G PO PACK: 17.0000 g | PACK | Freq: Two times a day (BID) | ORAL | Status: AC | PRN

## 2024-08-23 MED ORDER — POLYETHYLENE GLYCOL 3350 17 G PO PACK
17.0000 g | PACK | Freq: Two times a day (BID) | ORAL | 0 refills | Status: DC | PRN
  Filled 2024-08-23: qty 14, 7d supply, fill #0

## 2024-08-23 MED ORDER — LINEZOLID 600 MG PO TABS
600.0000 mg | ORAL_TABLET | Freq: Two times a day (BID) | ORAL | 0 refills | Status: DC
  Filled 2024-08-23: qty 12, 6d supply, fill #0

## 2024-08-23 NOTE — Discharge Summary (Signed)
 Physician Discharge Summary   Patient: Melody Brooks MRN: [id number] DOB: 12-08-1955  Admit date:     08/16/2024  Discharge date: 08/23/24  Discharge Physician: Drue ONEIDA Potter   PCP: Auston Reyes BIRCH, MD   Recommendations at discharge:  Follow-up with PCP and podiatry  Discharge Diagnoses: Principal Problem:   Diabetic foot infection (HCC) Active Problems:   Cellulitis of right lower extremity   Essential hypertension   Hyperlipemia   Chronic diastolic CHF (congestive heart failure) (HCC)   Type 2 diabetes mellitus with peripheral neuropathy (HCC)   Anemia, iron deficiency   Acute renal failure superimposed on stage 3b chronic kidney disease (HCC)   Obesity   Pyogenic inflammation of bone (HCC)   Pathological fracture of right foot  Resolved Problems:   * No resolved hospital problems. *  Hospital Course: Melody Brooks is a 68 y.o. female with medical history significant of DM, HTN, HLD, dCHF, CKD-3b, angioedema, chronic pain, anemia, obesity, s/p spinal cord stimulator placement, who presents with right foot wound with concern for infection. She was recently hospitalized from 9/17 - 9/22 for spinal cord stimulator surgical site infection - superficial, I&D abscess 09/21, no concern for infection of the actual device. Cultures were obtained growing MRSA as of 9/21.  Pt was discharged on linezolid  for 1 week. Pt also had chronic right foot wound with infection for more than 4 months so was discharged on Levaquin  in addition to linezolid . Pt states that her spinal cord stimulator surgical site infection has healed, but her right foot pain has been worsening. Pt was seen at wound clinic 10/16 for right foot wound, concern for worsening infection. Her right food redness and swelling are spreading up to lower leg, concerning for osteomyelitis, and then sent to ED for further evaluation and treatment.    10/16: to ED. R foot XR (+)mildly displaced avulsion fracture of the base of the  fifth metatarsal with nonunion. R foot CT (+)confirms 5th MT fx unchanged, soft tissue generalized edema, no focal fluid collection, no sq gas, no bone lesion  10/17: to OR w/ podiatry, I&D and complex closure.  10/18: cultures no concerns thus far (on specimens from R foot / bone: rare GPC, few WBC), pathology pending. Cellulitis is improving, continue abx. PT/OT recs for SNF, TOC consult order placed.  10/19: await cultures and placement 10/20: (+)MRSA, narrow abx to linezolid . If good margins on bone path will just need po abx  10/22: Wound margins are clean showing no signs of osteomyelitis 10/23: Patient will complete a total of 10 days of Zyvox . Will be discharged to rehab today     Consultants:  Podiatry    Procedures/Surgeries: 08/17/24 Incision and drainage with fifth metatarsal base resection, right foot; Complex closure of wound right lateral midfoot approximately 5 x 1 cm - Dr Malvin    ASSESSMENT & PLAN:   Diabetic foot infection on right foot with wound (+)MRSA Cellulitis RLE - improving  NO sepsis  Ruled out DVT  (+)MRSA, narrow abx to linezolid . There were clear margins on bone path - will just need po abx  Podiatry consult - to OR 10/17 I&D Once patient is discharged to rehab facility recommend dressing changes every other day: Paint incision with Betadine, Xeroform, ABD, large Kerlix, Ace wrap. Podiatry follow-up in 3 weeks post discharge for suture and staple removal.    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   RestartED farxiga, Lasix  and spironolactone  which were held d/t AKI  I&O   Type 2 diabetes mellitus with peripheral neuropathy  Recent A1c 5.7, well-controlled.   Continue home medication   Anemia, iron deficiency:  Continue iron supplementation   Acute renal failure superimposed on stage 3b chronic kidney disease - resolved/improved Patient baseline creatinine 1.46 on 07/23/2024.   Now at basline Monitor BMP resumed Lasix  and spironolactone       Class 2 obesity based on BMI: Body mass index is 36.74 kg/m.[name] Continue Ozempic outpatient      Consultants: Podiatry Procedures performed: As mentioned above Disposition: Skilled nursing facility Diet recommendation:  Cardiac and Carb modified diet DISCHARGE MEDICATION: Allergies as of 08/23/2024       Reactions   Lisinopril Hives, Itching, Swelling, Rash   Angioedema        Medication List     STOP taking these medications    pioglitazone  45 MG tablet Commonly known as: ACTOS        TAKE these medications    acetaminophen  500 MG tablet Commonly known as: TYLENOL  Take 1,000 mg by mouth every 6 (six) hours as needed for moderate pain.   amLODipine  5 MG tablet Commonly known as: NORVASC  Take 5 mg by mouth daily.   aspirin  81 MG tablet Take 81 mg by mouth daily.   B-12 IJ Inject 1,000 Units as directed every 14 (fourteen) days.   Chlorhexidine  Gluconate 2 % Soln Apply 15 mLs topically daily.   cyclobenzaprine  10 MG tablet Commonly known as: FLEXERIL  Take 10 mg by mouth at bedtime.   dapagliflozin propanediol 5 MG Tabs tablet Commonly known as: FARXIGA Take 5 mg by mouth.   Dialyvite Vitamin D  5000 125 MCG (5000 UT) capsule Generic drug: Cholecalciferol  Take 5,000 Units by mouth daily.   EpiPen  2-Pak 0.3 MG/0.3ML Soaj injection Generic drug: EPINEPHrine  Inject 0.3 mg into the muscle as needed for anaphylaxis.   estrogen  (conjugated)-medroxyprogesterone 0.3-1.5 MG tablet Commonly known as: PREMPRO Take 1 tablet by mouth daily.   ferrous sulfate  325 (65 FE) MG tablet Take 325 mg by mouth at bedtime.   fluticasone -salmeterol 250-50 MCG/ACT Aepb Commonly known as: ADVAIR Inhale 1 puff into the lungs 2 (two) times daily as needed (chest congestion).   furosemide  20 MG tablet Commonly known as: LASIX  Take 20 mg by mouth daily.   glipiZIDE  2.5 MG 24 hr tablet Commonly known as: GLUCOTROL  XL TAKE 1 TABLET BY MOUTH  DAILY WITH BREAKFAST   Janumet  50-1000 MG tablet Generic drug: sitaGLIPtin -metformin  TAKE 1 TABLET BY MOUTH  TWICE DAILY What changed: when to take this   linezolid  600 MG tablet Commonly known as: ZYVOX  Take 1 tablet (600 mg total) by mouth every 12 (twelve) hours for 6 days. What changed: when to take this   LORazepam  2 MG tablet Commonly known as: ATIVAN  Take 2 mg by mouth as needed.   Lyrica  100 MG capsule Generic drug: pregabalin  Take 1 capsule by mouth 2 (two) times daily.   magnesium  oxide 400 (240 Mg) MG tablet Commonly known as: MAG-OX Take 400 mg by mouth daily.  mupirocin  ointment 2 % Commonly known as: BACTROBAN  Apply 1 Application topically 2 (two) times daily.   Ozempic (1 MG/DOSE) 4 MG/3ML Sopn Generic drug: Semaglutide (1 MG/DOSE) Inject 0.75 mLs into the skin once a week.   polyethylene glycol 17 g packet Commonly known as: MIRALAX  / GLYCOLAX  Take 17 g by mouth 2 (two) times daily as needed for mild constipation.   predniSONE  10 MG tablet Commonly known as: DELTASONE  Take 10 mg by mouth as needed.   propranolol  ER 60 MG 24 hr capsule Commonly known as: INDERAL  LA Take 60 mg by mouth daily.   rosuvastatin  10 MG tablet Commonly known as: CRESTOR  Take 10 mg by mouth daily.   Santyl  250 UNIT/GM ointment Generic drug: collagenase  Apply 1 Application topically daily. 1.1 cm x 0.6 cm x 0.3 cm   spironolactone  25 MG tablet Commonly known as:  ALDACTONE  Take 25 mg by mouth daily.   topiramate  50 MG tablet Commonly known as: TOPAMAX  Take 50 mg by mouth 2 (two) times daily.   zinc  gluconate 50 MG tablet Take 50 mg by mouth daily.        Contact information for after-discharge care     Destination     [facility] [place], [place]. [name]   Service: Skilled Nursing Contact information: [address] [place]  [address] [phone]                    Discharge Exam: Filed Weights   08/20/24 0504 08/22/24 0425 08/23/24 0356  Weight: 105.8 kg 109.3 kg 109.4 kg     General: She is not in acute distress. Cardiovascular:     Rate and Rhythm: Normal rate and regular rhythm.  Pulmonary:     Effort: Pulmonary effort is normal.     Breath sounds: Normal breath sounds.  Neurological:     Mental Status: She is alert and oriented to person, place, and time. Mental status is at baseline.  Psychiatric:        Mood and Affect: Mood normal.        Behavior: Behavior normal.  Musculoskeletal: Right foot dressed clean and dry  Condition at discharge: good  The results of significant diagnostics from this hospitalization (including imaging, microbiology, ancillary and laboratory) are listed below for reference.   Imaging Studies: US  Venous Img Lower Unilateral Right (DVT) Result Date: 08/18/2024 CLINICAL DATA:  Right lower extremity swelling status post recent foot surgery. EXAM: RIGHT LOWER EXTREMITY VENOUS DOPPLER ULTRASOUND TECHNIQUE: Gray-scale sonography with compression, as well as color and duplex ultrasound, were performed to evaluate the deep venous system(s) from the level of the common femoral vein through the popliteal and proximal calf veins. COMPARISON:  None Available. FINDINGS: VENOUS Normal compressibility of the common femoral, superficial femoral, and popliteal veins, as well as the visualized calf veins. Visualized portions of profunda femoral vein and great saphenous vein unremarkable. No  filling defects to suggest DVT on grayscale or color Doppler imaging. Doppler waveforms show normal direction of venous flow, normal respiratory plasticity and response to augmentation. Limited views of the contralateral common femoral vein are unremarkable. OTHER None. Limitations: none IMPRESSION: Negative. Electronically Signed   By: Wilkie Lent M.D.   On: 08/18/2024 08:35   DG Foot 2 Views Right Result Date: 08/17/2024 EXAM: 2 VIEW(S) XRAY OF THE RIGHT FOOT 08/17/2024 03:01:16 PM COMPARISON: 08/16/2024 CLINICAL HISTORY: Post-operative state [id number]. Status post Right foot surgery. FINDINGS: BONES AND JOINTS: Interval resection of the proximal fifth metatarsal. No acute  fracture. No joint dislocation. SOFT TISSUES: Lateral skin staples in place. Soft tissue swelling and soft tissue gas. IMPRESSION: 1. Interval resection of the proximal fifth metatarsal. 2. Soft tissue swelling and soft tissue gas compatible with postsurgical changes. Electronically signed by: Donnice Mania MD 08/17/2024 09:38 PM EDT RP Workstation: [id number]   CT FOOT RIGHT WO CONTRAST Result Date: 08/16/2024 EXAM: CT RIGHT FOOT, WITHOUT IV CONTRAST 08/16/2024 10:25:54 PM TECHNIQUE: Axial images were acquired through the right foot without IV contrast. Reformatted images were reviewed. Automated exposure control, iterative reconstruction, and/or weight based adjustment of the mA/kV was utilized to reduce the radiation dose to as low as reasonably achievable. COMPARISON: Right foot series today, right foot series 07/21/2024. No prior cross-sectional imaging of the right foot for comparison. CLINICAL HISTORY: Foot swelling, diabetic, osteomyelitis suspected, xray done. FINDINGS: BONES AND JOINTS: There is a mildly displaced transverse 5th metatarsal base fracture, first seen on 07/21/2024, age indeterminate with no healing callus. There are no further fractures. . There is a shallow depression in the overlying skin adjacent to the  fracture; this could represent a small skin wound, but there is no underlying bony destruction, although an early infection in the fracture bed is difficult to exclude. There is mild hallux valgus with otherwise normal intraosseous alignment. There is mild midfoot arthrosis, early degenerative change in the toe joints, and mild nonerosive arthrosis in the 1st MTP joint, but no evidence of an erosive arthropathy, Charcot's arthropathy, or other significant focal bone abnormality or focal pathologic process. There are small noninflammatory-appearing dorsal and plantar calcaneal spurs. The ankle mortise is symmetric. No dislocation. The joint spaces are normal, except for the described arthrosis. SOFT TISSUES: There is moderate generalized edema in the ankle and foot. Partial atrophy in the plantar intrinsic foot musculature, which may be seen with diabetes. Area tendons are not well evaluated with CT, but they are grossly intact. There are no focal fluid collections either in the subcutaneous plane or deep spaces. No soft tissue gas is seen. Shallow depression in the overlying skin adjacent to the 5th metatarsal base fracture, as described in BONES AND JOINTS. IMPRESSION: 1. Mildly displaced transverse 5th metatarsal base fracture without healing callus, first seen on 07/21/2024 films and unchanged in appearance ; early fracture bed infection is difficult to exclude, but there is no destructive bone lesion . 2. Moderate generalized edema in the ankle and foot without focal fluid collection or soft tissue gas. 3. Partial atrophy of the plantar intrinsic foot musculature, which may be seen with diabetes. Electronically signed by: Francis Quam MD 08/16/2024 10:52 PM EDT RP Workstation: [id number]   DG Foot Complete Right Result Date: 08/16/2024 CLINICAL DATA:  Wound infection. EXAM: RIGHT FOOT COMPLETE - 3+ VIEW COMPARISON:  Radiograph dated 07/21/2024. FINDINGS: Mildly displaced avulsion fracture of the base of the  fifth metatarsal with nonunion. No new fracture. No dislocation. Focal skin irregularity over the lateral foot likely related to dressing. Clinical correlation is recommended. No soft tissue gas. IMPRESSION: Mildly displaced avulsion fracture of the base of the fifth metatarsal with nonunion. Electronically Signed   By: Vanetta Chou M.D.   On: 08/16/2024 16:37    Microbiology: Results for orders placed or performed during the hospital encounter of 08/16/24  Culture, blood (Routine x 2)     Status: None   Collection Time: 08/16/24  3:23 PM   Specimen: BLOOD  Result Value Ref Range Status   Specimen Description BLOOD BLOOD RIGHT ARM  Final   Special  Requests   Final    BOTTLES DRAWN AEROBIC AND ANAEROBIC Blood Culture results may not be optimal due to an inadequate volume of blood received in culture bottles   Culture   Final    NO GROWTH 5 DAYS Performed at [facility], [address] [place], [place] [address]    Report Status 08/21/2024 FINAL  Final  Culture, blood (Routine x 2)     Status: None   Collection Time: 08/16/24  6:28 PM   Specimen: BLOOD  Result Value Ref Range Status   Specimen Description BLOOD BLOOD LEFT ARM  Final   Special Requests   Final    BOTTLES DRAWN AEROBIC AND ANAEROBIC Blood Culture results may not be optimal due to an inadequate volume of blood received in culture bottles   Culture   Final    NO GROWTH 5 DAYS Performed at [facility], [address] [place], [place] [address]    Report Status 08/21/2024 FINAL  Final  Aerobic/Anaerobic Culture w Gram Stain (surgical/deep wound)     Status: None   Collection Time: 08/17/24  1:45 PM   Specimen: Foot, Right; Tissue  Result Value Ref Range Status   Specimen Description   Final    FOOT Performed at [facility], [address], [place], [place] [address]    Special Requests   Final    NONE Performed at [facility], [address] [place], [place]  [address]    Gram Stain   Final    RARE WBC PRESENT, PREDOMINANTLY PMN RARE GRAM POSITIVE COCCI    Culture   Final    RARE METHICILLIN RESISTANT STAPHYLOCOCCUS AUREUS NO ANAEROBES ISOLATED Performed at [facility], [address], [place], [place] [address]    Report Status 08/22/2024 FINAL  Final   Organism ID, Bacteria METHICILLIN RESISTANT STAPHYLOCOCCUS AUREUS  Final      Susceptibility   Methicillin resistant staphylococcus aureus - MIC*    CIPROFLOXACIN >=8 RESISTANT Resistant     ERYTHROMYCIN >=8 RESISTANT Resistant     GENTAMICIN <=0.5 SENSITIVE Sensitive     OXACILLIN >=4 RESISTANT Resistant     TETRACYCLINE <=1 SENSITIVE Sensitive     VANCOMYCIN  <=0.5 SENSITIVE Sensitive     TRIMETH /SULFA  160 RESISTANT Resistant     CLINDAMYCIN <=0.25 SENSITIVE Sensitive     RIFAMPIN <=0.5 SENSITIVE Sensitive     Inducible Clindamycin NEGATIVE Sensitive     LINEZOLID  2 SENSITIVE Sensitive     * RARE METHICILLIN RESISTANT STAPHYLOCOCCUS AUREUS  Aerobic/Anaerobic Culture w Gram Stain (surgical/deep wound)     Status: None   Collection Time: 08/17/24  1:47 PM   Specimen: Bone; Tissue  Result Value Ref Range Status   Specimen Description   Final    BONE Performed at [facility], [address], [place], [place] [address]    Special Requests   Final    NONE Performed at [facility], [address] [place], [place] [address]    Gram Stain   Final    WBC PRESENT, PREDOMINANTLY PMN RARE GRAM POSITIVE COCCI    Culture   Final    RARE METHICILLIN RESISTANT STAPHYLOCOCCUS AUREUS NO ANAEROBES ISOLATED Performed at [facility], [address] [place], [place] [address]    Report Status 08/22/2024 FINAL  Final   Organism ID, Bacteria METHICILLIN RESISTANT STAPHYLOCOCCUS AUREUS  Final      Susceptibility   Methicillin resistant staphylococcus aureus - MIC*  CIPROFLOXACIN >=8 RESISTANT Resistant     ERYTHROMYCIN >=8 RESISTANT Resistant     GENTAMICIN  <=0.5 SENSITIVE Sensitive     OXACILLIN >=4 RESISTANT Resistant     TETRACYCLINE <=1 SENSITIVE Sensitive     VANCOMYCIN  <=0.5 SENSITIVE Sensitive     TRIMETH /SULFA  160 RESISTANT Resistant     CLINDAMYCIN <=0.25 SENSITIVE Sensitive     RIFAMPIN <=0.5 SENSITIVE Sensitive     Inducible Clindamycin NEGATIVE Sensitive     LINEZOLID  2 SENSITIVE Sensitive     * RARE METHICILLIN RESISTANT STAPHYLOCOCCUS AUREUS  Aerobic/Anaerobic Culture w Gram Stain (surgical/deep wound)     Status: None   Collection Time: 08/17/24  1:48 PM   Specimen: Foot, Right; Tissue  Result Value Ref Range Status   Specimen Description   Final    FOOT Performed at [facility], [address], [place], [place] [address]    Special Requests   Final    NONE Performed at [facility], [address] [place], [place] [address]    Gram Stain   Final    FEW WBC PRESENT, PREDOMINANTLY MONONUCLEAR RARE GRAM POSITIVE COCCI    Culture   Final    FEW STAPHYLOCOCCUS AUREUS SUSCEPTIBILITIES PERFORMED ON PREVIOUS CULTURE WITHIN THE LAST 5 DAYS. NO ANAEROBES ISOLATED Performed at [facility], [address] [place], [place] [address]    Report Status 08/22/2024 FINAL  Final    Labs: CBC: Recent Labs  Lab 08/16/24 1523 08/17/24 0632 08/19/24 0503 08/23/24 0429  WBC 9.8 6.7 4.7 7.1  NEUTROABS 8.2*  --   --  3.1  HGB 11.6* 9.7* 9.2* 10.5*  HCT 36.1 29.3* 28.9* 32.7*  MCV 95.8 92.7 93.8 93.7  PLT 268 225 201 272   Basic Metabolic Panel: Recent Labs  Lab 08/16/24 1523 08/17/24 0632 08/19/24 0503 08/23/24 0429  NA 134* 134* 138 142  K 4.3 3.8 3.6 3.6  CL 100 106 106 107  CO2 21* 19* 22 24  GLUCOSE 191* 176* 115* 125*  BUN 25* 25* 18 19  CREATININE 1.85* 1.71* 1.35* 1.27*  CALCIUM  9.7 8.6* 9.0 9.3   Liver Function Tests: Recent Labs  Lab 08/16/24 1523  AST 19  ALT 13  ALKPHOS 70  BILITOT 0.8  PROT 7.5  ALBUMIN 3.9   CBG: Recent Labs  Lab 08/17/24 1754 08/17/24 2130  08/18/24 0818 08/18/24 1136 08/21/24 1943  GLUCAP 70 121* 126* 190* 143*    Discharge time spent:  34 minutes.  Signed: Drue ONEIDA Potter, MD [facility] 08/23/2024

## 2024-08-23 NOTE — TOC Transition Note (Signed)
 Transition of Care [facility]) - Discharge Note   Patient Details  Name: Melody Brooks MRN: [id number] Date of Birth: 31-Dec-1955  [facility]) CM/SW Contact:  Alvaro Louder, LCSW Phone Number: 08/23/2024, 2:12 PM   Clinical Narrative:   LCSWA received insurance approval for patient to admit to SNF [facility]. LCSWA confirmed with MD that patient is stable for discharge. LCSWA notified the patient and they are in agreement with discharge. LCSWA confirmed bed is available at SNF. Transport arranged with [facility] for next available.  RM [id number], Number to call report: [phone]   [facility] Signing off  Final next level of care: Skilled Nursing Facility Barriers to Discharge: No Barriers Identified   Patient Goals and CMS Choice            Discharge Placement              Patient chooses bed at: [facility] [place] Patient to be transferred to facility by: [facility] Name of family member notified: Self Patient and family notified of of transfer: 08/23/24  Discharge Plan and Services Additional resources added to the After Visit Summary for                                       Social Drivers of Health (SDOH) Interventions SDOH Screenings   Food Insecurity: No Food Insecurity (08/17/2024)  Housing: High Risk (08/17/2024)  Transportation Needs: No Transportation Needs (08/17/2024)  Utilities: Not At Risk (08/17/2024)  Alcohol  Screen: Low Risk  (07/28/2020)  Depression (PHQ2-9): Low Risk  (05/07/2024)  Financial Resource Strain: Low Risk  (04/17/2024)   Received from [facility]  Social Connections: Unknown (08/17/2024)  Tobacco Use: Low Risk  (08/17/2024)     Readmission Risk Interventions     No data to display

## 2024-08-23 NOTE — Progress Notes (Signed)
 Discharge criteria met

## 2024-08-23 NOTE — Progress Notes (Signed)
 Report called to nurse Persina at [facility].

## 2024-08-23 NOTE — Plan of Care (Signed)
  Problem: Education: Goal: Ability to describe self-care measures that may prevent or decrease complications (Diabetes Survival Skills Education) will improve Outcome: Progressing   Problem: Fluid Volume: Goal: Ability to maintain a balanced intake and output will improve Outcome: Progressing   Problem: Metabolic: Goal: Ability to maintain appropriate glucose levels will improve Outcome: Progressing   Problem: Nutritional: Goal: Maintenance of adequate nutrition will improve Outcome: Progressing   Problem: Skin Integrity: Goal: Risk for impaired skin integrity will decrease Outcome: Progressing   Problem: Tissue Perfusion: Goal: Adequacy of tissue perfusion will improve Outcome: Progressing   Problem: Clinical Measurements: Goal: Ability to maintain clinical measurements within normal limits will improve Outcome: Progressing

## 2024-08-30 ENCOUNTER — Encounter: Admitting: Podiatry

## 2024-08-31 ENCOUNTER — Encounter: Admitting: Podiatry

## 2024-08-31 ENCOUNTER — Encounter: Payer: Self-pay | Admitting: Podiatry

## 2024-08-31 ENCOUNTER — Ambulatory Visit (INDEPENDENT_AMBULATORY_CARE_PROVIDER_SITE_OTHER)

## 2024-08-31 ENCOUNTER — Ambulatory Visit: Admitting: Podiatry

## 2024-08-31 VITALS — Ht 68.0 in | Wt 241.2 lb

## 2024-08-31 DIAGNOSIS — L97512 Non-pressure chronic ulcer of other part of right foot with fat layer exposed: Secondary | ICD-10-CM

## 2024-08-31 MED ORDER — DOXYCYCLINE HYCLATE 100 MG PO TABS: 100.0000 mg | ORAL_TABLET | Freq: Two times a day (BID) | ORAL | 0 refills | Status: DC

## 2024-09-04 ENCOUNTER — Ambulatory Visit: Admitting: Podiatry

## 2024-09-07 ENCOUNTER — Encounter: Payer: Self-pay | Admitting: Podiatry

## 2024-09-07 ENCOUNTER — Ambulatory Visit (INDEPENDENT_AMBULATORY_CARE_PROVIDER_SITE_OTHER): Admitting: Podiatry

## 2024-09-07 VITALS — Ht 68.0 in | Wt 241.2 lb

## 2024-09-07 DIAGNOSIS — L97512 Non-pressure chronic ulcer of other part of right foot with fat layer exposed: Secondary | ICD-10-CM

## 2024-09-11 NOTE — Progress Notes (Signed)
 Chief Complaint  Patient presents with   Routine Post Op    Pt is here to f/u on right foot after amputation to the 5th metatarsal bone on 10/17 with Dr Malvin, she states everything is going well was discharge from rehab today.    Subjective:  Patient presents today status post partial resection of the fifth metatarsal tubercle right foot secondary to osteomyelitis.  DOS: 08/17/2024.  Inpatient [facility] hospital.  Dr. Marolyn Malvin.  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 pt stated on around October of 2022   Angioedema    Arterial fibromuscular dysplasia 07/08/2015   Arthritis    Cancer (HCC)    Carotid stenosis 12/16/2017   Chronic kidney disease    Chronic pain syndrome 03/20/2024   Chronic urticaria 09/10/2019   Constipation    Diabetes mellitus with polyneuropathy (HCC) 07/17/2003   Fatigue    Fibromuscular dysplasia    Generalized headaches    GERD (gastroesophageal reflux disease)    Hemorrhoids    Hiatal hernia    Hypertension    Idiopathic scoliosis 07/30/2002   Lumbar facet arthropathy 03/20/2024   Lumbosacral radiculitis 12/11/2014   Neuropathy    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   a.) presurgical PCR (+) 06/13/2024 prior to THORACIC LAMINECTOMY FOR SCS IMPLANTATION   Palpitations    Pneumonia    Rectal bleeding    Rectal pain    Spinal stenosis 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 03/20/2024    Past Surgical History:  Procedure Laterality Date   Basal cell cancer  removal     colonoscopy  2010   EXCISION/RELEASE BURSA HIP Right 12/23/2022   Procedure: OPEN EXCISION OF RIGHT TROCHANTERIC BURSA;  Surgeon: Edie Norleen PARAS, MD;  Location: [facility] ORS;  Service: Orthopedics;  Laterality: Right;   EXCISIONAL HEMORRHOIDECTOMY  03/2012   IR ANGIO EXTERNAL CAROTID SEL EXT CAROTID BILAT MOD SED  09/04/2021   IR ANGIO INTRA EXTRACRAN SEL INTERNAL CAROTID BILAT MOD SED  09/04/2021    IR ANGIO VERTEBRAL SEL SUBCLAVIAN INNOMINATE UNI L MOD SED  09/04/2021   IR ANGIO VERTEBRAL SEL VERTEBRAL UNI R MOD SED  09/04/2021   IR US  GUIDE VASC ACCESS RIGHT  09/04/2021   IRRIGATION AND DEBRIDEMENT FOOT Right 08/17/2024   Procedure: IRRIGATION AND DEBRIDEMENT FOOT;  Surgeon: Malvin Marsa FALCON, DPM;  Location: [facility];  Service: Orthopedics/Podiatry;  Laterality: Right;   LUMBAR WOUND DEBRIDEMENT N/A 07/22/2024   Procedure: Wound Washout, IPG site;  Surgeon: Claudene Penne ORN, MD;  Location: [facility] OR[facility];  Service: Neurosurgery;  Laterality: N/A;   right and left eye cataract surgery  2006, 2008   right in 2006 and left 2008   THORACIC LAMINECTOMY FOR SPINAL CORD STIMULATOR N/A 06/22/2024   Procedure: THORACIC LAMINECTOMY FOR SPINAL CORD STIMULATOR;  Surgeon: Clois Fret, MD;  Location: [facility] OR[facility];  Service: Neurosurgery;  Laterality: N/A;  THORACIC LAMINECTOMY FOR SPINAL CORD STIMULATOR PLACEMENT   TRANSMETATARSAL AMPUTATION Right 08/17/2024   Procedure: AMPUTATION, PARTIAL FIFTH METATARSAL;  Surgeon: Malvin Marsa FALCON, DPM;  Location: [facility];  Service: Orthopedics/Podiatry;  Laterality: Right;   TUBAL LIGATION  1995    Allergies  Allergen Reactions   Lisinopril Hives, Itching, Swelling and Rash    Angioedema    RT foot 08/31/2024  Objective/Physical Exam Neurovascular status intact.  Incision well coapted with staples and sutures intact.  No active bleeding noted.  No drainage.  Radiographic Exam RT foot 08/31/2024:  Clean osteotomy at the proximal third of the fifth metatarsal.  Absence of the fifth metatarsal tubercle noted.  No gas within the tissue.  No osseous erosions concerning for residual osteomyelitis  Assessment: 1. s/p excision of fifth metatarsal tubercle right. DOS: 08/17/2024.  Dr. Marolyn Honour.  Inpatient.  [facility]   Plan of Care:  -Patient was evaluated. X-rays reviewed - Dressings changed.  Leave clean dry and intact -NWB RLE postop shoe as  instructed -Refill prescription for doxycycline  100 mg twice daily x 10 days -Return to clinic 1 week    Thresa EMERSON Sar, DPM [facility]  Dr. Thresa EMERSON Sar, DPM    [address] [place], [place] [address]                Office [phone]  Fax [phone]

## 2024-09-17 NOTE — Progress Notes (Signed)
 Chief Complaint  Patient presents with   Routine Post Op    Pt is here to f/u on right foot due to surgery she states everything is going well, little to no pain.    Subjective:  Patient presents today status post partial resection of the fifth metatarsal tubercle right foot secondary to osteomyelitis.  DOS: 08/17/2024.  Inpatient [facility] hospital.  Dr. Marolyn Honour.  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 pt stated on around October of 2022   Angioedema    Arterial fibromuscular dysplasia 07/08/2015   Arthritis    Cancer (HCC)    Carotid stenosis 12/16/2017   Chronic kidney disease    Chronic pain syndrome 03/20/2024   Chronic urticaria 09/10/2019   Constipation    Diabetes mellitus with polyneuropathy (HCC) 07/17/2003   Fatigue    Fibromuscular dysplasia    Generalized headaches    GERD (gastroesophageal reflux disease)    Hemorrhoids    Hiatal hernia    Hypertension    Idiopathic scoliosis 07/30/2002   Lumbar facet arthropathy 03/20/2024   Lumbosacral radiculitis 12/11/2014   Neuropathy    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   a.) presurgical PCR (+) 06/13/2024 prior to THORACIC LAMINECTOMY FOR SCS IMPLANTATION   Palpitations    Pneumonia    Rectal bleeding    Rectal pain    Spinal stenosis 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 03/20/2024    Past Surgical History:  Procedure Laterality Date   Basal cell cancer  removal     colonoscopy  2010   EXCISION/RELEASE BURSA HIP Right 12/23/2022   Procedure: OPEN EXCISION OF RIGHT TROCHANTERIC BURSA;  Surgeon: Edie Norleen PARAS, MD;  Location: [facility];  Service: Orthopedics;  Laterality: Right;   EXCISIONAL HEMORRHOIDECTOMY  03/2012   IR ANGIO EXTERNAL CAROTID SEL EXT CAROTID BILAT MOD SED  09/04/2021   IR ANGIO INTRA EXTRACRAN SEL INTERNAL CAROTID BILAT MOD SED  09/04/2021   IR ANGIO VERTEBRAL SEL SUBCLAVIAN INNOMINATE UNI L MOD SED  09/04/2021    IR ANGIO VERTEBRAL SEL VERTEBRAL UNI R MOD SED  09/04/2021   IR US  GUIDE VASC ACCESS RIGHT  09/04/2021   IRRIGATION AND DEBRIDEMENT FOOT Right 08/17/2024   Procedure: IRRIGATION AND DEBRIDEMENT FOOT;  Surgeon: [name], DPM;  Location: [facility];  Service: Orthopedics/Podiatry;  Laterality: Right;   LUMBAR WOUND DEBRIDEMENT N/A 07/22/2024   Procedure: Wound Washout, IPG site;  Surgeon: Claudene Penne ORN, MD;  Location: [facility];  Service: Neurosurgery;  Laterality: N/A;   right and left eye cataract surgery  2006, 2008   right in 2006 and left 2008   THORACIC LAMINECTOMY FOR SPINAL CORD STIMULATOR N/A 06/22/2024   Procedure: THORACIC LAMINECTOMY FOR SPINAL CORD STIMULATOR;  Surgeon: Clois Fret, MD;  Location: [facility];  Service: Neurosurgery;  Laterality: N/A;  THORACIC LAMINECTOMY FOR SPINAL CORD STIMULATOR PLACEMENT   TRANSMETATARSAL AMPUTATION Right 08/17/2024   Procedure: AMPUTATION, PARTIAL FIFTH METATARSAL;  Surgeon: [name], DPM;  Location: [facility];  Service: Orthopedics/Podiatry;  Laterality: Right;   TUBAL LIGATION  1995    Allergies  Allergen Reactions   Lisinopril Hives, Itching, Swelling and Rash    Angioedema    RT foot 08/31/2024  Objective/Physical Exam Incision well coapted with staples and sutures intact.  No drainage noted today.  Overall significant improvement  Radiographic Exam RT foot 08/31/2024:  Clean osteotomy at the proximal third  of the fifth metatarsal.  Absence of the fifth metatarsal tubercle noted.  No gas within the tissue.  No osseous erosions concerning for residual osteomyelitis  Assessment: 1. s/p excision of fifth metatarsal tubercle right. DOS: 08/17/2024.  Dr. Marolyn Honour.  Inpatient.  [facility]   Plan of Care:  -Patient was evaluated.  Overall significant improvement - Begin weightbearing RLE postop shoe as instructed - Finish the doxycycline  100 mg twice daily x 10 days prescribed last visit on 08/31/2024  until completed -Return to clinic 2 weeks    Thresa EMERSON Sar, DPM [facility]  Dr. Thresa EMERSON Sar, DPM    [address] [place], [place] [address]                Office [phone]  Fax [phone]

## 2024-09-21 ENCOUNTER — Encounter: Payer: Self-pay | Admitting: Podiatry

## 2024-09-21 ENCOUNTER — Ambulatory Visit (INDEPENDENT_AMBULATORY_CARE_PROVIDER_SITE_OTHER): Admitting: Podiatry

## 2024-09-21 VITALS — Ht 68.0 in | Wt 241.0 lb

## 2024-09-21 DIAGNOSIS — E08621 Diabetes mellitus due to underlying condition with foot ulcer: Secondary | ICD-10-CM | POA: Diagnosis not present

## 2024-09-21 DIAGNOSIS — L97412 Non-pressure chronic ulcer of right heel and midfoot with fat layer exposed: Secondary | ICD-10-CM

## 2024-09-21 NOTE — Progress Notes (Signed)
 Chief Complaint  Patient presents with   Routine Post Op    Pt is here to f/u on right foot, she states everything is going well, hoping to get the stiches taken out today, has a question regarding left foot.    Subjective:  Patient presents today status post partial resection of the fifth metatarsal tubercle right foot secondary to osteomyelitis.  DOS: 08/17/2024.  Inpatient [facility] hospital.  Dr. Marolyn Honour.  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 pt stated on around October of 2022   Angioedema    Arterial fibromuscular dysplasia 07/08/2015   Arthritis    Cancer (HCC)    Carotid stenosis 12/16/2017   Chronic kidney disease    Chronic pain syndrome 03/20/2024   Chronic urticaria 09/10/2019   Constipation    Diabetes mellitus with polyneuropathy (HCC) 07/17/2003   Fatigue    Fibromuscular dysplasia    Generalized headaches    GERD (gastroesophageal reflux disease)    Hemorrhoids    Hiatal hernia    Hypertension    Idiopathic scoliosis 07/30/2002   Lumbar facet arthropathy 03/20/2024   Lumbosacral radiculitis 12/11/2014   Neuropathy    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   a.) presurgical PCR (+) 06/13/2024 prior to THORACIC LAMINECTOMY FOR SCS IMPLANTATION   Palpitations    Pneumonia    Rectal bleeding    Rectal pain    Spinal stenosis 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 03/20/2024    Past Surgical History:  Procedure Laterality Date   Basal cell cancer  removal     colonoscopy  2010   EXCISION/RELEASE BURSA HIP Right 12/23/2022   Procedure: OPEN EXCISION OF RIGHT TROCHANTERIC BURSA;  Surgeon: Edie Norleen PARAS, MD;  Location: [facility];  Service: Orthopedics;  Laterality: Right;   EXCISIONAL HEMORRHOIDECTOMY  03/2012   IR ANGIO EXTERNAL CAROTID SEL EXT CAROTID BILAT MOD SED  09/04/2021   IR ANGIO INTRA EXTRACRAN SEL INTERNAL CAROTID BILAT MOD SED  09/04/2021   IR ANGIO VERTEBRAL SEL  SUBCLAVIAN INNOMINATE UNI L MOD SED  09/04/2021   IR ANGIO VERTEBRAL SEL VERTEBRAL UNI R MOD SED  09/04/2021   IR US  GUIDE VASC ACCESS RIGHT  09/04/2021   IRRIGATION AND DEBRIDEMENT FOOT Right 08/17/2024   Procedure: IRRIGATION AND DEBRIDEMENT FOOT;  Surgeon: [name], DPM;  Location: [facility];  Service: Orthopedics/Podiatry;  Laterality: Right;   LUMBAR WOUND DEBRIDEMENT N/A 07/22/2024   Procedure: Wound Washout, IPG site;  Surgeon: Claudene Penne ORN, MD;  Location: [facility];  Service: Neurosurgery;  Laterality: N/A;   right and left eye cataract surgery  2006, 2008   right in 2006 and left 2008   THORACIC LAMINECTOMY FOR SPINAL CORD STIMULATOR N/A 06/22/2024   Procedure: THORACIC LAMINECTOMY FOR SPINAL CORD STIMULATOR;  Surgeon: Clois Fret, MD;  Location: [facility];  Service: Neurosurgery;  Laterality: N/A;  THORACIC LAMINECTOMY FOR SPINAL CORD STIMULATOR PLACEMENT   TRANSMETATARSAL AMPUTATION Right 08/17/2024   Procedure: AMPUTATION, PARTIAL FIFTH METATARSAL;  Surgeon: [name], DPM;  Location: [facility];  Service: Orthopedics/Podiatry;  Laterality: Right;   TUBAL LIGATION  1995    Allergies  Allergen Reactions   Lisinopril Hives, Itching, Swelling and Rash    Angioedema    RT foot 09/21/2024  RT foot wound 09/21/2024  Objective/Physical Exam Incision well coapted with exception of the central portion of the incision site which has an ulcer development.  Please see above noted photo.  There is no exposed bone.  It appears stable with minimal drainage.  Intermixed granular and fibrotic tissue noted.  No malodor.  No periwound erythema though be concerning for acute infection or cellulitis.  The wound measures approximately 1.3 x 0.6 x 0.3 cm  Radiographic Exam RT foot 08/31/2024:  Clean osteotomy at the proximal third of the fifth metatarsal.  Absence of the fifth metatarsal tubercle noted.  No gas within the tissue.  No osseous erosions concerning for  residual osteomyelitis  Assessment: 1. s/p excision of fifth metatarsal tubercle right. DOS: 08/17/2024.  Dr. Marolyn Honour.  Inpatient.  [facility] 2.  Ulcer lateral aspect of the right foot   Plan of Care:  -Patient was evaluated.  Remaining sutures removed -Medically necessary excisional debridement including subcutaneous tissue was performed today to the right foot wound.  Excisional debridement of the necrotic nonviable tissue down to healthier bleeding viable tissue was performed with postdebridement measurement same as pre- -Finished doxycycline  100 mg twice daily x 10 days prescribed on 08/31/2024.  I do not see any current need for additional antibiotics -Prisma with overlying Hydrofera Blue was applied with a light dressing. -Patient currently has [facility] coming to the house.  Continue.  Modify orders  -2x / week  -Apply moistened Prisma with overlying Hydrofera Blue and a light dressing -Okay for weightbearing right lower extremity in the postop shoe with the assistance of a walker or cane.  Home health physical therapy to assist with this -Return to clinic 4 weeks    Thresa EMERSON Sar, DPM [facility]  Dr. Thresa EMERSON Sar, DPM    [address] [place], [place] [address]                Office [phone]  Fax [phone]

## 2024-09-24 ENCOUNTER — Telehealth: Payer: Self-pay

## 2024-09-24 NOTE — Telephone Encounter (Addendum)
 Tiffany, with [facility] called regarding updated home health orders. She states they received the new orders,but they do not state what to clean the wound with.Please call Tiffany at [phone] [address]

## 2024-10-04 ENCOUNTER — Encounter: Payer: Self-pay | Admitting: Neurosurgery

## 2024-10-04 ENCOUNTER — Ambulatory Visit: Admitting: Neurosurgery

## 2024-10-04 VITALS — BP 126/76 | Temp 97.6°F | Ht 68.0 in | Wt 241.0 lb

## 2024-10-04 DIAGNOSIS — Z9689 Presence of other specified functional implants: Secondary | ICD-10-CM | POA: Diagnosis not present

## 2024-10-04 DIAGNOSIS — G894 Chronic pain syndrome: Secondary | ICD-10-CM

## 2024-10-04 NOTE — Progress Notes (Signed)
   REFERRING PHYSICIAN:  Auston Reyes BIRCH, Md [address] [facility] [place],  [place] [address]  DOS: 06/22/24  [employer] SCS placement 07/22/2024 - washout of pocket (superficial)  HISTORY OF PRESENT ILLNESS: KEILYNN MARANO is status post above surgery.   She is doing better.  Her wounds of healed.  She has no drainage.  She has no fevers or chills.  She had some issues with her foot and was nonambulatory for about a month.  During that time, she stopped using her stimulator.  She is working with the rep to reprogram.  She has had one of her metatarsals removed.  PHYSICAL EXAMINATION:  General: Patient is well developed, well nourished, calm, collected, and in no apparent distress.   NEUROLOGICAL:  General: In no acute distress.   Awake, alert, oriented to person, place, and time.  Pupils equal round and reactive to light.  Facial tone is symmetric.     Strength: Moves extremities well   Incisions c/d/i   ROS (Neurologic):  Negative except as noted above  IMAGING: Nothing new to review.   ASSESSMENT/PLAN:  NATASH BERMAN is doing well s/p above surgery and washout.  Her wound looks excellent.    She will work with the rep to reprogram her stimulator.  Will see her back as needed.  Reeves Daisy MD Department of neurosurgery

## 2024-10-08 ENCOUNTER — Encounter: Payer: Self-pay | Admitting: Podiatry

## 2024-10-09 ENCOUNTER — Ambulatory Visit (INDEPENDENT_AMBULATORY_CARE_PROVIDER_SITE_OTHER)

## 2024-10-09 ENCOUNTER — Ambulatory Visit: Admitting: Podiatry

## 2024-10-09 ENCOUNTER — Encounter: Payer: Self-pay | Admitting: Podiatry

## 2024-10-09 VITALS — Ht 68.0 in | Wt 241.0 lb

## 2024-10-09 DIAGNOSIS — L97412 Non-pressure chronic ulcer of right heel and midfoot with fat layer exposed: Secondary | ICD-10-CM

## 2024-10-09 DIAGNOSIS — E08621 Diabetes mellitus due to underlying condition with foot ulcer: Secondary | ICD-10-CM

## 2024-10-09 MED ORDER — CLINDAMYCIN HCL 300 MG PO CAPS: 300.0000 mg | ORAL_CAPSULE | Freq: Three times a day (TID) | ORAL | 0 refills | Status: AC

## 2024-10-09 NOTE — Progress Notes (Signed)
 Chief Complaint  Patient presents with   Diabetic Ulcer    Pt is here to f/u on right foot due to diabetic ulcer, she states that her home health nurse stated that the wound is getting bigger and has a smell, thinks she needs to be on a antibiotic and concern due to history of MRSA.    Subjective:  Patient presents today status post partial resection of the fifth metatarsal tubercle right foot secondary to osteomyelitis.  DOS: 08/17/2024.  Inpatient [facility] hospital.  Dr. Marolyn Honour.  Patient was initially noticing significant improvement however over the past week she has noticed increased size of the wound as well as some drainage.  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 pt stated on around October of 2022   Angioedema    Arterial fibromuscular dysplasia 07/08/2015   Arthritis    Cancer (HCC)    Carotid stenosis 12/16/2017   Chronic kidney disease    Chronic pain syndrome 03/20/2024   Chronic urticaria 09/10/2019   Constipation    Diabetes mellitus with polyneuropathy (HCC) 07/17/2003   Fatigue    Fibromuscular dysplasia    Generalized headaches    GERD (gastroesophageal reflux disease)    Hemorrhoids    Hiatal hernia    Hypertension    Idiopathic scoliosis 07/30/2002   Lumbar facet arthropathy 03/20/2024   Lumbosacral radiculitis 12/11/2014   Neuropathy    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   a.) presurgical PCR (+) 06/13/2024 prior to THORACIC LAMINECTOMY FOR SCS IMPLANTATION   Palpitations    Pneumonia    Rectal bleeding    Rectal pain    Spinal stenosis 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 03/20/2024    Past Surgical History:  Procedure Laterality Date   Basal cell cancer  removal     colonoscopy  2010   EXCISION/RELEASE BURSA HIP Right 12/23/2022   Procedure: OPEN EXCISION OF RIGHT TROCHANTERIC BURSA;  Surgeon: Edie Norleen PARAS, MD;  Location: [facility];  Service: Orthopedics;   Laterality: Right;   EXCISIONAL HEMORRHOIDECTOMY  03/2012   IR ANGIO EXTERNAL CAROTID SEL EXT CAROTID BILAT MOD SED  09/04/2021   IR ANGIO INTRA EXTRACRAN SEL INTERNAL CAROTID BILAT MOD SED  09/04/2021   IR ANGIO VERTEBRAL SEL SUBCLAVIAN INNOMINATE UNI L MOD SED  09/04/2021   IR ANGIO VERTEBRAL SEL VERTEBRAL UNI R MOD SED  09/04/2021   IR US  GUIDE VASC ACCESS RIGHT  09/04/2021   IRRIGATION AND DEBRIDEMENT FOOT Right 08/17/2024   Procedure: IRRIGATION AND DEBRIDEMENT FOOT;  Surgeon: [name], DPM;  Location: [facility];  Service: Orthopedics/Podiatry;  Laterality: Right;   LUMBAR WOUND DEBRIDEMENT N/A 07/22/2024   Procedure: Wound Washout, IPG site;  Surgeon: Claudene Penne ORN, MD;  Location: [facility];  Service: Neurosurgery;  Laterality: N/A;   right and left eye cataract surgery  2006, 2008   right in 2006 and left 2008   THORACIC LAMINECTOMY FOR SPINAL CORD STIMULATOR N/A 06/22/2024   Procedure: THORACIC LAMINECTOMY FOR SPINAL CORD STIMULATOR;  Surgeon: Clois Fret, MD;  Location: [facility];  Service: Neurosurgery;  Laterality: N/A;  THORACIC LAMINECTOMY FOR SPINAL CORD STIMULATOR PLACEMENT   TRANSMETATARSAL AMPUTATION Right 08/17/2024   Procedure: AMPUTATION, PARTIAL FIFTH METATARSAL;  Surgeon: [name], DPM;  Location: [facility];  Service: Orthopedics/Podiatry;  Laterality: Right;   TUBAL LIGATION  1995    Allergies  Allergen Reactions   Lisinopril  Hives, Itching, Swelling and Rash    Angioedema    RT foot 09/21/2024  RT foot wound 09/21/2024   RT foot 10/09/2024  RT foot 10/09/2024  Objective/Physical Exam Ulcer noted to the central portion of the incision site measuring approximately 2.0 x 2.0 x 0.3 cm.  No exposed bone.  Fibrotic wound base.  Serous drainage noted.  No malodor.  Periwound is intact  Radiographic Exam RT foot 08/31/2024:  Clean osteotomy at the proximal third of the fifth metatarsal.  Absence of the fifth metatarsal tubercle noted.   No gas within the tissue.  No osseous erosions concerning for residual osteomyelitis  Assessment: 1. s/p excision of fifth metatarsal tubercle right. DOS: 08/17/2024.  Dr. Marolyn Honour.  Inpatient.  [facility] 2.  Ulcer lateral aspect of the right foot   Plan of Care:  -Patient was evaluated.   -Medically necessary excisional debridement including subcutaneous tissue was performed today to the right foot wound.  Excisional debridement of the necrotic nonviable tissue down to healthier bleeding viable tissue was performed with postdebridement measurement same as pre- -Finished doxycycline  100 mg twice daily x 10 days prescribed on 08/31/2024.  -Cultures taken and sent to pathology for culture and sensitivity -Prescription for clindamycin  300 mg TID x 14 days based on most recent cultures and sensitivities -Discontinue Prisma with overlying Hydrofera Blue.  Aquacel Ag with overlying bandage.  Supplies provided -Patient currently has [facility] coming to the house.  Continue.  -Okay for weightbearing right lower extremity in the postop shoe with the assistance of a walker or cane.  Home health physical therapy to assist with this -Return to clinic 2 weeks    Thresa EMERSON Sar, DPM [facility]  Dr. Thresa EMERSON Sar, DPM    [address] [place], [place] [address]                Office [phone]  Fax [phone]

## 2024-10-10 NOTE — Addendum Note (Signed)
 Addended by: NICHOLAUS MARJORIE SAILOR on: 10/10/2024 08:18 AM   Modules accepted: Orders

## 2024-10-14 LAB — CULTURE,AEROBIC BACTERIA WITH GRAM STAIN
MICRO NUMBER:: 17342146
SPECIMEN QUALITY:: ADEQUATE

## 2024-10-19 ENCOUNTER — Ambulatory Visit: Admitting: Podiatry

## 2024-10-23 ENCOUNTER — Encounter: Payer: Self-pay | Admitting: Podiatry

## 2024-10-23 ENCOUNTER — Ambulatory Visit: Admitting: Podiatry

## 2024-10-23 VITALS — Ht 68.0 in | Wt 241.0 lb

## 2024-10-23 DIAGNOSIS — L97412 Non-pressure chronic ulcer of right heel and midfoot with fat layer exposed: Secondary | ICD-10-CM

## 2024-10-23 DIAGNOSIS — E08621 Diabetes mellitus due to underlying condition with foot ulcer: Secondary | ICD-10-CM | POA: Diagnosis not present

## 2024-10-23 MED ORDER — CIPROFLOXACIN HCL 500 MG PO TABS: 500.0000 mg | ORAL_TABLET | Freq: Two times a day (BID) | ORAL | 0 refills | Status: AC

## 2024-10-23 NOTE — Progress Notes (Signed)
 "  No chief complaint on file.   Subjective:  Patient presents today status post partial resection of the fifth metatarsal tubercle right foot secondary to osteomyelitis.  DOS: 08/17/2024.  Inpatient [facility] hospital.  Dr. Marolyn Honour.  Patient was initially noticing significant improvement however over the past week she has noticed increased size of the wound as well as some drainage.  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 pt stated on around October of 2022   Angioedema    Arterial fibromuscular dysplasia 07/08/2015   Arthritis    Cancer (HCC)    Carotid stenosis 12/16/2017   Chronic kidney disease    Chronic pain syndrome 03/20/2024   Chronic urticaria 09/10/2019   Constipation    Diabetes mellitus with polyneuropathy (HCC) 07/17/2003   Fatigue    Fibromuscular dysplasia    Generalized headaches    GERD (gastroesophageal reflux disease)    Hemorrhoids    Hiatal hernia    Hypertension    Idiopathic scoliosis 07/30/2002   Lumbar facet arthropathy 03/20/2024   Lumbosacral radiculitis 12/11/2014   Neuropathy    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   a.) presurgical PCR (+) 06/13/2024 prior to THORACIC LAMINECTOMY FOR SCS IMPLANTATION   Palpitations    Pneumonia    Rectal bleeding    Rectal pain    Spinal stenosis 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 03/20/2024    Past Surgical History:  Procedure Laterality Date   Basal cell cancer  removal     colonoscopy  2010   EXCISION/RELEASE BURSA HIP Right 12/23/2022   Procedure: OPEN EXCISION OF RIGHT TROCHANTERIC BURSA;  Surgeon: Edie Norleen PARAS, MD;  Location: [facility] ORS;  Service: Orthopedics;  Laterality: Right;   EXCISIONAL HEMORRHOIDECTOMY  03/2012   IR ANGIO EXTERNAL CAROTID SEL EXT CAROTID BILAT MOD SED  09/04/2021   IR ANGIO INTRA EXTRACRAN SEL INTERNAL CAROTID BILAT MOD SED  09/04/2021   IR ANGIO VERTEBRAL SEL SUBCLAVIAN INNOMINATE UNI L  MOD SED  09/04/2021   IR ANGIO VERTEBRAL SEL VERTEBRAL UNI R MOD SED  09/04/2021   IR US  GUIDE VASC ACCESS RIGHT  09/04/2021   IRRIGATION AND DEBRIDEMENT FOOT Right 08/17/2024   Procedure: IRRIGATION AND DEBRIDEMENT FOOT;  Surgeon: [name], DPM;  Location: [facility] ORS;  Service: Orthopedics/Podiatry;  Laterality: Right;   LUMBAR WOUND DEBRIDEMENT N/A 07/22/2024   Procedure: Wound Washout, IPG site;  Surgeon: Claudene Penne ORN, MD;  Location: [facility] ORS;  Service: Neurosurgery;  Laterality: N/A;   right and left eye cataract surgery  2006, 2008   right in 2006 and left 2008   THORACIC LAMINECTOMY FOR SPINAL CORD STIMULATOR N/A 06/22/2024   Procedure: THORACIC LAMINECTOMY FOR SPINAL CORD STIMULATOR;  Surgeon: Clois Fret, MD;  Location: [facility] ORS;  Service: Neurosurgery;  Laterality: N/A;  THORACIC LAMINECTOMY FOR SPINAL CORD STIMULATOR PLACEMENT   TRANSMETATARSAL AMPUTATION Right 08/17/2024   Procedure: AMPUTATION, PARTIAL FIFTH METATARSAL;  Surgeon: [name], DPM;  Location: [facility] OR[facility];  Service: Orthopedics/Podiatry;  Laterality: Right;   TUBAL LIGATION  1995    Allergies  Allergen Reactions   Lisinopril Hives, Itching, Swelling and Rash    Angioedema    RT foot 09/21/2024  RT foot wound 09/21/2024   RT foot 10/09/2024  RT foot 10/09/2024  Objective/Physical Exam Stable.  Mostly unchanged.  Ulcer noted to the central portion of the incision site measuring approximately 2.0 x 2.0 x  0.3 cm.  No exposed bone.  Fibrotic wound base.  Serous drainage noted.  No malodor.  Periwound is intact  Radiographic Exam RT foot 08/31/2024:  Clean osteotomy at the proximal third of the fifth metatarsal.  Absence of the fifth metatarsal tubercle noted.  No gas within the tissue.  No osseous erosions concerning for residual osteomyelitis  Assessment: 1. s/p excision of fifth metatarsal tubercle right. DOS: 08/17/2024.  Dr. Marolyn Honour.  Inpatient.  [facility] 2.  Ulcer lateral  aspect of the right foot   Plan of Care:  -Patient was evaluated.   -Medically necessary excisional debridement including subcutaneous tissue was performed today to the right foot wound.  Excisional debridement of the necrotic nonviable tissue down to healthier bleeding viable tissue was performed with postdebridement measurement same as pre- -Finished doxycycline  100 mg twice daily x 10 days prescribed on 08/31/2024.  Also completed clindamycin  300 mg TID x 14 days prescribed on 10/09/2024. -Based on cultures taken last visit prescription for ciprofloxacin  500 mg twice daily x 10 days -Continue aquacel ag daily -Return to clinic 2 weeks    Thresa EMERSON Sar, DPM [facility]  Dr. Thresa EMERSON Sar, DPM    [address] [place], [place] [address]                Office [phone]  Fax [phone]        "

## 2024-11-09 ENCOUNTER — Ambulatory Visit: Admitting: Podiatry

## 2024-11-09 ENCOUNTER — Ambulatory Visit

## 2024-11-09 ENCOUNTER — Encounter: Payer: Self-pay | Admitting: Podiatry

## 2024-11-09 VITALS — Ht 68.0 in | Wt 241.0 lb

## 2024-11-09 DIAGNOSIS — L97412 Non-pressure chronic ulcer of right heel and midfoot with fat layer exposed: Secondary | ICD-10-CM

## 2024-11-09 DIAGNOSIS — M7671 Peroneal tendinitis, right leg: Secondary | ICD-10-CM

## 2024-11-09 DIAGNOSIS — E08621 Diabetes mellitus due to underlying condition with foot ulcer: Secondary | ICD-10-CM

## 2024-11-09 DIAGNOSIS — L84 Corns and callosities: Secondary | ICD-10-CM

## 2024-11-09 MED ORDER — CIPROFLOXACIN HCL 500 MG PO TABS: 500.0000 mg | ORAL_TABLET | Freq: Two times a day (BID) | ORAL | 0 refills | Status: DC

## 2024-11-09 MED ORDER — CIPROFLOXACIN HCL 500 MG PO TABS: 500.0000 mg | ORAL_TABLET | Freq: Two times a day (BID) | ORAL | 0 refills | Status: AC

## 2024-11-09 NOTE — Addendum Note (Signed)
 Addended by: JANIT THRESA HERO on: 11/09/2024 11:41 AM   Modules accepted: Orders

## 2024-11-09 NOTE — Progress Notes (Signed)
 "  Chief Complaint  Patient presents with   Diabetic Ulcer    Subjective:  Patient presents today status post partial resection of the fifth metatarsal tubercle right foot secondary to osteomyelitis.  DOS: 08/17/2024.  Inpatient [facility] hospital.  Dr. Marolyn Honour.  Patient was initially noticing significant improvement however over the past week she has noticed increased size of the wound as well as some drainage.  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 pt stated on around October of 2022   Angioedema    Arterial fibromuscular dysplasia 07/08/2015   Arthritis    Cancer (HCC)    Carotid stenosis 12/16/2017   Chronic kidney disease    Chronic pain syndrome 03/20/2024   Chronic urticaria 09/10/2019   Constipation    Diabetes mellitus with polyneuropathy (HCC) 07/17/2003   Fatigue    Fibromuscular dysplasia    Generalized headaches    GERD (gastroesophageal reflux disease)    Hemorrhoids    Hiatal hernia    Hypertension    Idiopathic scoliosis 07/30/2002   Lumbar facet arthropathy 03/20/2024   Lumbosacral radiculitis 12/11/2014   Neuropathy    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   a.) presurgical PCR (+) 06/13/2024 prior to THORACIC LAMINECTOMY FOR SCS IMPLANTATION   Palpitations    Pneumonia    Rectal bleeding    Rectal pain    Spinal stenosis 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 03/20/2024    Past Surgical History:  Procedure Laterality Date   Basal cell cancer  removal     colonoscopy  2010   EXCISION/RELEASE BURSA HIP Right 12/23/2022   Procedure: OPEN EXCISION OF RIGHT TROCHANTERIC BURSA;  Surgeon: Edie Norleen PARAS, MD;  Location: [facility];  Service: Orthopedics;  Laterality: Right;   EXCISIONAL HEMORRHOIDECTOMY  03/2012   IR ANGIO EXTERNAL CAROTID SEL EXT CAROTID BILAT MOD SED  09/04/2021   IR ANGIO INTRA EXTRACRAN SEL INTERNAL CAROTID BILAT MOD SED  09/04/2021   IR ANGIO VERTEBRAL SEL  SUBCLAVIAN INNOMINATE UNI L MOD SED  09/04/2021   IR ANGIO VERTEBRAL SEL VERTEBRAL UNI R MOD SED  09/04/2021   IR US  GUIDE VASC ACCESS RIGHT  09/04/2021   IRRIGATION AND DEBRIDEMENT FOOT Right 08/17/2024   Procedure: IRRIGATION AND DEBRIDEMENT FOOT;  Surgeon: [name], DPM;  Location: [facility] OR[facility];  Service: Orthopedics/Podiatry;  Laterality: Right;   LUMBAR WOUND DEBRIDEMENT N/A 07/22/2024   Procedure: Wound Washout, IPG site;  Surgeon: Claudene Penne ORN, MD;  Location: [facility] OR[facility];  Service: Neurosurgery;  Laterality: N/A;   right and left eye cataract surgery  2006, 2008   right in 2006 and left 2008   THORACIC LAMINECTOMY FOR SPINAL CORD STIMULATOR N/A 06/22/2024   Procedure: THORACIC LAMINECTOMY FOR SPINAL CORD STIMULATOR;  Surgeon: Clois Fret, MD;  Location: [facility] OR[facility];  Service: Neurosurgery;  Laterality: N/A;  THORACIC LAMINECTOMY FOR SPINAL CORD STIMULATOR PLACEMENT   TRANSMETATARSAL AMPUTATION Right 08/17/2024   Procedure: AMPUTATION, PARTIAL FIFTH METATARSAL;  Surgeon: [name], DPM;  Location: [facility];  Service: Orthopedics/Podiatry;  Laterality: Right;   TUBAL LIGATION  1995    Allergies  Allergen Reactions   Lisinopril Hives, Itching, Swelling and Rash    Angioedema    RT foot 09/21/2024  RT foot wound 09/21/2024   RT foot 10/09/2024  RT foot 10/09/2024   RT foot 11/09/2024  Objective/Physical Exam Stable.  Overall significant improvement.  The drainage appears to  be mostly resolved.  The wound measures approximately 1.8 x 1.2 x 0.2 cm.  No exposed bone.  Granular tissue with intermixed fibrotic wound base.  No drainage noted.  No malodor.  Periwound is intact  Radiographic Exam RT foot 08/31/2024:  Clean osteotomy at the proximal third of the fifth metatarsal.  Absence of the fifth metatarsal tubercle noted.  No gas within the tissue.  No osseous erosions concerning for residual osteomyelitis  Radiographic exam LT foot 11/09/2024: No erosions  or concern radiographically for underlying osteomyelitis.  Assessment: 1. s/p excision of fifth metatarsal tubercle right. DOS: 08/17/2024.  Dr. Marolyn Honour.  Inpatient.  [facility] 2.  Ulcer lateral aspect of the right foot 3.  Preulcerative callus fifth metatarsal tubercle left foot   Plan of Care:  -Patient was evaluated.   -Medically necessary excisional debridement including subcutaneous tissue was performed today to the right foot wound.  Excisional debridement of the necrotic nonviable tissue down to healthier bleeding viable tissue was performed with postdebridement measurement same as pre- -Finished doxycycline  100 mg twice daily x 10 days prescribed on 08/31/2024.  Also completed clindamycin  300 mg TID x 14 days prescribed on 10/09/2024. - Patient tolerated the ciprofloxacin  well.  Refill provided to ensure adequate eradication of the underlying infection.  Based on cultures taken refill prescription for ciprofloxacin  500 mg twice daily x 10 days -Continue aquacel ag daily.  Supplies provided -Okay to discontinue home [facility] -Return to clinic 2 weeks    Melody Brooks, DPM [facility]  Dr. Thresa EMERSON Brooks, DPM    [address] [place], [place] [address]                Office [phone]  Fax [phone]        "

## 2024-11-15 ENCOUNTER — Other Ambulatory Visit: Payer: Self-pay

## 2024-11-15 DIAGNOSIS — I999 Unspecified disorder of circulatory system: Secondary | ICD-10-CM

## 2024-11-15 DIAGNOSIS — E1142 Type 2 diabetes mellitus with diabetic polyneuropathy: Secondary | ICD-10-CM

## 2024-11-15 DIAGNOSIS — L97412 Non-pressure chronic ulcer of right heel and midfoot with fat layer exposed: Secondary | ICD-10-CM

## 2024-11-15 DIAGNOSIS — L97512 Non-pressure chronic ulcer of other part of right foot with fat layer exposed: Secondary | ICD-10-CM

## 2024-11-23 ENCOUNTER — Ambulatory Visit: Admitting: Podiatry

## 2024-11-23 NOTE — Progress Notes (Unsigned)
 "  Chief Complaint  Patient presents with   Diabetic Ulcer    Pt is here to f/u right foot due to ulcer, states everything is going well, has no complaints.    Subjective:  Patient presents today status post partial resection of the fifth metatarsal tubercle right foot secondary to osteomyelitis.  DOS: 08/17/2024.  Inpatient [facility] hospital.  Dr. Marolyn Honour.  Patient was initially noticing significant improvement however over the past week she has noticed increased size of the wound as well as some drainage.  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 pt stated on around October of 2022   Angioedema    Arterial fibromuscular dysplasia 07/08/2015   Arthritis    Cancer (HCC)    Carotid stenosis 12/16/2017   Chronic kidney disease    Chronic pain syndrome 03/20/2024   Chronic urticaria 09/10/2019   Constipation    Diabetes mellitus with polyneuropathy (HCC) 07/17/2003   Fatigue    Fibromuscular dysplasia    Generalized headaches    GERD (gastroesophageal reflux disease)    Hemorrhoids    Hiatal hernia    Hypertension    Idiopathic scoliosis 07/30/2002   Lumbar facet arthropathy 03/20/2024   Lumbosacral radiculitis 12/11/2014   Neuropathy    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   a.) presurgical PCR (+) 06/13/2024 prior to THORACIC LAMINECTOMY FOR SCS IMPLANTATION   Palpitations    Pneumonia    Rectal bleeding    Rectal pain    Spinal stenosis 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 03/20/2024    Past Surgical History:  Procedure Laterality Date   Basal cell cancer  removal     colonoscopy  2010   EXCISION/RELEASE BURSA HIP Right 12/23/2022   Procedure: OPEN EXCISION OF RIGHT TROCHANTERIC BURSA;  Surgeon: Edie Norleen PARAS, MD;  Location: [facility] OR[facility];  Service: Orthopedics;  Laterality: Right;   EXCISIONAL HEMORRHOIDECTOMY  03/2012   IR ANGIO EXTERNAL CAROTID SEL EXT CAROTID BILAT MOD SED  09/04/2021    IR ANGIO INTRA EXTRACRAN SEL INTERNAL CAROTID BILAT MOD SED  09/04/2021   IR ANGIO VERTEBRAL SEL SUBCLAVIAN INNOMINATE UNI L MOD SED  09/04/2021   IR ANGIO VERTEBRAL SEL VERTEBRAL UNI R MOD SED  09/04/2021   IR US  GUIDE VASC ACCESS RIGHT  09/04/2021   IRRIGATION AND DEBRIDEMENT FOOT Right 08/17/2024   Procedure: IRRIGATION AND DEBRIDEMENT FOOT;  Surgeon: [name], DPM;  Location: [facility] OR[facility];  Service: Orthopedics/Podiatry;  Laterality: Right;   LUMBAR WOUND DEBRIDEMENT N/A 07/22/2024   Procedure: Wound Washout, IPG site;  Surgeon: Claudene Penne ORN, MD;  Location: [facility] OR[facility];  Service: Neurosurgery;  Laterality: N/A;   right and left eye cataract surgery  2006, 2008   right in 2006 and left 2008   THORACIC LAMINECTOMY FOR SPINAL CORD STIMULATOR N/A 06/22/2024   Procedure: THORACIC LAMINECTOMY FOR SPINAL CORD STIMULATOR;  Surgeon: Clois Fret, MD;  Location: [facility] OR[facility];  Service: Neurosurgery;  Laterality: N/A;  THORACIC LAMINECTOMY FOR SPINAL CORD STIMULATOR PLACEMENT   TRANSMETATARSAL AMPUTATION Right 08/17/2024   Procedure: AMPUTATION, PARTIAL FIFTH METATARSAL;  Surgeon: [name], DPM;  Location: [facility];  Service: Orthopedics/Podiatry;  Laterality: Right;   TUBAL LIGATION  1995    Allergies  Allergen Reactions   Lisinopril Hives, Itching, Swelling and Rash    Angioedema    RT foot 09/21/2024  RT foot wound 09/21/2024   RT foot 10/09/2024  RT foot 10/09/2024   RT foot 11/09/2024  Objective/Physical Exam Stable.  Overall significant improvement.  The drainage appears to be mostly resolved.  The wound measures approximately 1.8 x 1.2 x 0.2 cm.  No exposed bone.  Granular tissue with intermixed fibrotic wound base.  No drainage noted.  No malodor.  Periwound is intact  Radiographic Exam RT foot 08/31/2024:  Clean osteotomy at the proximal third of the fifth metatarsal.  Absence of the fifth metatarsal tubercle noted.  No gas within the tissue.  No osseous  erosions concerning for residual osteomyelitis  Radiographic exam LT foot 11/09/2024: No erosions or concern radiographically for underlying osteomyelitis.  Assessment: 1. s/p excision of fifth metatarsal tubercle right. DOS: 08/17/2024.  Dr. Marolyn Honour.  Inpatient.  [facility] 2.  Ulcer lateral aspect of the right foot 3.  Preulcerative callus fifth metatarsal tubercle left foot   Plan of Care:  -Patient was evaluated.   -Medically necessary excisional debridement including subcutaneous tissue was performed today to the right foot wound.  Excisional debridement of the necrotic nonviable tissue down to healthier bleeding viable tissue was performed with postdebridement measurement same as pre- -Finished doxycycline  100 mg twice daily x 10 days prescribed on 08/31/2024.  Also completed clindamycin  300 mg TID x 14 days prescribed on 10/09/2024. - Patient tolerated the ciprofloxacin  well.  Refill provided to ensure adequate eradication of the underlying infection.  Based on cultures taken refill prescription for ciprofloxacin  500 mg twice daily x 10 days -Continue aquacel ag daily.  Supplies provided -Okay to discontinue [facility] -Return to clinic 2 weeks    Thresa EMERSON Sar, DPM [facility]  Dr. Thresa EMERSON Sar, DPM    [address] [place], [place] [address]                Office [phone]  Fax [phone]        "

## 2024-12-14 ENCOUNTER — Ambulatory Visit: Admitting: Podiatry

## 2025-07-19 ENCOUNTER — Ambulatory Visit (INDEPENDENT_AMBULATORY_CARE_PROVIDER_SITE_OTHER): Admitting: Vascular Surgery

## 2025-07-19 ENCOUNTER — Encounter (INDEPENDENT_AMBULATORY_CARE_PROVIDER_SITE_OTHER)
